# Patient Record
Sex: Male | Born: 1960 | Hispanic: Yes | Smoking: Current every day smoker
Health system: Southern US, Community
[De-identification: ages and names within clinical notes are randomized; demographics above are authoritative.]

## PROBLEM LIST (undated history)

## (undated) DIAGNOSIS — F1010 Alcohol abuse, uncomplicated: Secondary | ICD-10-CM

## (undated) DIAGNOSIS — I2699 Other pulmonary embolism without acute cor pulmonale: Secondary | ICD-10-CM

## (undated) DIAGNOSIS — K7460 Unspecified cirrhosis of liver: Secondary | ICD-10-CM

## (undated) DIAGNOSIS — K766 Portal hypertension: Secondary | ICD-10-CM

## (undated) DIAGNOSIS — K652 Spontaneous bacterial peritonitis: Secondary | ICD-10-CM

## (undated) DIAGNOSIS — I509 Heart failure, unspecified: Secondary | ICD-10-CM

## (undated) DIAGNOSIS — A419 Sepsis, unspecified organism: Secondary | ICD-10-CM

## (undated) DIAGNOSIS — H269 Unspecified cataract: Secondary | ICD-10-CM

## (undated) DIAGNOSIS — R188 Other ascites: Secondary | ICD-10-CM

## (undated) DIAGNOSIS — L0390 Cellulitis, unspecified: Secondary | ICD-10-CM

## (undated) DIAGNOSIS — K7031 Alcoholic cirrhosis of liver with ascites: Secondary | ICD-10-CM

## (undated) DIAGNOSIS — E119 Type 2 diabetes mellitus without complications: Secondary | ICD-10-CM

## (undated) HISTORY — DX: Alcoholic cirrhosis of liver with ascites: K70.31

## (undated) HISTORY — DX: Type 2 diabetes mellitus without complications: E11.9

## (undated) HISTORY — PX: EYE SURGERY: SHX253

## (undated) HISTORY — DX: Unspecified cirrhosis of liver: K74.60

## (undated) HISTORY — PX: HERNIA REPAIR: SHX51

## (undated) HISTORY — DX: Other pulmonary embolism without acute cor pulmonale: I26.99

## (undated) HISTORY — PX: CATARACT EXTRACTION: SUR2

## (undated) HISTORY — DX: Heart failure, unspecified: I50.9

---

## 2005-11-10 ENCOUNTER — Inpatient Hospital Stay: Payer: Self-pay | Admitting: Unknown Physician Specialty

## 2014-12-29 DIAGNOSIS — Z8614 Personal history of Methicillin resistant Staphylococcus aureus infection: Secondary | ICD-10-CM

## 2014-12-29 HISTORY — DX: Personal history of Methicillin resistant Staphylococcus aureus infection: Z86.14

## 2015-03-06 ENCOUNTER — Ambulatory Visit: Payer: Self-pay | Admitting: Family Medicine

## 2015-08-27 DIAGNOSIS — D7589 Other specified diseases of blood and blood-forming organs: Secondary | ICD-10-CM | POA: Insufficient documentation

## 2018-09-28 DIAGNOSIS — S63259A Unspecified dislocation of unspecified finger, initial encounter: Secondary | ICD-10-CM | POA: Insufficient documentation

## 2019-12-30 DIAGNOSIS — J189 Pneumonia, unspecified organism: Secondary | ICD-10-CM

## 2019-12-30 HISTORY — DX: Pneumonia, unspecified organism: J18.9

## 2019-12-30 HISTORY — PX: PARACENTESIS: SHX844

## 2020-06-05 ENCOUNTER — Emergency Department: Payer: Self-pay

## 2020-06-05 ENCOUNTER — Inpatient Hospital Stay (HOSPITAL_COMMUNITY)
Admit: 2020-06-05 | Discharge: 2020-06-05 | Disposition: A | Discharge status: Home / Self Care | Payer: Self-pay | Attending: Internal Medicine | Admitting: Internal Medicine

## 2020-06-05 ENCOUNTER — Other Ambulatory Visit: Payer: Self-pay

## 2020-06-05 ENCOUNTER — Encounter: Payer: Self-pay | Admitting: Emergency Medicine

## 2020-06-05 ENCOUNTER — Inpatient Hospital Stay
Admission: EM | Admit: 2020-06-05 | Discharge: 2020-06-21 | DRG: 871 | Disposition: A | Discharge status: Home Health | Payer: Self-pay | Attending: Internal Medicine | Admitting: Internal Medicine

## 2020-06-05 ENCOUNTER — Inpatient Hospital Stay: Payer: Self-pay

## 2020-06-05 ENCOUNTER — Ambulatory Visit
Admission: EM | Admit: 2020-06-05 | Discharge: 2020-06-05 | Disposition: A | Discharge status: Other Acute Inpatient Hospital | Payer: PRIVATE HEALTH INSURANCE | Attending: Emergency Medicine | Admitting: Emergency Medicine

## 2020-06-05 ENCOUNTER — Encounter: Payer: Self-pay | Admitting: Internal Medicine

## 2020-06-05 DIAGNOSIS — A419 Sepsis, unspecified organism: Principal | ICD-10-CM | POA: Diagnosis present

## 2020-06-05 DIAGNOSIS — R0902 Hypoxemia: Secondary | ICD-10-CM

## 2020-06-05 DIAGNOSIS — R188 Other ascites: Secondary | ICD-10-CM

## 2020-06-05 DIAGNOSIS — E873 Alkalosis: Secondary | ICD-10-CM | POA: Diagnosis not present

## 2020-06-05 DIAGNOSIS — K7031 Alcoholic cirrhosis of liver with ascites: Secondary | ICD-10-CM | POA: Diagnosis present

## 2020-06-05 DIAGNOSIS — J440 Chronic obstructive pulmonary disease with acute lower respiratory infection: Secondary | ICD-10-CM | POA: Diagnosis present

## 2020-06-05 DIAGNOSIS — I5033 Acute on chronic diastolic (congestive) heart failure: Secondary | ICD-10-CM | POA: Diagnosis present

## 2020-06-05 DIAGNOSIS — K652 Spontaneous bacterial peritonitis: Secondary | ICD-10-CM | POA: Diagnosis present

## 2020-06-05 DIAGNOSIS — R0602 Shortness of breath: Secondary | ICD-10-CM

## 2020-06-05 DIAGNOSIS — K7681 Hepatopulmonary syndrome: Secondary | ICD-10-CM | POA: Diagnosis present

## 2020-06-05 DIAGNOSIS — R079 Chest pain, unspecified: Secondary | ICD-10-CM | POA: Diagnosis present

## 2020-06-05 DIAGNOSIS — R609 Edema, unspecified: Secondary | ICD-10-CM

## 2020-06-05 DIAGNOSIS — J9601 Acute respiratory failure with hypoxia: Secondary | ICD-10-CM

## 2020-06-05 DIAGNOSIS — D72829 Elevated white blood cell count, unspecified: Secondary | ICD-10-CM

## 2020-06-05 DIAGNOSIS — R600 Localized edema: Secondary | ICD-10-CM

## 2020-06-05 DIAGNOSIS — Z515 Encounter for palliative care: Secondary | ICD-10-CM | POA: Diagnosis not present

## 2020-06-05 DIAGNOSIS — L03115 Cellulitis of right lower limb: Secondary | ICD-10-CM

## 2020-06-05 DIAGNOSIS — M7989 Other specified soft tissue disorders: Secondary | ICD-10-CM

## 2020-06-05 DIAGNOSIS — E871 Hypo-osmolality and hyponatremia: Secondary | ICD-10-CM

## 2020-06-05 DIAGNOSIS — Z6833 Body mass index (BMI) 33.0-33.9, adult: Secondary | ICD-10-CM

## 2020-06-05 DIAGNOSIS — E8809 Other disorders of plasma-protein metabolism, not elsewhere classified: Secondary | ICD-10-CM | POA: Diagnosis present

## 2020-06-05 DIAGNOSIS — J9602 Acute respiratory failure with hypercapnia: Secondary | ICD-10-CM | POA: Diagnosis present

## 2020-06-05 DIAGNOSIS — J9600 Acute respiratory failure, unspecified whether with hypoxia or hypercapnia: Secondary | ICD-10-CM

## 2020-06-05 DIAGNOSIS — I2699 Other pulmonary embolism without acute cor pulmonale: Secondary | ICD-10-CM

## 2020-06-05 DIAGNOSIS — K7040 Alcoholic hepatic failure without coma: Secondary | ICD-10-CM | POA: Diagnosis present

## 2020-06-05 DIAGNOSIS — E876 Hypokalemia: Secondary | ICD-10-CM | POA: Diagnosis not present

## 2020-06-05 DIAGNOSIS — D649 Anemia, unspecified: Secondary | ICD-10-CM | POA: Diagnosis present

## 2020-06-05 DIAGNOSIS — Z66 Do not resuscitate: Secondary | ICD-10-CM | POA: Diagnosis present

## 2020-06-05 DIAGNOSIS — F1010 Alcohol abuse, uncomplicated: Secondary | ICD-10-CM | POA: Diagnosis present

## 2020-06-05 DIAGNOSIS — I110 Hypertensive heart disease with heart failure: Secondary | ICD-10-CM | POA: Diagnosis present

## 2020-06-05 DIAGNOSIS — L03116 Cellulitis of left lower limb: Secondary | ICD-10-CM

## 2020-06-05 DIAGNOSIS — Z20822 Contact with and (suspected) exposure to covid-19: Secondary | ICD-10-CM | POA: Diagnosis present

## 2020-06-05 DIAGNOSIS — R590 Localized enlarged lymph nodes: Secondary | ICD-10-CM | POA: Diagnosis present

## 2020-06-05 DIAGNOSIS — J189 Pneumonia, unspecified organism: Secondary | ICD-10-CM | POA: Diagnosis present

## 2020-06-05 DIAGNOSIS — K921 Melena: Secondary | ICD-10-CM | POA: Diagnosis not present

## 2020-06-05 DIAGNOSIS — K7460 Unspecified cirrhosis of liver: Secondary | ICD-10-CM

## 2020-06-05 DIAGNOSIS — F17210 Nicotine dependence, cigarettes, uncomplicated: Secondary | ICD-10-CM | POA: Diagnosis present

## 2020-06-05 DIAGNOSIS — E1165 Type 2 diabetes mellitus with hyperglycemia: Secondary | ICD-10-CM

## 2020-06-05 DIAGNOSIS — R778 Other specified abnormalities of plasma proteins: Secondary | ICD-10-CM

## 2020-06-05 DIAGNOSIS — J441 Chronic obstructive pulmonary disease with (acute) exacerbation: Secondary | ICD-10-CM | POA: Diagnosis present

## 2020-06-05 HISTORY — DX: Unspecified cataract: H26.9

## 2020-06-05 LAB — HEPATITIS PANEL, ACUTE
HCV Ab: NONREACTIVE
Hep A IgM: NONREACTIVE
Hep B C IgM: NONREACTIVE
Hepatitis B Surface Ag: NONREACTIVE

## 2020-06-05 LAB — CBC WITH DIFFERENTIAL/PLATELET
Abs Immature Granulocytes: 0.54 10*3/uL — ABNORMAL HIGH (ref 0.00–0.07)
Basophils Absolute: 0.1 10*3/uL (ref 0.0–0.1)
Basophils Relative: 0 %
Eosinophils Absolute: 0 10*3/uL (ref 0.0–0.5)
Eosinophils Relative: 0 %
HCT: 34.5 % — ABNORMAL LOW (ref 39.0–52.0)
Hemoglobin: 10.9 g/dL — ABNORMAL LOW (ref 13.0–17.0)
Immature Granulocytes: 2 %
Lymphocytes Relative: 1 %
Lymphs Abs: 0.3 10*3/uL — ABNORMAL LOW (ref 0.7–4.0)
MCH: 29.5 pg (ref 26.0–34.0)
MCHC: 31.6 g/dL (ref 30.0–36.0)
MCV: 93.2 fL (ref 80.0–100.0)
Monocytes Absolute: 1.7 10*3/uL — ABNORMAL HIGH (ref 0.1–1.0)
Monocytes Relative: 6 %
Neutro Abs: 29.2 10*3/uL — ABNORMAL HIGH (ref 1.7–7.7)
Neutrophils Relative %: 91 %
Platelets: 452 10*3/uL — ABNORMAL HIGH (ref 150–400)
RBC: 3.7 MIL/uL — ABNORMAL LOW (ref 4.22–5.81)
RDW: 14 % (ref 11.5–15.5)
Smear Review: NORMAL
WBC: 31.9 10*3/uL — ABNORMAL HIGH (ref 4.0–10.5)
nRBC: 0.2 % (ref 0.0–0.2)

## 2020-06-05 LAB — MAGNESIUM: Magnesium: 1.7 mg/dL (ref 1.7–2.4)

## 2020-06-05 LAB — C-REACTIVE PROTEIN: CRP: 7.1 mg/dL — ABNORMAL HIGH (ref <1.0)

## 2020-06-05 LAB — BLOOD GAS, VENOUS
Acid-Base Excess: 0.2 mmol/L (ref 0.0–2.0)
Bicarbonate: 26 mmol/L (ref 20.0–28.0)
O2 Saturation: 78.7 %
Patient temperature: 37
pCO2, Ven: 46 mmHg (ref 44.0–60.0)
pH, Ven: 7.36 (ref 7.250–7.430)
pO2, Ven: 45 mmHg (ref 32.0–45.0)

## 2020-06-05 LAB — PHOSPHORUS: Phosphorus: 2.4 mg/dL — ABNORMAL LOW (ref 2.5–4.6)

## 2020-06-05 LAB — TROPONIN I (HIGH SENSITIVITY)
Troponin I (High Sensitivity): 27 ng/L — ABNORMAL HIGH (ref <18)
Troponin I (High Sensitivity): 23 ng/L — ABNORMAL HIGH (ref <18)
Troponin I (High Sensitivity): 18 ng/L — ABNORMAL HIGH (ref <18)
Troponin I (High Sensitivity): 15 ng/L (ref <18)

## 2020-06-05 LAB — BASIC METABOLIC PANEL
Anion gap: 7 (ref 5–15)
BUN: 9 mg/dL (ref 6–20)
CO2: 25 mmol/L (ref 22–32)
Calcium: 6.8 mg/dL — ABNORMAL LOW (ref 8.9–10.3)
Chloride: 97 mmol/L — ABNORMAL LOW (ref 98–111)
Creatinine, Ser: 0.83 mg/dL (ref 0.61–1.24)
GFR calc Af Amer: >60 mL/min (ref >60)
GFR calc non Af Amer: >60 mL/min (ref >60)
Glucose, Bld: 183 mg/dL — ABNORMAL HIGH (ref 70–99)
Potassium: 4 mmol/L (ref 3.5–5.1)
Sodium: 129 mmol/L — ABNORMAL LOW (ref 135–145)

## 2020-06-05 LAB — HEPATIC FUNCTION PANEL
ALT: 22 U/L (ref 0–44)
AST: 25 U/L (ref 15–41)
Albumin: 1.7 g/dL — ABNORMAL LOW (ref 3.5–5.0)
Alkaline Phosphatase: 59 U/L (ref 38–126)
Bilirubin, Direct: 0.2 mg/dL (ref 0.0–0.2)
Indirect Bilirubin: 0.8 mg/dL (ref 0.3–0.9)
Total Bilirubin: 1 mg/dL (ref 0.3–1.2)
Total Protein: 5.7 g/dL — ABNORMAL LOW (ref 6.5–8.1)

## 2020-06-05 LAB — SEDIMENTATION RATE: Sed Rate: 60 mm/h — ABNORMAL HIGH (ref 0–20)

## 2020-06-05 LAB — ECHOCARDIOGRAM COMPLETE
Height: 69 in
Weight: 3472 [oz_av]

## 2020-06-05 LAB — ALBUMIN: Albumin: 1.7 g/dL — ABNORMAL LOW (ref 3.5–5.0)

## 2020-06-05 LAB — TSH: TSH: 0.773 u[IU]/mL (ref 0.350–4.500)

## 2020-06-05 LAB — SARS CORONAVIRUS 2 BY RT PCR (HOSPITAL ORDER, PERFORMED IN ~~LOC~~ HOSPITAL LAB): SARS Coronavirus 2: NEGATIVE

## 2020-06-05 LAB — MRSA PCR SCREENING: MRSA by PCR: NEGATIVE

## 2020-06-05 LAB — BRAIN NATRIURETIC PEPTIDE: B Natriuretic Peptide: 100.4 pg/mL — ABNORMAL HIGH (ref 0.0–100.0)

## 2020-06-05 LAB — GLUCOSE, CAPILLARY: Glucose-Capillary: 155 mg/dL — ABNORMAL HIGH (ref 70–99)

## 2020-06-05 LAB — HIV ANTIBODY (ROUTINE TESTING W REFLEX): HIV Screen 4th Generation wRfx: NONREACTIVE

## 2020-06-05 LAB — LACTIC ACID, PLASMA: Lactic Acid, Venous: 1.5 mmol/L (ref 0.5–1.9)

## 2020-06-05 LAB — PROCALCITONIN: Procalcitonin: 3.68 ng/mL

## 2020-06-05 MED ORDER — FOLIC ACID 1 MG PO TABS
1.0000 mg | ORAL_TABLET | Freq: Every day | ORAL | Status: DC
  Administered 2020-06-06 – 2020-06-21 (×16): 1 mg via ORAL
  Filled 2020-06-05 (×16): qty 1

## 2020-06-05 MED ORDER — FOLIC ACID 1 MG PO TABS: 1.0000 mg | ORAL_TABLET | Freq: Every day | ORAL | Status: DC

## 2020-06-05 MED ORDER — ADULT MULTIVITAMIN W/MINERALS CH: 1.0000 | ORAL_TABLET | Freq: Every day | ORAL | Status: DC

## 2020-06-05 MED ORDER — PNEUMOCOCCAL VAC POLYVALENT 25 MCG/0.5ML IJ INJ: 0.5000 mL | INJECTION | INTRAMUSCULAR | Status: DC

## 2020-06-05 MED ORDER — ONDANSETRON HCL 4 MG/2ML IJ SOLN: 4.0000 mg | Freq: Four times a day (QID) | INTRAMUSCULAR | Status: DC | PRN

## 2020-06-05 MED ORDER — NITROGLYCERIN 0.4 MG SL SUBL: 0.4000 mg | SUBLINGUAL_TABLET | SUBLINGUAL | Status: DC | PRN

## 2020-06-05 MED ORDER — MORPHINE SULFATE (PF) 2 MG/ML IV SOLN: 2.0000 mg | INTRAVENOUS | Status: DC | PRN

## 2020-06-05 MED ORDER — VANCOMYCIN HCL IN DEXTROSE 1-5 GM/200ML-% IV SOLN
1000.0000 mg | Freq: Once | INTRAVENOUS | Status: AC
  Administered 2020-06-05: 1000 mg via INTRAVENOUS
  Filled 2020-06-05: qty 200

## 2020-06-05 MED ORDER — LORAZEPAM 1 MG PO TABS: 1.0000 mg | ORAL_TABLET | ORAL | Status: AC | PRN

## 2020-06-05 MED ORDER — LORAZEPAM 2 MG/ML IJ SOLN: 1.0000 mg | INTRAMUSCULAR | Status: DC | PRN

## 2020-06-05 MED ORDER — ACETAMINOPHEN 325 MG PO TABS: 650.0000 mg | ORAL_TABLET | Freq: Four times a day (QID) | ORAL | Status: DC | PRN

## 2020-06-05 MED ORDER — IOHEXOL 350 MG/ML SOLN
75.0000 mL | Freq: Once | INTRAVENOUS | Status: AC | PRN
  Administered 2020-06-14: 75 mL via INTRAVENOUS

## 2020-06-05 MED ORDER — ACETAMINOPHEN 650 MG RE SUPP: 650.0000 mg | Freq: Four times a day (QID) | RECTAL | Status: DC | PRN

## 2020-06-05 MED ORDER — THIAMINE HCL 100 MG/ML IJ SOLN
100.0000 mg | Freq: Every day | INTRAMUSCULAR | Status: DC
  Administered 2020-06-05 – 2020-06-10 (×4): 100 mg via INTRAVENOUS
  Filled 2020-06-05 (×6): qty 2

## 2020-06-05 MED ORDER — LORAZEPAM 2 MG/ML IJ SOLN: 0.0000 mg | Freq: Four times a day (QID) | INTRAMUSCULAR | Status: AC

## 2020-06-05 MED ORDER — THIAMINE HCL 100 MG PO TABS: 100.0000 mg | ORAL_TABLET | Freq: Every day | ORAL | Status: DC

## 2020-06-05 MED ORDER — PIPERACILLIN-TAZOBACTAM 3.375 G IVPB 30 MIN
3.3750 g | Freq: Once | INTRAVENOUS | Status: AC
  Administered 2020-06-05: 3.375 g via INTRAVENOUS
  Filled 2020-06-05: qty 50

## 2020-06-05 MED ORDER — ENOXAPARIN SODIUM 40 MG/0.4ML ~~LOC~~ SOLN
40.0000 mg | SUBCUTANEOUS | Status: DC
  Administered 2020-06-06: 40 mg via SUBCUTANEOUS
  Filled 2020-06-05: qty 0.4

## 2020-06-05 MED ORDER — THIAMINE HCL 100 MG/ML IJ SOLN: 100.0000 mg | Freq: Every day | INTRAMUSCULAR | Status: DC

## 2020-06-05 MED ORDER — ONDANSETRON HCL 4 MG PO TABS: 4.0000 mg | ORAL_TABLET | Freq: Four times a day (QID) | ORAL | Status: DC | PRN

## 2020-06-05 MED ORDER — LORAZEPAM 2 MG/ML IJ SOLN: 1.0000 mg | INTRAMUSCULAR | Status: AC | PRN

## 2020-06-05 MED ORDER — ADULT MULTIVITAMIN W/MINERALS CH
1.0000 | ORAL_TABLET | Freq: Every day | ORAL | Status: DC
  Administered 2020-06-06 – 2020-06-21 (×16): 1 via ORAL
  Filled 2020-06-05 (×15): qty 1

## 2020-06-05 MED ORDER — IPRATROPIUM-ALBUTEROL 0.5-2.5 (3) MG/3ML IN SOLN
3.0000 mL | Freq: Once | RESPIRATORY_TRACT | Status: AC
  Administered 2020-06-05: 3 mL via RESPIRATORY_TRACT
  Filled 2020-06-05: qty 3

## 2020-06-05 MED ORDER — DM-GUAIFENESIN ER 30-600 MG PO TB12: 1.0000 | ORAL_TABLET | Freq: Two times a day (BID) | ORAL | Status: DC | PRN

## 2020-06-05 MED ORDER — SODIUM CHLORIDE 0.9 % IV SOLN
1.0000 g | INTRAVENOUS | Status: DC
  Filled 2020-06-05: qty 10

## 2020-06-05 MED ORDER — IPRATROPIUM-ALBUTEROL 0.5-2.5 (3) MG/3ML IN SOLN
3.0000 mL | RESPIRATORY_TRACT | Status: DC
  Administered 2020-06-05 – 2020-06-06 (×5): 3 mL via RESPIRATORY_TRACT
  Filled 2020-06-05 (×5): qty 3

## 2020-06-05 MED ORDER — ASPIRIN EC 81 MG PO TBEC
81.0000 mg | DELAYED_RELEASE_TABLET | Freq: Every day | ORAL | Status: DC
  Administered 2020-06-06 – 2020-06-21 (×15): 81 mg via ORAL
  Filled 2020-06-05 (×16): qty 1

## 2020-06-05 MED ORDER — CALCIUM GLUCONATE-NACL 2-0.675 GM/100ML-% IV SOLN: 2.0000 g | Freq: Once | INTRAVENOUS | Status: DC

## 2020-06-05 MED ORDER — IOHEXOL 350 MG/ML SOLN
100.0000 mL | Freq: Once | INTRAVENOUS | Status: AC | PRN
  Administered 2020-06-05: 100 mL via INTRAVENOUS

## 2020-06-05 MED ORDER — LORAZEPAM 2 MG/ML IJ SOLN
0.0000 mg | Freq: Two times a day (BID) | INTRAMUSCULAR | Status: AC
  Administered 2020-06-08: 1 mg via INTRAVENOUS
  Filled 2020-06-05: qty 1

## 2020-06-05 MED ORDER — THIAMINE HCL 100 MG PO TABS
100.0000 mg | ORAL_TABLET | Freq: Every day | ORAL | Status: DC
  Administered 2020-06-05 – 2020-06-21 (×16): 100 mg via ORAL
  Filled 2020-06-05 (×16): qty 1

## 2020-06-05 MED ORDER — ALBUTEROL SULFATE (2.5 MG/3ML) 0.083% IN NEBU
2.5000 mg | INHALATION_SOLUTION | RESPIRATORY_TRACT | Status: DC | PRN
  Administered 2020-06-10: 2.5 mg via RESPIRATORY_TRACT
  Filled 2020-06-05: qty 3

## 2020-06-05 NOTE — Consult Note (Addendum)
Name: Collin Byrd MRN: [id number] DOB: April 11, 1961     CONSULTATION DATE: 06/05/2020  REFERRING MD : Blaine Hamper  CHIEF COMPLAINT: SOB  STUDIES:    6/8  CXR independently reviewed by Me B/l ILD, opacities   HISTORY OF PRESENT ILLNESS:  59 y.o. male with no known or listed past medical history who presents today for progressive shortness of breath x 2 weeks, lower ext swelling for 5 months  Patient arrives from an urgent care stating shortness of breath has been worsening over at least weeks. patient seem to think this may have started after a tetanus shot he received months ago.    He describes no fever, has had chest pain and shortness of breath, occasional vomiting and diarrhea.   He was hypoxic on room air on EMS arrival.  He arrives with sats in the 90s on nonrebreather, patient was placed on BiPAP, admitted to step down for further assessment  Patient is alert and awake, weaned off biPAP since arrival to ICU, VS stable Patient is able to speak in sentences without issues  PAST MEDICAL HISTORY :   has a past medical history of Cataract.  has a past surgical history that includes Cataract extraction. Prior to Admission medications   Not on File   No Known Allergies  FAMILY HISTORY:  HTN  SOCIAL HISTORY:  reports that he has never smoked. He has never used smokeless tobacco. He reports previous alcohol use. He reports that he does not use drugs.  +SMOKER +ETOH USE 2 beers per day  Review of Systems:  Gen:  weakness HEENT: Denies blurred vision, double vision, ear pain, eye pain, hearing loss, nose bleeds, sore throat Cardiac:  No dizziness, chest pain or heaviness, chest tightness,edema, No JVD Resp:   No cough, -sputum production, +shortness of breath,-wheezing, -hemoptysis,  Gi: Denies swallowing difficulty, stomach pain, nausea or vomiting, diarrhea, constipation, bowel incontinence Gu:  Denies bladder incontinence, burning urine Ext:   +swelling Skin: Denies   skin rash, easy bruising or bleeding or hives Endoc:  Denies polyuria, polydipsia , polyphagia or weight change Psych:   Denies depression, insomnia or hallucinations  Other:  All other systems negative     VITAL SIGNS: Temp:  [98 F (36.7 C)-99 F (37.2 C)] 99 F (37.2 C) (06/08 1400) Pulse Rate:  [101-147] 101 (06/08 1443) Resp:  [16-35] 16 (06/08 1443) BP: (104-128)/(62-82) 115/74 (06/08 1400) SpO2:  [80 %-99 %] 94 % (06/08 1443) Weight:  [98.4 kg-103.3 kg] 103.3 kg (06/08 1400)     SpO2: 94 % O2 Flow Rate (L/min): 3 L/min    Physical Examination:   GENERAL:+ fatigue HEAD: Normocephalic, atraumatic.  EYES: PERLA, EOMI No scleral icterus.  MOUTH: Moist mucosal membrane.  EAR, NOSE, THROAT: Clear without exudates. No external lesions.  NECK: Supple.  PULMONARY: CTA B/L no wheezing, rhonchi, crackles CARDIOVASCULAR: S1 and S2. Regular rate and rhythm. No murmurs GASTROINTESTINAL: Soft, nontender, +distended. Positive bowel sounds.  GU +SCROTAL EDEMA MUSCULOSKELETAL: +edema.  NEUROLOGIC: No gross focal neurological deficits. 5/5 strength all extremities SKIN: No ulceration, lesions, rashes, or cyanosis.  PSYCHIATRIC: Insight, judgment intact. -depression -anxiety ALL OTHER ROS ARE NEGATIVE   MEDICATIONS: I have reviewed all medications and confirmed regimen as documented    CULTURE RESULTS   Recent Results (from the past 240 hour(s))  SARS Coronavirus 2 by RT PCR (hospital order, performed in [facility] hospital lab) Nasopharyngeal Nasopharyngeal Swab     Status: None   Collection Time: 06/05/20  9:33 AM  Specimen: Nasopharyngeal Swab  Result Value Ref Range Status   SARS Coronavirus 2 NEGATIVE NEGATIVE Final    Comment: (NOTE) SARS-CoV-2 target nucleic acids are NOT DETECTED. The SARS-CoV-2 RNA is generally detectable in upper and lower respiratory specimens during the acute phase of infection. The lowest concentration of SARS-CoV-2 viral copies this  assay can detect is 250 copies / mL. A negative result does not preclude SARS-CoV-2 infection and should not be used as the sole basis for treatment or other patient management decisions.  A negative result may occur with improper specimen collection / handling, submission of specimen other than nasopharyngeal swab, presence of viral mutation(s) within the areas targeted by this assay, and inadequate number of viral copies (<250 copies / mL). A negative result must be combined with clinical observations, patient history, and epidemiological information. Fact Sheet for Patients:   StrictlyIdeas.no Fact Sheet for Healthcare Providers: BankingDealers.co.za This test is not yet approved or cleared  by the [place] FDA and has been authorized for detection and/or diagnosis of SARS-CoV-2 by FDA under an Emergency Use Authorization (EUA).  This EUA will remain in effect (meaning this test can be used) for the duration of the COVID-19 declaration under Section 564(b)(1) of the Act, 21 U.S.C. section 360bbb-3(b)(1), unless the authorization is terminated or revoked sooner. Performed at [facility], [address] [place], [place] [address]           IMAGING    DG Chest Port 1 View  Result Date: 06/05/2020 CLINICAL DATA:  Shortness of breath EXAM: PORTABLE CHEST 1 VIEW COMPARISON:  None. FINDINGS: There is diffuse interstitial thickening. No airspace consolidation. No appreciable pleural effusion. Heart size and pulmonary vascularity within normal limits. No adenopathy. No bone lesions. IMPRESSION: Diffuse interstitial thickening. Suspect a degree of underlying fibrosis. Noncardiogenic pulmonary edema and atypical infection are differential considerations for this appearance. No airspace consolidation noted. Cardiac silhouette within normal limits.  No adenopathy appreciable. Electronically Signed   By: Lowella Grip III M.D.    On: 06/05/2020 10:12       ASSESSMENT AND PLAN SYNOPSIS  [name] with acute hypoxia with acute interstitial infiltrates with abd distention and lower ext edema, findings are concerning for progressive liver failure with possible systolic heart failure  Severe ACUTE Hypoxic and Hypercapnic Respiratory Failure Wean off oxygen as tolerated biPAP as needed  B/l infiltrates edema, ILD Check ECHO Consider lasix  ADB distention with scrotal edema and LE edema with ETOH Abuse Findings suggest Liver disease and Cirrhosis Check HEP panel Check CT ABD  ETOH abuse CIWA protocol  ELECTROLYTES -follow labs as needed -replace as needed -pharmacy consultation and following  LE cellulitis Continue IV abx Check cultures    DVT/GI PRX ordered and assessed TRANSFUSIONS AS NEEDED MONITOR FSBS I Assessed the need for Labs I Assessed the need for Foley I Assessed the need for Central Venous Line Family Discussion when available I Assessed the need for Mobilization I made an Assessment of medications to be adjusted accordingly Safety Risk assessment completed  CASE DISCUSSED IN MULTIDISCIPLINARY ROUNDS WITH ICU TEAM   Saladin Petrelli Patricia Pesa, M.D.  [facility]  Medical Director [place] Director [place] Department

## 2020-06-05 NOTE — ED Provider Notes (Addendum)
[facility] ____________________________________________  Time seen: Approximately 9:00 AM  I have reviewed the triage vital signs and the nursing notes.   HISTORY  Chief Complaint No chief complaint on file.   HPI Collin Byrd is a 59 y.o. male presenting with significant other at bedside for evaluation of shortness of breath.  Patient and significant other reports that this has been an ongoing issue for the last several months, gradually worsening not acutely.  Reports he has been having bilateral lower leg swelling and difficulty catching his breath.  Patient states he is not in any pain at this time.  Patient states that he noticed his leg starting to swell after he had to get a tetanus shot from stepping on a nail, significant other disagreed with this.  Denies any chest pain or shortness of breath.  Intermittent dizziness, particularly with movement.  Denies any cardiac or respiratory history.  Occasional smoker.  No recent fevers or travel.  No vomiting or diarrhea.   History reviewed. No pertinent past medical history.  There are no problems to display for this patient.   History reviewed. No pertinent surgical history.   No current facility-administered medications for this encounter. No current outpatient medications on file.  Allergies Patient has no known allergies.  No family history on file.  Social History Social History   Tobacco Use  . Smoking status: Never Smoker  . Smokeless tobacco: Never Used  Substance Use Topics  . Alcohol use: Not Currently  . Drug use: Never    Review of Systems Constitutional: No fever Cardiovascular: Denies chest pain. Respiratory: Positive short of breath. Gastrointestinal: No abdominal pain.  Musculoskeletal: Positive leg swelling.  Skin: Negative for rash.   ____________________________________________   PHYSICAL EXAM:  VITAL SIGNS: ED Triage Vitals  Enc Vitals Group     BP --      Pulse  Rate 06/05/20 0846 (!) 147     Resp 06/05/20 0846 (!) 24     Temp 06/05/20 0846 98 F (36.7 C)     Temp Source 06/05/20 0846 Oral     SpO2 06/05/20 0844 (!) 80 %     Weight --      Height --      Head Circumference --      Peak Flow --      Pain Score --      Pain Loc --      Pain Edu? --      Excl. in [place]? --    Vitals:   06/05/20 0844 06/05/20 0846 06/05/20 0854  Pulse:  (!) 147   Resp:  (!) 24   Temp:  98 F (36.7 C)   TempSrc:  Oral   SpO2: (!) 80% (!) 80% 95%     Constitutional: Alert and oriented. Well appearing and in no acute distress. ENT      Head: Normocephalic Cardiovascular: Tachycardic. Respiratory: Diffuse rhonchi.  Tachypneic. Speaking and 4-5 word sentences. Musculoskeletal: Significant diffuse bilateral lower extremity edema. Neurologic:  Normal speech and language. Speech is normal. No gait instability.  Skin:  Skin is warm, dry. Psychiatric: Mood and affect are normal. Speech and behavior are normal. Patient exhibits appropriate insight and judgment   ___________________________________________   LABS (all labs ordered are listed, but only abnormal results are displayed)  Labs Reviewed - No data to display ____________________________________________  EKG  ED ECG REPORT I, Marylene Land, the attending provider, personally viewed and interpreted this ECG.   Date: 06/05/2020  EKG  Time: 0851  Rate: 146  Rhythm:sinus tachycardia with occasional pvc  Intervals:none  ST&T Change: none noted  No comparison found   PROCEDURES Procedures    INITIAL IMPRESSION / ASSESSMENT AND PLAN / ED COURSE  Pertinent labs & imaging results that were available during my care of the patient were reviewed by me and considered in my medical decision making (see chart for details).  Patient presenting for acute shortness of breath.  Patient hypoxic, visibly short of breath with diffuse extremity edema.  Discussed multiple differentials, CHF respiratory  failure.  Recommend for EMS to transfer to patient to ER, patient agreed.  Heather charge nurse [facility] called and given report.  Patient placed on nonrebreather with oxygen then rose to 95%.  IV established. ____________________________________________   FINAL CLINICAL IMPRESSION(S) / ED DIAGNOSES  Final diagnoses:  SOB (shortness of breath)  Hypoxia  Edema, unspecified type     ED Discharge Orders    None       Note: This dictation was prepared with Dragon dictation along with smaller phrase technology. Any transcriptional errors that result from this process are unintentional.        Marylene Land, NP 06/05/20 1036

## 2020-06-05 NOTE — ED Provider Notes (Signed)
ER Provider Note       Time seen: 9:37 AM   Level V caveat: History/ROS limited by respiratory distress I have reviewed the vital signs and the nursing notes.  HISTORY Chief Complaint Shortness of Breath   HPI Collin Byrd is a 59 y.o. male with no known or listed past medical history who presents today for shortness of breath.  Patient arrives from an urgent care stating shortness of breath has been worsening over at least weeks.  He has noted some edema as well.  Patient seem to think this may have started after a tetanus shot he received months ago.  He describes no fever, has had chest pain and shortness of breath, occasional vomiting and diarrhea.  He was hypoxic on room air on EMS arrival.  He arrives with sats in the 90s on nonrebreather.  No past medical history on file.  No past surgical history on file.  Allergies Patient has no known allergies.  Review of Systems Constitutional: Negative for fever. Cardiovascular: Positive for chest pain Respiratory: Positive for shortness of breath Gastrointestinal: Negative for abdominal pain, positive for vomiting diarrhea Musculoskeletal: Negative for back pain. Skin: Positive for rash on the lower extremities Neurological: Negative for headaches, focal weakness or numbness.  All systems negative/normal/unremarkable except as stated in the HPI  ____________________________________________   PHYSICAL EXAM:  VITAL SIGNS: Vitals:   06/05/20 0934  BP: 128/82  Pulse: (!) 132  Resp: (!) 23  Temp: 98.3 F (36.8 C)  SpO2: 98%    Constitutional: Alert and oriented.  Moderate distress Eyes: Conjunctivae are normal. Normal extraocular movements. ENT      Head: Normocephalic and atraumatic.      Nose: No congestion/rhinnorhea.      Mouth/Throat: Mucous membranes are moist.      Neck: No stridor. Cardiovascular: Rapid rate, regular rhythm. No murmurs, rubs, or gallops. Respiratory: Tachypnea with rhonchi  bilaterally Gastrointestinal: Distended, possible ascites.  Normal bowel sounds. Musculoskeletal: Limited range of motion, bilateral pitting edema is noted with tibial erythema bilaterally Neurologic:  Normal speech and language. No gross focal neurologic deficits are appreciated.  Skin:  Skin is warm, with bilateral lower extremity erythema and edema Psychiatric: Speech and behavior are normal.  ____________________________________________  EKG: Interpreted by me.  Sinus tachycardia with rate of 133 bpm, normal PR interval, normal QRS, normal QT  ____________________________________________   LABS (pertinent positives/negatives)  Labs Reviewed  CBC WITH DIFFERENTIAL/PLATELET - Abnormal; Notable for the following components:      Result Value   WBC 31.9 (*)    RBC 3.70 (*)    Hemoglobin 10.9 (*)    HCT 34.5 (*)    Platelets 452 (*)    Neutro Abs 29.2 (*)    Lymphs Abs 0.3 (*)    Monocytes Absolute 1.7 (*)    Abs Immature Granulocytes 0.54 (*)    All other components within normal limits  BASIC METABOLIC PANEL - Abnormal; Notable for the following components:   Sodium 129 (*)    Chloride 97 (*)    Glucose, Bld 183 (*)    Calcium 6.8 (*)    All other components within normal limits  BRAIN NATRIURETIC PEPTIDE - Abnormal; Notable for the following components:   B Natriuretic Peptide 100.4 (*)    All other components within normal limits  TROPONIN I (HIGH SENSITIVITY) - Abnormal; Notable for the following components:   Troponin I (High Sensitivity) 27 (*)    All other components within normal limits  SARS CORONAVIRUS 2 BY RT PCR (HOSPITAL ORDER, [place] [facility])  CULTURE, BLOOD (ROUTINE X 2)  CULTURE, BLOOD (ROUTINE X 2)  BLOOD GAS, VENOUS  LACTIC ACID, PLASMA  HEPATIC FUNCTION PANEL   CRITICAL CARE Performed by: Laurence Aly   Total critical care time: 30 minutes  Critical care time was exclusive of separately billable procedures and  treating other patients.  Critical care was necessary to treat or prevent imminent or life-threatening deterioration.  Critical care was time spent personally by me on the following activities: development of treatment plan with patient and/or surrogate as well as nursing, discussions with consultants, evaluation of patient's response to treatment, examination of patient, obtaining history from patient or surrogate, ordering and performing treatments and interventions, ordering and review of laboratory studies, ordering and review of radiographic studies, pulse oximetry and re-evaluation of patient's condition.  RADIOLOGY  Images were viewed by me Chest x-ray IMPRESSION: Diffuse interstitial thickening. Suspect a degree of underlying fibrosis. Noncardiogenic pulmonary edema and atypical infection are differential considerations for this appearance. No airspace consolidation noted.  Cardiac silhouette within normal limits.  No adenopathy appreciable.   DIFFERENTIAL DIAGNOSIS  CHF, COPD, pneumonia, COVID-19, respiratory failure  ASSESSMENT AND PLAN  Respiratory distress, community-acquired pneumonia   Plan: The patient had presented for respiratory distress on a nonrebreather. Patient's labs revealed significant leukocytosis but otherwise nonspecific findings.  X-rays were concerning for fibrosis versus edema versus atypical infection.  Given his leukocytosis he was covered for sepsis with vancomycin and Zosyn.  I am assuming this is some atypical pneumonia.  Covid testing was negative.  There were no overwhelming signs of congestive heart failure.  He may require CT imaging of his chest at some point.  He has improved dramatically on BiPAP.  I will discuss with the hospitalist for admission.  Lenise Arena MD    Note: This note was generated in part or whole with voice recognition software. Voice recognition is usually quite accurate but there are transcription errors that can  and very often do occur. I apologize for any typographical errors that were not detected and corrected.     Earleen Newport, MD 06/05/20 [phone]

## 2020-06-05 NOTE — H&P (Addendum)
History and Physical    Collin Byrd BXU:[id number] DOB: [birth year] DOA: 06/05/2020  Referring MD/NP/PA:   PCP: Patient, No Pcp Per   Patient coming from:  The patient is coming from home.  At baseline, pt is independent for most of ADL.        Chief Complaint: Shortness breath, chest pain, bilateral leg edema and leg pain  HPI: Collin Byrd is a 59 y.o. male without significant past medical history except for alcohol abuse (8-12 beers/day), who presents with chest pain, shortness of breath, bilateral leg edema and leg pain.  Patient states that his shortness breath has been going for several months, which has worsened in the past several days.  He has cough with white mucus production, no fever or chills.  He also has chest pain, which is located in his central chest, dull, mild to moderate, nonradiating.  Patient also has severe bilateral leg edema with erythema and tenderness.  Denies nausea, vomiting, diarrhea, abdominal pain.  No symptoms of UTI or hematuria.  No unilateral weakness.  Patient has oxygen desaturation to 80s in ED, requiring BiPAP.   ED Course: pt was found to have troponin 27, BMP 100.4, lactic acid 1.5, negative COVID-19 PCR, sodium 129, renal function normal, liver function normal except for albumin 1.7, calcium 6.8 which is corrected to 8.64, temperature normal, blood pressure 128/82, tachycardia, tachypnea, pending CTA. Pt is admitted to stepdown.  VBG with pH 7.36, CO2 46, O2 45. Dr. Mortimer Fries of pulmonologist and Dr. Saunders Revel of cardiology were consulted.  CXR showed: 1. Diffuse interstitial thickening. Suspect a degree of underlying fibrosis. Noncardiogenic pulmonary edema and atypical infection are differential considerations for this appearance. No airspace consolidation noted. 2. Cardiac silhouette within normal limits.  No adenopathy appreciable.   Review of Systems:   General: no fevers, chills,has poor appetite, has fatigue HEENT: no blurry vision, hearing  changes or sore throat Respiratory: has dyspnea, coughing, no wheezing CV: has chest pain, no palpitations GI: no nausea, vomiting, abdominal pain, diarrhea, constipation GU: no dysuria, burning on urination, increased urinary frequency, hematuria  Ext: has leg edema Neuro: no unilateral weakness, numbness, or tingling, no vision change or hearing loss Skin: no rash, no skin tear. MSK: No muscle spasm, no deformity, no limitation of range of movement in spin Heme: No easy bruising.  Travel history: No recent long distant travel.  Allergy: No Known Allergies  Past Medical History:  Diagnosis Date  . Cataract     Past Surgical History:  Procedure Laterality Date  . CATARACT EXTRACTION      Social History:  reports that he has been smoking cigarettes. He has been smoking about 0.25 packs per day. He has never used smokeless tobacco. He reports current alcohol use of about 21.0 standard drinks of alcohol per week. He reports that he does not use drugs.  Family History: Reviewed with patient, patient states that all family members do not have significant medical issues.  Prior to Admission medications   Not on File    Physical Exam: Vitals:   06/05/20 1602 06/05/20 1700 06/05/20 1800 06/05/20 1928  BP:   110/65   Pulse: (!) 106 (!) 108 (!) 105   Resp: (!) 23 (!) 26 (!) 33   Temp:      TempSrc:      SpO2: 92% 91% 91% 92%  Weight:      Height:       General: Not in acute distress HEENT:  Eyes: PERRL, EOMI, no scleral icterus.       ENT: No discharge from the ears and nose, no pharynx injection, no tonsillar enlargement.        Neck: No JVD, no bruit, no mass felt. Heme: No neck lymph node enlargement. Cardiac: S1/S2, RRR, No murmurs, No gallops or rubs. Respiratory: Diffuse crackles bilaterally GI: Soft, nondistended, nontender, no rebound pain, no organomegaly, BS present. GU: No hematuria Ext: 3+ pitting leg edema bilaterally.  Has erythema and tenderness in both  legs.  Erythema is worse in left leg than the right. 1+DP/PT pulse bilaterally. Musculoskeletal: No joint deformities, No joint redness or warmth, no limitation of ROM in spin. Skin: No rashes.  Neuro: Alert, oriented X3, cranial nerves II-XII grossly intact, moves all extremities normally.  Psych: Patient is not psychotic, no suicidal or hemocidal ideation.  Labs on Admission: I have personally reviewed following labs and imaging studies  CBC: Recent Labs  Lab 06/05/20 0933  WBC 31.9*  NEUTROABS 29.2*  HGB 10.9*  HCT 34.5*  MCV 93.2  PLT 035*   Basic Metabolic Panel: Recent Labs  Lab 06/05/20 0933  NA 129*  K 4.0  CL 97*  CO2 25  GLUCOSE 183*  BUN 9  CREATININE 0.83  CALCIUM 6.8*  MG 1.7  PHOS 2.4*   GFR: Estimated Creatinine Clearance: 116.3 mL/min (by C-G formula based on SCr of 0.83 mg/dL). Liver Function Tests: Recent Labs  Lab 06/05/20 0933  AST 25  ALT 22  ALKPHOS 59  BILITOT 1.0  PROT 5.7*  ALBUMIN 1.7*  1.7*   No results for input(s): LIPASE, AMYLASE in the last 168 hours. No results for input(s): AMMONIA in the last 168 hours. Coagulation Profile: No results for input(s): INR, PROTIME in the last 168 hours. Cardiac Enzymes: No results for input(s): CKTOTAL, CKMB, CKMBINDEX, TROPONINI in the last 168 hours. BNP (last 3 results) No results for input(s): PROBNP in the last 8760 hours. HbA1C: No results for input(s): HGBA1C in the last 72 hours. CBG: Recent Labs  Lab 06/05/20 1446  GLUCAP 155*   Lipid Profile: No results for input(s): CHOL, HDL, LDLCALC, TRIG, CHOLHDL, LDLDIRECT in the last 72 hours. Thyroid Function Tests: Recent Labs    06/05/20 1448  TSH 0.773   Anemia Panel: No results for input(s): VITAMINB12, FOLATE, FERRITIN, TIBC, IRON, RETICCTPCT in the last 72 hours. Urine analysis: No results found for: COLORURINE, APPEARANCEUR, LABSPEC, [place], GLUCOSEU, HGBUR, BILIRUBINUR, KETONESUR, PROTEINUR, UROBILINOGEN, NITRITE,  LEUKOCYTESUR Sepsis Labs: _0 (procalcitonin:4,lacticidven:4) ) Recent Results (from the past 240 hour(s))  SARS Coronavirus 2 by RT PCR (hospital order, performed in [facility] hospital lab) Nasopharyngeal Nasopharyngeal Swab     Status: None   Collection Time: 06/05/20  9:33 AM   Specimen: Nasopharyngeal Swab  Result Value Ref Range Status   SARS Coronavirus 2 NEGATIVE NEGATIVE Final    Comment: (NOTE) SARS-CoV-2 target nucleic acids are NOT DETECTED. The SARS-CoV-2 RNA is generally detectable in upper and lower respiratory specimens during the acute phase of infection. The lowest concentration of SARS-CoV-2 viral copies this assay can detect is 250 copies / mL. A negative result does not preclude SARS-CoV-2 infection and should not be used as the sole basis for treatment or other patient management decisions.  A negative result may occur with improper specimen collection / handling, submission of specimen other than nasopharyngeal swab, presence of viral mutation(s) within the areas targeted by this assay, and inadequate number of viral copies (<250 copies / mL). A  negative result must be combined with clinical observations, patient history, and epidemiological information. Fact Sheet for Patients:   StrictlyIdeas.no Fact Sheet for Healthcare Providers: BankingDealers.co.za This test is not yet approved or cleared  by the [place] FDA and has been authorized for detection and/or diagnosis of SARS-CoV-2 by FDA under an Emergency Use Authorization (EUA).  This EUA will remain in effect (meaning this test can be used) for the duration of the COVID-19 declaration under Section 564(b)(1) of the Act, 21 U.S.C. section 360bbb-3(b)(1), unless the authorization is terminated or revoked sooner. Performed at [facility], [address] [place], [place] [address]   MRSA PCR Screening     Status: None   Collection Time:  06/05/20  2:42 PM   Specimen: Nasopharyngeal  Result Value Ref Range Status   MRSA by PCR NEGATIVE NEGATIVE Final    Comment:        The GeneXpert MRSA Assay (FDA approved for NASAL specimens only), is one component of a comprehensive MRSA colonization surveillance program. It is not intended to diagnose MRSA infection nor to guide or monitor treatment for MRSA infections. Performed at [facility], [address] [place], [place] [address]      Radiological Exams on Admission: DG Chest [facility] 1 View  Result Date: 06/05/2020 CLINICAL DATA:  Shortness of breath EXAM: PORTABLE CHEST 1 VIEW COMPARISON:  None. FINDINGS: There is diffuse interstitial thickening. No airspace consolidation. No appreciable pleural effusion. Heart size and pulmonary vascularity within normal limits. No adenopathy. No bone lesions. IMPRESSION: Diffuse interstitial thickening. Suspect a degree of underlying fibrosis. Noncardiogenic pulmonary edema and atypical infection are differential considerations for this appearance. No airspace consolidation noted. Cardiac silhouette within normal limits.  No adenopathy appreciable. Electronically Signed   By: Lowella Grip III M.D.   On: 06/05/2020 10:12   ECHOCARDIOGRAM COMPLETE  Result Date: 06/05/2020    ECHOCARDIOGRAM REPORT   Patient Name:   HISASHI AMADON Date of Exam: 06/05/2020 Medical Rec #:  [id number]        Height:       69.0 in Accession #:    [id number]       Weight:       217.0 lb Date of Birth:  [birth year]        BSA:          2.139 m Patient Age:    89 years         BP:           106/62 mmHg Patient Gender: M                HR:           106 bpm. Exam Location:  [facility] Procedure: 2D Echo, Cardiac Doppler and Color Doppler Indications:     Elevated troponin  History:         Patient has no prior history of Echocardiogram examinations. No                  cardiac history listed in chart.  Sonographer:     Sherrie Sport RDCS (AE) Referring Phys:  Baker Janus Soledad Gerlach  Jenniferlynn Saad Diagnosing Phys: Nelva Bush MD  Sonographer Comments: Technically difficult study due to poor echo windows, no parasternal window and no apical window. Pt on Bipap. IMPRESSIONS  1. Left ventricular ejection fraction, by estimation, is >55%. The left ventricle has normal function. Left ventricular endocardial border not optimally defined to evaluate regional wall motion. There is moderate left  ventricular hypertrophy. Left ventricular diastolic function could not be evaluated.  2. Right ventricular systolic function is normal. The right ventricular size is normal.  3. The mitral valve is grossly normal. No evidence of mitral valve regurgitation.  4. The aortic valve was not well visualized. Aortic valve regurgitation not well assessed.  5. Pulmonic valve regurgitation not well assessed. FINDINGS  Left Ventricle: Left ventricular ejection fraction, by estimation, is >55%. The left ventricle has normal function. Left ventricular endocardial border not optimally defined to evaluate regional wall motion. The left ventricular internal cavity size was  normal in size. There is moderate left ventricular hypertrophy. Left ventricular diastolic function could not be evaluated. Right Ventricle: The right ventricular size is normal. Right vetricular wall thickness was not assessed. Right ventricular systolic function is normal. Left Atrium: Left atrial size was not well visualized. Right Atrium: Right atrial size was not well visualized. Pericardium: A small pericardial effusion is present. Mitral Valve: The mitral valve is grossly normal. No evidence of mitral valve regurgitation. Tricuspid Valve: The tricuspid valve is not well visualized. Tricuspid valve regurgitation is trivial. Aortic Valve: The aortic valve was not well visualized. Aortic valve regurgitation not well assessed. Pulmonic Valve: The pulmonic valve was not well visualized. Pulmonic valve regurgitation not well assessed. Aorta: The aortic root was not  well visualized. Pulmonary Artery: The pulmonary artery is not well seen. Venous: The inferior vena cava was not well visualized. IAS/Shunts: The interatrial septum was not well visualized.  LEFT VENTRICLE PLAX 2D LVIDd:         4.79 cm LVIDs:         2.95 cm LV PW:         1.59 cm LV IVS:        1.36 cm  LEFT ATRIUM         Index LA diam:    3.90 cm 1.82 cm/m  TRICUSPID VALVE TR Peak grad:   16.6 mmHg TR Vmax:        204.00 cm/s Nelva Bush MD Electronically signed by Nelva Bush MD Signature Date/Time: 06/05/2020/3:42:19 PM    Final      EKG: Independently reviewed.  Sinus rhythm, tachycardia, QTC 447, LAE, early R wave progression  Assessment/Plan Principal Problem:   Acute respiratory failure with hypoxia (HCC) Active Problems:   Hyponatremia   Elevated troponin   Leukocytosis   Bilateral lower leg cellulitis   Chest pain   Sepsis ([place])   Alcohol abuse   Acute respiratory failure with hypoxia [facility]): Etiology is not clear.  Chest x-ray showed possible interstitial pulmonary fibrosis.  Patient has a severe leg edema, indicating possible CHF, but BNP is only 100.4.  2D echo was done, which showed EF >55%,  left ventricular diastolic function could not be evaluated. Pt has hypoalbuminemia with albumin 1.7 which at least partially contributed to leg edema.  Since patient has hyponatremia, will not start diuretics now.  Pulmonologist, Dr. Callie Fielding and cardiologist, Dr. Saunders Revel are consulted  -Admit to stepdown as inpatient -Continue BiPAP -Bronchodilators -Nasal cannula oxygen to maintain oxygen saturation above 93% when when patient is off BiPAP -Check urinalysis for proteinuria -CT angiogram of chest  Hyponatremia: Na 129.  Etiology is not clear.  Mental status normal.  Differential diagnosis include poor oral intake and dehydration, SIADH, potomania, thyroid dysfunction.  Due to severe fluid retention, will not give IV normal saline - Will check urine sodium, urine osmolality, serum  osmolality. - check TSH - Fluid restriction - f/u  by BMP  - avoid over correction too fast due to risk of central pontine myelinolysis  Elevated troponin and chest pain: Troponin 27. Card, Dr. Saunders Revel is consulted - Trend Trop - Repeat EKG in the am  - prn Nitroglycerin, Morphine, and aspirin - Risk factor stratification: will check FLP and A1C  - check UDS - 2d echo is done --> as above  Sepsis due to bilateral lower leg cellulitis: Patient admits critical for sepsis with leukocytosis, tachycardia and tachypnea.  Lactic acid is normal.  Hemodynamically stable -Vancomycin and Zosyn started, will switch to Rocephin -Follow-up blood culture -ESR, CRP  Alcohol abuse: -CiWA protocol     DVT ppx: SQ Lovenox Code Status: Full code Family Communication: not done, no family member is at bed side.     Disposition Plan:  Anticipate discharge back to previous environment Consults called:  Dr. Mortimer Fries of pulmonologist and Dr. Saunders Revel of cardiology were consulted. Admission status:   SDU/inpation          Status is: Inpatient  Remains inpatient appropriate because:Inpatient level of care appropriate due to severity of illness.  Patient does not have significant past medical history, but presents with several acute severe issues, including acute respiratory failure with hypoxia, requiring BiPAP, chest pain, elevated troponin, hyponatremia, bilateral lower leg cellulitis and sepsis.  His presentation is highly complicated.  Patient is at high risk of deterioration.  Need to be treated in hospital for at least 2 days.   Dispo: The patient is from: Home              Anticipated d/c is to: Home              Anticipated d/c date is: 2 days              Patient currently is not medically stable to d/c.           Date of Service 06/05/2020    [facility]   If 7PM-7AM, please contact night-coverage [email or web address] 06/05/2020, 7:43 PM

## 2020-06-05 NOTE — Consult Note (Addendum)
Cardiology Consultation:   Patient ID: Collin Byrd MRN: [id number]; DOB: [birth year]  Admit date: 06/05/2020 Date of Consult: 06/05/2020  Primary Care Provider: Patient, No Pcp Per [facility] Cardiologist: [place] Electrophysiologist:  None    Patient Profile:   Collin Byrd is a 59 y.o. male with no significant past medical history who is being seen today for the evaluation of shortness of breath and leg swelling with concern for acute heart failure at the request of Niu.  History of Present Illness:   Collin Byrd reports that he has developed progressive leg swelling over the last 3 months.  This has been accompanied by shortness of breath and constant chest pain.  He is unable to characterize the chest pain further, stating only that it has been there all the time.  It is not exacerbated or alleviated by certain activities or other factors.  He reports orthopnea but is unable to characterize its severity.  He presented to the ER today because of worsening leg swelling and pain and was found to be in significant respiratory distress, requiring BiPAP.  Chest radiograph showed bilateral interstitial opacities with normal cardiomediastinal silhouette.  At this time, Collin Byrd has been weaned to nasal cannula and reports that his breathing is much better.  He still feels that his legs are swollen.  His abdomen also seems swollen to him, though he does not recall how long this has been present.   Past Medical History:  Diagnosis Date  . Cataract     Past Surgical History:  Procedure Laterality Date  . CATARACT EXTRACTION       Home medications: None.  Inpatient Medications: Scheduled Meds: . aspirin EC  81 mg Oral Daily  . enoxaparin (LOVENOX) injection  40 mg Subcutaneous Q24H  . [START ON 05/05/5276] folic acid  1 mg Oral Daily  . ipratropium-albuterol  3 mL Nebulization Q4H  . [START ON 06/06/2020] multivitamin with minerals  1 tablet Oral Daily  .  [START ON 06/06/2020] thiamine  100 mg Oral Daily   Continuous Infusions: . cefTRIAXone (ROCEPHIN)  IV     PRN Meds: acetaminophen **OR** acetaminophen, albuterol, dextromethorphan-guaiFENesin, iohexol, LORazepam, ondansetron **OR** ondansetron (ZOFRAN) IV  Allergies:   No Known Allergies  Social History:   Social History   Tobacco Use  . Smoking status: Current Every Day Smoker    Packs/day: 0.25    Types: Cigarettes  . Smokeless tobacco: Never Used  Substance Use Topics  . Alcohol use: Yes    Alcohol/week: 21.0 standard drinks    Types: 21 Cans of beer per week  . Drug use: Never     Family History:   Family History  Problem Relation Age of Onset  . Heart disease Neg Hx      ROS:  Please see the history of present illness. All other ROS reviewed and negative.     Physical Exam/Data:   Vitals:   06/05/20 1400 06/05/20 1443 06/05/20 1500 06/05/20 1602  BP: 115/74     Pulse: (!) 101 (!) 101 (!) 102 (!) 106  Resp: (!) 35 16 (!) 22 (!) 23  Temp: 99 F (37.2 C)     TempSrc: Oral     SpO2: 99% 94% 92% 92%  Weight: 103.3 kg     Height: 5\' 9"  (1.753 m)      No intake or output data in the 24 hours ending 06/05/20 1749 Last 3 Weights 06/05/2020 06/05/2020  Weight (lbs) 227 lb 11.8 oz  217 lb  Weight (kg) 103.3 kg 98.431 kg     Body mass index is 33.63 kg/m.  General: Chronically ill-appearing man, lying in bed. HEENT: Muddy sclera with injection of the right conjunctiva noted. Lymph: no adenopathy Neck: No JVD or HJR. Endocrine:  No thryomegaly Vascular: No carotid bruits; FA pulses 2+ bilaterally without bruits  Cardiac: Tachycardic but regular with 2/6 holosystolic murmur.  No rubs or gallops. Lungs: Coarse breath sounds bilaterally, mildly diminished at the lung bases. Abd: Distended and firm abdomen without tenderness.  Unable to assess HSM Ext: 2-3+ doughy edema in both calves. Musculoskeletal:  No deformities, BUE and BLE strength normal and equal Skin: warm  and dry.  Erythema of the left shin is noted.  There are patchy erythematous areas scattered on the right shin as well. Neuro:  CNs 2-12 intact, no focal abnormalities noted Psych:  Normal affect   EKG:  The EKG was personally reviewed and demonstrates: Sinus tachycardia with nonspecific ST segment changes. Telemetry:  Telemetry was personally reviewed and demonstrates: Sinus tachycardia  Relevant CV Studies: TTE (06/05/2020): Technically difficult study with poor windows.  LVEF greater than 55% with moderate LVH.  Normal RV size and function.  Poor evaluation of the valvular structures.  Small pericardial effusion noted.  Laboratory Data:  High Sensitivity Troponin:   Recent Labs  Lab 06/05/20 0933 06/05/20 1448 06/05/20 1616  TROPONINIHS 27* 23* 18*     Chemistry Recent Labs  Lab 06/05/20 0933  NA 129*  K 4.0  CL 97*  CO2 25  GLUCOSE 183*  BUN 9  CREATININE 0.83  CALCIUM 6.8*  GFRNONAA >60  GFRAA >60  ANIONGAP 7    Recent Labs  Lab 06/05/20 0933  PROT 5.7*  ALBUMIN 1.7*  1.7*  AST 25  ALT 22  ALKPHOS 59  BILITOT 1.0   Hematology Recent Labs  Lab 06/05/20 0933  WBC 31.9*  RBC 3.70*  HGB 10.9*  HCT 34.5*  MCV 93.2  MCH 29.5  MCHC 31.6  RDW 14.0  PLT 452*   BNP Recent Labs  Lab 06/05/20 0933  BNP 100.4*    DDimer No results for input(s): DDIMER in the last 168 hours.   Radiology/Studies:  DG Chest Port 1 View  Result Date: 06/05/2020 CLINICAL DATA:  Shortness of breath EXAM: PORTABLE CHEST 1 VIEW COMPARISON:  None. FINDINGS: There is diffuse interstitial thickening. No airspace consolidation. No appreciable pleural effusion. Heart size and pulmonary vascularity within normal limits. No adenopathy. No bone lesions. IMPRESSION: Diffuse interstitial thickening. Suspect a degree of underlying fibrosis. Noncardiogenic pulmonary edema and atypical infection are differential considerations for this appearance. No airspace consolidation noted. Cardiac  silhouette within normal limits.  No adenopathy appreciable. Electronically Signed   By: Lowella Grip III M.D.   On: 06/05/2020 10:12   ECHOCARDIOGRAM COMPLETE  Result Date: 06/05/2020    ECHOCARDIOGRAM REPORT   Patient Name:   Collin Byrd Date of Exam: 06/05/2020 Medical Rec #:  [id number]        Height:       69.0 in Accession #:    [id number]       Weight:       217.0 lb Date of Birth:  01-07-1961        BSA:          2.139 m Patient Age:    69 years         BP:           106/62  mmHg Patient Gender: M                HR:           106 bpm. Exam Location:  [facility] Procedure: 2D Echo, Cardiac Doppler and Color Doppler Indications:     Elevated troponin  History:         Patient has no prior history of Echocardiogram examinations. No                  cardiac history listed in chart.  Sonographer:     Sherrie Sport RDCS (AE) Referring Phys:  Baker Janus Soledad Gerlach NIU Diagnosing Phys: Nelva Bush MD  Sonographer Comments: Technically difficult study due to poor echo windows, no parasternal window and no apical window. Pt on Bipap. IMPRESSIONS  1. Left ventricular ejection fraction, by estimation, is >55%. The left ventricle has normal function. Left ventricular endocardial border not optimally defined to evaluate regional wall motion. There is moderate left ventricular hypertrophy. Left ventricular diastolic function could not be evaluated.  2. Right ventricular systolic function is normal. The right ventricular size is normal.  3. The mitral valve is grossly normal. No evidence of mitral valve regurgitation.  4. The aortic valve was not well visualized. Aortic valve regurgitation not well assessed.  5. Pulmonic valve regurgitation not well assessed. FINDINGS  Left Ventricle: Left ventricular ejection fraction, by estimation, is >55%. The left ventricle has normal function. Left ventricular endocardial border not optimally defined to evaluate regional wall motion. The left ventricular internal cavity size was  normal in  size. There is moderate left ventricular hypertrophy. Left ventricular diastolic function could not be evaluated. Right Ventricle: The right ventricular size is normal. Right vetricular wall thickness was not assessed. Right ventricular systolic function is normal. Left Atrium: Left atrial size was not well visualized. Right Atrium: Right atrial size was not well visualized. Pericardium: A small pericardial effusion is present. Mitral Valve: The mitral valve is grossly normal. No evidence of mitral valve regurgitation. Tricuspid Valve: The tricuspid valve is not well visualized. Tricuspid valve regurgitation is trivial. Aortic Valve: The aortic valve was not well visualized. Aortic valve regurgitation not well assessed. Pulmonic Valve: The pulmonic valve was not well visualized. Pulmonic valve regurgitation not well assessed. Aorta: The aortic root was not well visualized. Pulmonary Artery: The pulmonary artery is not well seen. Venous: The inferior vena cava was not well visualized. IAS/Shunts: The interatrial septum was not well visualized.  LEFT VENTRICLE PLAX 2D LVIDd:         4.79 cm LVIDs:         2.95 cm LV PW:         1.59 cm LV IVS:        1.36 cm  LEFT ATRIUM         Index LA diam:    3.90 cm 1.82 cm/m  TRICUSPID VALVE TR Peak grad:   16.6 mmHg TR Vmax:        204.00 cm/s Nelva Bush MD Electronically signed by Nelva Bush MD Signature Date/Time: 06/05/2020/3:42:19 PM    Final     Assessment and Plan:   Acute respiratory failure with hypoxia: Etiology somewhat unclear.  I do not believe that this is driven primarily by cardiogenic pulmonary edema, based on echo findings (though study is quite limited).  Borderline elevated BNP is nonspecific.  The patient's significant volume overload coupled with anemia, hypoalbuminemia, and marked leukocytosis suggest some other systemic process.  I recommend diuresis and  further evaluation for underlying liver disease and nephrotic syndrome.  I agree with  cross-sectional imaging of the chest, abdomen, and pelvis for further characterization.  Consider repeating echocardiogram when respiratory status is more stable to allow for the patient to cooperate with examination.  Urine studies to exclude significant proteinuria should also be considered.  Elevated troponin: Borderline troponin elevation noted, downtrending on repeat.  Chest pain is not consistent with acute coronary syndrome.  No indication for ischemia evaluation at this time pending work-up for other underlying pathology.  As long as hemoglobin remains stable, it is reasonable to continue with low-dose aspirin.  Sinus tachycardia: Most likely compensatory from underlying medical illness; defer management to internal medicine.  No indication for administration of rate lowering cardiac agents at this time.  Lower extremity cellulitis: Antibiotics per internal medicine.  For questions or updates, please contact [place] Please consult [email or web address] for contact info under [facility] Cardiology.  Signed, Nelva Bush, MD  06/05/2020 5:49 PM

## 2020-06-05 NOTE — ED Triage Notes (Signed)
Patient c/o SOB that started several months ago. He also states his legs have been swollen. His legs are significantly swollen.

## 2020-06-05 NOTE — ED Triage Notes (Signed)
Pt here from [place] with SOB that started recently. Pt is not on oxygen at home but oxygen sats in the 80's on RA on ems arrival. Pt on 15L with sats in the 90s.

## 2020-06-05 NOTE — ED Notes (Addendum)
ED TO INPATIENT HANDOFF REPORT  ED Nurse Name and Phone #: [phone]  S Name/Age/Gender Richard Miu 59 y.o. male Room/Bed: ED19A/ED19A  Code Status   Code Status: Not on file  Home/SNF/Other Home Patient oriented to: self, place, time and situation Is this baseline? Yes   Triage Complete: Triage complete  Chief Complaint Acute respiratory failure with hypoxia ([place]) [J96.01]  Triage Note Pt here from [place] with SOB that started recently. Pt is not on oxygen at home but oxygen sats in the 80's on RA on ems arrival. Pt on 15L with sats in the 90s.     Allergies No Known Allergies  Level of Care/Admitting Diagnosis ED Disposition    ED Disposition Condition [place] [facility]: [place] [[id number]]  Level of Care: Stepdown [14]  Covid Evaluation: Confirmed COVID Negative  Diagnosis: Acute respiratory failure with hypoxia [facility]) [[id number]]  Admitting Physician: Ivor Costa [[id number]]  Attending Physician: Ivor Costa [phone]  Estimated length of stay: past midnight tomorrow  Certification:: I certify this patient will need inpatient services for at least 2 midnights       B Medical/Surgery History Past Medical History:  Diagnosis Date  . Cataract    Past Surgical History:  Procedure Laterality Date  . CATARACT EXTRACTION       A IV Location/Drains/Wounds Patient Lines/Drains/Airways Status   Active Line/Drains/Airways    Name:   Placement date:   Placement time:   Site:   Days:   Peripheral IV 06/05/20 Left Antecubital   06/05/20    0904    Antecubital   less than 1          Intake/Output Last 24 hours No intake or output data in the 24 hours ending 06/05/20 1345  Labs/Imaging Results for orders placed or performed during the hospital encounter of 06/05/20 (from the past 48 hour(s))  CBC with Differential     Status: Abnormal   Collection Time: 06/05/20  9:33 AM  Result Value Ref Range   WBC 31.9 (H) 4.0 - 10.5 K/uL   RBC  3.70 (L) 4.22 - 5.81 MIL/uL   Hemoglobin 10.9 (L) 13.0 - 17.0 g/dL   HCT 34.5 (L) 39.0 - 52.0 %   MCV 93.2 80.0 - 100.0 fL   MCH 29.5 26.0 - 34.0 pg   MCHC 31.6 30.0 - 36.0 g/dL   RDW 14.0 11.5 - 15.5 %   Platelets 452 (H) 150 - 400 K/uL   nRBC 0.2 0.0 - 0.2 %   Neutrophils Relative % 91 %   Neutro Abs 29.2 (H) 1.7 - 7.7 K/uL   Lymphocytes Relative 1 %   Lymphs Abs 0.3 (L) 0.7 - 4.0 K/uL   Monocytes Relative 6 %   Monocytes Absolute 1.7 (H) 0.1 - 1.0 K/uL   Eosinophils Relative 0 %   Eosinophils Absolute 0.0 0.0 - 0.5 K/uL   Basophils Relative 0 %   Basophils Absolute 0.1 0.0 - 0.1 K/uL   WBC Morphology MORPHOLOGY UNREMARKABLE    RBC Morphology MORPHOLOGY UNREMARKABLE    Smear Review Normal platelet morphology    Immature Granulocytes 2 %   Abs Immature Granulocytes 0.54 (H) 0.00 - 0.07 K/uL    Comment: Performed at [facility], [address] [place], [place] [address]  Basic metabolic panel     Status: Abnormal   Collection Time: 06/05/20  9:33 AM  Result Value Ref Range   Sodium 129 (L) 135 - 145 mmol/L  Potassium 4.0 3.5 - 5.1 mmol/L   Chloride 97 (L) 98 - 111 mmol/L   CO2 25 22 - 32 mmol/L   Glucose, Bld 183 (H) 70 - 99 mg/dL    Comment: Glucose reference range applies only to samples taken after fasting for at least 8 hours.   BUN 9 6 - 20 mg/dL   Creatinine, Ser 0.83 0.61 - 1.24 mg/dL   Calcium 6.8 (L) 8.9 - 10.3 mg/dL   GFR calc non Af Amer >60 >60 mL/min   GFR calc Af Amer >60 >60 mL/min   Anion gap 7 5 - 15    Comment: Performed at [facility] [address] [place], [place] [address]  Brain natriuretic peptide     Status: Abnormal   Collection Time: 06/05/20  9:33 AM  Result Value Ref Range   B Natriuretic Peptide 100.4 (H) 0.0 - 100.0 pg/mL    Comment: Performed at [facility], [place], [place] [address]  Troponin I (High Sensitivity)     Status: Abnormal   Collection Time: 06/05/20  9:33 AM  Result Value  Ref Range   Troponin I (High Sensitivity) 27 (H) <18 ng/L    Comment: (NOTE) Elevated high sensitivity troponin I (hsTnI) values and significant  changes across serial measurements may suggest ACS but many other  chronic and acute conditions are known to elevate hsTnI results.  Refer to the "Links" section for chest pain algorithms and additional  guidance. Performed at [facility], [address] [place], [place] [address]   SARS Coronavirus 2 by RT PCR (hospital order, performed in [facility] hospital lab) Nasopharyngeal Nasopharyngeal Swab     Status: None   Collection Time: 06/05/20  9:33 AM   Specimen: Nasopharyngeal Swab  Result Value Ref Range   SARS Coronavirus 2 NEGATIVE NEGATIVE    Comment: (NOTE) SARS-CoV-2 target nucleic acids are NOT DETECTED. The SARS-CoV-2 RNA is generally detectable in upper and lower respiratory specimens during the acute phase of infection. The lowest concentration of SARS-CoV-2 viral copies this assay can detect is 250 copies / mL. A negative result does not preclude SARS-CoV-2 infection and should not be used as the sole basis for treatment or other patient management decisions.  A negative result may occur with improper specimen collection / handling, submission of specimen other than nasopharyngeal swab, presence of viral mutation(s) within the areas targeted by this assay, and inadequate number of viral copies (<250 copies / mL). A negative result must be combined with clinical observations, patient history, and epidemiological information. Fact Sheet for Patients:   StrictlyIdeas.no Fact Sheet for Healthcare Providers: BankingDealers.co.za This test is not yet approved or cleared  by the [place] FDA and has been authorized for detection and/or diagnosis of SARS-CoV-2 by FDA under an Emergency Use Authorization (EUA).  This EUA will remain in effect (meaning this test can be  used) for the duration of the COVID-19 declaration under Section 564(b)(1) of the Act, 21 U.S.C. section 360bbb-3(b)(1), unless the authorization is terminated or revoked sooner. Performed at [facility], [address] [place], [place] [address]   Hepatic function panel     Status: Abnormal   Collection Time: 06/05/20  9:33 AM  Result Value Ref Range   Total Protein 5.7 (L) 6.5 - 8.1 g/dL   Albumin 1.7 (L) 3.5 - 5.0 g/dL   AST 25 15 - 41 U/L   ALT 22 0 - 44 U/L   Alkaline  Phosphatase 59 38 - 126 U/L   Total Bilirubin 1.0 0.3 - 1.2 mg/dL   Bilirubin, Direct 0.2 0.0 - 0.2 mg/dL   Indirect Bilirubin 0.8 0.3 - 0.9 mg/dL    Comment: Performed at [facility], [address] [place], [place] [address]  Magnesium     Status: None   Collection Time: 06/05/20  9:33 AM  Result Value Ref Range   Magnesium 1.7 1.7 - 2.4 mg/dL    Comment: Performed at [facility], [address] [place], [place] [address]  Phosphorus     Status: Abnormal   Collection Time: 06/05/20  9:33 AM  Result Value Ref Range   Phosphorus 2.4 (L) 2.5 - 4.6 mg/dL    Comment: Performed at [facility], [address] [place], [place] [address]  Albumin     Status: Abnormal   Collection Time: 06/05/20  9:33 AM  Result Value Ref Range   Albumin 1.7 (L) 3.5 - 5.0 g/dL    Comment: Performed at [facility], [address] [place], [place] [address]  Blood gas, venous     Status: None   Collection Time: 06/05/20  9:50 AM  Result Value Ref Range   pH, Ven 7.36 7.250 - 7.430   pCO2, Ven 46 44.0 - 60.0 mmHg   pO2, Ven 45.0 32.0 - 45.0 mmHg   Bicarbonate 26.0 20.0 - 28.0 mmol/L   Acid-Base Excess 0.2 0.0 - 2.0 mmol/L   O2 Saturation 78.7 %   Patient temperature 37.0    Collection site VEIN    Sample type VENOUS     Comment: Performed at [facility], [address] [place], [place] [address]  Lactic acid, plasma     Status: None   Collection Time:  06/05/20 10:26 AM  Result Value Ref Range   Lactic Acid, Venous 1.5 0.5 - 1.9 mmol/L    Comment: Performed at [facility], [address], [place], [place] [address]   DG Chest Port 1 View  Result Date: 06/05/2020 CLINICAL DATA:  Shortness of breath EXAM: PORTABLE CHEST 1 VIEW COMPARISON:  None. FINDINGS: There is diffuse interstitial thickening. No airspace consolidation. No appreciable pleural effusion. Heart size and pulmonary vascularity within normal limits. No adenopathy. No bone lesions. IMPRESSION: Diffuse interstitial thickening. Suspect a degree of underlying fibrosis. Noncardiogenic pulmonary edema and atypical infection are differential considerations for this appearance. No airspace consolidation noted. Cardiac silhouette within normal limits.  No adenopathy appreciable. Electronically Signed   By: Lowella Grip III M.D.   On: 06/05/2020 10:12    Pending Labs Unresulted Labs (From admission, onward)    Start     Ordered   06/05/20 1335  Sedimentation rate  Once,   STAT     06/05/20 1334   06/05/20 1335  C-reactive protein  Once,   STAT     06/05/20 1334   06/05/20 1334  Procalcitonin  ONCE - STAT,   STAT     06/05/20 1334   06/05/20 1316  Urinalysis, Complete w Microscopic  Once,   STAT     06/05/20 1315   06/05/20 1002  Blood culture (routine x 2)  BLOOD CULTURE X 2,   STAT     06/05/20 1001   Signed and Held  HIV Antibody (routine testing w rflx)  (HIV Antibody (Routine testing w reflex) panel)  Once,   R     Signed and Held   Signed and Held  TSH  Once,   R     Signed and Held   Signed and Held  Basic metabolic panel  Tomorrow morning,   R     Signed and Held   Signed and Held  CBC  Tomorrow morning,   R     Signed and Held   Signed and Held  Hemoglobin A1c  Tomorrow morning,   R     Signed and Held   Signed and Held  Lipid panel  Tomorrow morning,   R     Signed and Held          Vitals/Pain Today's Vitals   06/05/20 0934 06/05/20 0938 06/05/20  1230 06/05/20 1300  BP: 128/82  112/69 105/69  Pulse: (!) 132  (!) 107 (!) 107  Resp: (!) 23  (!) 29 (!) 32  Temp: 98.3 F (36.8 C)     TempSrc: Oral     SpO2: 98%  95% 99%  Weight:  98.4 kg    Height:  5\' 9"  (1.753 m)    PainSc:  10-Worst pain ever      Isolation Precautions No active isolations  Medications Medications  ipratropium-albuterol (DUONEB) 0.5-2.5 (3) MG/3ML nebulizer solution 3 mL (3 mLs Nebulization Not Given 06/05/20 1230)  albuterol (PROVENTIL) (2.5 MG/3ML) 0.083% nebulizer solution 2.5 mg (has no administration in time range)  dextromethorphan-guaiFENesin (MUCINEX DM) 30-600 MG per 12 hr tablet 1 tablet (has no administration in time range)  aspirin EC tablet 81 mg (has no administration in time range)  iohexol (OMNIPAQUE) 350 MG/ML injection 75 mL (has no administration in time range)  cefTRIAXone (ROCEPHIN) 1 g in sodium chloride 0.9 % 100 mL IVPB (has no administration in time range)  ipratropium-albuterol (DUONEB) 0.5-2.5 (3) MG/3ML nebulizer solution 3 mL (3 mLs Nebulization Given 06/05/20 0951)  vancomycin (VANCOCIN) IVPB 1000 mg/200 mL premix (0 mg Intravenous Stopped 06/05/20 1239)  piperacillin-tazobactam (ZOSYN) IVPB 3.375 g (0 g Intravenous Stopped 06/05/20 1058)    Mobility walks Low fall risk   Focused Assessments    R Recommendations: See Admitting Provider Note  Report given to: Gabriel Cirri, RN

## 2020-06-05 NOTE — Consult Note (Signed)
CODE SEPSIS - PHARMACY COMMUNICATION  **Broad Spectrum Antibiotics should be administered within 1 hour of Sepsis diagnosis**  Time Code Sepsis Called/Page Received: 1334  Antibiotics Ordered: Vancomycin/Zosyn  Time of 1st antibiotic administration: 1035  Additional action taken by pharmacy: none  If necessary, Name of Provider/Nurse Contacted:n/a    Lu Duffel ,PharmD Clinical Pharmacist  06/05/2020  1:38 PM

## 2020-06-05 NOTE — Progress Notes (Signed)
*  PRELIMINARY RESULTS* Echocardiogram 2D Echocardiogram has been performed.  Sherrie Sport 06/05/2020, 2:23 PM

## 2020-06-06 ENCOUNTER — Inpatient Hospital Stay: Payer: Self-pay

## 2020-06-06 DIAGNOSIS — R600 Localized edema: Secondary | ICD-10-CM

## 2020-06-06 LAB — BODY FLUID CELL COUNT WITH DIFFERENTIAL
Eos, Fluid: 0 %
Lymphs, Fluid: 3 %
Monocyte-Macrophage-Serous Fluid: 29 %
Neutrophil Count, Fluid: 68 %
Total Nucleated Cell Count, Fluid: 614 cu mm

## 2020-06-06 LAB — GLUCOSE, PLEURAL OR PERITONEAL FLUID: Glucose, Fluid: 182 mg/dL

## 2020-06-06 LAB — CBC
HCT: 29.8 % — ABNORMAL LOW (ref 39.0–52.0)
Hemoglobin: 9.4 g/dL — ABNORMAL LOW (ref 13.0–17.0)
MCH: 29.8 pg (ref 26.0–34.0)
MCHC: 31.5 g/dL (ref 30.0–36.0)
MCV: 94.6 fL (ref 80.0–100.0)
Platelets: 340 10*3/uL (ref 150–400)
RBC: 3.15 MIL/uL — ABNORMAL LOW (ref 4.22–5.81)
RDW: 14.2 % (ref 11.5–15.5)
WBC: 19.1 10*3/uL — ABNORMAL HIGH (ref 4.0–10.5)
nRBC: 0.2 % (ref 0.0–0.2)

## 2020-06-06 LAB — BASIC METABOLIC PANEL
Anion gap: 4 — ABNORMAL LOW (ref 5–15)
BUN: 10 mg/dL (ref 6–20)
CO2: 29 mmol/L (ref 22–32)
Calcium: 6.6 mg/dL — ABNORMAL LOW (ref 8.9–10.3)
Chloride: 97 mmol/L — ABNORMAL LOW (ref 98–111)
Creatinine, Ser: 0.68 mg/dL (ref 0.61–1.24)
GFR calc Af Amer: >60 mL/min (ref >60)
GFR calc non Af Amer: >60 mL/min (ref >60)
Glucose, Bld: 145 mg/dL — ABNORMAL HIGH (ref 70–99)
Potassium: 3.5 mmol/L (ref 3.5–5.1)
Sodium: 130 mmol/L — ABNORMAL LOW (ref 135–145)

## 2020-06-06 LAB — FERRITIN: Ferritin: 45 ng/mL (ref 24–336)

## 2020-06-06 LAB — HEMOGLOBIN A1C
Hgb A1c MFr Bld: 7.5 % — ABNORMAL HIGH (ref 4.8–5.6)
Mean Plasma Glucose: 168.55 mg/dL

## 2020-06-06 LAB — LIPID PANEL
Cholesterol: 83 mg/dL (ref 0–200)
HDL: 22 mg/dL — ABNORMAL LOW (ref >40)
LDL Cholesterol: 52 mg/dL (ref 0–99)
Total CHOL/HDL Ratio: 3.8 RATIO
Triglycerides: 47 mg/dL (ref <150)
VLDL: 9 mg/dL (ref 0–40)

## 2020-06-06 LAB — LACTATE DEHYDROGENASE, PLEURAL OR PERITONEAL FLUID: LD, Fluid: 31 U/L — ABNORMAL HIGH (ref 3–23)

## 2020-06-06 LAB — PROTEIN, PLEURAL OR PERITONEAL FLUID: Total protein, fluid: <3 g/dL

## 2020-06-06 LAB — BRAIN NATRIURETIC PEPTIDE: B Natriuretic Peptide: 155.9 pg/mL — ABNORMAL HIGH (ref 0.0–100.0)

## 2020-06-06 LAB — IRON AND TIBC
Iron: 9 ug/dL — ABNORMAL LOW (ref 45–182)
Saturation Ratios: 4 % — ABNORMAL LOW (ref 17.9–39.5)
TIBC: 242 ug/dL — ABNORMAL LOW (ref 250–450)
UIBC: 233 ug/dL

## 2020-06-06 LAB — PROTIME-INR
INR: 1.4 — ABNORMAL HIGH (ref 0.8–1.2)
Prothrombin Time: 16.7 seconds — ABNORMAL HIGH (ref 11.4–15.2)

## 2020-06-06 LAB — ALBUMIN, PLEURAL OR PERITONEAL FLUID: Albumin, Fluid: <1 g/dL

## 2020-06-06 MED ORDER — NAPHAZOLINE-GLYCERIN 0.012-0.2 % OP SOLN
1.0000 [drp] | Freq: Four times a day (QID) | OPHTHALMIC | Status: DC | PRN
  Administered 2020-06-07: 2 [drp] via OPHTHALMIC
  Filled 2020-06-06: qty 15

## 2020-06-06 MED ORDER — AZITHROMYCIN 500 MG PO TABS
500.0000 mg | ORAL_TABLET | Freq: Every day | ORAL | Status: AC
  Administered 2020-06-06: 500 mg via ORAL
  Filled 2020-06-06: qty 1

## 2020-06-06 MED ORDER — SODIUM CHLORIDE 0.9 % IV SOLN: 500.0000 mg | Freq: Every day | INTRAVENOUS | Status: DC

## 2020-06-06 MED ORDER — SODIUM CHLORIDE 0.9 % IV SOLN
2.0000 g | Freq: Every day | INTRAVENOUS | Status: AC
  Administered 2020-06-06 – 2020-06-12 (×7): 2 g via INTRAVENOUS
  Filled 2020-06-06 (×6): qty 2
  Filled 2020-06-06: qty 20
  Filled 2020-06-06: qty 2

## 2020-06-06 MED ORDER — AZITHROMYCIN 250 MG PO TABS
250.0000 mg | ORAL_TABLET | Freq: Every day | ORAL | Status: AC
  Administered 2020-06-07 – 2020-06-10 (×4): 250 mg via ORAL
  Filled 2020-06-06 (×4): qty 1

## 2020-06-06 MED ORDER — CHLORHEXIDINE GLUCONATE CLOTH 2 % EX PADS
6.0000 | MEDICATED_PAD | Freq: Every day | CUTANEOUS | Status: DC
  Administered 2020-06-06 – 2020-06-16 (×11): 6 via TOPICAL

## 2020-06-06 MED ORDER — POTASSIUM CHLORIDE CRYS ER 20 MEQ PO TBCR
40.0000 meq | EXTENDED_RELEASE_TABLET | Freq: Once | ORAL | Status: AC
  Administered 2020-06-06: 40 meq via ORAL
  Filled 2020-06-06: qty 2

## 2020-06-06 MED ORDER — BUDESONIDE 0.5 MG/2ML IN SUSP
0.5000 mg | Freq: Two times a day (BID) | RESPIRATORY_TRACT | Status: DC
  Administered 2020-06-06 – 2020-06-21 (×30): 0.5 mg via RESPIRATORY_TRACT
  Filled 2020-06-06 (×32): qty 2

## 2020-06-06 MED ORDER — IPRATROPIUM-ALBUTEROL 0.5-2.5 (3) MG/3ML IN SOLN
3.0000 mL | Freq: Four times a day (QID) | RESPIRATORY_TRACT | Status: DC
  Administered 2020-06-06 – 2020-06-12 (×24): 3 mL via RESPIRATORY_TRACT
  Filled 2020-06-06 (×24): qty 3

## 2020-06-06 MED ORDER — ALBUMIN HUMAN 25 % IV SOLN
12.5000 g | Freq: Once | INTRAVENOUS | Status: AC
  Administered 2020-06-06: 12.5 g via INTRAVENOUS
  Filled 2020-06-06: qty 50

## 2020-06-06 MED ORDER — SODIUM CHLORIDE 0.9 % IV SOLN
INTRAVENOUS | Status: DC | PRN
  Administered 2020-06-06 – 2020-06-07 (×3): 250 mL via INTRAVENOUS

## 2020-06-06 MED ORDER — FUROSEMIDE 10 MG/ML IJ SOLN
60.0000 mg | Freq: Once | INTRAMUSCULAR | Status: AC
  Administered 2020-06-06: 60 mg via INTRAVENOUS
  Filled 2020-06-06: qty 6

## 2020-06-06 NOTE — Progress Notes (Signed)
Progress Note  Patient Name: Collin Byrd Date of Encounter: 06/06/2020  [facility] Cardiologist: New. Dr. Saunders Revel  Subjective   Patient still has bilateral edema which has not improved from yesterday.  Had a CT scan of the chest abdomen which showed multifocal pneumonia and also cirrhosis.  He is getting antibiotics and Lasix.  Inpatient Medications    Scheduled Meds: . aspirin EC  81 mg Oral Daily  . [START ON 06/07/2020] azithromycin  250 mg Oral Daily  . budesonide (PULMICORT) nebulizer solution  0.5 mg Nebulization BID  . Chlorhexidine Gluconate Cloth  6 each Topical Daily  . enoxaparin (LOVENOX) injection  40 mg Subcutaneous Q24H  . folic acid  1 mg Oral Daily  . ipratropium-albuterol  3 mL Nebulization Q6H  . LORazepam  0-4 mg Intravenous Q6H   Followed by  . [START ON 06/07/2020] LORazepam  0-4 mg Intravenous Q12H  . multivitamin with minerals  1 tablet Oral Daily  . pneumococcal 23 valent vaccine  0.5 mL Intramuscular Tomorrow-1000  . thiamine injection  100 mg Intravenous Daily   And  . thiamine  100 mg Oral Daily   Continuous Infusions: . sodium chloride Stopped (06/06/20 1140)  . cefTRIAXone (ROCEPHIN)  IV 2 g (06/06/20 1140)   PRN Meds: sodium chloride, acetaminophen **OR** acetaminophen, albuterol, dextromethorphan-guaiFENesin, iohexol, LORazepam, LORazepam **OR** LORazepam, naphazoline-glycerin, nitroGLYCERIN, ondansetron **OR** ondansetron (ZOFRAN) IV   Vital Signs    Vitals:   06/06/20 1200 06/06/20 1326 06/06/20 1400 06/06/20 1446  BP: 115/73     Pulse: (!) 101 99 (!) 104   Resp: (!) 30 (!) 21 (!) 25   Temp: 97.8 F (36.6 C)  97.7 F (36.5 C)   TempSrc: Oral  Oral   SpO2: 90% 92% 90% 92%  Weight:      Height:        Intake/Output Summary (Last 24 hours) at 06/06/2020 1503 Last data filed at 06/06/2020 1300 Gross per 24 hour  Intake 38.7 ml  Output --  Net 38.7 ml   Last 3 Weights 06/05/2020 06/05/2020  Weight (lbs) 227 lb 11.8 oz 217 lb   Weight (kg) 103.3 kg 98.431 kg      Telemetry    Sinus tachycardia heart rate 101  ECG    No new tracing obtained  Physical Exam   GEN:  Mild respiratory distress, on high flow nasal oxygen Neck: No JVD Cardiac: RRR, no murmurs, rubs, or gallops.  Respiratory: Clear to auscultation bilaterally. GI: Soft, nontender, non-distended  MS: 2-3+ pitting edema; No deformity. Neuro:  Nonfocal  Psych: Normal affect   Labs    High Sensitivity Troponin:   Recent Labs  Lab 06/05/20 0933 06/05/20 1448 06/05/20 1616 06/05/20 1905  TROPONINIHS 27* 23* 18* 15      Chemistry Recent Labs  Lab 06/05/20 0933 06/06/20 0106  NA 129* 130*  K 4.0 3.5  CL 97* 97*  CO2 25 29  GLUCOSE 183* 145*  BUN 9 10  CREATININE 0.83 0.68  CALCIUM 6.8* 6.6*  PROT 5.7*  --   ALBUMIN 1.7*  1.7*  --   AST 25  --   ALT 22  --   ALKPHOS 59  --   BILITOT 1.0  --   GFRNONAA >60 >60  GFRAA >60 >60  ANIONGAP 7 4*     Hematology Recent Labs  Lab 06/05/20 0933 06/06/20 0106  WBC 31.9* 19.1*  RBC 3.70* 3.15*  HGB 10.9* 9.4*  HCT 34.5* 29.8*  MCV  93.2 94.6  MCH 29.5 29.8  MCHC 31.6 31.5  RDW 14.0 14.2  PLT 452* 340    BNP Recent Labs  Lab 06/05/20 0933 06/05/20 1448  BNP 100.4* 155.9*     DDimer No results for input(s): DDIMER in the last 168 hours.   Radiology    CT Angio Chest PE W and/or Wo Contrast  Result Date: 06/06/2020 CLINICAL DATA:  Shortness of breath EXAM: CT ANGIOGRAPHY CHEST CT ABDOMEN AND PELVIS WITH CONTRAST TECHNIQUE: Multidetector CT imaging of the chest was performed using the standard protocol during bolus administration of intravenous contrast. Multiplanar CT image reconstructions and MIPs were obtained to evaluate the vascular anatomy. Multidetector CT imaging of the abdomen and pelvis was performed using the standard protocol during bolus administration of intravenous contrast. CONTRAST:  139mL OMNIPAQUE IOHEXOL 350 MG/ML SOLN COMPARISON:  None. FINDINGS:  CTA CHEST FINDINGS Cardiovascular: There is no acute pulmonary embolism. The heart size is normal without evidence for significant pericardial effusion. There is no evidence for thoracic aortic dissection or aneurysm. Mediastinum/Nodes: --there are mildly enlarged mediastinal and hilar lymph nodes. There is a 2.1 x 2.8 cm mass in the right perihilar region (axial series 2, image 58). This is favored to represent an enlarged lymph node. --No axillary lymphadenopathy. --No supraclavicular lymphadenopathy. --Normal thyroid gland. --the esophagus is fluid-filled to the level of the mid thorax. Lungs/Pleura: Extensive bibasilar consolidation is noted. There are extensive bilateral ground-glass airspace opacities in the remaining portions of the lungs bilaterally. There is a moderate amount of debris within the trachea and main left and right rhonchi. There are trace bilateral pleural effusions. There is no pneumothorax. Musculoskeletal: No chest wall abnormality. No acute or significant osseous findings. Review of the MIP images confirms the above findings. CT ABDOMEN and PELVIS FINDINGS Hepatobiliary: The liver is cirrhotic. Normal gallbladder.There is no biliary ductal dilation. Pancreas: Normal contours without ductal dilatation. No peripancreatic fluid collection. Spleen: Unremarkable. Adrenals/Urinary Tract: --Adrenal glands: Unremarkable. --Right kidney/ureter: No hydronephrosis or radiopaque kidney stones. --Left kidney/ureter: No hydronephrosis or radiopaque kidney stones. --Urinary bladder: Unremarkable. Stomach/Bowel: --Stomach/Duodenum: No hiatal hernia or other gastric abnormality. Normal duodenal course and caliber. --Small bowel: Unremarkable. --Colon: Unremarkable. --Appendix: Normal. Vascular/Lymphatic: Atherosclerotic calcification is present within the non-aneurysmal abdominal aorta, without hemodynamically significant stenosis. --No retroperitoneal lymphadenopathy. --No mesenteric lymphadenopathy. --No  pelvic or inguinal lymphadenopathy. Reproductive: Unremarkable Other: There is a moderate volume of ascites. There is no free air. There is a fat and colon containing large left inguinal hernia. There is diffuse body wall edema. Musculoskeletal. No acute displaced fractures. Review of the MIP images confirms the above findings. IMPRESSION: 1. No acute pulmonary embolism. 2. Extensive bibasilar consolidation and diffuse bilateral ground-glass airspace opacities, concerning for multifocal pneumonia or aspiration. There is a moderate amount of debris within the trachea suggestive of aspiration. 3. Hilar and mediastinal adenopathy, presumably reactive in etiology. There is a 2.8 cm mass in the right hilar region favored to represent an enlarged hilar lymph node. However, a pulmonary mass is not excluded. A three-month follow-up CT of the chest is recommended to confirm resolution of this finding. 4. Trace bilateral pleural effusions. 5. Cirrhosis with moderate volume of ascites. 6. Large fat and colon containing large left inguinal hernia. 7. Anasarca. Aortic Atherosclerosis (ICD10-I70.0). Electronically Signed   By: Constance Holster M.D.   On: 06/06/2020 00:36   CT ABDOMEN PELVIS W CONTRAST  Result Date: 06/06/2020 CLINICAL DATA:  Shortness of breath EXAM: CT ANGIOGRAPHY CHEST CT ABDOMEN AND PELVIS  WITH CONTRAST TECHNIQUE: Multidetector CT imaging of the chest was performed using the standard protocol during bolus administration of intravenous contrast. Multiplanar CT image reconstructions and MIPs were obtained to evaluate the vascular anatomy. Multidetector CT imaging of the abdomen and pelvis was performed using the standard protocol during bolus administration of intravenous contrast. CONTRAST:  164mL OMNIPAQUE IOHEXOL 350 MG/ML SOLN COMPARISON:  None. FINDINGS: CTA CHEST FINDINGS Cardiovascular: There is no acute pulmonary embolism. The heart size is normal without evidence for significant pericardial  effusion. There is no evidence for thoracic aortic dissection or aneurysm. Mediastinum/Nodes: --there are mildly enlarged mediastinal and hilar lymph nodes. There is a 2.1 x 2.8 cm mass in the right perihilar region (axial series 2, image 58). This is favored to represent an enlarged lymph node. --No axillary lymphadenopathy. --No supraclavicular lymphadenopathy. --Normal thyroid gland. --the esophagus is fluid-filled to the level of the mid thorax. Lungs/Pleura: Extensive bibasilar consolidation is noted. There are extensive bilateral ground-glass airspace opacities in the remaining portions of the lungs bilaterally. There is a moderate amount of debris within the trachea and main left and right rhonchi. There are trace bilateral pleural effusions. There is no pneumothorax. Musculoskeletal: No chest wall abnormality. No acute or significant osseous findings. Review of the MIP images confirms the above findings. CT ABDOMEN and PELVIS FINDINGS Hepatobiliary: The liver is cirrhotic. Normal gallbladder.There is no biliary ductal dilation. Pancreas: Normal contours without ductal dilatation. No peripancreatic fluid collection. Spleen: Unremarkable. Adrenals/Urinary Tract: --Adrenal glands: Unremarkable. --Right kidney/ureter: No hydronephrosis or radiopaque kidney stones. --Left kidney/ureter: No hydronephrosis or radiopaque kidney stones. --Urinary bladder: Unremarkable. Stomach/Bowel: --Stomach/Duodenum: No hiatal hernia or other gastric abnormality. Normal duodenal course and caliber. --Small bowel: Unremarkable. --Colon: Unremarkable. --Appendix: Normal. Vascular/Lymphatic: Atherosclerotic calcification is present within the non-aneurysmal abdominal aorta, without hemodynamically significant stenosis. --No retroperitoneal lymphadenopathy. --No mesenteric lymphadenopathy. --No pelvic or inguinal lymphadenopathy. Reproductive: Unremarkable Other: There is a moderate volume of ascites. There is no free air. There is a  fat and colon containing large left inguinal hernia. There is diffuse body wall edema. Musculoskeletal. No acute displaced fractures. Review of the MIP images confirms the above findings. IMPRESSION: 1. No acute pulmonary embolism. 2. Extensive bibasilar consolidation and diffuse bilateral ground-glass airspace opacities, concerning for multifocal pneumonia or aspiration. There is a moderate amount of debris within the trachea suggestive of aspiration. 3. Hilar and mediastinal adenopathy, presumably reactive in etiology. There is a 2.8 cm mass in the right hilar region favored to represent an enlarged hilar lymph node. However, a pulmonary mass is not excluded. A three-month follow-up CT of the chest is recommended to confirm resolution of this finding. 4. Trace bilateral pleural effusions. 5. Cirrhosis with moderate volume of ascites. 6. Large fat and colon containing large left inguinal hernia. 7. Anasarca. Aortic Atherosclerosis (ICD10-I70.0). Electronically Signed   By: Constance Holster M.D.   On: 06/06/2020 00:36   DG Chest Port 1 View  Result Date: 06/05/2020 CLINICAL DATA:  Shortness of breath EXAM: PORTABLE CHEST 1 VIEW COMPARISON:  None. FINDINGS: There is diffuse interstitial thickening. No airspace consolidation. No appreciable pleural effusion. Heart size and pulmonary vascularity within normal limits. No adenopathy. No bone lesions. IMPRESSION: Diffuse interstitial thickening. Suspect a degree of underlying fibrosis. Noncardiogenic pulmonary edema and atypical infection are differential considerations for this appearance. No airspace consolidation noted. Cardiac silhouette within normal limits.  No adenopathy appreciable. Electronically Signed   By: Lowella Grip III M.D.   On: 06/05/2020 10:12   ECHOCARDIOGRAM COMPLETE  Result Date: 06/05/2020    ECHOCARDIOGRAM REPORT   Patient Name:   Collin Byrd Date of Exam: 06/05/2020 Medical Rec #:  [id number]        Height:       69.0 in Accession  #:    [id number]       Weight:       217.0 lb Date of Birth:  [birth year]        BSA:          2.139 m Patient Age:    85 years         BP:           106/62 mmHg Patient Gender: M                HR:           106 bpm. Exam Location:  [facility] Procedure: 2D Echo, Cardiac Doppler and Color Doppler Indications:     Elevated troponin  History:         Patient has no prior history of Echocardiogram examinations. No                  cardiac history listed in chart.  Sonographer:     Sherrie Sport RDCS (AE) Referring Phys:  Baker Janus Soledad Gerlach NIU Diagnosing Phys: Nelva Bush MD  Sonographer Comments: Technically difficult study due to poor echo windows, no parasternal window and no apical window. Pt on Bipap. IMPRESSIONS  1. Left ventricular ejection fraction, by estimation, is >55%. The left ventricle has normal function. Left ventricular endocardial border not optimally defined to evaluate regional wall motion. There is moderate left ventricular hypertrophy. Left ventricular diastolic function could not be evaluated.  2. Right ventricular systolic function is normal. The right ventricular size is normal.  3. The mitral valve is grossly normal. No evidence of mitral valve regurgitation.  4. The aortic valve was not well visualized. Aortic valve regurgitation not well assessed.  5. Pulmonic valve regurgitation not well assessed. FINDINGS  Left Ventricle: Left ventricular ejection fraction, by estimation, is >55%. The left ventricle has normal function. Left ventricular endocardial border not optimally defined to evaluate regional wall motion. The left ventricular internal cavity size was  normal in size. There is moderate left ventricular hypertrophy. Left ventricular diastolic function could not be evaluated. Right Ventricle: The right ventricular size is normal. Right vetricular wall thickness was not assessed. Right ventricular systolic function is normal. Left Atrium: Left atrial size was not well visualized. Right Atrium: Right  atrial size was not well visualized. Pericardium: A small pericardial effusion is present. Mitral Valve: The mitral valve is grossly normal. No evidence of mitral valve regurgitation. Tricuspid Valve: The tricuspid valve is not well visualized. Tricuspid valve regurgitation is trivial. Aortic Valve: The aortic valve was not well visualized. Aortic valve regurgitation not well assessed. Pulmonic Valve: The pulmonic valve was not well visualized. Pulmonic valve regurgitation not well assessed. Aorta: The aortic root was not well visualized. Pulmonary Artery: The pulmonary artery is not well seen. Venous: The inferior vena cava was not well visualized. IAS/Shunts: The interatrial septum was not well visualized.  LEFT VENTRICLE PLAX 2D LVIDd:         4.79 cm LVIDs:         2.95 cm LV PW:         1.59 cm LV IVS:        1.36 cm  LEFT ATRIUM         Index LA diam:  3.90 cm 1.82 cm/m  TRICUSPID VALVE TR Peak grad:   16.6 mmHg TR Vmax:        204.00 cm/s Nelva Bush MD Electronically signed by Nelva Bush MD Signature Date/Time: 06/05/2020/3:42:19 PM    Final     Cardiac Studies   TTE 06/05/2020 1. Left ventricular ejection fraction, by estimation, is >55%. The left  ventricle has normal function. Left ventricular endocardial border not  optimally defined to evaluate regional wall motion. There is moderate left  ventricular hypertrophy. Left  ventricular diastolic function could not be evaluated.  2. Right ventricular systolic function is normal. The right ventricular  size is normal.  3. The mitral valve is grossly normal. No evidence of mitral valve  regurgitation.  4. The aortic valve was not well visualized. Aortic valve regurgitation  not well assessed.  5. Pulmonic valve regurgitation not well assessed.  Patient Profile     59 y.o. male with no prior significant medical history presenting with shortness of breath and edema.  CT scan did reveal cirrhosis and multifocal  pneumonia.  Assessment & Plan    1.  Edema -Echo with normal EF -cirrhosis/hypoalbuminemia likely contributing -Work-up for edema/hypoalbuminemia as per primary team -No indication for invasive testing as patient is without chest pain, echo with normal EF  2.  Shortness of breath, pneumonia, cellulitis-On high flow nasal oxygen -Antibiotics and management as per primary team  3.  Cirrhosis on abdominal CT -Management as per primary team  please let us know if further cardiac input is needed.  Cardiology will sign off at this time.  Total encounter time 35 minutes  Greater than 50% was spent in counseling and coordination of care with the patient       Signed, Kate Sable, MD  06/06/2020, 3:03 PM

## 2020-06-06 NOTE — Progress Notes (Signed)
[place] [facility] [place] NAME: Collin Byrd    MR#:  [id number]  DATE OF BIRTH:  Jun 26, 1961  SUBJECTIVE:  patient came in with increasing shortness of breath leg swelling redness over his legs and abdominal distention for several weeks. He consumes about 8 to 9 beers on a daily basis. Currently on high flow nasal cannula oxygen.  No family in the room. Overall he feels better. Agreeable to get paracentesis done. REVIEW OF SYSTEMS:   Review of Systems  Constitutional: Negative for chills, fever and weight loss.  HENT: Negative for ear discharge, ear pain and nosebleeds.   Eyes: Negative for blurred vision, pain and discharge.  Respiratory: Positive for shortness of breath. Negative for sputum production, wheezing and stridor.   Cardiovascular: Positive for orthopnea and leg swelling. Negative for chest pain, palpitations and PND.  Gastrointestinal: Negative for abdominal pain, diarrhea, nausea and vomiting.  Genitourinary: Negative for frequency and urgency.  Musculoskeletal: Negative for back pain and joint pain.  Skin: Positive for rash.  Neurological: Positive for weakness. Negative for sensory change, speech change and focal weakness.  Psychiatric/Behavioral: Negative for depression and hallucinations. The patient is not nervous/anxious.    Tolerating Diet: Tolerating PT:   DRUG ALLERGIES:  No Known Allergies  VITALS:  Blood pressure (!) 102/43, pulse (!) 107, temperature 97.7 F (36.5 C), temperature source Oral, resp. rate (!) 25, height 5\' 9"  (1.753 m), weight 103.3 kg, SpO2 (!) 88 %.  PHYSICAL EXAMINATION:   Physical Exam  GENERAL:  59 y.o.-year-old patient lying in the bed with no acute distress. Severe Anasarca+ EYES: Pupils equal, round, reactive to light and accommodation. No scleral icterus.   HEENT: Head atraumatic, normocephalic. Oropharynx and nasopharynx clear.  NECK:  Supple, no jugular venous distention. No thyroid  enlargement, no tenderness.  LUNGS: Normal breath sounds bilaterally, no wheezing, rales, rhonchi. No use of accessory muscles of respiration.  CARDIOVASCULAR: S1, S2 normal. No murmurs, rubs, or gallops.  ABDOMEN: Soft, nontender, +++distended. Bowel sounds present. No organomegaly or mass. Ascites++ EXTREMITIES:  edema b/l +++ NEUROLOGIC: Cranial nerves II through XII are intact. No focal Motor or sensory deficits b/l.   PSYCHIATRIC:  patient is alert and oriented x 3.  SKIN:bilateral LE tibial shin redness +  LABORATORY PANEL:  CBC Recent Labs  Lab 06/06/20 0106  WBC 19.1*  HGB 9.4*  HCT 29.8*  PLT 340    Chemistries  Recent Labs  Lab 06/05/20 0933 06/05/20 0933 06/06/20 0106  NA 129*   < > 130*  K 4.0   < > 3.5  CL 97*   < > 97*  CO2 25   < > 29  GLUCOSE 183*   < > 145*  BUN 9   < > 10  CREATININE 0.83   < > 0.68  CALCIUM 6.8*   < > 6.6*  MG 1.7  --   --   AST 25  --   --   ALT 22  --   --   ALKPHOS 59  --   --   BILITOT 1.0  --   --    < > = values in this interval not displayed.   Cardiac Enzymes No results for input(s): TROPONINI in the last 168 hours. RADIOLOGY:  CT Angio Chest PE W and/or Wo Contrast  Result Date: 06/06/2020 CLINICAL DATA:  Shortness of breath EXAM: CT ANGIOGRAPHY CHEST CT ABDOMEN AND PELVIS WITH CONTRAST TECHNIQUE: Multidetector CT imaging of  the chest was performed using the standard protocol during bolus administration of intravenous contrast. Multiplanar CT image reconstructions and MIPs were obtained to evaluate the vascular anatomy. Multidetector CT imaging of the abdomen and pelvis was performed using the standard protocol during bolus administration of intravenous contrast. CONTRAST:  118mL OMNIPAQUE IOHEXOL 350 MG/ML SOLN COMPARISON:  None. FINDINGS: CTA CHEST FINDINGS Cardiovascular: There is no acute pulmonary embolism. The heart size is normal without evidence for significant pericardial effusion. There is no evidence for thoracic  aortic dissection or aneurysm. Mediastinum/Nodes: --there are mildly enlarged mediastinal and hilar lymph nodes. There is a 2.1 x 2.8 cm mass in the right perihilar region (axial series 2, image 58). This is favored to represent an enlarged lymph node. --No axillary lymphadenopathy. --No supraclavicular lymphadenopathy. --Normal thyroid gland. --the esophagus is fluid-filled to the level of the mid thorax. Lungs/Pleura: Extensive bibasilar consolidation is noted. There are extensive bilateral ground-glass airspace opacities in the remaining portions of the lungs bilaterally. There is a moderate amount of debris within the trachea and main left and right rhonchi. There are trace bilateral pleural effusions. There is no pneumothorax. Musculoskeletal: No chest wall abnormality. No acute or significant osseous findings. Review of the MIP images confirms the above findings. CT ABDOMEN and PELVIS FINDINGS Hepatobiliary: The liver is cirrhotic. Normal gallbladder.There is no biliary ductal dilation. Pancreas: Normal contours without ductal dilatation. No peripancreatic fluid collection. Spleen: Unremarkable. Adrenals/Urinary Tract: --Adrenal glands: Unremarkable. --Right kidney/ureter: No hydronephrosis or radiopaque kidney stones. --Left kidney/ureter: No hydronephrosis or radiopaque kidney stones. --Urinary bladder: Unremarkable. Stomach/Bowel: --Stomach/Duodenum: No hiatal hernia or other gastric abnormality. Normal duodenal course and caliber. --Small bowel: Unremarkable. --Colon: Unremarkable. --Appendix: Normal. Vascular/Lymphatic: Atherosclerotic calcification is present within the non-aneurysmal abdominal aorta, without hemodynamically significant stenosis. --No retroperitoneal lymphadenopathy. --No mesenteric lymphadenopathy. --No pelvic or inguinal lymphadenopathy. Reproductive: Unremarkable Other: There is a moderate volume of ascites. There is no free air. There is a fat and colon containing large left inguinal  hernia. There is diffuse body wall edema. Musculoskeletal. No acute displaced fractures. Review of the MIP images confirms the above findings. IMPRESSION: 1. No acute pulmonary embolism. 2. Extensive bibasilar consolidation and diffuse bilateral ground-glass airspace opacities, concerning for multifocal pneumonia or aspiration. There is a moderate amount of debris within the trachea suggestive of aspiration. 3. Hilar and mediastinal adenopathy, presumably reactive in etiology. There is a 2.8 cm mass in the right hilar region favored to represent an enlarged hilar lymph node. However, a pulmonary mass is not excluded. A three-month follow-up CT of the chest is recommended to confirm resolution of this finding. 4. Trace bilateral pleural effusions. 5. Cirrhosis with moderate volume of ascites. 6. Large fat and colon containing large left inguinal hernia. 7. Anasarca. Aortic Atherosclerosis (ICD10-I70.0). Electronically Signed   By: Constance Holster M.D.   On: 06/06/2020 00:36   CT ABDOMEN PELVIS W CONTRAST  Result Date: 06/06/2020 CLINICAL DATA:  Shortness of breath EXAM: CT ANGIOGRAPHY CHEST CT ABDOMEN AND PELVIS WITH CONTRAST TECHNIQUE: Multidetector CT imaging of the chest was performed using the standard protocol during bolus administration of intravenous contrast. Multiplanar CT image reconstructions and MIPs were obtained to evaluate the vascular anatomy. Multidetector CT imaging of the abdomen and pelvis was performed using the standard protocol during bolus administration of intravenous contrast. CONTRAST:  161mL OMNIPAQUE IOHEXOL 350 MG/ML SOLN COMPARISON:  None. FINDINGS: CTA CHEST FINDINGS Cardiovascular: There is no acute pulmonary embolism. The heart size is normal without evidence for significant pericardial effusion. There  is no evidence for thoracic aortic dissection or aneurysm. Mediastinum/Nodes: --there are mildly enlarged mediastinal and hilar lymph nodes. There is a 2.1 x 2.8 cm mass in the  right perihilar region (axial series 2, image 58). This is favored to represent an enlarged lymph node. --No axillary lymphadenopathy. --No supraclavicular lymphadenopathy. --Normal thyroid gland. --the esophagus is fluid-filled to the level of the mid thorax. Lungs/Pleura: Extensive bibasilar consolidation is noted. There are extensive bilateral ground-glass airspace opacities in the remaining portions of the lungs bilaterally. There is a moderate amount of debris within the trachea and main left and right rhonchi. There are trace bilateral pleural effusions. There is no pneumothorax. Musculoskeletal: No chest wall abnormality. No acute or significant osseous findings. Review of the MIP images confirms the above findings. CT ABDOMEN and PELVIS FINDINGS Hepatobiliary: The liver is cirrhotic. Normal gallbladder.There is no biliary ductal dilation. Pancreas: Normal contours without ductal dilatation. No peripancreatic fluid collection. Spleen: Unremarkable. Adrenals/Urinary Tract: --Adrenal glands: Unremarkable. --Right kidney/ureter: No hydronephrosis or radiopaque kidney stones. --Left kidney/ureter: No hydronephrosis or radiopaque kidney stones. --Urinary bladder: Unremarkable. Stomach/Bowel: --Stomach/Duodenum: No hiatal hernia or other gastric abnormality. Normal duodenal course and caliber. --Small bowel: Unremarkable. --Colon: Unremarkable. --Appendix: Normal. Vascular/Lymphatic: Atherosclerotic calcification is present within the non-aneurysmal abdominal aorta, without hemodynamically significant stenosis. --No retroperitoneal lymphadenopathy. --No mesenteric lymphadenopathy. --No pelvic or inguinal lymphadenopathy. Reproductive: Unremarkable Other: There is a moderate volume of ascites. There is no free air. There is a fat and colon containing large left inguinal hernia. There is diffuse body wall edema. Musculoskeletal. No acute displaced fractures. Review of the MIP images confirms the above findings.  IMPRESSION: 1. No acute pulmonary embolism. 2. Extensive bibasilar consolidation and diffuse bilateral ground-glass airspace opacities, concerning for multifocal pneumonia or aspiration. There is a moderate amount of debris within the trachea suggestive of aspiration. 3. Hilar and mediastinal adenopathy, presumably reactive in etiology. There is a 2.8 cm mass in the right hilar region favored to represent an enlarged hilar lymph node. However, a pulmonary mass is not excluded. A three-month follow-up CT of the chest is recommended to confirm resolution of this finding. 4. Trace bilateral pleural effusions. 5. Cirrhosis with moderate volume of ascites. 6. Large fat and colon containing large left inguinal hernia. 7. Anasarca. Aortic Atherosclerosis (ICD10-I70.0). Electronically Signed   By: Constance Holster M.D.   On: 06/06/2020 00:36   US Paracentesis  Result Date: 06/06/2020 INDICATION: 60 year old with ascites. EXAM: ULTRASOUND GUIDED PARACENTESIS MEDICATIONS: None. COMPLICATIONS: None immediate. PROCEDURE: Informed written consent was obtained from the patient after a discussion of the risks, benefits and alternatives to treatment. A timeout was performed prior to the initiation of the procedure. Initial ultrasound scanning demonstrates a large amount of ascites within the right lower abdominal quadrant. The right lower abdomen was prepped and draped in the usual sterile fashion. 1% lidocaine was used for local anesthesia. Following this, a 6 Fr Safe-T-Centesis catheter was introduced. An ultrasound image was saved for documentation purposes. The paracentesis was performed. The catheter was removed and a dressing was applied. The patient tolerated the procedure well without immediate post procedural complication. Patient received post-procedure intravenous albumin; see nursing notes for details. FINDINGS: A total of approximately 2.5 L of yellow fluid was removed. Samples were sent to the laboratory as  requested by the clinical team. IMPRESSION: Successful ultrasound-guided paracentesis yielding 2.5 liters of peritoneal fluid. Electronically Signed   By: Markus Daft M.D.   On: 06/06/2020 15:55   DG Chest [facility]  Result Date: 06/05/2020 CLINICAL DATA:  Shortness of breath EXAM: PORTABLE CHEST 1 VIEW COMPARISON:  None. FINDINGS: There is diffuse interstitial thickening. No airspace consolidation. No appreciable pleural effusion. Heart size and pulmonary vascularity within normal limits. No adenopathy. No bone lesions. IMPRESSION: Diffuse interstitial thickening. Suspect a degree of underlying fibrosis. Noncardiogenic pulmonary edema and atypical infection are differential considerations for this appearance. No airspace consolidation noted. Cardiac silhouette within normal limits.  No adenopathy appreciable. Electronically Signed   By: Lowella Grip III M.D.   On: 06/05/2020 10:12   ECHOCARDIOGRAM COMPLETE  Result Date: 06/05/2020    ECHOCARDIOGRAM REPORT   Patient Name:   Collin Byrd Date of Exam: 06/05/2020 Medical Rec #:  [id number]        Height:       69.0 in Accession #:    [id number]       Weight:       217.0 lb Date of Birth:  1961-04-28        BSA:          2.139 m Patient Age:    58 years         BP:           106/62 mmHg Patient Gender: M                HR:           106 bpm. Exam Location:  [facility] Procedure: 2D Echo, Cardiac Doppler and Color Doppler Indications:     Elevated troponin  History:         Patient has no prior history of Echocardiogram examinations. No                  cardiac history listed in chart.  Sonographer:     Sherrie Sport RDCS (AE) Referring Phys:  Baker Janus Soledad Gerlach NIU Diagnosing Phys: Nelva Bush MD  Sonographer Comments: Technically difficult study due to poor echo windows, no parasternal window and no apical window. Pt on Bipap. IMPRESSIONS  1. Left ventricular ejection fraction, by estimation, is >55%. The left ventricle has normal function. Left ventricular  endocardial border not optimally defined to evaluate regional wall motion. There is moderate left ventricular hypertrophy. Left ventricular diastolic function could not be evaluated.  2. Right ventricular systolic function is normal. The right ventricular size is normal.  3. The mitral valve is grossly normal. No evidence of mitral valve regurgitation.  4. The aortic valve was not well visualized. Aortic valve regurgitation not well assessed.  5. Pulmonic valve regurgitation not well assessed. FINDINGS  Left Ventricle: Left ventricular ejection fraction, by estimation, is >55%. The left ventricle has normal function. Left ventricular endocardial border not optimally defined to evaluate regional wall motion. The left ventricular internal cavity size was  normal in size. There is moderate left ventricular hypertrophy. Left ventricular diastolic function could not be evaluated. Right Ventricle: The right ventricular size is normal. Right vetricular wall thickness was not assessed. Right ventricular systolic function is normal. Left Atrium: Left atrial size was not well visualized. Right Atrium: Right atrial size was not well visualized. Pericardium: A small pericardial effusion is present. Mitral Valve: The mitral valve is grossly normal. No evidence of mitral valve regurgitation. Tricuspid Valve: The tricuspid valve is not well visualized. Tricuspid valve regurgitation is trivial. Aortic Valve: The aortic valve was not well visualized. Aortic valve regurgitation not well assessed. Pulmonic Valve: The pulmonic valve was not well visualized. Pulmonic valve regurgitation not well assessed. Aorta:  The aortic root was not well visualized. Pulmonary Artery: The pulmonary artery is not well seen. Venous: The inferior vena cava was not well visualized. IAS/Shunts: The interatrial septum was not well visualized.  LEFT VENTRICLE PLAX 2D LVIDd:         4.79 cm LVIDs:         2.95 cm LV PW:         1.59 cm LV IVS:        1.36 cm   LEFT ATRIUM         Index LA diam:    3.90 cm 1.82 cm/m  TRICUSPID VALVE TR Peak grad:   16.6 mmHg TR Vmax:        204.00 cm/s Nelva Bush MD Electronically signed by Nelva Bush MD Signature Date/Time: 06/05/2020/3:42:19 PM    Final    ASSESSMENT AND PLAN:  Collin Byrd is a 59 y.o. male without significant past medical history except for alcohol abuse (8-12 beers/day), who presents with chest pain, shortness of breath, bilateral leg edema and leg pain. Patient states that his shortness breath has been going for several months, which has worsened in the past several days.  He has cough with white mucus production, no fever or chills. Patient has oxygen desaturation to 80s in ED, requiring BiPAP.  Acute respiratory failure with hypoxia [facility]): Etiology is not clear.Pulmonary edema vs infiltrate vs combination of both  - Chest x-ray showed possible interstitial pulmonary fibrosis.   Patient has a severe leg edema, indicating possible CHF, but BNP is only 100.4.   -2D echo was done, which showed EF >55%,  left ventricular diastolic function could not be evaluated. - Pt has severe hypoalbuminemia with albumin 1.7 which at least partially contributed to leg edema. -pt will need IV lasix ?gtt with albumin given significant anasarca -Nephrology to see pt -on HFNC, Bronchodilators prn -Nasal cannula oxygen to maintain oxygen saturation above 93% when when patient is off HFNC -CT angiogram of chest--neg for PE, bilateral extensive consolidation and edema  Generalized Anasarca with severe Hypervolemic Hyponatremia: - Na 129.   - Mental status normal.   -Differential diagnosis include alcoholic liver dz, poor oral intake and dehydration, SIADH, potomania, thyroid dysfunction.   - Will check urine sodium, urine osmolality, serum osmolality. - check TSH - Fluid restriction -Dr Juleen China to see pt - avoid over correction too fast due to risk of central pontine  myelinolysis  Ascites--suspected due to ?liver cirrhosis -large-volume paracentesis perform. 2500 mL fluid removed.  -Await fluid results -G.I. consultation with Dr. Alice Reichert  Elevated troponin and chest pain: Troponin 27.  -Card, Dr. Saunders Revel is consulted -echo EF 55% - prn Nitroglycerin, Morphine, and aspirin - 2d echo is done --> as above  Sepsis due to bilateral lower leg cellulitis and bilateral pneumonia - Patient admits critical for sepsis with leukocytosis, tachycardia and tachypnea.  Lactic acid is normal.  Hemodynamically stable -cont IV Rocephin -Follow-up blood culture--negative -pro-calcitonin 4.16--3.68  Leucocytosis 31K--19.1 -as above  Alcohol abuse: -CiWA protocol  DVT ppx: SQ Lovenox Code Status: Full code Family Communication: not done, no family member is at bed side.     Disposition Plan:  Anticipate discharge back to previous environment Consults called:  nephrology, pulmonology, G.I., cardiology  Status is: Inpatient  Remains inpatient appropriate because:Inpatient level of care appropriate due to severity of illness.  Patient does not have significant past medical history, but presents with several acute severe issues, including acute respiratory failure with hypoxia, requiring BiPAP, chest  pain, elevated troponin, hyponatremia, bilateral lower leg cellulitis and sepsis.  His presentation is highly complicated.  Patient is at high risk of deterioration.  Need to be treated in hospital for at least 2 days.   Dispo: The patient is from: Home  Anticipated d/c is to: Home  Anticipated d/c date is: >2 days  Patient currently is not medically stable to d/c.       TOTAL TIME TAKING CARE OF THIS PATIENT: *40* minutes.  >50% time spent on counselling and coordination of care  Note: This dictation was prepared with Dragon dictation along with smaller phrase technology. Any transcriptional errors that result from this  process are unintentional.  Collin Byrd M.D    [facility]   CC: Primary care physician; Patient, No Pcp PerPatient ID: Collin Byrd, male   DOB: 09-24-1961, 59 y.o.   MRN: [id number]

## 2020-06-06 NOTE — Consult Note (Addendum)
GI Inpatient Consult Note  Reason for Consult: Abdominal ascites, decompensated cirrhosis    Attending Requesting Consult: Dr. Fritzi Mandes, MD  History of Present Illness: Collin Byrd is a 59 y.o. male seen for evaluation of decompensated cirrhosis with abdominal ascites at the request of Dr. Fritzi Mandes. Pt has a PMH of alcohol abuse who presented to the ED yesterday morning from urgent care with chief complaint of progressive shortness of breath and chest pain with bilateral lower extremity swelling. Upon presentation to the ED, he was found to have WBC 31.9, hypoalbuminemia at 1.7, tachycardic, tachypneic, and O2 sats in the 80s requiring BiPAP. He had CXR showing diffuse interstitial thickening. He had CT abd/pelvis with contrast which showed evidence of liver cirrhosis with moderate amount of ascites with no evidence of hepatomegaly or splenomegaly without any biliary ductal dilatation. CTA chest showed extensive bibasilar consolidation and diffuse bilateral ground-glass airspace opacities concerning for multifocal pneumonia or aspiration. Due to new diagnosis of cirrhosis, he had diagnostic paracentesis performed this afternoon with 2.5L of fluid removed, counts and culture data pending at time of note. Patient reports he drinks 8-12 beers per day and has been doing this daily since his early 12s. He does not consume wine or hard liquor. He denies any known family history of cirrhosis. He had acute hepatitis panel showing HCV Ab neg, Hep A IgM neg, Hep B surface Ag neg. He reports today was his first paracentesis. He does not follow regularly with a PCP. He has never had an endoscopy or colonoscopy. He reports he feels better since having the paracentesis done. He is still wearing high flow nasal cannula oxygen.    Past Medical History:  Past Medical History:  Diagnosis Date  . Cataract     Problem List: Patient Active Problem List   Diagnosis Date Noted  . Edema leg   . Acute respiratory  failure with hypoxia ([place]) 06/05/2020  . Hyponatremia 06/05/2020  . Elevated troponin 06/05/2020  . Leukocytosis 06/05/2020  . Bilateral lower leg cellulitis 06/05/2020  . Chest pain 06/05/2020  . Sepsis ([place]) 06/05/2020  . Alcohol abuse 06/05/2020    Past Surgical History: Past Surgical History:  Procedure Laterality Date  . CATARACT EXTRACTION      Allergies: No Known Allergies  Home Medications: No medications prior to admission.   Home medication reconciliation was completed with the patient.   Scheduled Inpatient Medications:   . aspirin EC  81 mg Oral Daily  . [START ON 06/07/2020] azithromycin  250 mg Oral Daily  . budesonide (PULMICORT) nebulizer solution  0.5 mg Nebulization BID  . Chlorhexidine Gluconate Cloth  6 each Topical Daily  . enoxaparin (LOVENOX) injection  40 mg Subcutaneous Q24H  . folic acid  1 mg Oral Daily  . ipratropium-albuterol  3 mL Nebulization Q6H  . LORazepam  0-4 mg Intravenous Q6H   Followed by  . [START ON 06/07/2020] LORazepam  0-4 mg Intravenous Q12H  . multivitamin with minerals  1 tablet Oral Daily  . pneumococcal 23 valent vaccine  0.5 mL Intramuscular Tomorrow-1000  . thiamine injection  100 mg Intravenous Daily   And  . thiamine  100 mg Oral Daily    Continuous Inpatient Infusions:   . sodium chloride Stopped (06/06/20 1140)  . cefTRIAXone (ROCEPHIN)  IV 2 g (06/06/20 1140)    PRN Inpatient Medications:  sodium chloride, acetaminophen **OR** acetaminophen, albuterol, dextromethorphan-guaiFENesin, iohexol, LORazepam, LORazepam **OR** LORazepam, naphazoline-glycerin, nitroGLYCERIN, ondansetron **OR** ondansetron (ZOFRAN) IV  Family History: family  history is not on file.  The patient's family history is negative for inflammatory bowel disorders, GI malignancy, or solid organ transplantation.  Social History:   reports that he has been smoking cigarettes. He has been smoking about 0.25 packs per day. He has never used smokeless  tobacco. He reports current alcohol use of about 21.0 standard drinks of alcohol per week. He reports that he does not use drugs. The patient denies ETOH, tobacco, or drug use.   Review of Systems: Constitutional: Weight is stable.  Eyes: No changes in vision. ENT: No oral lesions, sore throat.  GI: see HPI.  Heme/Lymph: No easy bruising.  CV: No chest pain.  GU: No hematuria.  Integumentary: No rashes.  Neuro: No headaches.  Psych: No depression/anxiety.  Endocrine: No heat/cold intolerance.  Allergic/Immunologic: No urticaria.  Resp: No cough, SOB.  Musculoskeletal: No joint swelling.    Physical Examination: BP (!) 102/43 (BP Location: Right Arm)   Pulse (!) 107   Temp 97.7 F (36.5 C) (Oral)   Resp (!) 25   Ht 5\' 9"  (1.753 m)   Wt 103.3 kg   SpO2 (!) 88%   BMI 33.63 kg/m   No accessory muscle use, pleasant male resting in hospital bed Gen: NAD, alert and oriented x 4 HEENT: PEERLA, EOMI, Neck: supple, no JVD or thyromegaly Chest: CTA bilaterally, no wheezes, crackles, or other adventitious sounds CV: RRR, no m/g/c/r Abd: soft, moderately distended, hypoactive BS in all four quadrants; no HSM, guarding, ridigity, or rebound tenderness, + ascites present Ext: no edema, well perfused with 2+ pulses, Skin: no rash or lesions noted Lymph: no LAD  Data: Lab Results  Component Value Date   WBC 19.1 (H) 06/06/2020   HGB 9.4 (L) 06/06/2020   HCT 29.8 (L) 06/06/2020   MCV 94.6 06/06/2020   PLT 340 06/06/2020   Recent Labs  Lab 06/05/20 0933 06/06/20 0106  HGB 10.9* 9.4*   Lab Results  Component Value Date   NA 130 (L) 06/06/2020   K 3.5 06/06/2020   CL 97 (L) 06/06/2020   CO2 29 06/06/2020   BUN 10 06/06/2020   CREATININE 0.68 06/06/2020   Lab Results  Component Value Date   ALT 22 06/05/2020   AST 25 06/05/2020   ALKPHOS 59 06/05/2020   BILITOT 1.0 06/05/2020   No results for input(s): APTT, INR, PTT in the last 168 hours.   CTA chest/abd/pelvis  06/06/20: IMPRESSION: 1. No acute pulmonary embolism. 2. Extensive bibasilar consolidation and diffuse bilateral ground-glass airspace opacities, concerning for multifocal pneumonia or aspiration. There is a moderate amount of debris within the trachea suggestive of aspiration. 3. Hilar and mediastinal adenopathy, presumably reactive in etiology. There is a 2.8 cm mass in the right hilar region favored to represent an enlarged hilar lymph node. However, a pulmonary mass is not excluded. A three-month follow-up CT of the chest is recommended to confirm resolution of this finding. 4. Trace bilateral pleural effusions. 5. Cirrhosis with moderate volume of ascites. 6. Large fat and colon containing large left inguinal hernia. 7. Anasarca.  Assessment/Plan:  59 y/o Hispanic male with a PMH of alcohol abuse (8-12 beers/daily) admitted for progressive shortness of breath, chest pain, abdominal distention, and bilateral lower extremity edema  1. Acute respiratory failure with hypoxia - etiology unclear, pulmonary edema vs multifocal pneumonia or aspiration or combination  2. Sepsis - bilateral lower leg cellulitis and bilateral pneumonia and new diagnosis of SBP  3. Decompensated cirrhosis with abdominal ascites -  new diagnosis found during this admission, likely 2/2 alcohol  4. Spontaneous Bacterial Peritonitis - PMN 412  -Diagnostic paracentesis performed today removing 2.5L ascitic fluid, cell count 614 with 68% neutrophils yields a PMN count of 417 suggestive +SBP, other studies pending at time of note -Due to national shortage on Cefotaxime, Ceftriaxone 2g IV q24 hours x 5 days will be adequate coverage for SBP -No evidence of elevated creatinine or BUN -Follow up on other fluid studies/culture data from paracentesis -Discussed pathophysiology of cirrhosis with patient, diagnosis, clinical manifestations, and treatment goals. He appreciated the discussion -Unable to calculate MELD as  no INR has been drawn. We will add INR, AFP -Suspect MELD is not very high as no evidence of renal dysfunction and no elevated LFTs -Following along with you    Thank you for the consult. Please call with questions or concerns.  Reeves Forth [facility] [phone] (Cell)

## 2020-06-06 NOTE — Procedures (Signed)
Interventional Radiology Procedure:   Indications: Ascites  Procedure: US guided paracentesis  Findings: Removed 2500 ml from right lower quadrant  Complications: None     EBL: Less than 10 ml   Collin Byrd R. Anselm Pancoast, MD  Pager: [phone]

## 2020-06-06 NOTE — Progress Notes (Signed)
Pt has remained on HFNC throughout shift, Pt Alert and oriented x4. Had a long conversation with patient about getting routine care outside of the hospital on a regular basis. Pt understood and asked appropriate questions. Started on Lasix. Good urine output. Up to [facility] about 15 times. Steady on feet. Pt had a paracentesis completed and they removed 2.5 L from RLQ. Pt has been sitting in chair all day. Got pt in bed for paracentesis, currently still in bed resting comfortably watching tv.

## 2020-06-07 ENCOUNTER — Inpatient Hospital Stay: Payer: Self-pay

## 2020-06-07 DIAGNOSIS — Z515 Encounter for palliative care: Secondary | ICD-10-CM

## 2020-06-07 DIAGNOSIS — K7031 Alcoholic cirrhosis of liver with ascites: Secondary | ICD-10-CM

## 2020-06-07 DIAGNOSIS — Z7189 Other specified counseling: Secondary | ICD-10-CM

## 2020-06-07 DIAGNOSIS — K652 Spontaneous bacterial peritonitis: Secondary | ICD-10-CM

## 2020-06-07 DIAGNOSIS — K7460 Unspecified cirrhosis of liver: Secondary | ICD-10-CM

## 2020-06-07 HISTORY — DX: Spontaneous bacterial peritonitis: K65.2

## 2020-06-07 LAB — CBC WITH DIFFERENTIAL/PLATELET
Abs Immature Granulocytes: 0.18 10*3/uL — ABNORMAL HIGH (ref 0.00–0.07)
Basophils Absolute: 0.1 10*3/uL (ref 0.0–0.1)
Basophils Relative: 0 %
Eosinophils Absolute: 0.1 10*3/uL (ref 0.0–0.5)
Eosinophils Relative: 1 %
HCT: 31.9 % — ABNORMAL LOW (ref 39.0–52.0)
Hemoglobin: 10 g/dL — ABNORMAL LOW (ref 13.0–17.0)
Immature Granulocytes: 1 %
Lymphocytes Relative: 6 %
Lymphs Abs: 0.8 10*3/uL (ref 0.7–4.0)
MCH: 29.6 pg (ref 26.0–34.0)
MCHC: 31.3 g/dL (ref 30.0–36.0)
MCV: 94.4 fL (ref 80.0–100.0)
Monocytes Absolute: 1.3 10*3/uL — ABNORMAL HIGH (ref 0.1–1.0)
Monocytes Relative: 9 %
Neutro Abs: 12.1 10*3/uL — ABNORMAL HIGH (ref 1.7–7.7)
Neutrophils Relative %: 83 %
Platelets: 366 10*3/uL (ref 150–400)
RBC: 3.38 MIL/uL — ABNORMAL LOW (ref 4.22–5.81)
RDW: 14.4 % (ref 11.5–15.5)
WBC: 14.6 10*3/uL — ABNORMAL HIGH (ref 4.0–10.5)
nRBC: 0.1 % (ref 0.0–0.2)

## 2020-06-07 LAB — COMPREHENSIVE METABOLIC PANEL
ALT: 20 U/L (ref 0–44)
AST: 22 U/L (ref 15–41)
Albumin: 1.7 g/dL — ABNORMAL LOW (ref 3.5–5.0)
Alkaline Phosphatase: 50 U/L (ref 38–126)
Anion gap: 6 (ref 5–15)
BUN: 8 mg/dL (ref 6–20)
CO2: 30 mmol/L (ref 22–32)
Calcium: 7 mg/dL — ABNORMAL LOW (ref 8.9–10.3)
Chloride: 97 mmol/L — ABNORMAL LOW (ref 98–111)
Creatinine, Ser: 0.54 mg/dL — ABNORMAL LOW (ref 0.61–1.24)
GFR calc Af Amer: >60 mL/min (ref >60)
GFR calc non Af Amer: >60 mL/min (ref >60)
Glucose, Bld: 142 mg/dL — ABNORMAL HIGH (ref 70–99)
Potassium: 3.9 mmol/L (ref 3.5–5.1)
Sodium: 133 mmol/L — ABNORMAL LOW (ref 135–145)
Total Bilirubin: 0.9 mg/dL (ref 0.3–1.2)
Total Protein: 5.2 g/dL — ABNORMAL LOW (ref 6.5–8.1)

## 2020-06-07 LAB — GLUCOSE, CAPILLARY
Glucose-Capillary: 228 mg/dL — ABNORMAL HIGH (ref 70–99)
Glucose-Capillary: 321 mg/dL — ABNORMAL HIGH (ref 70–99)
Glucose-Capillary: 257 mg/dL — ABNORMAL HIGH (ref 70–99)
Glucose-Capillary: 205 mg/dL — ABNORMAL HIGH (ref 70–99)

## 2020-06-07 LAB — PH, BODY FLUID: pH, Body Fluid: 7.4

## 2020-06-07 MED ORDER — ALBUMIN HUMAN 25 % IV SOLN
25.0000 g | Freq: Three times a day (TID) | INTRAVENOUS | Status: AC
  Administered 2020-06-07 – 2020-06-09 (×9): 25 g via INTRAVENOUS
  Filled 2020-06-07 (×9): qty 100

## 2020-06-07 MED ORDER — FERROUS SULFATE 325 (65 FE) MG PO TABS
325.0000 mg | ORAL_TABLET | Freq: Two times a day (BID) | ORAL | Status: DC
  Administered 2020-06-07 – 2020-06-21 (×27): 325 mg via ORAL
  Filled 2020-06-07 (×26): qty 1

## 2020-06-07 MED ORDER — METHYLPREDNISOLONE SODIUM SUCC 40 MG IJ SOLR
40.0000 mg | Freq: Two times a day (BID) | INTRAMUSCULAR | Status: DC
  Administered 2020-06-07 – 2020-06-11 (×9): 40 mg via INTRAVENOUS
  Filled 2020-06-07 (×9): qty 1

## 2020-06-07 MED ORDER — SODIUM CHLORIDE 0.9% FLUSH: 10.0000 mL | INTRAVENOUS | Status: DC | PRN

## 2020-06-07 MED ORDER — SPIRONOLACTONE 25 MG PO TABS
100.0000 mg | ORAL_TABLET | Freq: Every day | ORAL | Status: DC
  Administered 2020-06-07 – 2020-06-13 (×7): 100 mg via ORAL
  Filled 2020-06-07 (×7): qty 4

## 2020-06-07 MED ORDER — INSULIN ASPART 100 UNIT/ML ~~LOC~~ SOLN
0.0000 [IU] | Freq: Three times a day (TID) | SUBCUTANEOUS | Status: DC
  Administered 2020-06-07: 5 [IU] via SUBCUTANEOUS
  Administered 2020-06-07: 15 [IU] via SUBCUTANEOUS
  Administered 2020-06-07: 5 [IU] via SUBCUTANEOUS
  Administered 2020-06-08: 3 [IU] via SUBCUTANEOUS
  Administered 2020-06-08: 8 [IU] via SUBCUTANEOUS
  Administered 2020-06-08: 3 [IU] via SUBCUTANEOUS
  Administered 2020-06-09: 11 [IU] via SUBCUTANEOUS
  Administered 2020-06-09: 5 [IU] via SUBCUTANEOUS
  Filled 2020-06-07 (×7): qty 1

## 2020-06-07 MED ORDER — PANTOPRAZOLE SODIUM 40 MG IV SOLR
40.0000 mg | Freq: Two times a day (BID) | INTRAVENOUS | Status: DC
  Administered 2020-06-07 – 2020-06-20 (×26): 40 mg via INTRAVENOUS
  Filled 2020-06-07 (×28): qty 40

## 2020-06-07 MED ORDER — INSULIN ASPART 100 UNIT/ML ~~LOC~~ SOLN
0.0000 [IU] | Freq: Every day | SUBCUTANEOUS | Status: DC
  Administered 2020-06-07: 2 [IU] via SUBCUTANEOUS
  Administered 2020-06-10: 3 [IU] via SUBCUTANEOUS
  Administered 2020-06-11: 2 [IU] via SUBCUTANEOUS
  Administered 2020-06-14: 4 [IU] via SUBCUTANEOUS
  Administered 2020-06-15: 5 [IU] via SUBCUTANEOUS
  Administered 2020-06-16: 4 [IU] via SUBCUTANEOUS
  Administered 2020-06-18: 22:00:00 2 [IU] via SUBCUTANEOUS
  Administered 2020-06-19: 5 [IU] via SUBCUTANEOUS
  Administered 2020-06-20: 22:00:00 2 [IU] via SUBCUTANEOUS
  Filled 2020-06-07 (×9): qty 1

## 2020-06-07 MED ORDER — POTASSIUM CHLORIDE CRYS ER 20 MEQ PO TBCR
40.0000 meq | EXTENDED_RELEASE_TABLET | Freq: Once | ORAL | Status: AC
  Administered 2020-06-07: 40 meq via ORAL
  Filled 2020-06-07: qty 2

## 2020-06-07 MED ORDER — SODIUM CHLORIDE 0.9% FLUSH
10.0000 mL | Freq: Two times a day (BID) | INTRAVENOUS | Status: DC
  Administered 2020-06-07 – 2020-06-15 (×16): 10 mL
  Administered 2020-06-16: 20 mL
  Administered 2020-06-16 – 2020-06-17 (×2): 10 mL
  Administered 2020-06-17: 20 mL
  Administered 2020-06-18 – 2020-06-20 (×5): 10 mL

## 2020-06-07 MED ORDER — FUROSEMIDE 10 MG/ML IJ SOLN
4.0000 mg/h | INTRAVENOUS | Status: DC
  Administered 2020-06-07 – 2020-06-10 (×3): 8 mg/h via INTRAVENOUS
  Filled 2020-06-07 (×4): qty 25

## 2020-06-07 MED ORDER — OCTREOTIDE LOAD VIA INFUSION
100.0000 ug | Freq: Once | INTRAVENOUS | Status: AC
  Administered 2020-06-07: 100 ug via INTRAVENOUS
  Filled 2020-06-07: qty 50

## 2020-06-07 MED ORDER — SODIUM CHLORIDE 0.9 % IV SOLN
50.0000 ug/h | INTRAVENOUS | Status: DC
  Administered 2020-06-07 – 2020-06-12 (×10): 50 ug/h via INTRAVENOUS
  Filled 2020-06-07 (×20): qty 1

## 2020-06-07 NOTE — Consult Note (Signed)
[facility]  CONSULT NOTE    Date: 06/07/2020                  Patient Name:  Collin Byrd  MRN: [id number]  DOB: January 02, 1961  Age / Sex: 59 y.o., male         PCP: Patient, No Pcp Per                 Service Requesting Consult: Dr. Posey Pronto                 Reason for Consult: Anasarca            History of Present Illness: Mr. Lenny Fiumara admitted for pneumonia and found to have anasarca and hepatic cirrhosis. Patient is volume overloaded with minimal improvement with IV furosemide and IV albumin.   Patient is not able to give much of a history. He states he is short of breath. He drinks alcohol daily.    Medications: Outpatient medications: No medications prior to admission.    Current medications: Current Facility-Administered Medications  Medication Dose Route Frequency Provider Last Rate Last Admin  . 0.9 %  sodium chloride infusion   Intravenous PRN Fritzi Mandes, MD   Stopped at 06/06/20 1140  . acetaminophen (TYLENOL) tablet 650 mg  650 mg Oral Q6H PRN Ivor Costa, MD       Or  . acetaminophen (TYLENOL) suppository 650 mg  650 mg Rectal Q6H PRN Ivor Costa, MD      . albuterol (PROVENTIL) (2.5 MG/3ML) 0.083% nebulizer solution 2.5 mg  2.5 mg Nebulization Q4H PRN Ivor Costa, MD      . aspirin EC tablet 81 mg  81 mg Oral Daily Ivor Costa, MD   81 mg at 06/06/20 0901  . azithromycin (ZITHROMAX) tablet 250 mg  250 mg Oral Daily Flora Lipps, MD      . budesonide (PULMICORT) nebulizer solution 0.5 mg  0.5 mg Nebulization BID Flora Lipps, MD   0.5 mg at 06/07/20 0743  . cefTRIAXone (ROCEPHIN) 2 g in sodium chloride 0.9 % 100 mL IVPB  2 g Intravenous Daily Flora Lipps, MD   Stopped at 06/06/20 1220  . Chlorhexidine Gluconate Cloth 2 % PADS 6 each  6 each Topical Daily Ivor Costa, MD   6 each at 06/06/20 [phone]  . dextromethorphan-guaiFENesin (MUCINEX DM) 30-600 MG per 12 hr tablet 1 tablet  1 tablet Oral BID PRN Ivor Costa, MD      . enoxaparin  (LOVENOX) injection 40 mg  40 mg Subcutaneous Q24H Ivor Costa, MD   40 mg at 06/06/20 2200  . folic acid (FOLVITE) tablet 1 mg  1 mg Oral Daily Flora Lipps, MD   1 mg at 06/06/20 0901  . iohexol (OMNIPAQUE) 350 MG/ML injection 75 mL  75 mL Intravenous Once PRN Ivor Costa, MD      . ipratropium-albuterol (DUONEB) 0.5-2.5 (3) MG/3ML nebulizer solution 3 mL  3 mL Nebulization Q6H Fritzi Mandes, MD   3 mL at 06/07/20 0743  . LORazepam (ATIVAN) injection 0-4 mg  0-4 mg Intravenous Q6H Ivor Costa, MD       Followed by  . LORazepam (ATIVAN) injection 0-4 mg  0-4 mg Intravenous Q12H Ivor Costa, MD      . LORazepam (ATIVAN) injection 1-2 mg  1-2 mg Intravenous Q1H PRN Flora Lipps, MD      . LORazepam (ATIVAN) tablet 1-4 mg  1-4 mg Oral Q1H PRN Ivor Costa, MD  Or  . LORazepam (ATIVAN) injection 1-4 mg  1-4 mg Intravenous Q1H PRN Ivor Costa, MD      . methylPREDNISolone sodium succinate (SOLU-MEDROL) 40 mg/mL injection 40 mg  40 mg Intravenous Q12H Flora Lipps, MD   40 mg at 06/07/20 0738  . multivitamin with minerals tablet 1 tablet  1 tablet Oral Daily Flora Lipps, MD   1 tablet at 06/06/20 0901  . naphazoline-glycerin (CLEAR EYES REDNESS) ophth solution 1-2 drop  1-2 drop Both Eyes QID PRN Flora Lipps, MD      . nitroGLYCERIN (NITROSTAT) SL tablet 0.4 mg  0.4 mg Sublingual Q5 min PRN Ivor Costa, MD      . ondansetron [facility]) tablet 4 mg  4 mg Oral Q6H PRN Ivor Costa, MD       Or  . ondansetron (ZOFRAN) injection 4 mg  4 mg Intravenous Q6H PRN Ivor Costa, MD      . pneumococcal 23 valent vaccine (PNEUMOVAX-23) injection 0.5 mL  0.5 mL Intramuscular Tomorrow-1000 Ivor Costa, MD      . thiamine (B-1) injection 100 mg  100 mg Intravenous Daily Ivor Costa, MD   100 mg at 06/05/20 2102   And  . thiamine tablet 100 mg  100 mg Oral Daily Ivor Costa, MD   100 mg at 06/06/20 0932      Allergies: No Known Allergies    Past Medical History: Past Medical History:  Diagnosis Date  . Cataract       Past Surgical History: Past Surgical History:  Procedure Laterality Date  . CATARACT EXTRACTION       Family History: Family History  Problem Relation Age of Onset  . Heart disease Neg Hx      Social History: Social History   Socioeconomic History  . Marital status: Single    Spouse name: Not on file  . Number of children: Not on file  . Years of education: Not on file  . Highest education level: Not on file  Occupational History  . Not on file  Tobacco Use  . Smoking status: Current Every Day Smoker    Packs/day: 0.25    Types: Cigarettes  . Smokeless tobacco: Never Used  Vaping Use  . Vaping Use: Never used  Substance and Sexual Activity  . Alcohol use: Yes    Alcohol/week: 21.0 standard drinks    Types: 21 Cans of beer per week  . Drug use: Never  . Sexual activity: Not on file  Other Topics Concern  . Not on file  Social History Narrative  . Not on file   Social Determinants of Health   Financial Resource Strain:   . Difficulty of Paying Living Expenses:   Food Insecurity:   . Worried About Charity fundraiser in the Last Year:   . Arboriculturist in the Last Year:   Transportation Needs:   . Film/video editor (Medical):   Marland Kitchen Lack of Transportation (Non-Medical):   Physical Activity:   . Days of Exercise per Week:   . Minutes of Exercise per Session:   Stress:   . Feeling of Stress :   Social Connections:   . Frequency of Communication with Friends and Family:   . Frequency of Social Gatherings with Friends and Family:   . Attends Religious Services:   . Active Member of Clubs or Organizations:   . Attends Archivist Meetings:   Marland Kitchen Marital Status:   Intimate Partner Violence:   .  Fear of Current or Ex-Partner:   . Emotionally Abused:   Marland Kitchen Physically Abused:   . Sexually Abused:      Review of Systems: Review of Systems  Unable to perform ROS: Mental status change    Vital Signs: Blood pressure 107/64, pulse 93,  temperature 97.7 F (36.5 C), temperature source Oral, resp. rate (!) 27, height 5\' 9"  (1.753 m), weight 101.3 kg, SpO2 (!) 89 %.  Weight trends: Filed Weights   06/05/20 0938 06/05/20 1400 06/07/20 0301  Weight: 98.4 kg 103.3 kg 101.3 kg    Physical Exam: General: NAD, sitting in chair  Head: Normocephalic, atraumatic. Moist oral mucosal membranes  Eyes: Anicteric  Neck: Supple, trachea midline  Lungs:  Clear, HFNC  Heart: Regular rate and rhythm  Abdomen:  Soft, nontender,   Extremities: +++ peripheral edema.  Neurologic: Nonfocal, moving all four extremities  Skin: +jaundice         Lab results: Basic Metabolic Panel: Recent Labs  Lab 06/05/20 0933 06/06/20 0106  NA 129* 130*  K 4.0 3.5  CL 97* 97*  CO2 25 29  GLUCOSE 183* 145*  BUN 9 10  CREATININE 0.83 0.68  CALCIUM 6.8* 6.6*  MG 1.7  --   PHOS 2.4*  --     Liver Function Tests: Recent Labs  Lab 06/05/20 0933  AST 25  ALT 22  ALKPHOS 59  BILITOT 1.0  PROT 5.7*  ALBUMIN 1.7*  1.7*   No results for input(s): LIPASE, AMYLASE in the last 168 hours. No results for input(s): AMMONIA in the last 168 hours.  CBC: Recent Labs  Lab 06/05/20 0933 06/06/20 0106 06/07/20 0814  WBC 31.9* 19.1* 14.6*  NEUTROABS 29.2*  --  12.1*  HGB 10.9* 9.4* 10.0*  HCT 34.5* 29.8* 31.9*  MCV 93.2 94.6 94.4  PLT 452* 340 366    Cardiac Enzymes: No results for input(s): CKTOTAL, CKMB, CKMBINDEX, TROPONINI in the last 168 hours.  BNP: Invalid input(s): POCBNP  CBG: Recent Labs  Lab 06/05/20 1446  GLUCAP 155*    Microbiology: Results for orders placed or performed during the hospital encounter of 06/05/20  SARS Coronavirus 2 by RT PCR (hospital order, performed in [facility] hospital lab) Nasopharyngeal Nasopharyngeal Swab     Status: None   Collection Time: 06/05/20  9:33 AM   Specimen: Nasopharyngeal Swab  Result Value Ref Range Status   SARS Coronavirus 2 NEGATIVE NEGATIVE Final    Comment:  (NOTE) SARS-CoV-2 target nucleic acids are NOT DETECTED. The SARS-CoV-2 RNA is generally detectable in upper and lower respiratory specimens during the acute phase of infection. The lowest concentration of SARS-CoV-2 viral copies this assay can detect is 250 copies / mL. A negative result does not preclude SARS-CoV-2 infection and should not be used as the sole basis for treatment or other patient management decisions.  A negative result may occur with improper specimen collection / handling, submission of specimen other than nasopharyngeal swab, presence of viral mutation(s) within the areas targeted by this assay, and inadequate number of viral copies (<250 copies / mL). A negative result must be combined with clinical observations, patient history, and epidemiological information. Fact Sheet for Patients:   StrictlyIdeas.no Fact Sheet for Healthcare Providers: BankingDealers.co.za This test is not yet approved or cleared  by the [place] FDA and has been authorized for detection and/or diagnosis of SARS-CoV-2 by FDA under an Emergency Use Authorization (EUA).  This EUA will remain in effect (meaning this test can be  used) for the duration of the COVID-19 declaration under Section 564(b)(1) of the Act, 21 U.S.C. section 360bbb-3(b)(1), unless the authorization is terminated or revoked sooner. Performed at [facility], [address] [place], [place] [address]   Blood culture (routine x 2)     Status: None (Preliminary result)   Collection Time: 06/05/20 10:26 AM   Specimen: BLOOD  Result Value Ref Range Status   Specimen Description   Final    BLOOD Blood Culture results may not be optimal due to an excessive volume of blood received in culture bottles   Special Requests   Final    BOTTLES DRAWN AEROBIC AND ANAEROBIC LEFT ANTECUBITAL   Culture   Final    NO GROWTH 2 DAYS Performed at [facility], [address] [place], [place] [address]    Report Status PENDING  Incomplete  Blood culture (routine x 2)     Status: None (Preliminary result)   Collection Time: 06/05/20 10:26 AM   Specimen: BLOOD  Result Value Ref Range Status   Specimen Description   Final    BLOOD Blood Culture results may not be optimal due to an excessive volume of blood received in culture bottles   Special Requests   Final    BOTTLES DRAWN AEROBIC AND ANAEROBIC RIGHT ANTECUBITAL   Culture   Final    NO GROWTH 2 DAYS Performed at [facility], [address] [place], [place] [address]    Report Status PENDING  Incomplete  MRSA PCR Screening     Status: None   Collection Time: 06/05/20  2:42 PM   Specimen: Nasopharyngeal  Result Value Ref Range Status   MRSA by PCR NEGATIVE NEGATIVE Final    Comment:        The GeneXpert MRSA Assay (FDA approved for NASAL specimens only), is one component of a comprehensive MRSA colonization surveillance program. It is not intended to diagnose MRSA infection nor to guide or monitor treatment for MRSA infections. Performed at [facility], [address] [place], [place] [address]     Coagulation Studies: Recent Labs    06/06/20 1821  LABPROT 16.7*  INR 1.4*    Urinalysis: No results for input(s): COLORURINE, LABSPEC, PHURINE, GLUCOSEU, HGBUR, BILIRUBINUR, KETONESUR, PROTEINUR, UROBILINOGEN, NITRITE, LEUKOCYTESUR in the last 72 hours.  Invalid input(s): APPERANCEUR    Imaging: CT Angio Chest PE W and/or Wo Contrast  Result Date: 06/06/2020 CLINICAL DATA:  Shortness of breath EXAM: CT ANGIOGRAPHY CHEST CT ABDOMEN AND PELVIS WITH CONTRAST TECHNIQUE: Multidetector CT imaging of the chest was performed using the standard protocol during bolus administration of intravenous contrast. Multiplanar CT image reconstructions and MIPs were obtained to evaluate the vascular anatomy. Multidetector CT imaging of the abdomen and pelvis was performed using  the standard protocol during bolus administration of intravenous contrast. CONTRAST:  150mL OMNIPAQUE IOHEXOL 350 MG/ML SOLN COMPARISON:  None. FINDINGS: CTA CHEST FINDINGS Cardiovascular: There is no acute pulmonary embolism. The heart size is normal without evidence for significant pericardial effusion. There is no evidence for thoracic aortic dissection or aneurysm. Mediastinum/Nodes: --there are mildly enlarged mediastinal and hilar lymph nodes. There is a 2.1 x 2.8 cm mass in the right perihilar region (axial series 2, image 58). This is favored to represent an enlarged lymph node. --No axillary lymphadenopathy. --No supraclavicular lymphadenopathy. --Normal thyroid gland. --the esophagus is fluid-filled to the level of the mid thorax. Lungs/Pleura: Extensive bibasilar consolidation is noted. There are extensive bilateral ground-glass airspace  opacities in the remaining portions of the lungs bilaterally. There is a moderate amount of debris within the trachea and main left and right rhonchi. There are trace bilateral pleural effusions. There is no pneumothorax. Musculoskeletal: No chest wall abnormality. No acute or significant osseous findings. Review of the MIP images confirms the above findings. CT ABDOMEN and PELVIS FINDINGS Hepatobiliary: The liver is cirrhotic. Normal gallbladder.There is no biliary ductal dilation. Pancreas: Normal contours without ductal dilatation. No peripancreatic fluid collection. Spleen: Unremarkable. Adrenals/Urinary Tract: --Adrenal glands: Unremarkable. --Right kidney/ureter: No hydronephrosis or radiopaque kidney stones. --Left kidney/ureter: No hydronephrosis or radiopaque kidney stones. --Urinary bladder: Unremarkable. Stomach/Bowel: --Stomach/Duodenum: No hiatal hernia or other gastric abnormality. Normal duodenal course and caliber. --Small bowel: Unremarkable. --Colon: Unremarkable. --Appendix: Normal. Vascular/Lymphatic: Atherosclerotic calcification is present within the  non-aneurysmal abdominal aorta, without hemodynamically significant stenosis. --No retroperitoneal lymphadenopathy. --No mesenteric lymphadenopathy. --No pelvic or inguinal lymphadenopathy. Reproductive: Unremarkable Other: There is a moderate volume of ascites. There is no free air. There is a fat and colon containing large left inguinal hernia. There is diffuse body wall edema. Musculoskeletal. No acute displaced fractures. Review of the MIP images confirms the above findings. IMPRESSION: 1. No acute pulmonary embolism. 2. Extensive bibasilar consolidation and diffuse bilateral ground-glass airspace opacities, concerning for multifocal pneumonia or aspiration. There is a moderate amount of debris within the trachea suggestive of aspiration. 3. Hilar and mediastinal adenopathy, presumably reactive in etiology. There is a 2.8 cm mass in the right hilar region favored to represent an enlarged hilar lymph node. However, a pulmonary mass is not excluded. A three-month follow-up CT of the chest is recommended to confirm resolution of this finding. 4. Trace bilateral pleural effusions. 5. Cirrhosis with moderate volume of ascites. 6. Large fat and colon containing large left inguinal hernia. 7. Anasarca. Aortic Atherosclerosis (ICD10-I70.0). Electronically Signed   By: Constance Holster M.D.   On: 06/06/2020 00:36   CT ABDOMEN PELVIS W CONTRAST  Result Date: 06/06/2020 CLINICAL DATA:  Shortness of breath EXAM: CT ANGIOGRAPHY CHEST CT ABDOMEN AND PELVIS WITH CONTRAST TECHNIQUE: Multidetector CT imaging of the chest was performed using the standard protocol during bolus administration of intravenous contrast. Multiplanar CT image reconstructions and MIPs were obtained to evaluate the vascular anatomy. Multidetector CT imaging of the abdomen and pelvis was performed using the standard protocol during bolus administration of intravenous contrast. CONTRAST:  180mL OMNIPAQUE IOHEXOL 350 MG/ML SOLN COMPARISON:  None.  FINDINGS: CTA CHEST FINDINGS Cardiovascular: There is no acute pulmonary embolism. The heart size is normal without evidence for significant pericardial effusion. There is no evidence for thoracic aortic dissection or aneurysm. Mediastinum/Nodes: --there are mildly enlarged mediastinal and hilar lymph nodes. There is a 2.1 x 2.8 cm mass in the right perihilar region (axial series 2, image 58). This is favored to represent an enlarged lymph node. --No axillary lymphadenopathy. --No supraclavicular lymphadenopathy. --Normal thyroid gland. --the esophagus is fluid-filled to the level of the mid thorax. Lungs/Pleura: Extensive bibasilar consolidation is noted. There are extensive bilateral ground-glass airspace opacities in the remaining portions of the lungs bilaterally. There is a moderate amount of debris within the trachea and main left and right rhonchi. There are trace bilateral pleural effusions. There is no pneumothorax. Musculoskeletal: No chest wall abnormality. No acute or significant osseous findings. Review of the MIP images confirms the above findings. CT ABDOMEN and PELVIS FINDINGS Hepatobiliary: The liver is cirrhotic. Normal gallbladder.There is no biliary ductal dilation. Pancreas: Normal contours without ductal dilatation. No peripancreatic fluid  collection. Spleen: Unremarkable. Adrenals/Urinary Tract: --Adrenal glands: Unremarkable. --Right kidney/ureter: No hydronephrosis or radiopaque kidney stones. --Left kidney/ureter: No hydronephrosis or radiopaque kidney stones. --Urinary bladder: Unremarkable. Stomach/Bowel: --Stomach/Duodenum: No hiatal hernia or other gastric abnormality. Normal duodenal course and caliber. --Small bowel: Unremarkable. --Colon: Unremarkable. --Appendix: Normal. Vascular/Lymphatic: Atherosclerotic calcification is present within the non-aneurysmal abdominal aorta, without hemodynamically significant stenosis. --No retroperitoneal lymphadenopathy. --No mesenteric  lymphadenopathy. --No pelvic or inguinal lymphadenopathy. Reproductive: Unremarkable Other: There is a moderate volume of ascites. There is no free air. There is a fat and colon containing large left inguinal hernia. There is diffuse body wall edema. Musculoskeletal. No acute displaced fractures. Review of the MIP images confirms the above findings. IMPRESSION: 1. No acute pulmonary embolism. 2. Extensive bibasilar consolidation and diffuse bilateral ground-glass airspace opacities, concerning for multifocal pneumonia or aspiration. There is a moderate amount of debris within the trachea suggestive of aspiration. 3. Hilar and mediastinal adenopathy, presumably reactive in etiology. There is a 2.8 cm mass in the right hilar region favored to represent an enlarged hilar lymph node. However, a pulmonary mass is not excluded. A three-month follow-up CT of the chest is recommended to confirm resolution of this finding. 4. Trace bilateral pleural effusions. 5. Cirrhosis with moderate volume of ascites. 6. Large fat and colon containing large left inguinal hernia. 7. Anasarca. Aortic Atherosclerosis (ICD10-I70.0). Electronically Signed   By: Constance Holster M.D.   On: 06/06/2020 00:36   US Paracentesis  Result Date: 06/06/2020 INDICATION: 59 year old with ascites. EXAM: ULTRASOUND GUIDED PARACENTESIS MEDICATIONS: None. COMPLICATIONS: None immediate. PROCEDURE: Informed written consent was obtained from the patient after a discussion of the risks, benefits and alternatives to treatment. A timeout was performed prior to the initiation of the procedure. Initial ultrasound scanning demonstrates a large amount of ascites within the right lower abdominal quadrant. The right lower abdomen was prepped and draped in the usual sterile fashion. 1% lidocaine was used for local anesthesia. Following this, a 6 Fr Safe-T-Centesis catheter was introduced. An ultrasound image was saved for documentation purposes. The paracentesis was  performed. The catheter was removed and a dressing was applied. The patient tolerated the procedure well without immediate post procedural complication. Patient received post-procedure intravenous albumin; see nursing notes for details. FINDINGS: A total of approximately 2.5 L of yellow fluid was removed. Samples were sent to the laboratory as requested by the clinical team. IMPRESSION: Successful ultrasound-guided paracentesis yielding 2.5 liters of peritoneal fluid. Electronically Signed   By: Markus Daft M.D.   On: 06/06/2020 15:55   DG Chest Port 1 View  Result Date: 06/05/2020 CLINICAL DATA:  Shortness of breath EXAM: PORTABLE CHEST 1 VIEW COMPARISON:  None. FINDINGS: There is diffuse interstitial thickening. No airspace consolidation. No appreciable pleural effusion. Heart size and pulmonary vascularity within normal limits. No adenopathy. No bone lesions. IMPRESSION: Diffuse interstitial thickening. Suspect a degree of underlying fibrosis. Noncardiogenic pulmonary edema and atypical infection are differential considerations for this appearance. No airspace consolidation noted. Cardiac silhouette within normal limits.  No adenopathy appreciable. Electronically Signed   By: Lowella Grip III M.D.   On: 06/05/2020 10:12   ECHOCARDIOGRAM COMPLETE  Result Date: 06/05/2020    ECHOCARDIOGRAM REPORT   Patient Name:   DEFORREST BOGLE Date of Exam: 06/05/2020 Medical Rec #:  [id number]        Height:       69.0 in Accession #:    [id number]       Weight:       217.0  lb Date of Birth:  17-Jan-1961        BSA:          2.139 m Patient Age:    46 years         BP:           106/62 mmHg Patient Gender: M                HR:           106 bpm. Exam Location:  [facility] Procedure: 2D Echo, Cardiac Doppler and Color Doppler Indications:     Elevated troponin  History:         Patient has no prior history of Echocardiogram examinations. No                  cardiac history listed in chart.  Sonographer:     Sherrie Sport RDCS (AE)  Referring Phys:  Baker Janus Soledad Gerlach NIU Diagnosing Phys: Nelva Bush MD  Sonographer Comments: Technically difficult study due to poor echo windows, no parasternal window and no apical window. Pt on Bipap. IMPRESSIONS  1. Left ventricular ejection fraction, by estimation, is >55%. The left ventricle has normal function. Left ventricular endocardial border not optimally defined to evaluate regional wall motion. There is moderate left ventricular hypertrophy. Left ventricular diastolic function could not be evaluated.  2. Right ventricular systolic function is normal. The right ventricular size is normal.  3. The mitral valve is grossly normal. No evidence of mitral valve regurgitation.  4. The aortic valve was not well visualized. Aortic valve regurgitation not well assessed.  5. Pulmonic valve regurgitation not well assessed. FINDINGS  Left Ventricle: Left ventricular ejection fraction, by estimation, is >55%. The left ventricle has normal function. Left ventricular endocardial border not optimally defined to evaluate regional wall motion. The left ventricular internal cavity size was  normal in size. There is moderate left ventricular hypertrophy. Left ventricular diastolic function could not be evaluated. Right Ventricle: The right ventricular size is normal. Right vetricular wall thickness was not assessed. Right ventricular systolic function is normal. Left Atrium: Left atrial size was not well visualized. Right Atrium: Right atrial size was not well visualized. Pericardium: A small pericardial effusion is present. Mitral Valve: The mitral valve is grossly normal. No evidence of mitral valve regurgitation. Tricuspid Valve: The tricuspid valve is not well visualized. Tricuspid valve regurgitation is trivial. Aortic Valve: The aortic valve was not well visualized. Aortic valve regurgitation not well assessed. Pulmonic Valve: The pulmonic valve was not well visualized. Pulmonic valve regurgitation not well assessed.  Aorta: The aortic root was not well visualized. Pulmonary Artery: The pulmonary artery is not well seen. Venous: The inferior vena cava was not well visualized. IAS/Shunts: The interatrial septum was not well visualized.  LEFT VENTRICLE PLAX 2D LVIDd:         4.79 cm LVIDs:         2.95 cm LV PW:         1.59 cm LV IVS:        1.36 cm  LEFT ATRIUM         Index LA diam:    3.90 cm 1.82 cm/m  TRICUSPID VALVE TR Peak grad:   16.6 mmHg TR Vmax:        204.00 cm/s Nelva Bush MD Electronically signed by Nelva Bush MD Signature Date/Time: 06/05/2020/3:42:19 PM    Final    [place] EKG SITE RITE  Result Date: 06/07/2020 If Site Rite image not  attached, placement could not be confirmed due to current cardiac rhythm.     Assessment & Plan: Mr. Alton Bouknight is a 59 y.o. Hispanic male with alcohol abuse, hypertension, diabetes mellitus type II, anemia, tobacco use, who was admitted to [facility] on 06/05/2020 for Shortness of breath [R06.02] Acute respiratory failure with hypoxia ([place]) [J96.01] Community acquired pneumonia, unspecified laterality [J18.9]  1. Anasarca: secondary to hepatic cirrhosis. Albumin of 1.7. Echocardiogram with preserved systolic function.  Renal function within normal limits.  Received IV albumin x 2 yesterday.  Furosemide 60mg  IV x 1 yesterday.  Status post paracentesis yesterday 2.5 liters removed.  - change to furosemide gtt - IV albumin 25mg  q8 x three days.  - will recheck renal function and hepatic function today.  - Most likely will start spironolactone tomorrow.   2. 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 On CIWA protocol. Currently on PO thiamine and folate.   3. Anemia: normocytic. With GI bleed.   4. Peritonitis: spontaneous.  - empiric antibiotics.   LOS: 2 Landis Cassaro 6/10/20219:03 AM

## 2020-06-07 NOTE — Progress Notes (Signed)
SYNOPSIS [name] with acute hypoxia with acute interstitial infiltrates with abd distention and lower ext edema, findings are concerning for progressive liver failure    CC  Follow up hypoxia   HPI Dx of liver cirrhosis Poor prognosis On bipap for hypoxia Alert and awake    EVENTS 6/8 admitted for hypoxia 6/9 Dx of liver cirrhosis and ascites, s/p paracentesis Albumin and lasix therapy 6/10 placed on biPAP, alert and awake   VITALS: BP 106/64   Pulse 91   Temp 98 F (36.7 C)   Resp (!) 23   Ht 5\' 9"  (1.753 m)   Wt 101.3 kg   SpO2 96%   BMI 32.98 kg/m     I/O last 3 completed shifts: In: 730.8 [P.O.:607; I.V.:0.1; IV Piggyback:123.7] Out: -  No intake/output data recorded.  SpO2: 96 % O2 Flow Rate (L/min): 50 L/min FiO2 (%): 50 %  Estimated body mass index is 32.98 kg/m as calculated from the following:   Height as of this encounter: 5\' 9"  (1.753 m).   Weight as of this encounter: 101.3 kg.   Review of Systems:  Gen:  Denies  fever, sweats, chills weigh loss  HEENT: Denies blurred vision, double vision, ear pain, eye pain, hearing loss, nose bleeds, sore throat Cardiac:  No dizziness, chest pain or heaviness, chest tightness,edema, No JVD Resp:   No cough, -sputum production, +shortness of breath,-wheezing, -hemoptysis,  Gi: Denies swallowing difficulty, stomach pain, nausea or vomiting, diarrhea, constipation, bowel incontinence Gu:  Denies bladder incontinence, burning urine Ext:   Denies Joint pain, stiffness or swelling Skin: Denies  skin rash, easy bruising or bleeding or hives Endoc:  Denies polyuria, polydipsia , polyphagia or weight change Psych:   Denies depression, insomnia or hallucinations  Other:  All other systems negative     Physical Examination:   General Appearance: +distress  Neuro:without focal findings,  speech normal,  HEENT: PERRLA, EOM intact.   Pulmonary: normal breath sounds, No wheezing.   CardiovascularNormal S1,S2.  No m/r/g.   Abdomen:+distended Renal:  No costovertebral tenderness  GU:  Not performed at this time. Endoc: No evident thyromegaly Skin:   warm, no rashes, no ecchymosis  Extremities: normal, no cyanosis, clubbing. PSYCHIATRIC: Mood, affect within normal limits.     I personally reviewed Labs under Results section.   MEDICATIONS: I have reviewed all medications and confirmed regimen as documented   CULTURE RESULTS   Recent Results (from the past 240 hour(s))  SARS Coronavirus 2 by RT PCR (hospital order, performed in [facility] hospital lab) Nasopharyngeal Nasopharyngeal Swab     Status: None   Collection Time: 06/05/20  9:33 AM   Specimen: Nasopharyngeal Swab  Result Value Ref Range Status   SARS Coronavirus 2 NEGATIVE NEGATIVE Final    Comment: (NOTE) SARS-CoV-2 target nucleic acids are NOT DETECTED. The SARS-CoV-2 RNA is generally detectable in upper and lower respiratory specimens during the acute phase of infection. The lowest concentration of SARS-CoV-2 viral copies this assay can detect is 250 copies / mL. A negative result does not preclude SARS-CoV-2 infection and should not be used as the sole basis for treatment or other patient management decisions.  A negative result may occur with improper specimen collection / handling, submission of specimen other than nasopharyngeal swab, presence of viral mutation(s) within the areas targeted by this assay, and inadequate number of viral copies (<250 copies / mL). A negative result must be combined with clinical observations, patient history, and epidemiological information.  Fact Sheet for Patients:   StrictlyIdeas.no Fact Sheet for Healthcare Providers: BankingDealers.co.za This test is not yet approved or cleared  by the [place] FDA and has been authorized for detection and/or diagnosis of SARS-CoV-2 by FDA under an Emergency Use  Authorization (EUA).  This EUA will remain in effect (meaning this test can be used) for the duration of the COVID-19 declaration under Section 564(b)(1) of the Act, 21 U.S.C. section 360bbb-3(b)(1), unless the authorization is terminated or revoked sooner. Performed at [facility], [address] [place], [place] [address]   Blood culture (routine x 2)     Status: None (Preliminary result)   Collection Time: 06/05/20 10:26 AM   Specimen: BLOOD  Result Value Ref Range Status   Specimen Description   Final    BLOOD Blood Culture results may not be optimal due to an excessive volume of blood received in culture bottles   Special Requests   Final    BOTTLES DRAWN AEROBIC AND ANAEROBIC LEFT ANTECUBITAL   Culture   Final    NO GROWTH < 24 HOURS Performed at [facility], [address] [place], [place] [address]    Report Status PENDING  Incomplete  Blood culture (routine x 2)     Status: None (Preliminary result)   Collection Time: 06/05/20 10:26 AM   Specimen: BLOOD  Result Value Ref Range Status   Specimen Description   Final    BLOOD Blood Culture results may not be optimal due to an excessive volume of blood received in culture bottles   Special Requests   Final    BOTTLES DRAWN AEROBIC AND ANAEROBIC RIGHT ANTECUBITAL   Culture   Final    NO GROWTH < 24 HOURS Performed at [facility], [address] [place], [place] [address]    Report Status PENDING  Incomplete  MRSA PCR Screening     Status: None   Collection Time: 06/05/20  2:42 PM   Specimen: Nasopharyngeal  Result Value Ref Range Status   MRSA by PCR NEGATIVE NEGATIVE Final    Comment:        The GeneXpert MRSA Assay (FDA approved for NASAL specimens only), is one component of a comprehensive MRSA colonization surveillance program. It is not intended to diagnose MRSA infection nor to guide or monitor treatment for MRSA infections. Performed at [facility], [address] [place], [place] [address]           IMAGING    US Paracentesis  Result Date: 06/06/2020 INDICATION: 59 year old with ascites. EXAM: ULTRASOUND GUIDED PARACENTESIS MEDICATIONS: None. COMPLICATIONS: None immediate. PROCEDURE: Informed written consent was obtained from the patient after a discussion of the risks, benefits and alternatives to treatment. A timeout was performed prior to the initiation of the procedure. Initial ultrasound scanning demonstrates a large amount of ascites within the right lower abdominal quadrant. The right lower abdomen was prepped and draped in the usual sterile fashion. 1% lidocaine was used for local anesthesia. Following this, a 6 Fr Safe-T-Centesis catheter was introduced. An ultrasound image was saved for documentation purposes. The paracentesis was performed. The catheter was removed and a dressing was applied. The patient tolerated the procedure well without immediate post procedural complication. Patient received post-procedure intravenous albumin; see nursing notes for details. FINDINGS: A total of approximately 2.5 L of yellow fluid was removed. Samples were sent to the laboratory as requested by the clinical team. IMPRESSION: Successful ultrasound-guided paracentesis yielding 2.5 liters of peritoneal fluid.  Electronically Signed   By: Markus Daft M.D.   On: 06/06/2020 15:55   BMP Latest Ref Rng & Units 06/06/2020 06/05/2020  Glucose 70 - 99 mg/dL 145(H) 183(H)  BUN 6 - 20 mg/dL 10 9  Creatinine 0.61 - 1.24 mg/dL 0.68 0.83  Sodium 135 - 145 mmol/L 130(L) 129(L)  Potassium 3.5 - 5.1 mmol/L 3.5 4.0  Chloride 98 - 111 mmol/L 97(L) 97(L)  CO2 22 - 32 mmol/L 29 25  Calcium 8.9 - 10.3 mg/dL 6.6(L) 6.8(L)         ASSESSMENT AND PLAN SYNOPSIS [name] with acute hypoxia with acute interstitial infiltrates with abd distention and lower ext edema, findings are concerning for progressive liver failure with possible systolic heart failure  Severe  ACUTE Hypoxic and Hypercapnic Respiratory Failure from pneumonia Wean off biPAP Oxygen as needed Continue IV abx Will start IV steroids Continue BD therapy  SEVERE COPD EXACERBATION -continue IV steroids as prescribed -continue NEB THERAPY as prescribed -morphine as needed -wean fio2 as needed and tolerated  LIVER CIRRHOSIS +GIB Consider GI consultation for medical management Start PPI and OCTREOTIDE  OBTAIN PICC LINE  ELECTROLYTES -follow labs as needed -replace as needed -pharmacy consultation and following   DVT/GI PRX ordered and assessed TRANSFUSIONS AS NEEDED MONITOR FSBS I Assessed the need for Labs I Assessed the need for Foley I Assessed the need for Central Venous Line Family Discussion when available I Assessed the need for Mobilization I made an Assessment of medications to be adjusted accordingly Safety Risk assessment completed  Prognosis is guarded  Corrin Parker, M.D.  [facility]  Medical Director [place] Director [facility] Cardio-Pulmonary Department

## 2020-06-07 NOTE — Progress Notes (Signed)
[facility] visited pt. per RN referral; pt. admitted w/multiple serious medical issues having not gone to MD for many years, RN said.  Pt. sitting in bed w/significant other of 30+ yrs. in chair at bedside.  Marion introduced himself and asked if pt. was interested in completing paperwork to assign HCPOA (since S.O. is not legally married to pt.).  Pt. did want to complete MPOA and assigned S.O Christine as primary MPOA and her son Gwyndolyn Saxon as alt. MPOA.  When [facility] discussed Living Will w/pt. it became clear that pt. did not want life-prolonging treatment discontinued under any of the scenarios described and wanted full measures of care.  [place] advised pt. not to complete Living Will --> crossed it out in document. Document signed in presence of notary and witnesses; copy placed in paper chart and scanned into [facility]. [place] visited periodically with S.O. Altha Harm and 'grandson' (S.O. son) Gwyndolyn Saxon throughout the afternoon; learned that pt. has not been to the doctor in many, many years but recently came to [facility] after experiencing swelling in his abdomen and severe shortness of breath.  CH present when Dr. Mortimer Fries entered rm. to offer family prognosis for pt; Dr. shared pt. has severe cirrhosis of liver due to ETOH abuse, and that this has contributed to other significant health problems; pt.'s condition extremely fragile, Dr. shared.  Family tearful in hearing this news; pt. appeared shocked and overwhelmed.  Dr. Alice Reichert gastroenterologist also visited to discuss liver and GI bleeding more specifically.  Gwyndolyn Saxon had difficulty staying in rm. --> said being in hospital brings back memories of another family member's death.  Gwyndolyn Saxon and Christine endorse pt. has alcohol problem.   Pt. seemed not to have fully processed news given by doctors when [facility] attempted to assess how he was coping; 'it sounds like it's going to be alright' he said.  Pt. acknowledges he should have gone to the doctor more frequently and says if he 'gets through this' he  will change life patterns.  CH will continue to monitor pt. and family needs as possible as they arise.

## 2020-06-07 NOTE — Progress Notes (Addendum)
Patient ID: Collin Byrd, male   DOB: 12/04/1961, 59 y.o.   MRN: [id number]  pt is critically ill and now transferred under Dr Zoila Shutter service.

## 2020-06-07 NOTE — Consult Note (Signed)
Consultation Note Date: 06/07/2020   Patient Name: Collin Byrd  DOB: 1961-08-29  MRN: [id number]  Age / Sex: 59 y.o., male  PCP: Patient, No Pcp Per Referring Physician: Flora Lipps, MD  Reason for Consultation: Establishing goals of care  HPI/Patient Profile:  Collin Byrd is a 59 y.o. male without significant past medical history except for alcohol abuse (8-12 beers/day), who presents with chest pain, shortness of breath, bilateral leg edema and leg pain.  Clinical Assessment and Goals of Care: Patient is sitting in bedside chair. His girlfriend and her son are present and have just spoke with CCM. They discuss the updates they just received and then left the room. He has a long term significant other. He has a sister in [place] and brother in [place]. He has a grown child, but he states they have nothing to do with each other.   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 Over the past year, he has developed worsening DOE. It is difficult for him to clean as a profession.  We discussed his diagnosis, prognosis, GOC, EOL wishes disposition and options.  A detailed discussion was had today regarding advanced directives.  Concepts specific to code status, artifical feeding and hydration, IV antibiotics and rehospitalization were discussed.  The difference between an aggressive medical intervention path and a comfort care path was discussed.  Values and goals of care important to patient and family were attempted to be elicited.  Discussed limitations of medical interventions to prolong quality of life in some situations and discussed the concept of human mortality.  He states he would like all care possible and will modify his life style to try to live as long as possible.        SUMMARY OF RECOMMENDATIONS   Full code/full scope.  Recommend palliative at D/C.    Prognosis:   Unable to  determine      Primary Diagnoses: Present on Admission: . Acute respiratory failure with hypoxia ([place]) . Hyponatremia . Elevated troponin . Leukocytosis . Bilateral lower leg cellulitis . Chest pain . Sepsis ([place]) . Alcohol abuse   I have reviewed the medical record, interviewed the patient and family, and examined the patient. The following aspects are pertinent.  Past Medical History:  Diagnosis Date  . Cataract    Social History   Socioeconomic History  . Marital status: Single    Spouse name: Not on file  . Number of children: Not on file  . Years of education: Not on file  . Highest education level: Not on file  Occupational History  . Not on file  Tobacco Use  . Smoking status: Current Every Day Smoker    Packs/day: 0.25    Types: Cigarettes  . Smokeless tobacco: Never Used  Vaping Use  . Vaping Use: Never used  Substance and Sexual Activity  . Alcohol use: Yes    Alcohol/week: 21.0 standard drinks    Types: 21 Cans of beer per week  . Drug use: Never  . Sexual activity: Not  on file  Other Topics Concern  . Not on file  Social History Narrative  . Not on file   Social Determinants of Health   Financial Resource Strain:   . Difficulty of Paying Living Expenses:   Food Insecurity:   . Worried About Charity fundraiser in the Last Year:   . Arboriculturist in the Last Year:   Transportation Needs:   . Film/video editor (Medical):   Marland Kitchen Lack of Transportation (Non-Medical):   Physical Activity:   . Days of Exercise per Week:   . Minutes of Exercise per Session:   Stress:   . Feeling of Stress :   Social Connections:   . Frequency of Communication with Friends and Family:   . Frequency of Social Gatherings with Friends and Family:   . Attends Religious Services:   . Active Member of Clubs or Organizations:   . Attends Archivist Meetings:   Marland Kitchen Marital Status:    Family History  Problem Relation Age of Onset  . Heart disease Neg  Hx    Scheduled Meds: . aspirin EC  81 mg Oral Daily  . azithromycin  250 mg Oral Daily  . budesonide (PULMICORT) nebulizer solution  0.5 mg Nebulization BID  . Chlorhexidine Gluconate Cloth  6 each Topical Daily  . ferrous sulfate  325 mg Oral BID WC  . folic acid  1 mg Oral Daily  . insulin aspart  0-15 Units Subcutaneous TID WC  . insulin aspart  0-5 Units Subcutaneous QHS  . ipratropium-albuterol  3 mL Nebulization Q6H  . LORazepam  0-4 mg Intravenous Q6H   Followed by  . LORazepam  0-4 mg Intravenous Q12H  . methylPREDNISolone (SOLU-MEDROL) injection  40 mg Intravenous Q12H  . multivitamin with minerals  1 tablet Oral Daily  . octreotide  100 mcg Intravenous Once  . pantoprazole (PROTONIX) IV  40 mg Intravenous Q12H  . pneumococcal 23 valent vaccine  0.5 mL Intramuscular Tomorrow-1000  . sodium chloride flush  10-40 mL Intracatheter Q12H  . thiamine injection  100 mg Intravenous Daily   And  . thiamine  100 mg Oral Daily   Continuous Infusions: . sodium chloride 250 mL (06/07/20 1156)  . albumin human 25 g (06/07/20 1543)  . cefTRIAXone (ROCEPHIN)  IV 2 g (06/07/20 1158)  . furosemide (LASIX) infusion 8 mg/hr (06/07/20 1153)  . octreotide  (SANDOSTATIN)    IV infusion     PRN Meds:.sodium chloride, acetaminophen **OR** acetaminophen, albuterol, dextromethorphan-guaiFENesin, iohexol, LORazepam, LORazepam **OR** LORazepam, naphazoline-glycerin, nitroGLYCERIN, ondansetron **OR** ondansetron (ZOFRAN) IV, sodium chloride flush Medications Prior to Admission:  Prior to Admission medications   Not on File   No Known Allergies Review of Systems  Constitutional: Positive for activity change.  Respiratory: Positive for shortness of breath.     Physical Exam Musculoskeletal:     Right lower leg: Edema present.     Left lower leg: Edema present.  Neurological:     Mental Status: He is alert.     Vital Signs: BP 112/62   Pulse (!) 107   Temp 97.7 F (36.5 C) (Oral)    Resp (!) 29   Ht 5\' 9"  (1.753 m)   Wt 101.3 kg   SpO2 95%   BMI 32.98 kg/m  Pain Scale: 0-10   Pain Score: 0-No pain   SpO2: SpO2: 95 % O2 Device:SpO2: 95 % O2 Flow Rate: .O2 Flow Rate (L/min): 50 L/min  IO: Intake/output summary:  Intake/Output Summary (Last 24 hours) at 06/07/2020 1555 Last data filed at 06/07/2020 0300 Gross per 24 hour  Intake 692.11 ml  Output --  Net 692.11 ml    LBM: Last BM Date: 06/07/20 Baseline Weight: Weight: 98.4 kg Most recent weight: Weight: 101.3 kg     Palliative Assessment/Data:     Time In: 3:30 Time Out: 4:05 Time Total: 35 min Greater than 50%  of this time was spent counseling and coordinating care related to the above assessment and plan.  Signed by: Asencion Gowda, NP   Please contact Palliative Medicine Team phone at [phone] for questions and concerns.  For individual provider: See Shea Evans

## 2020-06-07 NOTE — Progress Notes (Signed)
Pt. Placed Back on BIPAP due to desaturation on HFNC 50L @ 70%  Below 88%. After placing on BIPAP Pt. Less short of breath resting comfortably with improved work of breathing. SpO2 improved to 94%. Will continue to assess.

## 2020-06-07 NOTE — Progress Notes (Signed)
[facility] Gastroenterology Inpatient Progress Note  Subjective: Patient seen for f/u SBP, ascites, abdominal pain. The attending intensivist, Dr. Mortimer Fries, alerted me the patient had large bloody stools this AM and another large "red blood" stool this afternoon. Patient denies any significant abdominal pain at this time. He is SOB and is on Bipap. He is alert and oriented and speaks adequate english and acknowledges understanding when speaking and answering questions.  Objective: Vital signs in last 24 hours: Temp:  [97.7 F (36.5 C)-98 F (36.7 C)] 97.7 F (36.5 C) (06/10 0743) Pulse Rate:  [81-109] 107 (06/10 1500) Resp:  [17-29] 29 (06/10 1500) BP: (95-128)/(54-74) 112/62 (06/10 1500) SpO2:  [88 %-97 %] 95 % (06/10 1500) FiO2 (%):  [33 %-80 %] 76 % (06/10 1355) Weight:  [101.3 kg] 101.3 kg (06/10 0301) Blood pressure 112/62, pulse (!) 107, temperature 97.7 F (36.5 C), temperature source Oral, resp. rate (!) 29, height 5\' 9"  (1.753 m), weight 101.3 kg, SpO2 95 %.    Intake/Output from previous day: 06/09 0701 - 06/10 0700 In: 730.8 [P.O.:607; I.V.:0.1; IV Piggyback:123.7] Out: -   Intake/Output this shift: No intake/output data recorded.   General appearance:  Ill-appearing hispanic male sitting up in chair.  Resp: Coarse Breath sounds. Some rhonchi. Cardio:Slightly tachy, no gallop GI:  Distended with large fluid wave. Tender without rebound. BS+ Extremities: 2+ edema. Neuro: Alert and oriented x 3. Nonfocal   Lab Results: Results for orders placed or performed during the hospital encounter of 06/05/20 (from the past 24 hour(s))  Protime-INR     Status: Abnormal   Collection Time: 06/06/20  6:21 PM  Result Value Ref Range   Prothrombin Time 16.7 (H) 11.4 - 15.2 seconds   INR 1.4 (H) 0.8 - 1.2  Ferritin     Status: None   Collection Time: 06/06/20  6:21 PM  Result Value Ref Range   Ferritin 45 24 - 336 ng/mL  Iron and TIBC     Status: Abnormal   Collection Time:  06/06/20  6:21 PM  Result Value Ref Range   Iron 9 (L) 45 - 182 ug/dL   TIBC 242 (L) 250 - 450 ug/dL   Saturation Ratios 4 (L) 17.9 - 39.5 %   UIBC 233 ug/dL  CBC with Differential/Platelet     Status: Abnormal   Collection Time: 06/07/20  8:14 AM  Result Value Ref Range   WBC 14.6 (H) 4.0 - 10.5 K/uL   RBC 3.38 (L) 4.22 - 5.81 MIL/uL   Hemoglobin 10.0 (L) 13.0 - 17.0 g/dL   HCT 31.9 (L) 39 - 52 %   MCV 94.4 80.0 - 100.0 fL   MCH 29.6 26.0 - 34.0 pg   MCHC 31.3 30.0 - 36.0 g/dL   RDW 14.4 11.5 - 15.5 %   Platelets 366 150 - 400 K/uL   nRBC 0.1 0.0 - 0.2 %   Neutrophils Relative % 83 %   Neutro Abs 12.1 (H) 1.7 - 7.7 K/uL   Lymphocytes Relative 6 %   Lymphs Abs 0.8 0.7 - 4.0 K/uL   Monocytes Relative 9 %   Monocytes Absolute 1.3 (H) 0 - 1 K/uL   Eosinophils Relative 1 %   Eosinophils Absolute 0.1 0 - 0 K/uL   Basophils Relative 0 %   Basophils Absolute 0.1 0 - 0 K/uL   Immature Granulocytes 1 %   Abs Immature Granulocytes 0.18 (H) 0.00 - 0.07 K/uL  Comprehensive metabolic panel     Status: Abnormal  Collection Time: 06/07/20  8:14 AM  Result Value Ref Range   Sodium 133 (L) 135 - 145 mmol/L   Potassium 3.9 3.5 - 5.1 mmol/L   Chloride 97 (L) 98 - 111 mmol/L   CO2 30 22 - 32 mmol/L   Glucose, Bld 142 (H) 70 - 99 mg/dL   BUN 8 6 - 20 mg/dL   Creatinine, Ser 0.54 (L) 0.61 - 1.24 mg/dL   Calcium 7.0 (L) 8.9 - 10.3 mg/dL   Total Protein 5.2 (L) 6.5 - 8.1 g/dL   Albumin 1.7 (L) 3.5 - 5.0 g/dL   AST 22 15 - 41 U/L   ALT 20 0 - 44 U/L   Alkaline Phosphatase 50 38 - 126 U/L   Total Bilirubin 0.9 0.3 - 1.2 mg/dL   GFR calc non Af Amer >60 >60 mL/min   GFR calc Af Amer >60 >60 mL/min   Anion gap 6 5 - 15  Glucose, capillary     Status: Abnormal   Collection Time: 06/07/20 11:20 AM  Result Value Ref Range   Glucose-Capillary 228 (H) 70 - 99 mg/dL     Recent Labs    06/05/20 0933 06/06/20 0106 06/07/20 0814  WBC 31.9* 19.1* 14.6*  HGB 10.9* 9.4* 10.0*  HCT 34.5*  29.8* 31.9*  PLT 452* 340 366   BMET Recent Labs    06/05/20 0933 06/06/20 0106 06/07/20 0814  NA 129* 130* 133*  K 4.0 3.5 3.9  CL 97* 97* 97*  CO2 25 29 30   GLUCOSE 183* 145* 142*  BUN 9 10 8   CREATININE 0.83 0.68 0.54*  CALCIUM 6.8* 6.6* 7.0*   LFT Recent Labs    06/05/20 0933 06/05/20 0933 06/07/20 0814  PROT 5.7*   < > 5.2*  ALBUMIN 1.7*  1.7*   < > 1.7*  AST 25   < > 22  ALT 22   < > 20  ALKPHOS 59   < > 50  BILITOT 1.0   < > 0.9  BILIDIR 0.2  --   --   IBILI 0.8  --   --    < > = values in this interval not displayed.   PT/INR Recent Labs    06/06/20 1821  LABPROT 16.7*  INR 1.4*   Hepatitis Panel Recent Labs    06/05/20 1533  HEPBSAG NON REACTIVE  HCVAB NON REACTIVE  HEPAIGM NON REACTIVE  HEPBIGM NON REACTIVE   C-Diff No results for input(s): CDIFFTOX in the last 72 hours. No results for input(s): CDIFFPCR in the last 72 hours.   Studies/Results: CT Angio Chest PE W and/or Wo Contrast  Result Date: 06/06/2020 CLINICAL DATA:  Shortness of breath EXAM: CT ANGIOGRAPHY CHEST CT ABDOMEN AND PELVIS WITH CONTRAST TECHNIQUE: Multidetector CT imaging of the chest was performed using the standard protocol during bolus administration of intravenous contrast. Multiplanar CT image reconstructions and MIPs were obtained to evaluate the vascular anatomy. Multidetector CT imaging of the abdomen and pelvis was performed using the standard protocol during bolus administration of intravenous contrast. CONTRAST:  11mL OMNIPAQUE IOHEXOL 350 MG/ML SOLN COMPARISON:  None. FINDINGS: CTA CHEST FINDINGS Cardiovascular: There is no acute pulmonary embolism. The heart size is normal without evidence for significant pericardial effusion. There is no evidence for thoracic aortic dissection or aneurysm. Mediastinum/Nodes: --there are mildly enlarged mediastinal and hilar lymph nodes. There is a 2.1 x 2.8 cm mass in the right perihilar region (axial series 2, image 58). This is  favored to  represent an enlarged lymph node. --No axillary lymphadenopathy. --No supraclavicular lymphadenopathy. --Normal thyroid gland. --the esophagus is fluid-filled to the level of the mid thorax. Lungs/Pleura: Extensive bibasilar consolidation is noted. There are extensive bilateral ground-glass airspace opacities in the remaining portions of the lungs bilaterally. There is a moderate amount of debris within the trachea and main left and right rhonchi. There are trace bilateral pleural effusions. There is no pneumothorax. Musculoskeletal: No chest wall abnormality. No acute or significant osseous findings. Review of the MIP images confirms the above findings. CT ABDOMEN and PELVIS FINDINGS Hepatobiliary: The liver is cirrhotic. Normal gallbladder.There is no biliary ductal dilation. Pancreas: Normal contours without ductal dilatation. No peripancreatic fluid collection. Spleen: Unremarkable. Adrenals/Urinary Tract: --Adrenal glands: Unremarkable. --Right kidney/ureter: No hydronephrosis or radiopaque kidney stones. --Left kidney/ureter: No hydronephrosis or radiopaque kidney stones. --Urinary bladder: Unremarkable. Stomach/Bowel: --Stomach/Duodenum: No hiatal hernia or other gastric abnormality. Normal duodenal course and caliber. --Small bowel: Unremarkable. --Colon: Unremarkable. --Appendix: Normal. Vascular/Lymphatic: Atherosclerotic calcification is present within the non-aneurysmal abdominal aorta, without hemodynamically significant stenosis. --No retroperitoneal lymphadenopathy. --No mesenteric lymphadenopathy. --No pelvic or inguinal lymphadenopathy. Reproductive: Unremarkable Other: There is a moderate volume of ascites. There is no free air. There is a fat and colon containing large left inguinal hernia. There is diffuse body wall edema. Musculoskeletal. No acute displaced fractures. Review of the MIP images confirms the above findings. IMPRESSION: 1. No acute pulmonary embolism. 2. Extensive  bibasilar consolidation and diffuse bilateral ground-glass airspace opacities, concerning for multifocal pneumonia or aspiration. There is a moderate amount of debris within the trachea suggestive of aspiration. 3. Hilar and mediastinal adenopathy, presumably reactive in etiology. There is a 2.8 cm mass in the right hilar region favored to represent an enlarged hilar lymph node. However, a pulmonary mass is not excluded. A three-month follow-up CT of the chest is recommended to confirm resolution of this finding. 4. Trace bilateral pleural effusions. 5. Cirrhosis with moderate volume of ascites. 6. Large fat and colon containing large left inguinal hernia. 7. Anasarca. Aortic Atherosclerosis (ICD10-I70.0). Electronically Signed   By: Constance Holster M.D.   On: 06/06/2020 00:36   CT ABDOMEN PELVIS W CONTRAST  Result Date: 06/06/2020 CLINICAL DATA:  Shortness of breath EXAM: CT ANGIOGRAPHY CHEST CT ABDOMEN AND PELVIS WITH CONTRAST TECHNIQUE: Multidetector CT imaging of the chest was performed using the standard protocol during bolus administration of intravenous contrast. Multiplanar CT image reconstructions and MIPs were obtained to evaluate the vascular anatomy. Multidetector CT imaging of the abdomen and pelvis was performed using the standard protocol during bolus administration of intravenous contrast. CONTRAST:  159mL OMNIPAQUE IOHEXOL 350 MG/ML SOLN COMPARISON:  None. FINDINGS: CTA CHEST FINDINGS Cardiovascular: There is no acute pulmonary embolism. The heart size is normal without evidence for significant pericardial effusion. There is no evidence for thoracic aortic dissection or aneurysm. Mediastinum/Nodes: --there are mildly enlarged mediastinal and hilar lymph nodes. There is a 2.1 x 2.8 cm mass in the right perihilar region (axial series 2, image 58). This is favored to represent an enlarged lymph node. --No axillary lymphadenopathy. --No supraclavicular lymphadenopathy. --Normal thyroid gland.  --the esophagus is fluid-filled to the level of the mid thorax. Lungs/Pleura: Extensive bibasilar consolidation is noted. There are extensive bilateral ground-glass airspace opacities in the remaining portions of the lungs bilaterally. There is a moderate amount of debris within the trachea and main left and right rhonchi. There are trace bilateral pleural effusions. There is no pneumothorax. Musculoskeletal: No chest wall abnormality. No acute or significant  osseous findings. Review of the MIP images confirms the above findings. CT ABDOMEN and PELVIS FINDINGS Hepatobiliary: The liver is cirrhotic. Normal gallbladder.There is no biliary ductal dilation. Pancreas: Normal contours without ductal dilatation. No peripancreatic fluid collection. Spleen: Unremarkable. Adrenals/Urinary Tract: --Adrenal glands: Unremarkable. --Right kidney/ureter: No hydronephrosis or radiopaque kidney stones. --Left kidney/ureter: No hydronephrosis or radiopaque kidney stones. --Urinary bladder: Unremarkable. Stomach/Bowel: --Stomach/Duodenum: No hiatal hernia or other gastric abnormality. Normal duodenal course and caliber. --Small bowel: Unremarkable. --Colon: Unremarkable. --Appendix: Normal. Vascular/Lymphatic: Atherosclerotic calcification is present within the non-aneurysmal abdominal aorta, without hemodynamically significant stenosis. --No retroperitoneal lymphadenopathy. --No mesenteric lymphadenopathy. --No pelvic or inguinal lymphadenopathy. Reproductive: Unremarkable Other: There is a moderate volume of ascites. There is no free air. There is a fat and colon containing large left inguinal hernia. There is diffuse body wall edema. Musculoskeletal. No acute displaced fractures. Review of the MIP images confirms the above findings. IMPRESSION: 1. No acute pulmonary embolism. 2. Extensive bibasilar consolidation and diffuse bilateral ground-glass airspace opacities, concerning for multifocal pneumonia or aspiration. There is a  moderate amount of debris within the trachea suggestive of aspiration. 3. Hilar and mediastinal adenopathy, presumably reactive in etiology. There is a 2.8 cm mass in the right hilar region favored to represent an enlarged hilar lymph node. However, a pulmonary mass is not excluded. A three-month follow-up CT of the chest is recommended to confirm resolution of this finding. 4. Trace bilateral pleural effusions. 5. Cirrhosis with moderate volume of ascites. 6. Large fat and colon containing large left inguinal hernia. 7. Anasarca. Aortic Atherosclerosis (ICD10-I70.0). Electronically Signed   By: Constance Holster M.D.   On: 06/06/2020 00:36   US Paracentesis  Result Date: 06/06/2020 INDICATION: 59 year old with ascites. EXAM: ULTRASOUND GUIDED PARACENTESIS MEDICATIONS: None. COMPLICATIONS: None immediate. PROCEDURE: Informed written consent was obtained from the patient after a discussion of the risks, benefits and alternatives to treatment. A timeout was performed prior to the initiation of the procedure. Initial ultrasound scanning demonstrates a large amount of ascites within the right lower abdominal quadrant. The right lower abdomen was prepped and draped in the usual sterile fashion. 1% lidocaine was used for local anesthesia. Following this, a 6 Fr Safe-T-Centesis catheter was introduced. An ultrasound image was saved for documentation purposes. The paracentesis was performed. The catheter was removed and a dressing was applied. The patient tolerated the procedure well without immediate post procedural complication. Patient received post-procedure intravenous albumin; see nursing notes for details. FINDINGS: A total of approximately 2.5 L of yellow fluid was removed. Samples were sent to the laboratory as requested by the clinical team. IMPRESSION: Successful ultrasound-guided paracentesis yielding 2.5 liters of peritoneal fluid. Electronically Signed   By: Markus Daft M.D.   On: 06/06/2020 15:55   [place]  EKG SITE RITE  Result Date: 06/07/2020 If Site Rite image not attached, placement could not be confirmed due to current cardiac rhythm.   Scheduled Inpatient Medications:   . aspirin EC  81 mg Oral Daily  . azithromycin  250 mg Oral Daily  . budesonide (PULMICORT) nebulizer solution  0.5 mg Nebulization BID  . Chlorhexidine Gluconate Cloth  6 each Topical Daily  . ferrous sulfate  325 mg Oral BID WC  . folic acid  1 mg Oral Daily  . insulin aspart  0-15 Units Subcutaneous TID WC  . insulin aspart  0-5 Units Subcutaneous QHS  . ipratropium-albuterol  3 mL Nebulization Q6H  . LORazepam  0-4 mg Intravenous Q6H   Followed by  .  LORazepam  0-4 mg Intravenous Q12H  . methylPREDNISolone (SOLU-MEDROL) injection  40 mg Intravenous Q12H  . multivitamin with minerals  1 tablet Oral Daily  . octreotide  100 mcg Intravenous Once  . pantoprazole (PROTONIX) IV  40 mg Intravenous Q12H  . pneumococcal 23 valent vaccine  0.5 mL Intramuscular Tomorrow-1000  . sodium chloride flush  10-40 mL Intracatheter Q12H  . thiamine injection  100 mg Intravenous Daily   And  . thiamine  100 mg Oral Daily    Continuous Inpatient Infusions:   . sodium chloride 250 mL (06/07/20 1156)  . albumin human 25 g (06/07/20 1207)  . cefTRIAXone (ROCEPHIN)  IV 2 g (06/07/20 1158)  . furosemide (LASIX) infusion 8 mg/hr (06/07/20 1153)  . octreotide  (SANDOSTATIN)    IV infusion      PRN Inpatient Medications:  sodium chloride, acetaminophen **OR** acetaminophen, albuterol, dextromethorphan-guaiFENesin, iohexol, LORazepam, LORazepam **OR** LORazepam, naphazoline-glycerin, nitroGLYCERIN, ondansetron **OR** ondansetron (ZOFRAN) IV, sodium chloride flush    Assessment:  1. Spontaneous bacterial peritonitis - ON Rocephin. 2. Large volume ascites - s/p paracentesis. On Lasix gtt.  3. Gastrointestinal bleeding - Octreotide ordered.  4. 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  Plan:  1. Begin IV octreotide bolus  and infusion. 2. NPO except ice chips/sips. 3. Add spironolactone. 4. Repeat abdominal paracentesis tomorrow. 5. Will follow. 6. Agree with IV albumin.  Collin Byrd K. Alice Reichert, M.D. 06/07/2020, 3:41 PM

## 2020-06-07 NOTE — Consult Note (Signed)
PHARMACY CONSULT NOTE - FOLLOW UP  Pharmacy Consult for Electrolyte Monitoring and Replacement   Recent Labs: Potassium (mmol/L)  Date Value  06/07/2020 3.9   Magnesium (mg/dL)  Date Value  06/05/2020 1.7   Calcium (mg/dL)  Date Value  06/07/2020 7.0 (L)   Albumin (g/dL)  Date Value  06/07/2020 1.7 (L)   Phosphorus (mg/dL)  Date Value  06/05/2020 2.4 (L)   Sodium (mmol/L)  Date Value  06/07/2020 133 (L)     Assessment: Patient is a 59 y/o M with medical history including EtOH abuse who is admitted to the ICU with acute respiratory failure, anasarca, hypovolemic hyponatremia. Now with new diagnosis of liver cirrhosis. He is s/p paracentesis on 6/9 and 2.5L was removed. He is being treated with antibiotics for SBP, lower leg cellulitis, and pneumonia.   Started on Lasix drip + IV albumin today for fluid overload. Tentative plan is to start spironolactone tomorrow. He is on heart healthy diet with 1.2L fluid restriction. Renal function is good with CrCl >100 mL/min. LBM 6/10.   Goal of Therapy:  Electrolytes within normal limits  Plan:  -K 3.9 this morning, will give potassium 40 mEq x 1 in anticipation of electrolyte depletion with diuresis -CMP ordered for tomorrow. Will add on Mg and [place] Resident 06/07/2020 12:59 PM

## 2020-06-07 NOTE — Progress Notes (Signed)
Peripherally Inserted Central Catheter Placement  The IV Nurse has discussed with the patient and/or persons authorized to consent for the patient, the purpose of this procedure and the potential benefits and risks involved with this procedure.  The benefits include less needle sticks, lab draws from the catheter, and the patient may be discharged home with the catheter. Risks include, but not limited to, infection, bleeding, blood clot (thrombus formation), and puncture of an artery; nerve damage and irregular heartbeat and possibility to perform a PICC exchange if needed/ordered by physician.  Alternatives to this procedure were also discussed.  Bard Power PICC patient education guide, fact sheet on infection prevention and patient information card has been provided to patient /or left at bedside.    PICC Placement Documentation  PICC Double Lumen 10/24/24 PICC Right Basilic 42 cm 2 cm (Active)  Indication for Insertion or Continuance of Line Prolonged intravenous therapies 06/07/20 1000  Exposed Catheter (cm) 2 cm 06/07/20 1000  Site Assessment Clean;Dry;Intact 06/07/20 1000  Lumen #1 Status Flushed;Blood return noted 06/07/20 1000  Lumen #2 Status Flushed;Blood return noted 06/07/20 1000  Dressing Type Transparent 06/07/20 1000  Dressing Status Clean;Dry;Intact;Antimicrobial disc in place 06/07/20 1000  Dressing Change Due 06/14/20 06/07/20 1000       Jule Economy Horton 06/07/2020, 10:55 AM

## 2020-06-08 ENCOUNTER — Inpatient Hospital Stay: Payer: Self-pay

## 2020-06-08 LAB — CBC
HCT: 27.2 % — ABNORMAL LOW (ref 39.0–52.0)
Hemoglobin: 8.8 g/dL — ABNORMAL LOW (ref 13.0–17.0)
MCH: 29.4 pg (ref 26.0–34.0)
MCHC: 32.4 g/dL (ref 30.0–36.0)
MCV: 91 fL (ref 80.0–100.0)
Platelets: 302 10*3/uL (ref 150–400)
RBC: 2.99 MIL/uL — ABNORMAL LOW (ref 4.22–5.81)
RDW: 14.3 % (ref 11.5–15.5)
WBC: 12.7 10*3/uL — ABNORMAL HIGH (ref 4.0–10.5)
nRBC: 0 % (ref 0.0–0.2)

## 2020-06-08 LAB — BASIC METABOLIC PANEL
Anion gap: 12 (ref 5–15)
BUN: 9 mg/dL (ref 6–20)
CO2: 35 mmol/L — ABNORMAL HIGH (ref 22–32)
Calcium: 7 mg/dL — ABNORMAL LOW (ref 8.9–10.3)
Chloride: 87 mmol/L — ABNORMAL LOW (ref 98–111)
Creatinine, Ser: 0.73 mg/dL (ref 0.61–1.24)
GFR calc Af Amer: >60 mL/min (ref >60)
GFR calc non Af Amer: >60 mL/min (ref >60)
Glucose, Bld: 285 mg/dL — ABNORMAL HIGH (ref 70–99)
Potassium: 3 mmol/L — ABNORMAL LOW (ref 3.5–5.1)
Sodium: 134 mmol/L — ABNORMAL LOW (ref 135–145)

## 2020-06-08 LAB — COMPREHENSIVE METABOLIC PANEL
ALT: 18 U/L (ref 0–44)
AST: 17 U/L (ref 15–41)
Albumin: 2.4 g/dL — ABNORMAL LOW (ref 3.5–5.0)
Alkaline Phosphatase: 41 U/L (ref 38–126)
Anion gap: 9 (ref 5–15)
BUN: 9 mg/dL (ref 6–20)
CO2: 33 mmol/L — ABNORMAL HIGH (ref 22–32)
Calcium: 6.6 mg/dL — ABNORMAL LOW (ref 8.9–10.3)
Chloride: 94 mmol/L — ABNORMAL LOW (ref 98–111)
Creatinine, Ser: 0.56 mg/dL — ABNORMAL LOW (ref 0.61–1.24)
GFR calc Af Amer: >60 mL/min (ref >60)
GFR calc non Af Amer: >60 mL/min (ref >60)
Glucose, Bld: 167 mg/dL — ABNORMAL HIGH (ref 70–99)
Potassium: 2.7 mmol/L — CL (ref 3.5–5.1)
Sodium: 136 mmol/L (ref 135–145)
Total Bilirubin: 0.6 mg/dL (ref 0.3–1.2)
Total Protein: 5.3 g/dL — ABNORMAL LOW (ref 6.5–8.1)

## 2020-06-08 LAB — GLUCOSE, CAPILLARY
Glucose-Capillary: 181 mg/dL — ABNORMAL HIGH (ref 70–99)
Glucose-Capillary: 174 mg/dL — ABNORMAL HIGH (ref 70–99)
Glucose-Capillary: 253 mg/dL — ABNORMAL HIGH (ref 70–99)
Glucose-Capillary: 170 mg/dL — ABNORMAL HIGH (ref 70–99)

## 2020-06-08 LAB — MAGNESIUM
Magnesium: 1.1 mg/dL — ABNORMAL LOW (ref 1.7–2.4)
Magnesium: 1.8 mg/dL (ref 1.7–2.4)

## 2020-06-08 LAB — AFP TUMOR MARKER: AFP, Serum, Tumor Marker: 1.7 ng/mL (ref 0.0–8.3)

## 2020-06-08 LAB — PROTEIN, BODY FLUID (OTHER): Total Protein, Body Fluid Other: 0.6 g/dL

## 2020-06-08 LAB — PROTIME-INR
INR: 1.5 — ABNORMAL HIGH (ref 0.8–1.2)
Prothrombin Time: 17.8 seconds — ABNORMAL HIGH (ref 11.4–15.2)

## 2020-06-08 LAB — PHOSPHORUS: Phosphorus: 3.9 mg/dL (ref 2.5–4.6)

## 2020-06-08 MED ORDER — POTASSIUM CHLORIDE 10 MEQ/50ML IV SOLN
10.0000 meq | INTRAVENOUS | Status: AC
  Administered 2020-06-08 (×4): 10 meq via INTRAVENOUS
  Filled 2020-06-08 (×4): qty 50

## 2020-06-08 MED ORDER — POTASSIUM CHLORIDE CRYS ER 20 MEQ PO TBCR
40.0000 meq | EXTENDED_RELEASE_TABLET | Freq: Once | ORAL | Status: AC
  Administered 2020-06-08: 40 meq via ORAL
  Filled 2020-06-08: qty 2

## 2020-06-08 MED ORDER — CALCIUM GLUCONATE-NACL 2-0.675 GM/100ML-% IV SOLN
2.0000 g | Freq: Once | INTRAVENOUS | Status: AC
  Administered 2020-06-08: 2000 mg via INTRAVENOUS
  Filled 2020-06-08: qty 100

## 2020-06-08 MED ORDER — MAGNESIUM SULFATE 2 GM/50ML IV SOLN
2.0000 g | Freq: Once | INTRAVENOUS | Status: AC
  Administered 2020-06-08: 2 g via INTRAVENOUS
  Filled 2020-06-08: qty 50

## 2020-06-08 MED ORDER — MAGNESIUM SULFATE 4 GM/100ML IV SOLN
4.0000 g | Freq: Once | INTRAVENOUS | Status: AC
  Administered 2020-06-08: 4 g via INTRAVENOUS
  Filled 2020-06-08: qty 100

## 2020-06-08 MED ORDER — POTASSIUM CHLORIDE CRYS ER 20 MEQ PO TBCR
40.0000 meq | EXTENDED_RELEASE_TABLET | ORAL | Status: AC
  Administered 2020-06-08 (×2): 40 meq via ORAL
  Filled 2020-06-08 (×2): qty 2

## 2020-06-08 NOTE — Progress Notes (Signed)
[facility]  ROUNDING NOTE   Subjective:   UOP 1200 Furosemide 8mg /hr Spironolactone 100mg  PO  IV albumin   Objective:  Vital signs in last 24 hours:  Temp:  [97.9 F (36.6 C)-98.4 F (36.9 C)] 98.4 F (36.9 C) (06/11 0800) Pulse Rate:  [81-109] 84 (06/11 0800) Resp:  [14-29] 21 (06/11 0500) BP: (107-128)/(49-74) 111/61 (06/11 0800) SpO2:  [89 %-99 %] 89 % (06/11 0800) FiO2 (%):  [65 %-76 %] 65 % (06/11 0800)  Weight change:  Filed Weights   06/05/20 0938 06/05/20 1400 06/07/20 0301  Weight: 98.4 kg 103.3 kg 101.3 kg    Intake/Output: I/O last 3 completed shifts: In: 1772.4 [P.O.:607; I.V.:501.3; IV Piggyback:664] Out: 1200 [Urine:1200]   Intake/Output this shift:  No intake/output data recorded.  Physical Exam: General: NAD,   Head: Normocephalic, atraumatic. Moist oral mucosal membranes  Eyes: Anicteric  Neck: Supple, trachea midline  Lungs:  Bilateral crackles at bases  Heart: Regular rate and rhythm  Abdomen:  Soft, nontender, +abdominal wall edema  Extremities:  ++ peripheral edema.  Neurologic: Nonfocal, moving all four extremities  Skin: No lesions         Basic Metabolic Panel: Recent Labs  Lab 06/05/20 0933 06/05/20 0933 06/06/20 0106 06/07/20 0814 06/08/20 0129  NA 129*  --  130* 133* 136  K 4.0  --  3.5 3.9 2.7*  CL 97*  --  97* 97* 94*  CO2 25  --  29 30 33*  GLUCOSE 183*  --  145* 142* 167*  BUN 9  --  10 8 9   CREATININE 0.83  --  0.68 0.54* 0.56*  CALCIUM 6.8*   < > 6.6* 7.0* 6.6*  MG 1.7  --   --   --  1.1*  PHOS 2.4*  --   --   --  3.9   < > = values in this interval not displayed.    Liver Function Tests: Recent Labs  Lab 06/05/20 0933 06/07/20 0814 06/08/20 0129  AST 25 22 17   ALT 22 20 18   ALKPHOS 59 50 41  BILITOT 1.0 0.9 0.6  PROT 5.7* 5.2* 5.3*  ALBUMIN 1.7*  1.7* 1.7* 2.4*   No results for input(s): LIPASE, AMYLASE in the last 168 hours. No results for input(s): AMMONIA in the last 168  hours.  CBC: Recent Labs  Lab 06/05/20 0933 06/06/20 0106 06/07/20 0814 06/08/20 0129  WBC 31.9* 19.1* 14.6* 12.7*  NEUTROABS 29.2*  --  12.1*  --   HGB 10.9* 9.4* 10.0* 8.8*  HCT 34.5* 29.8* 31.9* 27.2*  MCV 93.2 94.6 94.4 91.0  PLT 452* 340 366 302    Cardiac Enzymes: No results for input(s): CKTOTAL, CKMB, CKMBINDEX, TROPONINI in the last 168 hours.  BNP: Invalid input(s): POCBNP  CBG: Recent Labs  Lab 06/07/20 1120 06/07/20 1641 06/07/20 1911 06/07/20 2202 06/08/20 0746  GLUCAP 228* 321* 257* 205* 181*    Microbiology: Results for orders placed or performed during the hospital encounter of 06/05/20  SARS Coronavirus 2 by RT PCR (hospital order, performed in [facility] hospital lab) Nasopharyngeal Nasopharyngeal Swab     Status: None   Collection Time: 06/05/20  9:33 AM   Specimen: Nasopharyngeal Swab  Result Value Ref Range Status   SARS Coronavirus 2 NEGATIVE NEGATIVE Final    Comment: (NOTE) SARS-CoV-2 target nucleic acids are NOT DETECTED. The SARS-CoV-2 RNA is generally detectable in upper and lower respiratory specimens during the acute phase of infection. The lowest  concentration of SARS-CoV-2 viral copies this assay can detect is 250 copies / mL. A negative result does not preclude SARS-CoV-2 infection and should not be used as the sole basis for treatment or other patient management decisions.  A negative result may occur with improper specimen collection / handling, submission of specimen other than nasopharyngeal swab, presence of viral mutation(s) within the areas targeted by this assay, and inadequate number of viral copies (<250 copies / mL). A negative result must be combined with clinical observations, patient history, and epidemiological information. Fact Sheet for Patients:   StrictlyIdeas.no Fact Sheet for Healthcare Providers: BankingDealers.co.za This test is not yet approved or cleared   by the [place] FDA and has been authorized for detection and/or diagnosis of SARS-CoV-2 by FDA under an Emergency Use Authorization (EUA).  This EUA will remain in effect (meaning this test can be used) for the duration of the COVID-19 declaration under Section 564(b)(1) of the Act, 21 U.S.C. section 360bbb-3(b)(1), unless the authorization is terminated or revoked sooner. Performed at [facility], [address] [place], [place] [address]   Blood culture (routine x 2)     Status: None (Preliminary result)   Collection Time: 06/05/20 10:26 AM   Specimen: BLOOD  Result Value Ref Range Status   Specimen Description   Final    BLOOD Blood Culture results may not be optimal due to an excessive volume of blood received in culture bottles   Special Requests   Final    BOTTLES DRAWN AEROBIC AND ANAEROBIC LEFT ANTECUBITAL   Culture   Final    NO GROWTH 3 DAYS Performed at [facility], [address], [place], [place] [address]    Report Status PENDING  Incomplete  Blood culture (routine x 2)     Status: None (Preliminary result)   Collection Time: 06/05/20 10:26 AM   Specimen: BLOOD  Result Value Ref Range Status   Specimen Description   Final    BLOOD Blood Culture results may not be optimal due to an excessive volume of blood received in culture bottles   Special Requests   Final    BOTTLES DRAWN AEROBIC AND ANAEROBIC RIGHT ANTECUBITAL   Culture   Final    NO GROWTH 3 DAYS Performed at [facility], [address] [place], [place] [address]    Report Status PENDING  Incomplete  MRSA PCR Screening     Status: None   Collection Time: 06/05/20  2:42 PM   Specimen: Nasopharyngeal  Result Value Ref Range Status   MRSA by PCR NEGATIVE NEGATIVE Final    Comment:        The GeneXpert MRSA Assay (FDA approved for NASAL specimens only), is one component of a comprehensive MRSA colonization surveillance program. It is not intended to diagnose  MRSA infection nor to guide or monitor treatment for MRSA infections. Performed at [facility], [address] [place], [place] [address]     Coagulation Studies: Recent Labs    06/06/20 1821 06/08/20 0129  LABPROT 16.7* 17.8*  INR 1.4* 1.5*    Urinalysis: No results for input(s): COLORURINE, LABSPEC, PHURINE, GLUCOSEU, HGBUR, BILIRUBINUR, KETONESUR, PROTEINUR, UROBILINOGEN, NITRITE, LEUKOCYTESUR in the last 72 hours.  Invalid input(s): APPERANCEUR    Imaging: US Paracentesis  Result Date: 06/06/2020 INDICATION: 59 year old with ascites. EXAM: ULTRASOUND GUIDED PARACENTESIS MEDICATIONS: None. COMPLICATIONS: None immediate. PROCEDURE: Informed written consent was obtained from the patient after a discussion of the risks, benefits and alternatives to treatment.  A timeout was performed prior to the initiation of the procedure. Initial ultrasound scanning demonstrates a large amount of ascites within the right lower abdominal quadrant. The right lower abdomen was prepped and draped in the usual sterile fashion. 1% lidocaine was used for local anesthesia. Following this, a 6 Fr Safe-T-Centesis catheter was introduced. An ultrasound image was saved for documentation purposes. The paracentesis was performed. The catheter was removed and a dressing was applied. The patient tolerated the procedure well without immediate post procedural complication. Patient received post-procedure intravenous albumin; see nursing notes for details. FINDINGS: A total of approximately 2.5 L of yellow fluid was removed. Samples were sent to the laboratory as requested by the clinical team. IMPRESSION: Successful ultrasound-guided paracentesis yielding 2.5 liters of peritoneal fluid. Electronically Signed   By: Markus Daft M.D.   On: 06/06/2020 15:55   [place] EKG SITE RITE  Result Date: 06/07/2020 If Site Rite image not attached, placement could not be confirmed due to current cardiac  rhythm.    Medications:   . sodium chloride Stopped (06/07/20 1809)  . albumin human 60 mL/hr at 06/08/20 0600  . cefTRIAXone (ROCEPHIN)  IV 2 g (06/08/20 0952)  . furosemide (LASIX) infusion 8 mg/hr (06/08/20 0600)  . magnesium sulfate bolus IVPB    . octreotide  (SANDOSTATIN)    IV infusion 50 mcg/hr (06/08/20 0600)   . aspirin EC  81 mg Oral Daily  . azithromycin  250 mg Oral Daily  . budesonide (PULMICORT) nebulizer solution  0.5 mg Nebulization BID  . Chlorhexidine Gluconate Cloth  6 each Topical Daily  . ferrous sulfate  325 mg Oral BID WC  . folic acid  1 mg Oral Daily  . insulin aspart  0-15 Units Subcutaneous TID WC  . insulin aspart  0-5 Units Subcutaneous QHS  . ipratropium-albuterol  3 mL Nebulization Q6H  . LORazepam  0-4 mg Intravenous Q12H  . methylPREDNISolone (SOLU-MEDROL) injection  40 mg Intravenous Q12H  . multivitamin with minerals  1 tablet Oral Daily  . pantoprazole (PROTONIX) IV  40 mg Intravenous Q12H  . pneumococcal 23 valent vaccine  0.5 mL Intramuscular Tomorrow-1000  . potassium chloride  40 mEq Oral Once  . sodium chloride flush  10-40 mL Intracatheter Q12H  . spironolactone  100 mg Oral Daily  . thiamine injection  100 mg Intravenous Daily   And  . thiamine  100 mg Oral Daily   sodium chloride, acetaminophen **OR** acetaminophen, albuterol, dextromethorphan-guaiFENesin, iohexol, LORazepam, LORazepam **OR** LORazepam, naphazoline-glycerin, nitroGLYCERIN, ondansetron **OR** ondansetron (ZOFRAN) IV, sodium chloride flush  Assessment/ Plan:  Mr. Collin Byrd is a 59 y.o. Hispanic male with alcohol abuse, hypertension, diabetes mellitus type II, anemia, tobacco use, who was admitted to [facility] on 06/05/2020 for Shortness of breath [R06.02] Acute respiratory failure with hypoxia ([place]) [J96.01] Community acquired pneumonia, unspecified laterality [J18.9]  1. Anasarca: secondary to hepatic cirrhosis. Albumin improved to 2.4 Echocardiogram with preserved  systolic function.  Renal function within normal limits.  Status post paracentesis 6/9 with 2.5 liters removed.  - continue furosemide gtt - IV albumin 25mg  q8   - spironolactone  2. 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 On CIWA protocol. Currently on PO thiamine and folate.   3. Anemia: normocytic. With GI bleed.   4. Peritonitis: spontaneous bacterial.  - empiric antibiotics: ceftriaxone.   5. Hypokalemia - IV replacement.   6. Hyponatremia: improved with diuresis.    LOS: 3 Anasia Agro 6/11/202110:07 AM

## 2020-06-08 NOTE — Progress Notes (Addendum)
SYNOPSIS [name] with acute hypoxia with acute interstitial infiltrates with abd distention and lower ext edema, findings are concerning for progressive liver failure    CC  Follow up hypoxia   HPI Dx of liver cirrhosis Poor prognosis On bipap for hypoxia Alert and awake    EVENTS 6/8 admitted for hypoxia 6/9 Dx of liver cirrhosis and ascites, s/p paracentesis Albumin and lasix therapy 6/10 placed on biPAP, alert and awake 6/11 pending paracentesis today   VITALS: BP 111/61 (BP Location: Left Arm)   Pulse 84   Temp 98.4 F (36.9 C) (Oral)   Resp (!) 21   Ht 5\' 9"  (1.753 m)   Wt 101.3 kg   SpO2 (!) 89%   BMI 32.98 kg/m     I/O last 3 completed shifts: In: 1772.4 [P.O.:607; I.V.:501.3; IV Piggyback:664] Out: 1200 [Urine:1200] No intake/output data recorded.  SpO2: (!) 89 % O2 Flow Rate (L/min): 50 L/min FiO2 (%): 65 %  Estimated body mass index is 32.98 kg/m as calculated from the following:   Height as of this encounter: 5\' 9"  (1.753 m).   Weight as of this encounter: 101.3 kg.      Physical Examination:   General Appearance: +distress  Neuro:without focal findings,  speech normal,  HEENT: PERRLA, EOM intact.   Pulmonary: normal breath sounds, No wheezing.  CardiovascularNormal S1,S2.  No m/r/g.   Abdomen:+distended Renal:  No costovertebral tenderness  GU:  Not performed at this time. Endoc: No evident thyromegaly Skin:   warm, no rashes, no ecchymosis  Extremities: normal, no cyanosis, clubbing. PSYCHIATRIC: Mood, affect within normal limits.     I personally reviewed Labs under Results section.   MEDICATIONS: I have reviewed all medications and confirmed regimen as documented   CULTURE RESULTS   Recent Results (from the past 240 hour(s))  SARS Coronavirus 2 by RT PCR (hospital order, performed in [facility] hospital lab) Nasopharyngeal Nasopharyngeal Swab     Status: None   Collection Time: 06/05/20  9:33 AM   Specimen:  Nasopharyngeal Swab  Result Value Ref Range Status   SARS Coronavirus 2 NEGATIVE NEGATIVE Final    Comment: (NOTE) SARS-CoV-2 target nucleic acids are NOT DETECTED. The SARS-CoV-2 RNA is generally detectable in upper and lower respiratory specimens during the acute phase of infection. The lowest concentration of SARS-CoV-2 viral copies this assay can detect is 250 copies / mL. A negative result does not preclude SARS-CoV-2 infection and should not be used as the sole basis for treatment or other patient management decisions.  A negative result may occur with improper specimen collection / handling, submission of specimen other than nasopharyngeal swab, presence of viral mutation(s) within the areas targeted by this assay, and inadequate number of viral copies (<250 copies / mL). A negative result must be combined with clinical observations, patient history, and epidemiological information. Fact Sheet for Patients:   StrictlyIdeas.no Fact Sheet for Healthcare Providers: BankingDealers.co.za This test is not yet approved or cleared  by the [place] FDA and has been authorized for detection and/or diagnosis of SARS-CoV-2 by FDA under an Emergency Use Authorization (EUA).  This EUA will remain in effect (meaning this test can be used) for the duration of the COVID-19 declaration under Section 564(b)(1) of the Act, 21 U.S.C. section 360bbb-3(b)(1), unless the authorization is terminated or revoked sooner. Performed at [facility], [address] [place], [place] [address]   Blood culture (routine x 2)     Status: None (Preliminary  result)   Collection Time: 06/05/20 10:26 AM   Specimen: BLOOD  Result Value Ref Range Status   Specimen Description   Final    BLOOD Blood Culture results may not be optimal due to an excessive volume of blood received in culture bottles   Special Requests   Final    BOTTLES DRAWN AEROBIC AND  ANAEROBIC LEFT ANTECUBITAL   Culture   Final    NO GROWTH 3 DAYS Performed at [facility], [address], [place], [place] [address]    Report Status PENDING  Incomplete  Blood culture (routine x 2)     Status: None (Preliminary result)   Collection Time: 06/05/20 10:26 AM   Specimen: BLOOD  Result Value Ref Range Status   Specimen Description   Final    BLOOD Blood Culture results may not be optimal due to an excessive volume of blood received in culture bottles   Special Requests   Final    BOTTLES DRAWN AEROBIC AND ANAEROBIC RIGHT ANTECUBITAL   Culture   Final    NO GROWTH 3 DAYS Performed at [facility], [address] [place], [place] [address]    Report Status PENDING  Incomplete  MRSA PCR Screening     Status: None   Collection Time: 06/05/20  2:42 PM   Specimen: Nasopharyngeal  Result Value Ref Range Status   MRSA by PCR NEGATIVE NEGATIVE Final    Comment:        The GeneXpert MRSA Assay (FDA approved for NASAL specimens only), is one component of a comprehensive MRSA colonization surveillance program. It is not intended to diagnose MRSA infection nor to guide or monitor treatment for MRSA infections. Performed at [facility], [address], [place], [place] [address]           IMAGING    No results found. BMP Latest Ref Rng & Units 06/08/2020 06/07/2020 06/06/2020  Glucose 70 - 99 mg/dL 167(H) 142(H) 145(H)  BUN 6 - 20 mg/dL 9 8 10   Creatinine 0.61 - 1.24 mg/dL 0.56(L) 0.54(L) 0.68  Sodium 135 - 145 mmol/L 136 133(L) 130(L)  Potassium 3.5 - 5.1 mmol/L 2.7(LL) 3.9 3.5  Chloride 98 - 111 mmol/L 94(L) 97(L) 97(L)  CO2 22 - 32 mmol/L 33(H) 30 29  Calcium 8.9 - 10.3 mg/dL 6.6(L) 7.0(L) 6.6(L)         ASSESSMENT AND PLAN SYNOPSIS [name] with acute hypoxia with acute interstitial infiltrates with abd distention and lower ext edema, findings are concerning for progressive liver failure with possible systolic  heart failure  Severe ACUTE Hypoxic and Hypercapnic Respiratory Failure from pneumonia -Wean off biPAP to prn/qHS -Oxygen as needed -Continue IV abx -Will maintain IV steroids -Continue BD therapy  SEVERE COPD EXACERBATION -rhonchi with secretion clearance issues -continue IV steroids  -continue NEB THERAPY  -NIV as tolerated and qHS -morphine as needed for air hunger or tachypnea, watch for CO2 retention -wean fio2 as needed and tolerated  LIVER CIRRHOSIS +GIB -GI consultation -Several maroon to red stools over night -HgB delta over a gram last night -Coags mildly out, INR 1.5, FFP not likely to do much -ASA stopped, platelets are numerically adequate  -Maintain PPI and OCTREOTIDE -Nephrology assisting with volume status  -Albumin and lasix administration -Paracentesis planned for today   OBTAIN PICC LINE  ELECTROLYTES -follow labs as needed -replace as needed, particularly Mg/K -spironolactone added to reduce K loss -pharmacy consultation and following   DVT/GI PRX ordered  and assessed TRANSFUSIONS AS NEEDED MONITOR FSBS I Assessed the need for Labs I Assessed the need for Foley I Assessed the need for Central Venous Line Family Discussion when available I Assessed the need for Mobilization I made an Assessment of medications to be adjusted accordingly Safety Risk assessment completed  Prognosis is guarded  Critical care time 35 minutes    //Keyia Moretto

## 2020-06-08 NOTE — Progress Notes (Signed)
Pt with increased o2 requirement throughout shift. Pt up to 100% fio2 with 60L of flow via HFNC. Pts o2 sats remained in low 80's with RR in 40's. MD and RT notified and BiPAP placed. O2 sats increased and RR decreased. Pts o2 sats in 90's at this time.

## 2020-06-08 NOTE — Progress Notes (Signed)
GI Inpatient Follow-up Note  Subjective:  Patient seen in follow-up for SBP, ascites, and abdominal distention. No acute events overnight. He has had no further episodes of overt hematochezia or melena. No BMs today. He has required BiPAP for hypoxia. Patient had [place] today showing small volume of ascites with largest pocket of ascites around the liver. It appears paracentesis was not ordered. WBC trending down and is 12.7 today from 14.6 yesterday. Hemoglobin 8.8 this morning from 10.0 yesterday.   Scheduled Inpatient Medications:  . aspirin EC  81 mg Oral Daily  . azithromycin  250 mg Oral Daily  . budesonide (PULMICORT) nebulizer solution  0.5 mg Nebulization BID  . Chlorhexidine Gluconate Cloth  6 each Topical Daily  . ferrous sulfate  325 mg Oral BID WC  . folic acid  1 mg Oral Daily  . insulin aspart  0-15 Units Subcutaneous TID WC  . insulin aspart  0-5 Units Subcutaneous QHS  . ipratropium-albuterol  3 mL Nebulization Q6H  . LORazepam  0-4 mg Intravenous Q12H  . methylPREDNISolone (SOLU-MEDROL) injection  40 mg Intravenous Q12H  . multivitamin with minerals  1 tablet Oral Daily  . pantoprazole (PROTONIX) IV  40 mg Intravenous Q12H  . pneumococcal 23 valent vaccine  0.5 mL Intramuscular Tomorrow-1000  . potassium chloride  40 mEq Oral Q4H  . sodium chloride flush  10-40 mL Intracatheter Q12H  . spironolactone  100 mg Oral Daily  . thiamine injection  100 mg Intravenous Daily   And  . thiamine  100 mg Oral Daily    Continuous Inpatient Infusions:   . sodium chloride Stopped (06/07/20 1809)  . albumin human 25 g (06/08/20 1414)  . cefTRIAXone (ROCEPHIN)  IV 2 g (06/08/20 0952)  . furosemide (LASIX) infusion 8 mg/hr (06/08/20 0600)  . octreotide  (SANDOSTATIN)    IV infusion 50 mcg/hr (06/08/20 1415)    PRN Inpatient Medications:  sodium chloride, acetaminophen **OR** acetaminophen, albuterol, dextromethorphan-guaiFENesin, iohexol, LORazepam, LORazepam **OR** LORazepam,  naphazoline-glycerin, nitroGLYCERIN, ondansetron **OR** ondansetron (ZOFRAN) IV, sodium chloride flush  Review of Systems: Constitutional: Weight is stable.  Eyes: No changes in vision. ENT: No oral lesions, sore throat.  GI: see HPI.  Heme/Lymph: No easy bruising.  CV: No chest pain.  GU: No hematuria.  Integumentary: No rashes.  Neuro: No headaches.  Psych: No depression/anxiety.  Endocrine: No heat/cold intolerance.  Allergic/Immunologic: No urticaria.  Resp: No cough, SOB.  Musculoskeletal: No joint swelling.    Physical Examination: BP 111/61 (BP Location: Left Arm)   Pulse 84   Temp 98.4 F (36.9 C) (Oral)   Resp (!) 21   Ht 5\' 9"  (1.753 m)   Wt 101.3 kg   SpO2 (!) 89%   BMI 32.98 kg/m   Ill-appearing Hispanic male in hospital bed.  Gen: NAD, alert and oriented x 4 HEENT: PEERLA, EOMI, Neck: supple, no JVD or thyromegaly Chest: Coarse breath sounds bilaterally. Rhonchi present.  CV: RRR, no m/g/c/r Abd: distended with large fluid wave, tenderness to deep palpation in RUQ and epigastric region, positive bowel sounds, no HSM, guarding, ridigity, or rebound tenderness Ext: 2+ edema  Skin: no rash or lesions noted Lymph: no LAD  Data: Lab Results  Component Value Date   WBC 12.7 (H) 06/08/2020   HGB 8.8 (L) 06/08/2020   HCT 27.2 (L) 06/08/2020   MCV 91.0 06/08/2020   PLT 302 06/08/2020   Recent Labs  Lab 06/06/20 0106 06/07/20 0814 06/08/20 0129  HGB 9.4* 10.0* 8.8*  Lab Results  Component Value Date   NA 134 (L) 06/08/2020   K 3.0 (L) 06/08/2020   CL 87 (L) 06/08/2020   CO2 35 (H) 06/08/2020   BUN 9 06/08/2020   CREATININE 0.73 06/08/2020   Lab Results  Component Value Date   ALT 18 06/08/2020   AST 17 06/08/2020   ALKPHOS 41 06/08/2020   BILITOT 0.6 06/08/2020   Recent Labs  Lab 06/08/20 0129  INR 1.5*   Assessment/Plan:  59 y/o Hispanic male with a PMH of alcohol abuse (8-12 beers/daily) admitted for progressive shortness of breath,  chest pain, abdominal distention, and bilateral lower extremity edema  1. Acute respiratory failure with hypoxia - requiring BiPAP to meet demands  2. Sepsis - bilateral lower leg cellulitis and bilateral pneumonia and new diagnosis of SBP  3. Decompensated cirrhosis with abdominal ascites - new diagnosis found during this admission, likely 2/2 alcohol.   4. Spontaneous Bacterial Peritonitis - PMN 412  5. GI bleeding - reported maroon stools yesterday, none today. Hemoglobin 8.8 with no overt gastrointestinal bleeding today On PPI and Octreotide.   Recommendations:  1. Repeat paracentesis to ensure cell count is improving 2. Continue IV Ceftriaxone 2g IV q24h. Continue IV Octreotide.  3. No indication for emergent endoscopic procedures as hemoglobin is stable and no signs of overt gastrointestinal bleeding. Continue to monitor H&H.  4. Continue diuresis 5. Continue IV Albumin 25 mg q8h per nephrology 6. Dr. Bonna Gains will take over patient's care tomorrow   Please call with questions or concerns.    Octavia Bruckner, PA-C [facility] [phone] (Cell)

## 2020-06-08 NOTE — Consult Note (Signed)
PHARMACY CONSULT NOTE - FOLLOW UP  Pharmacy Consult for Electrolyte Monitoring and Replacement   Recent Labs: Potassium (mmol/L)  Date Value  06/08/2020 3.0 (L)   Magnesium (mg/dL)  Date Value  06/08/2020 1.8   Calcium (mg/dL)  Date Value  06/08/2020 7.0 (L)   Albumin (g/dL)  Date Value  06/08/2020 2.4 (L)   Phosphorus (mg/dL)  Date Value  06/08/2020 3.9   Sodium (mmol/L)  Date Value  06/08/2020 134 (L)     Assessment: Patient is a 59 y/o M with medical history including EtOH abuse who is admitted to the ICU with acute respiratory failure, anasarca, hypovolemic hyponatremia. Now with new diagnosis of liver cirrhosis. He is s/p paracentesis on 6/9 and 2.5L was removed. He is being treated with antibiotics for SBP, lower leg cellulitis, and pneumonia.   Started on Lasix drip + IV albumin today for fluid overload. Also on spironolactone 100 mg daily.  Goal of Therapy:  Electrolytes within normal limits  Plan:  Potassium and magnesium low this morning. Patient continues on Lasix drip at 8 mg/hr and spironolactone 100 mg. Patient has received mag 6 g IV total and potassium 40 mEq PO + 10 mEq IV x 4. Repeat potassium at 1500 is 3. Will give additional potassium 40 mEq PO x 2. Will repeat all electrolytes with morning labs.  Tawnya Crook, PharmD Clinical Pharmacist 06/08/2020 4:06 PM

## 2020-06-09 LAB — CBC
HCT: 27.3 % — ABNORMAL LOW (ref 39.0–52.0)
Hemoglobin: 8.6 g/dL — ABNORMAL LOW (ref 13.0–17.0)
MCH: 29 pg (ref 26.0–34.0)
MCHC: 31.5 g/dL (ref 30.0–36.0)
MCV: 91.9 fL (ref 80.0–100.0)
Platelets: 331 10*3/uL (ref 150–400)
RBC: 2.97 MIL/uL — ABNORMAL LOW (ref 4.22–5.81)
RDW: 14.5 % (ref 11.5–15.5)
WBC: 11 10*3/uL — ABNORMAL HIGH (ref 4.0–10.5)
nRBC: 0 % (ref 0.0–0.2)

## 2020-06-09 LAB — GLUCOSE, CAPILLARY
Glucose-Capillary: 209 mg/dL — ABNORMAL HIGH (ref 70–99)
Glucose-Capillary: 348 mg/dL — ABNORMAL HIGH (ref 70–99)
Glucose-Capillary: 367 mg/dL — ABNORMAL HIGH (ref 70–99)
Glucose-Capillary: 180 mg/dL — ABNORMAL HIGH (ref 70–99)

## 2020-06-09 LAB — BASIC METABOLIC PANEL
Anion gap: 14 (ref 5–15)
BUN: 14 mg/dL (ref 6–20)
CO2: 36 mmol/L — ABNORMAL HIGH (ref 22–32)
Calcium: 7.1 mg/dL — ABNORMAL LOW (ref 8.9–10.3)
Chloride: 87 mmol/L — ABNORMAL LOW (ref 98–111)
Creatinine, Ser: 0.67 mg/dL (ref 0.61–1.24)
GFR calc Af Amer: >60 mL/min (ref >60)
GFR calc non Af Amer: >60 mL/min (ref >60)
Glucose, Bld: 307 mg/dL — ABNORMAL HIGH (ref 70–99)
Potassium: 3.5 mmol/L (ref 3.5–5.1)
Sodium: 137 mmol/L (ref 135–145)

## 2020-06-09 LAB — MAGNESIUM: Magnesium: 1.5 mg/dL — ABNORMAL LOW (ref 1.7–2.4)

## 2020-06-09 LAB — PHOSPHORUS: Phosphorus: 2.7 mg/dL (ref 2.5–4.6)

## 2020-06-09 MED ORDER — INSULIN GLARGINE 100 UNIT/ML ~~LOC~~ SOLN
10.0000 [IU] | Freq: Every day | SUBCUTANEOUS | Status: DC
  Administered 2020-06-09 – 2020-06-15 (×7): 10 [IU] via SUBCUTANEOUS
  Filled 2020-06-09 (×8): qty 0.1

## 2020-06-09 MED ORDER — MORPHINE SULFATE (PF) 2 MG/ML IV SOLN
1.0000 mg | INTRAVENOUS | Status: DC | PRN
  Administered 2020-06-09: 2 mg via INTRAVENOUS
  Filled 2020-06-09 (×2): qty 1

## 2020-06-09 MED ORDER — MORPHINE SULFATE (PF) 2 MG/ML IV SOLN
2.0000 mg | Freq: Once | INTRAVENOUS | Status: AC
  Administered 2020-06-09: 2 mg via INTRAVENOUS
  Filled 2020-06-09: qty 1

## 2020-06-09 MED ORDER — INSULIN ASPART 100 UNIT/ML ~~LOC~~ SOLN
0.0000 [IU] | Freq: Three times a day (TID) | SUBCUTANEOUS | Status: DC
  Administered 2020-06-09: 20 [IU] via SUBCUTANEOUS
  Administered 2020-06-10: 15 [IU] via SUBCUTANEOUS
  Administered 2020-06-10: 11 [IU] via SUBCUTANEOUS
  Administered 2020-06-10: 7 [IU] via SUBCUTANEOUS
  Administered 2020-06-11: 20 [IU] via SUBCUTANEOUS
  Administered 2020-06-12: 11 [IU] via SUBCUTANEOUS
  Administered 2020-06-12 (×2): 4 [IU] via SUBCUTANEOUS
  Administered 2020-06-13: 15 [IU] via SUBCUTANEOUS
  Administered 2020-06-13 (×2): 3 [IU] via SUBCUTANEOUS
  Administered 2020-06-14 (×2): 4 [IU] via SUBCUTANEOUS
  Administered 2020-06-15: 15 [IU] via SUBCUTANEOUS
  Administered 2020-06-15: 4 [IU] via SUBCUTANEOUS
  Administered 2020-06-15 – 2020-06-16 (×2): 7 [IU] via SUBCUTANEOUS
  Administered 2020-06-16: 20 [IU] via SUBCUTANEOUS
  Administered 2020-06-16: 4 [IU] via SUBCUTANEOUS
  Administered 2020-06-17: 3 [IU] via SUBCUTANEOUS
  Administered 2020-06-17: 20 [IU] via SUBCUTANEOUS
  Administered 2020-06-17: 11 [IU] via SUBCUTANEOUS
  Administered 2020-06-18: 7 [IU] via SUBCUTANEOUS
  Administered 2020-06-18: 11 [IU] via SUBCUTANEOUS
  Administered 2020-06-19: 15 [IU] via SUBCUTANEOUS
  Administered 2020-06-19: 10:00:00 7 [IU] via SUBCUTANEOUS
  Administered 2020-06-19: 15 [IU] via SUBCUTANEOUS
  Administered 2020-06-20 (×2): 7 [IU] via SUBCUTANEOUS
  Administered 2020-06-20: 11 [IU] via SUBCUTANEOUS
  Administered 2020-06-21: 13:00:00 7 [IU] via SUBCUTANEOUS
  Administered 2020-06-21: 10:00:00 3 [IU] via SUBCUTANEOUS
  Filled 2020-06-09 (×33): qty 1

## 2020-06-09 MED ORDER — INSULIN ASPART 100 UNIT/ML ~~LOC~~ SOLN: 0.0000 [IU] | Freq: Every day | SUBCUTANEOUS | Status: DC

## 2020-06-09 MED ORDER — MAGNESIUM SULFATE 4 GM/100ML IV SOLN
4.0000 g | Freq: Once | INTRAVENOUS | Status: AC
  Administered 2020-06-09: 4 g via INTRAVENOUS
  Filled 2020-06-09: qty 100

## 2020-06-09 NOTE — Progress Notes (Signed)
Patient vitals remain steady.  He has a significant cough d/t the pneumonia.  His family called today to inquire about patient status.  I explained the medical therapy given and explained the possible need for a ventilator should the patient decompensate.  Patient affirms that he understands the course of the doctors modalities in relation to his compromised breathing.  He is receiving BiPAP at night and today he maintained on 55L HFNC @ 94%FiO2.  I educated patient on the need to take small bites when eating meals to allow for recuperation with his saturations.  Patient was non-compliant earlier today when explaining the need for calling out for help before trying to get out of bed.  I made several examples of room hazards to look out for to avoid falling.  Patient continued to move about in his room without assistance. He had a bowel movement after ambulating to the toilet.  He consumed his meals today with little assistance.  I called the physician because his insulin coverage and sliding scale did not cover patient as each subsequent reading of his blood glucose level continued to increase into the 300's.  Physician also ordered lantis for longer term coverage.  Patient continues to receive lasix drip 8mg /hr and octreotide drip.

## 2020-06-09 NOTE — Progress Notes (Signed)
Vonda Antigua, MD [address] [place], [place], [place], [address] [address], [place], [place], [place], [address] Phone: [phone]  Fax: [phone]   Subjective: Pt denies any bleeding yesterday or today. No abdominal pain   Objective: Exam: Vital signs in last 24 hours: Vitals:   06/09/20 1300 06/09/20 1400 06/09/20 1500 06/09/20 1600  BP: 132/68 110/60 (!) 109/58 (!) 110/57  Pulse: 77 (!) 102 94 95  Resp: (!) 22 (!) 22 (!) 22 12  Temp:      TempSrc:      SpO2: (!) 89% (!) 85% (!) 87% (!) 88%  Weight:      Height:       Weight change:   Intake/Output Summary (Last 24 hours) at 06/09/2020 1635 Last data filed at 06/09/2020 1614 Gross per 24 hour  Intake 2445.6 ml  Output 6525 ml  Net -4079.4 ml    General: No acute distress, AAO x3 Abd: Soft, NT/ND, No HSM Skin: Warm, no rashes Neck: Supple, Trachea midline   Lab Results: Lab Results  Component Value Date   WBC 11.0 (H) 06/09/2020   HGB 8.6 (L) 06/09/2020   HCT 27.3 (L) 06/09/2020   MCV 91.9 06/09/2020   PLT 331 06/09/2020   Micro Results: Recent Results (from the past 240 hour(s))  SARS Coronavirus 2 by RT PCR (hospital order, performed in [place] hospital lab) Nasopharyngeal Nasopharyngeal Swab     Status: None   Collection Time: 06/05/20  9:33 AM   Specimen: Nasopharyngeal Swab  Result Value Ref Range Status   SARS Coronavirus 2 NEGATIVE NEGATIVE Final    Comment: (NOTE) SARS-CoV-2 target nucleic acids are NOT DETECTED. The SARS-CoV-2 RNA is generally detectable in upper and lower respiratory specimens during the acute phase of infection. The lowest concentration of SARS-CoV-2 viral copies this assay can detect is 250 copies / mL. A negative result does not preclude SARS-CoV-2 infection and should not be used as the sole basis for treatment or other patient management decisions.  A negative result may occur with improper specimen collection / handling, submission of specimen  other than nasopharyngeal swab, presence of viral mutation(s) within the areas targeted by this assay, and inadequate number of viral copies (<250 copies / mL). A negative result must be combined with clinical observations, patient history, and epidemiological information. Fact Sheet for Patients:   StrictlyIdeas.no Fact Sheet for Healthcare Providers: BankingDealers.co.za This test is not yet approved or cleared  by the [place] FDA and has been authorized for detection and/or diagnosis of SARS-CoV-2 by FDA under an Emergency Use Authorization (EUA).  This EUA will remain in effect (meaning this test can be used) for the duration of the COVID-19 declaration under Section 564(b)(1) of the Act, 21 U.S.C. section 360bbb-3(b)(1), unless the authorization is terminated or revoked sooner. Performed at [facility], [address] [place], [place] [address]   Blood culture (routine x 2)     Status: None (Preliminary result)   Collection Time: 06/05/20 10:26 AM   Specimen: BLOOD  Result Value Ref Range Status   Specimen Description   Final    BLOOD Blood Culture results may not be optimal due to an excessive volume of blood received in culture bottles   Special Requests   Final    BOTTLES DRAWN AEROBIC AND ANAEROBIC LEFT ANTECUBITAL   Culture   Final    NO GROWTH 4 DAYS Performed at [facility], [address] [place], [place] [address]  Report Status PENDING  Incomplete  Blood culture (routine x 2)     Status: None (Preliminary result)   Collection Time: 06/05/20 10:26 AM   Specimen: BLOOD  Result Value Ref Range Status   Specimen Description   Final    BLOOD Blood Culture results may not be optimal due to an excessive volume of blood received in culture bottles   Special Requests   Final    BOTTLES DRAWN AEROBIC AND ANAEROBIC RIGHT ANTECUBITAL   Culture   Final    NO GROWTH 4 DAYS Performed at [facility], [address] [place], [place] [address]    Report Status PENDING  Incomplete  MRSA PCR Screening     Status: None   Collection Time: 06/05/20  2:42 PM   Specimen: Nasopharyngeal  Result Value Ref Range Status   MRSA by PCR NEGATIVE NEGATIVE Final    Comment:        The GeneXpert MRSA Assay (FDA approved for NASAL specimens only), is one component of a comprehensive MRSA colonization surveillance program. It is not intended to diagnose MRSA infection nor to guide or monitor treatment for MRSA infections. Performed at [facility], [address] [place], [place] [address]    Studies/Results: [place] ASCITES (ABDOMEN LIMITED)  Result Date: 06/08/2020 CLINICAL DATA:  59 year old with cirrhosis and ascites. Recent paracentesis on 06/06/2020. EXAM: LIMITED ABDOMEN ULTRASOUND FOR ASCITES TECHNIQUE: Limited ultrasound survey for ascites was performed in all four abdominal quadrants. COMPARISON:  06/06/2020 FINDINGS: Nodular contour of the liver is compatible with cirrhosis. There is perihepatic ascites. Small amount of fluid in the right and left lower quadrants. Small amount of fluid in the left upper quadrant. IMPRESSION: Small volume of ascites. Largest pocket of ascites is around the liver. Liver has a nodular contour and compatible with cirrhosis. Electronically Signed   By: Markus Daft M.D.   On: 06/08/2020 11:50   Medications:  Scheduled Meds: . aspirin EC  81 mg Oral Daily  . azithromycin  250 mg Oral Daily  . budesonide (PULMICORT) nebulizer solution  0.5 mg Nebulization BID  . Chlorhexidine Gluconate Cloth  6 each Topical Daily  . ferrous sulfate  325 mg Oral BID WC  . folic acid  1 mg Oral Daily  . insulin aspart  0-15 Units Subcutaneous TID WC  . insulin aspart  0-5 Units Subcutaneous QHS  . ipratropium-albuterol  3 mL Nebulization Q6H  . LORazepam  0-4 mg Intravenous Q12H  . methylPREDNISolone (SOLU-MEDROL) injection  40 mg Intravenous Q12H  .  multivitamin with minerals  1 tablet Oral Daily  . pantoprazole (PROTONIX) IV  40 mg Intravenous Q12H  . pneumococcal 23 valent vaccine  0.5 mL Intramuscular Tomorrow-1000  . sodium chloride flush  10-40 mL Intracatheter Q12H  . spironolactone  100 mg Oral Daily  . thiamine injection  100 mg Intravenous Daily   And  . thiamine  100 mg Oral Daily   Continuous Infusions: . sodium chloride Stopped (06/07/20 1809)  . albumin human 25 g (06/09/20 1459)  . cefTRIAXone (ROCEPHIN)  IV 2 g (06/09/20 1047)  . furosemide (LASIX) infusion 8 mg/hr (06/09/20 1000)  . octreotide  (SANDOSTATIN)    IV infusion 50 mcg/hr (06/09/20 1316)   PRN Meds:.sodium chloride, acetaminophen **OR** acetaminophen, albuterol, dextromethorphan-guaiFENesin, iohexol, LORazepam, naphazoline-glycerin, nitroGLYCERIN, ondansetron **OR** ondansetron (ZOFRAN) IV, sodium chloride flush   Assessment: Principal Problem:   Acute respiratory failure with hypoxia (HCC) Active Problems:   Hyponatremia   Elevated troponin  Leukocytosis   Bilateral lower leg cellulitis   Chest pain   Sepsis ([place])   Alcohol abuse   Edema leg   SBP (spontaneous bacterial peritonitis) ([place])   Hepatic cirrhosis ([place])    Plan: Paracentesis pending Hemoglobin stable with no active bleeding  Continue antibiotics for SBP   LOS: 4 days   Vonda Antigua, MD 06/09/2020, 4:35 PM

## 2020-06-09 NOTE — Progress Notes (Signed)
Assisted patient to and from the bathroom on HFNC. Patient desatted to upper 70s and sustaining. Placed patient on Bipap at 60% with little improvement. Increased FIO2 on Bipap to 100% and patient increased his sats to mid and upper 70s. ICU NP called to bedside. Slid patient up in bed. RT called to bedside. Received new orders for a one time order for morphine IV. See MAR. Sats increased to the 90s on Bipap. Will continue to monitor.

## 2020-06-09 NOTE — Progress Notes (Signed)
Assisted patient to toilet and returned back to bed and replaced bed pad.

## 2020-06-09 NOTE — Progress Notes (Signed)
[place], [place] 06/09/20  Subjective:   Hospital day # 4   cvs:iv lasix drip; good UOP Pulm: requiring high flow nasal cannula. gi: Ate 100% of his breakfast this morning Renal: able to void good   Objective:  Vital signs in last 24 hours:  Temp:  [98 F (36.7 C)-98.7 F (37.1 C)] 98 F (36.7 C) (06/12 0800) Pulse Rate:  [62-97] 66 (06/12 0800) Resp:  [11-34] 23 (06/12 0800) BP: (95-152)/(58-86) 116/66 (06/12 0800) SpO2:  [84 %-98 %] 90 % (06/12 0816) FiO2 (%):  [60 %-100 %] 100 % (06/12 0816)  Weight change:  Filed Weights   06/05/20 0938 06/05/20 1400 06/07/20 0301  Weight: 98.4 kg 103.3 kg 101.3 kg    Intake/Output:    Intake/Output Summary (Last 24 hours) at 06/09/2020 0913 Last data filed at 06/09/2020 0841 Gross per 24 hour  Intake 2313.8 ml  Output 5525 ml  Net -3211.2 ml     Physical Exam: General:  Ill-appearing, laying in the bed  HEENT  anicteric, moist oral mucous membranes  Pulm/lungs  bilateral mild diffuse crackles, requiring high oxygen supplementation  CVS/Heart  regular, tachycardic  Abdomen:   Soft, mildly distended  Extremities:  2+ pitting edema bilaterally  Neurologic:  Alert, able to answer questions appropriate  Skin:  No acute rash  Access:        Basic Metabolic Panel:  Recent Labs  Lab 06/05/20 0933 06/05/20 0933 06/06/20 0106 06/06/20 0106 06/07/20 0814 06/07/20 0814 06/08/20 0129 06/08/20 1515 06/09/20 0440  NA 129*   < > 130*  --  133*  --  136 134* 137  K 4.0   < > 3.5  --  3.9  --  2.7* 3.0* 3.5  CL 97*   < > 97*  --  97*  --  94* 87* 87*  CO2 25   < > 29  --  30  --  33* 35* 36*  GLUCOSE 183*   < > 145*  --  142*  --  167* 285* 307*  BUN 9   < > 10  --  8  --  9 9 14   CREATININE 0.83   < > 0.68  --  0.54*  --  0.56* 0.73 0.67  CALCIUM 6.8*   < > 6.6*   < > 7.0*   < > 6.6* 7.0* 7.1*  MG 1.7  --   --   --   --   --  1.1* 1.8 1.5*  PHOS 2.4*  --   --   --   --   --  3.9  --  2.7    < > = values in this interval not displayed.     CBC: Recent Labs  Lab 06/05/20 0933 06/06/20 0106 06/07/20 0814 06/08/20 0129 06/09/20 0440  WBC 31.9* 19.1* 14.6* 12.7* 11.0*  NEUTROABS 29.2*  --  12.1*  --   --   HGB 10.9* 9.4* 10.0* 8.8* 8.6*  HCT 34.5* 29.8* 31.9* 27.2* 27.3*  MCV 93.2 94.6 94.4 91.0 91.9  PLT 452* 340 366 302 331      Lab Results  Component Value Date   HEPBSAG NON REACTIVE 06/05/2020   HEPBIGM NON REACTIVE 06/05/2020      Microbiology:  Recent Results (from the past 240 hour(s))  SARS Coronavirus 2 by RT PCR (hospital order, performed in [facility] hospital lab) Nasopharyngeal Nasopharyngeal Swab     Status: None   Collection Time: 06/05/20  9:33 AM   Specimen: Nasopharyngeal Swab  Result Value Ref Range Status   SARS Coronavirus 2 NEGATIVE NEGATIVE Final    Comment: (NOTE) SARS-CoV-2 target nucleic acids are NOT DETECTED. The SARS-CoV-2 RNA is generally detectable in upper and lower respiratory specimens during the acute phase of infection. The lowest concentration of SARS-CoV-2 viral copies this assay can detect is 250 copies / mL. A negative result does not preclude SARS-CoV-2 infection and should not be used as the sole basis for treatment or other patient management decisions.  A negative result may occur with improper specimen collection / handling, submission of specimen other than nasopharyngeal swab, presence of viral mutation(s) within the areas targeted by this assay, and inadequate number of viral copies (<250 copies / mL). A negative result must be combined with clinical observations, patient history, and epidemiological information. Fact Sheet for Patients:   StrictlyIdeas.no Fact Sheet for Healthcare Providers: BankingDealers.co.za This test is not yet approved or cleared  by the [place] FDA and has been authorized for detection and/or diagnosis of SARS-CoV-2 by FDA  under an Emergency Use Authorization (EUA).  This EUA will remain in effect (meaning this test can be used) for the duration of the COVID-19 declaration under Section 564(b)(1) of the Act, 21 U.S.C. section 360bbb-3(b)(1), unless the authorization is terminated or revoked sooner. Performed at [facility], [address] [place], [place] [address]   Blood culture (routine x 2)     Status: None (Preliminary result)   Collection Time: 06/05/20 10:26 AM   Specimen: BLOOD  Result Value Ref Range Status   Specimen Description   Final    BLOOD Blood Culture results may not be optimal due to an excessive volume of blood received in culture bottles   Special Requests   Final    BOTTLES DRAWN AEROBIC AND ANAEROBIC LEFT ANTECUBITAL   Culture   Final    NO GROWTH 4 DAYS Performed at [facility], [address], [place], [place] [address]    Report Status PENDING  Incomplete  Blood culture (routine x 2)     Status: None (Preliminary result)   Collection Time: 06/05/20 10:26 AM   Specimen: BLOOD  Result Value Ref Range Status   Specimen Description   Final    BLOOD Blood Culture results may not be optimal due to an excessive volume of blood received in culture bottles   Special Requests   Final    BOTTLES DRAWN AEROBIC AND ANAEROBIC RIGHT ANTECUBITAL   Culture   Final    NO GROWTH 4 DAYS Performed at [facility], [address], [place], [place] [address]    Report Status PENDING  Incomplete  MRSA PCR Screening     Status: None   Collection Time: 06/05/20  2:42 PM   Specimen: Nasopharyngeal  Result Value Ref Range Status   MRSA by PCR NEGATIVE NEGATIVE Final    Comment:        The GeneXpert MRSA Assay (FDA approved for NASAL specimens only), is one component of a comprehensive MRSA colonization surveillance program. It is not intended to diagnose MRSA infection nor to guide or monitor treatment for MRSA infections. Performed at [facility], [address] [place], [place] [address]     Coagulation Studies: Recent Labs    06/06/20 1821 06/08/20 0129  LABPROT 16.7* 17.8*  INR 1.4* 1.5*    Urinalysis: No results for input(s): COLORURINE, LABSPEC, PHURINE, GLUCOSEU, HGBUR, BILIRUBINUR, KETONESUR, PROTEINUR, UROBILINOGEN, NITRITE,  LEUKOCYTESUR in the last 72 hours.  Invalid input(s): APPERANCEUR    Imaging: [place] ASCITES (ABDOMEN LIMITED)  Result Date: 06/08/2020 CLINICAL DATA:  59 year old with cirrhosis and ascites. Recent paracentesis on 06/06/2020. EXAM: LIMITED ABDOMEN ULTRASOUND FOR ASCITES TECHNIQUE: Limited ultrasound survey for ascites was performed in all four abdominal quadrants. COMPARISON:  06/06/2020 FINDINGS: Nodular contour of the liver is compatible with cirrhosis. There is perihepatic ascites. Small amount of fluid in the right and left lower quadrants. Small amount of fluid in the left upper quadrant. IMPRESSION: Small volume of ascites. Largest pocket of ascites is around the liver. Liver has a nodular contour and compatible with cirrhosis. Electronically Signed   By: Markus Daft M.D.   On: 06/08/2020 11:50     Medications:   . sodium chloride Stopped (06/07/20 1809)  . albumin human Stopped (06/09/20 2831)  . cefTRIAXone (ROCEPHIN)  IV Stopped (06/08/20 1022)  . furosemide (LASIX) infusion 8 mg/hr (06/09/20 0800)  . magnesium sulfate bolus IVPB 4 g (06/09/20 0841)  . octreotide  (SANDOSTATIN)    IV infusion 50 mcg/hr (06/09/20 0800)   . aspirin EC  81 mg Oral Daily  . azithromycin  250 mg Oral Daily  . budesonide (PULMICORT) nebulizer solution  0.5 mg Nebulization BID  . Chlorhexidine Gluconate Cloth  6 each Topical Daily  . ferrous sulfate  325 mg Oral BID WC  . folic acid  1 mg Oral Daily  . insulin aspart  0-15 Units Subcutaneous TID WC  . insulin aspart  0-5 Units Subcutaneous QHS  . ipratropium-albuterol  3 mL Nebulization Q6H  . LORazepam  0-4 mg Intravenous Q12H  .  methylPREDNISolone (SOLU-MEDROL) injection  40 mg Intravenous Q12H  . multivitamin with minerals  1 tablet Oral Daily  . pantoprazole (PROTONIX) IV  40 mg Intravenous Q12H  . pneumococcal 23 valent vaccine  0.5 mL Intramuscular Tomorrow-1000  . sodium chloride flush  10-40 mL Intracatheter Q12H  . spironolactone  100 mg Oral Daily  . thiamine injection  100 mg Intravenous Daily   And  . thiamine  100 mg Oral Daily   sodium chloride, acetaminophen **OR** acetaminophen, albuterol, dextromethorphan-guaiFENesin, iohexol, LORazepam, naphazoline-glycerin, nitroGLYCERIN, ondansetron **OR** ondansetron (ZOFRAN) IV, sodium chloride flush  Assessment/ Plan:  59 y.o. male with  alcohol abuse, hypertension, diabetes mellitus type II, anemia, tobacco use, who was    admitted on 06/05/2020 for Shortness of breath [R06.02] Acute respiratory failure with hypoxia ([place]) [J96.01] Community acquired pneumonia, unspecified laterality [J18.9]  1. Anasarca: secondary to hepatic cirrhosis. Echocardiogram with preserved systolic function.  Renal function within normal limits.  Status post paracentesis 6/9 with 2.5 liters removed.  - continue furosemide gtt - IV albumin 25mg  q8   - spironolactone Good response 06/11 0701 - 06/12 0700 In: 2267.8 [P.O.:914; I.V.:822.5; IV Piggyback:531.3] Out: 4825 [Urine:4825]      Lab Results  Component Value Date   CREATININE 0.67 06/09/2020   CREATININE 0.73 06/08/2020   CREATININE 0.56 (L) 06/08/2020     2. 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 On CIWA protocol. Currently on PO thiamine and folate.  Hypoalbuminemia-IV albumin supplementation IV octreotide IV Solu-Medrol  3. Anemia: normocytic. With GI bleed.   4. Peritonitis: spontaneous bacterial.  - empiric antibiotics: ceftriaxone.  5. Acute respiratory failure: CT chest with IV contrast on June 9 - for pulmonary embolism.  Extensive bibasilar consolidation and diffuse bilateral  groundglass opacities noted concerning for multifocal pneumonia likely from aspiration. Also noted to have hilar and mediastinal adenopathy (  reactive) 2.8 cm mass in the right hilar region, likely enlarged lymph node. High O2 requirement and supplementation      LOS: 4 Collin Byrd 6/12/20219:13 AM  [place], [place]  Note: This note was prepared with Dragon dictation. Any transcription errors are unintentional

## 2020-06-09 NOTE — Consult Note (Signed)
PHARMACY CONSULT NOTE - FOLLOW UP  Pharmacy Consult for Electrolyte Monitoring and Replacement   Recent Labs: Potassium (mmol/L)  Date Value  06/09/2020 3.5   Magnesium (mg/dL)  Date Value  06/09/2020 1.5 (L)   Calcium (mg/dL)  Date Value  06/09/2020 7.1 (L)   Albumin (g/dL)  Date Value  06/08/2020 2.4 (L)   Phosphorus (mg/dL)  Date Value  06/09/2020 2.7   Sodium (mmol/L)  Date Value  06/09/2020 137   Corrected Ca: 8.38 mg/dL  Assessment: Patient is a 59 y/o M with medical history including EtOH abuse who is admitted to the ICU with acute respiratory failure, anasarca, hypovolemic hyponatremia. Now with new diagnosis of liver cirrhosis. He is s/p paracentesis on 6/9 and 2.5L was removed. He is being treated with antibiotics for SBP, lower leg cellulitis, and pneumonia.   Started on Lasix drip + IV albumin 6/10 for fluid overload Also on spironolactone 100 mg daily.  Goal of Therapy:  Electrolytes within normal limits  Plan:   Magnesium sulfate 4 grams IV x 1  repeat all electrolytes with morning labs.  Dallie Piles, PharmD Clinical Pharmacist 06/09/2020 7:16 AM

## 2020-06-09 NOTE — Progress Notes (Signed)
Delivered medicine to patient including antibiotic and delivered ginger ale on ice.  Patient was bathed and full linen change.

## 2020-06-09 NOTE — Progress Notes (Signed)
SYNOPSIS [name] with acute hypoxia with acute interstitial infiltrates with abd distention and lower ext edema, findings are concerning for progressive liver failure    CC  Follow up hypoxia   HPI Dx of liver cirrhosis Poor prognosis On bipap for hypoxia Alert and awake    EVENTS 6/8 admitted for hypoxia 6/9 Dx of liver cirrhosis and ascites, s/p paracentesis Albumin and lasix therapy 6/10 placed on biPAP, alert and awake 6/11 pending paracentesis today   VITALS: BP (!) 110/57   Pulse 95   Temp 98 F (36.7 C) (Oral)   Resp 12   Ht 5\' 9"  (1.753 m)   Wt 101.3 kg   SpO2 (!) 88%   BMI 32.98 kg/m     I/O last 3 completed shifts: In: 3054.5 [P.O.:914; I.V.:1307.8; IV Piggyback:832.7] Out: 6025 [Urine:6025] Total I/O In: 177.8 [I.V.:99; IV Piggyback:78.8] Out: 2275 [Urine:2275]  SpO2: (!) 88 % O2 Flow Rate (L/min): 55 L/min FiO2 (%): 95 %  Estimated body mass index is 32.98 kg/m as calculated from the following:   Height as of this encounter: 5\' 9"  (1.753 m).   Weight as of this encounter: 101.3 kg.      Physical Examination:   General Appearance: +distress  Neuro:without focal findings,  speech normal,  HEENT: PERRLA, EOM intact.   Pulmonary: normal breath sounds, No wheezing.  CardiovascularNormal S1,S2.  No m/r/g.   Abdomen:+distended Renal:  No costovertebral tenderness  GU:  Not performed at this time. Endoc: No evident thyromegaly Skin:   warm, no rashes, no ecchymosis  Extremities: normal, no cyanosis, clubbing. PSYCHIATRIC: Mood, affect within normal limits.     I personally reviewed Labs under Results section.   MEDICATIONS: I have reviewed all medications and confirmed regimen as documented   CULTURE RESULTS   Recent Results (from the past 240 hour(s))  SARS Coronavirus 2 by RT PCR (hospital order, performed in [facility] hospital lab) Nasopharyngeal Nasopharyngeal Swab     Status: None   Collection Time: 06/05/20   9:33 AM   Specimen: Nasopharyngeal Swab  Result Value Ref Range Status   SARS Coronavirus 2 NEGATIVE NEGATIVE Final    Comment: (NOTE) SARS-CoV-2 target nucleic acids are NOT DETECTED. The SARS-CoV-2 RNA is generally detectable in upper and lower respiratory specimens during the acute phase of infection. The lowest concentration of SARS-CoV-2 viral copies this assay can detect is 250 copies / mL. A negative result does not preclude SARS-CoV-2 infection and should not be used as the sole basis for treatment or other patient management decisions.  A negative result may occur with improper specimen collection / handling, submission of specimen other than nasopharyngeal swab, presence of viral mutation(s) within the areas targeted by this assay, and inadequate number of viral copies (<250 copies / mL). A negative result must be combined with clinical observations, patient history, and epidemiological information. Fact Sheet for Patients:   StrictlyIdeas.no Fact Sheet for Healthcare Providers: BankingDealers.co.za This test is not yet approved or cleared  by the [place] FDA and has been authorized for detection and/or diagnosis of SARS-CoV-2 by FDA under an Emergency Use Authorization (EUA).  This EUA will remain in effect (meaning this test can be used) for the duration of the COVID-19 declaration under Section 564(b)(1) of the Act, 21 U.S.C. section 360bbb-3(b)(1), unless the authorization is terminated or revoked sooner. Performed at [facility], [address], [place], [place] [address]   Blood culture (routine x 2)     Status:  None (Preliminary result)   Collection Time: 06/05/20 10:26 AM   Specimen: BLOOD  Result Value Ref Range Status   Specimen Description   Final    BLOOD Blood Culture results may not be optimal due to an excessive volume of blood received in culture bottles   Special Requests   Final    BOTTLES  DRAWN AEROBIC AND ANAEROBIC LEFT ANTECUBITAL   Culture   Final    NO GROWTH 4 DAYS Performed at [facility], [address] [place], [place] [address]    Report Status PENDING  Incomplete  Blood culture (routine x 2)     Status: None (Preliminary result)   Collection Time: 06/05/20 10:26 AM   Specimen: BLOOD  Result Value Ref Range Status   Specimen Description   Final    BLOOD Blood Culture results may not be optimal due to an excessive volume of blood received in culture bottles   Special Requests   Final    BOTTLES DRAWN AEROBIC AND ANAEROBIC RIGHT ANTECUBITAL   Culture   Final    NO GROWTH 4 DAYS Performed at [facility] [place], [address] [place], [place] [address]    Report Status PENDING  Incomplete  MRSA PCR Screening     Status: None   Collection Time: 06/05/20  2:42 PM   Specimen: Nasopharyngeal  Result Value Ref Range Status   MRSA by PCR NEGATIVE NEGATIVE Final    Comment:        The GeneXpert MRSA Assay (FDA approved for NASAL specimens only), is one component of a comprehensive MRSA colonization surveillance program. It is not intended to diagnose MRSA infection nor to guide or monitor treatment for MRSA infections. Performed at [facility], [address], [place], [place] [address]           IMAGING    No results found. BMP Latest Ref Rng & Units 06/09/2020 06/08/2020 06/08/2020  Glucose 70 - 99 mg/dL 307(H) 285(H) 167(H)  BUN 6 - 20 mg/dL 14 9 9   Creatinine 0.61 - 1.24 mg/dL 0.67 0.73 0.56(L)  Sodium 135 - 145 mmol/L 137 134(L) 136  Potassium 3.5 - 5.1 mmol/L 3.5 3.0(L) 2.7(LL)  Chloride 98 - 111 mmol/L 87(L) 87(L) 94(L)  CO2 22 - 32 mmol/L 36(H) 35(H) 33(H)  Calcium 8.9 - 10.3 mg/dL 7.1(L) 7.0(L) 6.6(L)         ASSESSMENT AND PLAN SYNOPSIS [name] with acute hypoxia with acute interstitial infiltrates with abd distention and lower ext edema, findings are concerning for progressive liver failure  with possible systolic heart failure  Severe ACUTE Hypoxic and Hypercapnic Respiratory Failure from pneumonia -Wean off biPAP to prn/qHS -Oxygen as needed -Continue IV abx -Will maintain IV steroids -Continue BD therapy  SEVERE COPD EXACERBATION -rhonchi with secretion clearance issues -continue IV steroids  -continue NEB THERAPY  -NIV as tolerated and qHS -morphine as needed for air hunger or tachypnea, watch for CO2 retention -wean fio2 as needed and tolerated  LIVER CIRRHOSIS +GIB -GI consultation -Several maroon to red stools over night -HgB delta over a gram last night -Coags mildly out, INR 1.5, FFP not likely to do much -ASA stopped, platelets are numerically adequate  -Maintain PPI and OCTREOTIDE -Nephrology assisting with volume status  -Albumin and lasix administration -Paracentesis planned for today   OBTAIN PICC LINE  ELECTROLYTES -follow labs as needed -replace as needed, particularly Mg/K -spironolactone added to reduce K loss -pharmacy consultation and following   DVT/GI  PRX ordered and assessed TRANSFUSIONS AS NEEDED MONITOR FSBS I Assessed the need for Labs I Assessed the need for Foley I Assessed the need for Central Venous Line Family Discussion when available I Assessed the need for Mobilization I made an Assessment of medications to be adjusted accordingly Safety Risk assessment completed  Prognosis is guarded  Critical care time 35 minutes    //Rossie Bretado

## 2020-06-09 NOTE — Progress Notes (Signed)
Delivered 11 units insulin per sliding scale

## 2020-06-09 NOTE — Progress Notes (Signed)
Spoke with Physician concerning patient increased insulin needs and hyperglycemic readings ACHS.  Physician ordered insulin resistant protocol and [place] for long term coverage.

## 2020-06-10 ENCOUNTER — Encounter: Payer: Self-pay | Admitting: Internal Medicine

## 2020-06-10 ENCOUNTER — Inpatient Hospital Stay: Payer: Self-pay

## 2020-06-10 DIAGNOSIS — F1010 Alcohol abuse, uncomplicated: Secondary | ICD-10-CM

## 2020-06-10 LAB — RENAL FUNCTION PANEL
Albumin: 3.5 g/dL (ref 3.5–5.0)
Anion gap: 8 (ref 5–15)
BUN: 18 mg/dL (ref 6–20)
CO2: 45 mmol/L — ABNORMAL HIGH (ref 22–32)
Calcium: 7.6 mg/dL — ABNORMAL LOW (ref 8.9–10.3)
Chloride: 83 mmol/L — ABNORMAL LOW (ref 98–111)
Creatinine, Ser: 0.59 mg/dL — ABNORMAL LOW (ref 0.61–1.24)
GFR calc Af Amer: >60 mL/min (ref >60)
GFR calc non Af Amer: >60 mL/min (ref >60)
Glucose, Bld: 230 mg/dL — ABNORMAL HIGH (ref 70–99)
Phosphorus: 3.7 mg/dL (ref 2.5–4.6)
Potassium: 2.9 mmol/L — ABNORMAL LOW (ref 3.5–5.1)
Sodium: 136 mmol/L (ref 135–145)

## 2020-06-10 LAB — CBC
HCT: 28.1 % — ABNORMAL LOW (ref 39.0–52.0)
Hemoglobin: 9.1 g/dL — ABNORMAL LOW (ref 13.0–17.0)
MCH: 29.2 pg (ref 26.0–34.0)
MCHC: 32.4 g/dL (ref 30.0–36.0)
MCV: 90.1 fL (ref 80.0–100.0)
Platelets: 337 10*3/uL (ref 150–400)
RBC: 3.12 MIL/uL — ABNORMAL LOW (ref 4.22–5.81)
RDW: 14.5 % (ref 11.5–15.5)
WBC: 11.1 10*3/uL — ABNORMAL HIGH (ref 4.0–10.5)
nRBC: 0 % (ref 0.0–0.2)

## 2020-06-10 LAB — CULTURE, BLOOD (ROUTINE X 2)
Culture: NO GROWTH
Culture: NO GROWTH

## 2020-06-10 LAB — GLUCOSE, CAPILLARY
Glucose-Capillary: 218 mg/dL — ABNORMAL HIGH (ref 70–99)
Glucose-Capillary: 301 mg/dL — ABNORMAL HIGH (ref 70–99)
Glucose-Capillary: 276 mg/dL — ABNORMAL HIGH (ref 70–99)
Glucose-Capillary: 279 mg/dL — ABNORMAL HIGH (ref 70–99)
Glucose-Capillary: 276 mg/dL — ABNORMAL HIGH (ref 70–99)

## 2020-06-10 LAB — MAGNESIUM: Magnesium: 1.5 mg/dL — ABNORMAL LOW (ref 1.7–2.4)

## 2020-06-10 MED ORDER — POTASSIUM CHLORIDE CRYS ER 20 MEQ PO TBCR
10.0000 meq | EXTENDED_RELEASE_TABLET | Freq: Three times a day (TID) | ORAL | Status: DC
  Administered 2020-06-10 – 2020-06-14 (×14): 10 meq via ORAL
  Filled 2020-06-10 (×14): qty 1

## 2020-06-10 MED ORDER — ACETAZOLAMIDE 250 MG PO TABS
500.0000 mg | ORAL_TABLET | Freq: Two times a day (BID) | ORAL | Status: AC
  Administered 2020-06-10 (×2): 500 mg via ORAL
  Filled 2020-06-10 (×2): qty 2

## 2020-06-10 MED ORDER — MAGNESIUM SULFATE 4 GM/100ML IV SOLN
4.0000 g | Freq: Once | INTRAVENOUS | Status: AC
  Administered 2020-06-10: 4 g via INTRAVENOUS
  Filled 2020-06-10: qty 100

## 2020-06-10 MED ORDER — LORAZEPAM 2 MG/ML IJ SOLN: 1.0000 mg | INTRAMUSCULAR | Status: DC | PRN

## 2020-06-10 NOTE — Progress Notes (Signed)
[place], [place] 06/10/20  Subjective:   Hospital day # 5   cvs:iv lasix drip; good UOP Pulm: requiring high flow nasal cannula.  Requirements have improved.  Patient appears more comfortable gi: Able to eat without nausea or vomiting Renal: able to void good.  No difficulty reported Hypokalemia noted on labs today   Objective:  Vital signs in last 24 hours:  Temp:  [97.6 F (36.4 C)-98.8 F (37.1 C)] 97.6 F (36.4 C) (06/13 0245) Pulse Rate:  [66-109] 66 (06/13 0600) Resp:  [12-40] 18 (06/13 0500) BP: (96-147)/(50-81) 134/71 (06/13 0700) SpO2:  [66 %-99 %] 89 % (06/13 0714) FiO2 (%):  [95 %-100 %] 95 % (06/13 0714) Weight:  [98.7 kg] 98.7 kg (06/13 0245)  Weight change:  Filed Weights   06/05/20 1400 06/07/20 0301 06/10/20 0245  Weight: 103.3 kg 101.3 kg 98.7 kg    Intake/Output:    Intake/Output Summary (Last 24 hours) at 06/10/2020 0846 Last data filed at 06/10/2020 0751 Gross per 24 hour  Intake 981.32 ml  Output 5375 ml  Net -4393.68 ml     Physical Exam: General:  No acute distress, laying in the bed  HEENT  anicteric, moist oral mucous membranes  Pulm/lungs  bilateral mild diffuse crackles, requiring high oxygen supplementation  CVS/Heart  regular, tachycardic  Abdomen:   Soft, mildly distended  Extremities:  2+ pitting edema bilaterally  Neurologic:  Alert, able to answer questions appropriate  Skin:  No acute rash  Access:        Basic Metabolic Panel:  Recent Labs  Lab 06/05/20 0933 06/06/20 0106 06/07/20 0814 06/07/20 0814 06/08/20 0129 06/08/20 0129 06/08/20 1515 06/09/20 0440 06/10/20 0617  NA 129*   < > 133*  --  136  --  134* 137 136  K 4.0   < > 3.9  --  2.7*  --  3.0* 3.5 2.9*  CL 97*   < > 97*  --  94*  --  87* 87* 83*  CO2 25   < > 30  --  33*  --  35* 36* 45*  GLUCOSE 183*   < > 142*  --  167*  --  285* 307* 230*  BUN 9   < > 8  --  9  --  9 14 18   CREATININE 0.83   < > 0.54*  --  0.56*  --   0.73 0.67 0.59*  CALCIUM 6.8*   < > 7.0*   < > 6.6*   < > 7.0* 7.1* 7.6*  MG 1.7  --   --   --  1.1*  --  1.8 1.5* 1.5*  PHOS 2.4*  --   --   --  3.9  --   --  2.7 3.7   < > = values in this interval not displayed.     CBC: Recent Labs  Lab 06/05/20 0933 06/05/20 0933 06/06/20 0106 06/07/20 0814 06/08/20 0129 06/09/20 0440 06/10/20 0617  WBC 31.9*   < > 19.1* 14.6* 12.7* 11.0* 11.1*  NEUTROABS 29.2*  --   --  12.1*  --   --   --   HGB 10.9*   < > 9.4* 10.0* 8.8* 8.6* 9.1*  HCT 34.5*   < > 29.8* 31.9* 27.2* 27.3* 28.1*  MCV 93.2   < > 94.6 94.4 91.0 91.9 90.1  PLT 452*   < > 340 366 302 331 337   < > = values in this  interval not displayed.      Lab Results  Component Value Date   HEPBSAG NON REACTIVE 06/05/2020   HEPBIGM NON REACTIVE 06/05/2020      Microbiology:  Recent Results (from the past 240 hour(s))  SARS Coronavirus 2 by RT PCR (hospital order, performed in [facility] hospital lab) Nasopharyngeal Nasopharyngeal Swab     Status: None   Collection Time: 06/05/20  9:33 AM   Specimen: Nasopharyngeal Swab  Result Value Ref Range Status   SARS Coronavirus 2 NEGATIVE NEGATIVE Final    Comment: (NOTE) SARS-CoV-2 target nucleic acids are NOT DETECTED. The SARS-CoV-2 RNA is generally detectable in upper and lower respiratory specimens during the acute phase of infection. The lowest concentration of SARS-CoV-2 viral copies this assay can detect is 250 copies / mL. A negative result does not preclude SARS-CoV-2 infection and should not be used as the sole basis for treatment or other patient management decisions.  A negative result may occur with improper specimen collection / handling, submission of specimen other than nasopharyngeal swab, presence of viral mutation(s) within the areas targeted by this assay, and inadequate number of viral copies (<250 copies / mL). A negative result must be combined with clinical observations, patient history, and  epidemiological information. Fact Sheet for Patients:   StrictlyIdeas.no Fact Sheet for Healthcare Providers: BankingDealers.co.za This test is not yet approved or cleared  by the [place] FDA and has been authorized for detection and/or diagnosis of SARS-CoV-2 by FDA under an Emergency Use Authorization (EUA).  This EUA will remain in effect (meaning this test can be used) for the duration of the COVID-19 declaration under Section 564(b)(1) of the Act, 21 U.S.C. section 360bbb-3(b)(1), unless the authorization is terminated or revoked sooner. Performed at [facility], [address] [place], [place] [address]   Blood culture (routine x 2)     Status: None   Collection Time: 06/05/20 10:26 AM   Specimen: BLOOD  Result Value Ref Range Status   Specimen Description   Final    BLOOD Blood Culture results may not be optimal due to an excessive volume of blood received in culture bottles   Special Requests   Final    BOTTLES DRAWN AEROBIC AND ANAEROBIC LEFT ANTECUBITAL   Culture   Final    NO GROWTH 5 DAYS Performed at [facility], [address] [place], [place] [address]    Report Status 06/10/2020 FINAL  Final  Blood culture (routine x 2)     Status: None   Collection Time: 06/05/20 10:26 AM   Specimen: BLOOD  Result Value Ref Range Status   Specimen Description   Final    BLOOD Blood Culture results may not be optimal due to an excessive volume of blood received in culture bottles   Special Requests   Final    BOTTLES DRAWN AEROBIC AND ANAEROBIC RIGHT ANTECUBITAL   Culture   Final    NO GROWTH 5 DAYS Performed at [facility], [address] [place], [place] [address]    Report Status 06/10/2020 FINAL  Final  MRSA PCR Screening     Status: None   Collection Time: 06/05/20  2:42 PM   Specimen: Nasopharyngeal  Result Value Ref Range Status   MRSA by PCR NEGATIVE NEGATIVE Final    Comment:         The GeneXpert MRSA Assay (FDA approved for NASAL specimens only), is one component of a comprehensive MRSA colonization surveillance program. It is  not intended to diagnose MRSA infection nor to guide or monitor treatment for MRSA infections. Performed at [facility], [address] [place], [place] [address]     Coagulation Studies: Recent Labs    06/08/20 0129  LABPROT 17.8*  INR 1.5*    Urinalysis: No results for input(s): COLORURINE, LABSPEC, PHURINE, GLUCOSEU, HGBUR, BILIRUBINUR, KETONESUR, PROTEINUR, UROBILINOGEN, NITRITE, LEUKOCYTESUR in the last 72 hours.  Invalid input(s): APPERANCEUR    Imaging: DG Chest Port 1 View  Result Date: 06/10/2020 CLINICAL DATA:  Acute respiratory failure EXAM: PORTABLE CHEST 1 VIEW COMPARISON:  06/05/2020 FINDINGS: Lower lung volumes with small left pleural effusion. There is still bilateral airspace disease. No visible pneumothorax. Right PICC with tip at the upper right atrium. Normal heart size for technique IMPRESSION: Decreased lung volumes and new small left pleural effusion. Persistent bilateral airspace disease. Electronically Signed   By: Monte Fantasia M.D.   On: 06/10/2020 07:49     Medications:   . sodium chloride Stopped (06/07/20 1809)  . cefTRIAXone (ROCEPHIN)  IV Stopped (06/09/20 1117)  . furosemide (LASIX) infusion 8 mg/hr (06/10/20 0700)  . octreotide  (SANDOSTATIN)    IV infusion 50 mcg/hr (06/10/20 0700)   . aspirin EC  81 mg Oral Daily  . budesonide (PULMICORT) nebulizer solution  0.5 mg Nebulization BID  . Chlorhexidine Gluconate Cloth  6 each Topical Daily  . ferrous sulfate  325 mg Oral BID WC  . folic acid  1 mg Oral Daily  . insulin aspart  0-20 Units Subcutaneous TID WC  . insulin aspart  0-5 Units Subcutaneous QHS  . insulin glargine  10 Units Subcutaneous QHS  . ipratropium-albuterol  3 mL Nebulization Q6H  . methylPREDNISolone (SOLU-MEDROL) injection  40 mg Intravenous Q12H  .  multivitamin with minerals  1 tablet Oral Daily  . pantoprazole (PROTONIX) IV  40 mg Intravenous Q12H  . pneumococcal 23 valent vaccine  0.5 mL Intramuscular Tomorrow-1000  . sodium chloride flush  10-40 mL Intracatheter Q12H  . spironolactone  100 mg Oral Daily  . thiamine injection  100 mg Intravenous Daily   And  . thiamine  100 mg Oral Daily   sodium chloride, acetaminophen **OR** acetaminophen, albuterol, dextromethorphan-guaiFENesin, iohexol, LORazepam, morphine injection, naphazoline-glycerin, nitroGLYCERIN, ondansetron **OR** ondansetron (ZOFRAN) IV, sodium chloride flush  Assessment/ Plan:  59 y.o. male with  alcohol abuse, hypertension, diabetes mellitus type II, anemia, tobacco use, who was    admitted on 06/05/2020 for Shortness of breath [R06.02] Acute respiratory failure with hypoxia ([place]) [J96.01] Community acquired pneumonia, unspecified laterality [J18.9]  1. Anasarca: secondary to hepatic cirrhosis. With Volume overload Metabolic alkalosis Echocardiogram with preserved systolic function.  Renal function within normal limits.  Status post paracentesis 6/9 with 2.5 liters removed.  Patient has developed metabolic alkalosis with bicarb of 45  - continue furosemide gtt but reduce dose to 4 mg/h - IV albumin 25mg  q8   - spironolactone Good response 06/12 0701 - 06/13 0700 In: 1027.3 [I.V.:757.2; IV Piggyback:270.2] Out: 5750 [Urine:5750]      Lab Results  Component Value Date   CREATININE 0.59 (L) 06/10/2020   CREATININE 0.67 06/09/2020   CREATININE 0.73 06/08/2020     2. 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 On CIWA protocol. Currently on PO thiamine and folate.  Hypoalbuminemia-IV albumin supplementation IV octreotide IV Solu-Medrol Management as per ICU team  3. Anemia: normocytic. With GI bleed.   4. Peritonitis: spontaneous bacterial.  - empiric antibiotics: ceftriaxone.  5. Acute respiratory failure:  CT chest with IV contrast  on June 9 - for pulmonary embolism.  Extensive bibasilar consolidation and diffuse bilateral groundglass opacities noted concerning for multifocal pneumonia likely from aspiration. Also noted to have hilar and mediastinal adenopathy (reactive) 2.8 cm mass in the right hilar region, likely enlarged lymph node. High O2 requirement and supplementation      LOS: 5 Lidie Glade Candiss Norse 6/13/20218:46 AM  [place], [place]  Note: This note was prepared with Dragon dictation. Any transcription errors are unintentional

## 2020-06-10 NOTE — Consult Note (Signed)
[facility] for Electrolyte Monitoring and Replacement   Recent Labs: Potassium (mmol/L)  Date Value  06/10/2020 2.9 (L)   Magnesium (mg/dL)  Date Value  06/10/2020 1.5 (L)   Calcium (mg/dL)  Date Value  06/10/2020 7.6 (L)   Albumin (g/dL)  Date Value  06/10/2020 3.5   Phosphorus (mg/dL)  Date Value  06/10/2020 3.7   Sodium (mmol/L)  Date Value  06/10/2020 136   Corrected Ca: 8.0 mg/dL  Assessment: Patient is a 59 y/o M with medical history including EtOH abuse who is admitted to the ICU with acute respiratory failure, anasarca, hypovolemic hyponatremia. Now with new diagnosis of liver cirrhosis. He is s/p paracentesis on 6/9 and 2.5L was removed. He is being treated with antibiotics for SBP, lower leg cellulitis, and pneumonia.   Started on Lasix drip + IV albumin 6/10 for fluid overload Also on spironolactone 100 mg daily.  Goal of Therapy:  Electrolytes within normal limits  Plan:   Repeat magnesium sulfate 4 grams IV x 1  Oral KCl 10 mEq TID per Dr Candiss Norse  repeat all electrolytes with morning labs.  Dallie Piles, PharmD Clinical Pharmacist 06/10/2020 9:15 AM

## 2020-06-10 NOTE — Progress Notes (Signed)
SYNOPSIS [name] with acute hypoxia with acute interstitial infiltrates with abd distention and lower ext edema, findings are concerning for progressive liver failure    CC  Follow up hypoxia   HPI Dx of liver cirrhosis Poor prognosis On bipap for hypoxia Alert and awake    EVENTS 6/8 admitted for hypoxia 6/9 Dx of liver cirrhosis and ascites, s/p paracentesis Albumin and lasix therapy 6/10 placed on biPAP, alert and awake 6/11 pending paracentesis today 6/13 overnight had episode of desaturation but recovered   VITALS: BP 134/71   Pulse 66   Temp 97.6 F (36.4 C) (Axillary)   Resp 18   Ht 5\' 9"  (1.753 m)   Wt 98.7 kg   SpO2 (!) 89%   BMI 32.13 kg/m     I/O last 3 completed shifts: In: 2305.8 [P.O.:674; I.V.:1184; IV Piggyback:447.8] Out: 6825 [Urine:6825] Total I/O In: -  Out: 1015 [Urine:1015]  SpO2: (!) 89 % O2 Flow Rate (L/min): 55 L/min FiO2 (%): 95 %  Estimated body mass index is 32.13 kg/m as calculated from the following:   Height as of this encounter: 5\' 9"  (1.753 m).   Weight as of this encounter: 98.7 kg.      Physical Examination:   General Appearance: +distress  Neuro:without focal findings,  speech normal,  HEENT: PERRLA, EOM intact.   Pulmonary: normal breath sounds, No wheezing.  CardiovascularNormal S1,S2.  No m/r/g.   Abdomen:+distended Renal:  No costovertebral tenderness  GU:  Not performed at this time. Endoc: No evident thyromegaly Skin:   warm, no rashes, no ecchymosis  Extremities: normal, no cyanosis, clubbing. PSYCHIATRIC: Mood, affect within normal limits.     I personally reviewed Labs under Results section.   MEDICATIONS: I have reviewed all medications and confirmed regimen as documented   CULTURE RESULTS   Recent Results (from the past 240 hour(s))  SARS Coronavirus 2 by RT PCR (hospital order, performed in [facility] hospital lab) Nasopharyngeal Nasopharyngeal Swab     Status: None    Collection Time: 06/05/20  9:33 AM   Specimen: Nasopharyngeal Swab  Result Value Ref Range Status   SARS Coronavirus 2 NEGATIVE NEGATIVE Final    Comment: (NOTE) SARS-CoV-2 target nucleic acids are NOT DETECTED. The SARS-CoV-2 RNA is generally detectable in upper and lower respiratory specimens during the acute phase of infection. The lowest concentration of SARS-CoV-2 viral copies this assay can detect is 250 copies / mL. A negative result does not preclude SARS-CoV-2 infection and should not be used as the sole basis for treatment or other patient management decisions.  A negative result may occur with improper specimen collection / handling, submission of specimen other than nasopharyngeal swab, presence of viral mutation(s) within the areas targeted by this assay, and inadequate number of viral copies (<250 copies / mL). A negative result must be combined with clinical observations, patient history, and epidemiological information. Fact Sheet for Patients:   StrictlyIdeas.no Fact Sheet for Healthcare Providers: BankingDealers.co.za This test is not yet approved or cleared  by the [place] FDA and has been authorized for detection and/or diagnosis of SARS-CoV-2 by FDA under an Emergency Use Authorization (EUA).  This EUA will remain in effect (meaning this test can be used) for the duration of the COVID-19 declaration under Section 564(b)(1) of the Act, 21 U.S.C. section 360bbb-3(b)(1), unless the authorization is terminated or revoked sooner. Performed at [facility], [address], [place], [place] [address]   Blood culture (routine x 2)  Status: None   Collection Time: 06/05/20 10:26 AM   Specimen: BLOOD  Result Value Ref Range Status   Specimen Description   Final    BLOOD Blood Culture results may not be optimal due to an excessive volume of blood received in culture bottles   Special Requests   Final     BOTTLES DRAWN AEROBIC AND ANAEROBIC LEFT ANTECUBITAL   Culture   Final    NO GROWTH 5 DAYS Performed at [facility], [address] [place], [place] [address]    Report Status 06/10/2020 FINAL  Final  Blood culture (routine x 2)     Status: None   Collection Time: 06/05/20 10:26 AM   Specimen: BLOOD  Result Value Ref Range Status   Specimen Description   Final    BLOOD Blood Culture results may not be optimal due to an excessive volume of blood received in culture bottles   Special Requests   Final    BOTTLES DRAWN AEROBIC AND ANAEROBIC RIGHT ANTECUBITAL   Culture   Final    NO GROWTH 5 DAYS Performed at [facility], [address] [place], [place] [address]    Report Status 06/10/2020 FINAL  Final  MRSA PCR Screening     Status: None   Collection Time: 06/05/20  2:42 PM   Specimen: Nasopharyngeal  Result Value Ref Range Status   MRSA by PCR NEGATIVE NEGATIVE Final    Comment:        The GeneXpert MRSA Assay (FDA approved for NASAL specimens only), is one component of a comprehensive MRSA colonization surveillance program. It is not intended to diagnose MRSA infection nor to guide or monitor treatment for MRSA infections. Performed at [facility], [address] [place],  [address]           IMAGING    DG Chest Port 1 View  Result Date: 06/10/2020 CLINICAL DATA:  Acute respiratory failure EXAM: PORTABLE CHEST 1 VIEW COMPARISON:  06/05/2020 FINDINGS: Lower lung volumes with small left pleural effusion. There is still bilateral airspace disease. No visible pneumothorax. Right PICC with tip at the upper right atrium. Normal heart size for technique IMPRESSION: Decreased lung volumes and new small left pleural effusion. Persistent bilateral airspace disease. Electronically Signed   By: Monte Fantasia M.D.   On: 06/10/2020 07:49   BMP Latest Ref Rng & Units 06/10/2020 06/09/2020 06/08/2020  Glucose 70 - 99 mg/dL 230(H) 307(H) 285(H)   BUN 6 - 20 mg/dL 18 14 9   Creatinine 0.61 - 1.24 mg/dL 0.59(L) 0.67 0.73  Sodium 135 - 145 mmol/L 136 137 134(L)  Potassium 3.5 - 5.1 mmol/L 2.9(L) 3.5 3.0(L)  Chloride 98 - 111 mmol/L 83(L) 87(L) 87(L)  CO2 22 - 32 mmol/L 45(H) 36(H) 35(H)  Calcium 8.9 - 10.3 mg/dL 7.6(L) 7.1(L) 7.0(L)         ASSESSMENT AND PLAN SYNOPSIS [name] with acute hypoxia with acute interstitial infiltrates with abd distention and lower ext edema, findings are concerning for progressive liver failure with possible systolic heart failure  Severe ACUTE Hypoxic and Hypercapnic Respiratory Failure from pneumonia -Wean off biPAP to prn/qHS -Oxygen via HHFNC -Continue IV abx -Will maintain IV steroids -Continue BD therapy -Volume reduction -7L  SEVERE COPD EXACERBATION -rhonchi with secretion clearance issues -continue IV steroids  -continue NEB THERAPY  -NIV as tolerated and qHS -morphine as needed for air hunger or tachypnea, watch for CO2 retention -wean fio2 as needed and tolerated  LIVER CIRRHOSIS +GIB -GI consultation -HgB stable over night -Coags mildly out, INR 1.5, FFP not likely to do much -ASA stopped, platelets are numerically adequate  -Maintain PPI and OCTREOTIDE -Nephrology assisting with volume status  -Albumin and lasix administration  Contraction alkalosis bicarb 45 with net 7L negative   Will hold lasix drip today to re-quilibrate  Diamox 500mg  PO q12 x 2 (IV is Psychologist, prison and probation services) -Paracentesis remains planned and re-ordered for tomorrow   ELECTROLYTES -follow labs as needed -replace as needed, particularly Mg/K -spironolactone added to reduce K loss -pharmacy consultation and following  GLUCOSE -Lantus and increased SSI yesterday   DVT/GI PRX ordered and assessed TRANSFUSIONS AS NEEDED MONITOR FSBS I Assessed the need for Labs I Assessed the need for Foley I Assessed the need for Central Venous Line Family Discussion when available I Assessed  the need for Mobilization I made an Assessment of medications to be adjusted accordingly Safety Risk assessment completed  Prognosis is guarded  Critical care time 35 minutes    //Joyia Riehle

## 2020-06-10 NOTE — ACP (Advance Care Planning) (Signed)
[place] introduced himself and asked if pt. was interested in completing paperwork to assign HCPOA (since S.O. is not legally married to pt.).  Pt. did want to complete MPOA and assigned S.O Christine as primary MPOA and her son Gwyndolyn Saxon as alt. MPOA.  When [facility] discussed Living Will w/pt. it became clear that pt. did not want life-prolonging treatment discontinued under any of the scenarios described and wanted full measures of care.  Omer advised pt. not to complete Living Will --> crossed it out in document. Document signed in presence of notary and witnesses; copy placed in paper chart and scanned into [employer].

## 2020-06-10 NOTE — Progress Notes (Signed)
Status maintains the same.  Some ronchi in the upper lobes bilaterally.  Patient edema still 4+ to the Bilateral lower extremities. RUE double lumen PICC still patent.  HFNC 50L 94% FiO2 saturating in the low 90's.  Patient urine output WDL.  His Lasix was reduced from 8mg /hr-4mg /hr this morning and then cancelled by early afternoon.  Consumed all meals.  Re-educated patient about need to comply with the fluid restriction as intake of fluid increases edema.  Patient was hesitant to listen but then reduced his requests for water.  Assisted patient to the toilet.  He did not have a B/M today.

## 2020-06-11 ENCOUNTER — Inpatient Hospital Stay: Payer: Self-pay

## 2020-06-11 DIAGNOSIS — K7030 Alcoholic cirrhosis of liver without ascites: Secondary | ICD-10-CM

## 2020-06-11 LAB — CBC
HCT: 29.6 % — ABNORMAL LOW (ref 39.0–52.0)
Hemoglobin: 9.1 g/dL — ABNORMAL LOW (ref 13.0–17.0)
MCH: 29 pg (ref 26.0–34.0)
MCHC: 30.7 g/dL (ref 30.0–36.0)
MCV: 94.3 fL (ref 80.0–100.0)
Platelets: 308 K/uL (ref 150–400)
RBC: 3.14 MIL/uL — ABNORMAL LOW (ref 4.22–5.81)
RDW: 14.4 % (ref 11.5–15.5)
WBC: 14.5 K/uL — ABNORMAL HIGH (ref 4.0–10.5)
nRBC: 0 % (ref 0.0–0.2)

## 2020-06-11 LAB — RENAL FUNCTION PANEL
Albumin: 3.2 g/dL — ABNORMAL LOW (ref 3.5–5.0)
Anion gap: 7 (ref 5–15)
BUN: 18 mg/dL (ref 6–20)
CO2: 30 mmol/L (ref 22–32)
Calcium: 7.6 mg/dL — ABNORMAL LOW (ref 8.9–10.3)
Chloride: 99 mmol/L (ref 98–111)
Creatinine, Ser: 0.52 mg/dL — ABNORMAL LOW (ref 0.61–1.24)
GFR calc Af Amer: >60 mL/min (ref >60)
GFR calc non Af Amer: >60 mL/min (ref >60)
Glucose, Bld: 213 mg/dL — ABNORMAL HIGH (ref 70–99)
Phosphorus: 2.9 mg/dL (ref 2.5–4.6)
Potassium: 3.6 mmol/L (ref 3.5–5.1)
Sodium: 136 mmol/L (ref 135–145)

## 2020-06-11 LAB — GLUCOSE, CAPILLARY
Glucose-Capillary: 201 mg/dL — ABNORMAL HIGH (ref 70–99)
Glucose-Capillary: 231 mg/dL — ABNORMAL HIGH (ref 70–99)
Glucose-Capillary: 381 mg/dL — ABNORMAL HIGH (ref 70–99)
Glucose-Capillary: 206 mg/dL — ABNORMAL HIGH (ref 70–99)

## 2020-06-11 LAB — MAGNESIUM: Magnesium: 2 mg/dL (ref 1.7–2.4)

## 2020-06-11 MED ORDER — METHYLPREDNISOLONE SODIUM SUCC 40 MG IJ SOLR
20.0000 mg | Freq: Two times a day (BID) | INTRAMUSCULAR | Status: DC
  Administered 2020-06-11 – 2020-06-12 (×2): 20 mg via INTRAVENOUS
  Filled 2020-06-11 (×2): qty 1

## 2020-06-11 NOTE — Progress Notes (Signed)
[facility] Gastroenterology Inpatient Progress Note  Subjective: Patient seen for f/u UGI bleed, spontaneous bacterial peritonitis. Patient awake, alert sitting up in chair in the ICU. Continues to require bipap support for breathing.Has 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 per intensivist.  No recurrent hemetemesis or abdominal pain.  Objective: Vital signs in last 24 hours: Temp:  [98 F (36.7 C)-98.1 F (36.7 C)] 98.1 F (36.7 C) (06/14 0000) Pulse Rate:  [59-95] 63 (06/14 0600) Resp:  [13-21] 13 (06/14 0600) BP: (106-130)/(54-77) 113/67 (06/14 0600) SpO2:  [89 %-100 %] 95 % (06/14 1353) FiO2 (%):  [40 %-80 %] 40 % (06/14 1353) Weight:  [82.5 kg] 82.5 kg (06/14 0500) Blood pressure 113/67, pulse 63, temperature 98.1 F (36.7 C), temperature source Oral, resp. rate 13, height 5\' 9"  (1.753 m), weight 82.5 kg, SpO2 95 %.    Intake/Output from previous day: 06/13 0701 - 06/14 0700 In: 743 [P.O.:240; I.V.:302.9; IV Piggyback:200.1] Out: 3875 [Urine:3875]  Intake/Output this shift: Total I/O In: 120 [P.O.:120] Out: 700 [Urine:700]   General appearance:  Alert, oriented x 3. Follow commands without difficulty. Resp:  Decreased breath sounds in bases. Cardio: RRR GI:  Mildly distended, no rebound or masses. Very minimal fluid wave on exam. Extremities:  No edema.   Lab Results: Results for orders placed or performed during the hospital encounter of 06/05/20 (from the past 24 hour(s))  Glucose, capillary     Status: Abnormal   Collection Time: 06/10/20  7:39 PM  Result Value Ref Range   Glucose-Capillary 279 (H) 70 - 99 mg/dL  Glucose, capillary     Status: Abnormal   Collection Time: 06/10/20 10:29 PM  Result Value Ref Range   Glucose-Capillary 276 (H) 70 - 99 mg/dL  Renal function panel     Status: Abnormal   Collection Time: 06/11/20  4:47 AM  Result Value Ref Range   Sodium 136 135 - 145 mmol/L   Potassium 3.6 3.5 - 5.1 mmol/L   Chloride 99 98 - 111 mmol/L   CO2 30  22 - 32 mmol/L   Glucose, Bld 213 (H) 70 - 99 mg/dL   BUN 18 6 - 20 mg/dL   Creatinine, Ser 0.52 (L) 0.61 - 1.24 mg/dL   Calcium 7.6 (L) 8.9 - 10.3 mg/dL   Phosphorus 2.9 2.5 - 4.6 mg/dL   Albumin 3.2 (L) 3.5 - 5.0 g/dL   GFR calc non Af Amer >60 >60 mL/min   GFR calc Af Amer >60 >60 mL/min   Anion gap 7 5 - 15  Magnesium     Status: None   Collection Time: 06/11/20  4:47 AM  Result Value Ref Range   Magnesium 2.0 1.7 - 2.4 mg/dL  CBC     Status: Abnormal   Collection Time: 06/11/20  4:47 AM  Result Value Ref Range   WBC 14.5 (H) 4.0 - 10.5 K/uL   RBC 3.14 (L) 4.22 - 5.81 MIL/uL   Hemoglobin 9.1 (L) 13.0 - 17.0 g/dL   HCT 29.6 (L) 39 - 52 %   MCV 94.3 80.0 - 100.0 fL   MCH 29.0 26.0 - 34.0 pg   MCHC 30.7 30.0 - 36.0 g/dL   RDW 14.4 11.5 - 15.5 %   Platelets 308 150 - 400 K/uL   nRBC 0.0 0.0 - 0.2 %  Glucose, capillary     Status: Abnormal   Collection Time: 06/11/20  7:11 AM  Result Value Ref Range   Glucose-Capillary 201 (H) 70 - 99 mg/dL  Glucose,  capillary     Status: Abnormal   Collection Time: 06/11/20 11:41 AM  Result Value Ref Range   Glucose-Capillary 231 (H) 70 - 99 mg/dL  Glucose, capillary     Status: Abnormal   Collection Time: 06/11/20  4:29 PM  Result Value Ref Range   Glucose-Capillary 381 (H) 70 - 99 mg/dL     Recent Labs    06/09/20 0440 06/10/20 0617 06/11/20 0447  WBC 11.0* 11.1* 14.5*  HGB 8.6* 9.1* 9.1*  HCT 27.3* 28.1* 29.6*  PLT 331 337 308   BMET Recent Labs    06/09/20 0440 06/10/20 0617 06/11/20 0447  NA 137 136 136  K 3.5 2.9* 3.6  CL 87* 83* 99  CO2 36* 45* 30  GLUCOSE 307* 230* 213*  BUN 14 18 18   CREATININE 0.67 0.59* 0.52*  CALCIUM 7.1* 7.6* 7.6*   LFT Recent Labs    06/11/20 0447  ALBUMIN 3.2*   PT/INR No results for input(s): LABPROT, INR in the last 72 hours. Hepatitis Panel No results for input(s): HEPBSAG, HCVAB, HEPAIGM, HEPBIGM in the last 72 hours. C-Diff No results for input(s): CDIFFTOX in the last  72 hours. No results for input(s): CDIFFPCR in the last 72 hours.   Studies/Results: DG Chest Port 1 View  Result Date: 06/10/2020 CLINICAL DATA:  Acute respiratory failure EXAM: PORTABLE CHEST 1 VIEW COMPARISON:  06/05/2020 FINDINGS: Lower lung volumes with small left pleural effusion. There is still bilateral airspace disease. No visible pneumothorax. Right PICC with tip at the upper right atrium. Normal heart size for technique IMPRESSION: Decreased lung volumes and new small left pleural effusion. Persistent bilateral airspace disease. Electronically Signed   By: Monte Fantasia M.D.   On: 06/10/2020 07:49   [place] ASCITES (ABDOMEN LIMITED)  Result Date: 06/11/2020 CLINICAL DATA:  History of cirrhosis. Please perform ascites search ultrasound and ultrasound-guided paracentesis as indicated. EXAM: LIMITED ABDOMEN ULTRASOUND FOR ASCITES TECHNIQUE: Limited ultrasound survey for ascites was performed in all four abdominal quadrants. COMPARISON:  Ascites search ultrasound-06/08/2020; CT abdomen pelvis-06/06/2020 FINDINGS: Sonographic evaluation of the abdomen demonstrates a potential trace amount of perihepatic ascites, too small to allow for safe ultrasound-guided paracentesis. No paracentesis attempted. IMPRESSION: Trace amount of intra-abdominal ascites, too small to allow for safe ultrasound-guided paracentesis, likely decreased in volume compared to abdominal CT performed 06/06/2020. No paracentesis attempted. Electronically Signed   By: Sandi Mariscal M.D.   On: 06/11/2020 10:19    Scheduled Inpatient Medications:    aspirin EC  81 mg Oral Daily   budesonide (PULMICORT) nebulizer solution  0.5 mg Nebulization BID   Chlorhexidine Gluconate Cloth  6 each Topical Daily   ferrous sulfate  325 mg Oral BID WC   folic acid  1 mg Oral Daily   insulin aspart  0-20 Units Subcutaneous TID WC   insulin aspart  0-5 Units Subcutaneous QHS   insulin glargine  10 Units Subcutaneous QHS    ipratropium-albuterol  3 mL Nebulization Q6H   methylPREDNISolone (SOLU-MEDROL) injection  20 mg Intravenous Q12H   multivitamin with minerals  1 tablet Oral Daily   pantoprazole (PROTONIX) IV  40 mg Intravenous Q12H   pneumococcal 23 valent vaccine  0.5 mL Intramuscular Tomorrow-1000   potassium chloride  10 mEq Oral TID   sodium chloride flush  10-40 mL Intracatheter Q12H   spironolactone  100 mg Oral Daily   thiamine injection  100 mg Intravenous Daily   And   thiamine  100 mg Oral Daily  Continuous Inpatient Infusions:    sodium chloride Stopped (06/07/20 1809)   cefTRIAXone (ROCEPHIN)  IV 2 g (06/11/20 1586)   octreotide  (SANDOSTATIN)    IV infusion 50 mcg/hr (06/11/20 0955)    PRN Inpatient Medications:  sodium chloride, acetaminophen **OR** acetaminophen, albuterol, dextromethorphan-guaiFENesin, iohexol, LORazepam, morphine injection, naphazoline-glycerin, nitroGLYCERIN, ondansetron **OR** ondansetron (ZOFRAN) IV, sodium chloride flush    Assessment:  59 y/o Hispanic male with a PMH of alcohol abuse (8-12 beers/daily) admitted for progressive shortness of breath, chest pain, abdominal distention, and bilateral lower extremity edema  1. Acute respiratory failure with hypoxia - requiring BiPAP to meet demands for probable hepatopulmonary syndrome.  2. Sepsis - bilateral lower leg cellulitis and bilateral pneumonia and new diagnosis of SBP  3. Decompensated cirrhosis with abdominal ascites - new diagnosis found during this admission, likely 2/2 alcohol.   4. Spontaneous Bacterial Peritonitis - PMN 412 initially, repeat tap attempted today but not performed due to "minimal fluid"  5. GI bleeding - No reported bloody stools over the past 3 days. Hemoglobin 8.8 with no overt gastrointestinal bleeding today On PPI and Octreotide.   Plan:  1. Continue IV antibiotics./ 2. IV acid suppression. 3. Continue po diet.  4. EGD at some point when more stable from  a pulmonary standpoint.  5. GI sign off at this time. Call me back in the interim if I can help.  Kassey Laforest K. Alice Reichert, M.D. 06/11/2020, 4:42 PM

## 2020-06-11 NOTE — Progress Notes (Signed)
CRITICAL CARE NOTE  [name] with acute hypoxia with acute interstitial infiltrates with abd distention and lower ext edema, findings are concerning for progressive liver failure    EVENTS 6/8 admitted for hypoxia 6/9 Dx of liver cirrhosis and ascites, s/p paracentesis Albumin and lasix therapy, DX of SBP, +LGIB 6/10 placed on biPAP, alert and awake 6/11 pending paracentesis today 6/13 overnight had episode of desaturation but recovered    CC  follow up respiratory failure Liver failure  SUBJECTIVE Dx of liver cirrhosis Poor prognosis On bipap for hypoxia Alert and awake Patient remains critically ill Prognosis is guarded On biPAP   BP 113/67   Pulse 63   Temp 98.1 F (36.7 C) (Oral)   Resp 13   Ht 5\' 9"  (1.753 m)   Wt 82.5 kg   SpO2 100%   BMI 26.86 kg/m    I/O last 3 completed shifts: In: 1155.3 [P.O.:240; I.V.:715.2; IV Piggyback:200.1] Out: 7350 [Urine:7350] No intake/output data recorded.  SpO2: 100 % O2 Flow Rate (L/min): 40 L/min FiO2 (%): 60 %  Estimated body mass index is 26.86 kg/m as calculated from the following:   Height as of this encounter: 5\' 9"  (1.753 m).   Weight as of this encounter: 82.5 kg.   Review of Systems:  Gen:  +resp distress, on biPAP HEENT: Denies blurred vision, double vision, ear pain, eye pain, hearing loss, nose bleeds, sore throat Cardiac:  No dizziness, chest pain or heaviness, chest tightness,edema, No JVD Resp:   +Shortness of breath Gi: Denies swallowing difficulty, stomach pain, nausea or vomiting, diarrhea, constipation, bowel incontinence Gu:  Denies bladder incontinence, burning urine Ext:   +swelling Other:  All other systems negative       PHYSICAL EXAMINATION:  GENERAL:critically ill appearing, +resp distress HEAD: Normocephalic, atraumatic.  EYES: Pupils equal, round, reactive to light.  No scleral icterus.  MOUTH: Moist mucosal membrane. NECK: Supple.  PULMONARY: +rhonchi,  +wheezing CARDIOVASCULAR: S1 and S2. Regular rate and rhythm. No murmurs, rubs, or gallops.  GASTROINTESTINAL: Soft, nontender, -distended.  Positive bowel sounds.   MUSCULOSKELETAL: +edema.  NEUROLOGIC: alert SKIN:intact,warm,dry  MEDICATIONS: I have reviewed all medications and confirmed regimen as documented   CULTURE RESULTS   Recent Results (from the past 240 hour(s))  SARS Coronavirus 2 by RT PCR (hospital order, performed in [facility] hospital lab) Nasopharyngeal Nasopharyngeal Swab     Status: None   Collection Time: 06/05/20  9:33 AM   Specimen: Nasopharyngeal Swab  Result Value Ref Range Status   SARS Coronavirus 2 NEGATIVE NEGATIVE Final    Comment: (NOTE) SARS-CoV-2 target nucleic acids are NOT DETECTED. The SARS-CoV-2 RNA is generally detectable in upper and lower respiratory specimens during the acute phase of infection. The lowest concentration of SARS-CoV-2 viral copies this assay can detect is 250 copies / mL. A negative result does not preclude SARS-CoV-2 infection and should not be used as the sole basis for treatment or other patient management decisions.  A negative result may occur with improper specimen collection / handling, submission of specimen other than nasopharyngeal swab, presence of viral mutation(s) within the areas targeted by this assay, and inadequate number of viral copies (<250 copies / mL). A negative result must be combined with clinical observations, patient history, and epidemiological information. Fact Sheet for Patients:   StrictlyIdeas.no Fact Sheet for Healthcare Providers: BankingDealers.co.za This test is not yet approved or cleared  by the [place] FDA and has been authorized for detection and/or diagnosis of  SARS-CoV-2 by FDA under an Emergency Use Authorization (EUA).  This EUA will remain in effect (meaning this test can be used) for the duration of the COVID-19 declaration  under Section 564(b)(1) of the Act, 21 U.S.C. section 360bbb-3(b)(1), unless the authorization is terminated or revoked sooner. Performed at [facility], [address] [place], [place] [address]   Blood culture (routine x 2)     Status: None   Collection Time: 06/05/20 10:26 AM   Specimen: BLOOD  Result Value Ref Range Status   Specimen Description   Final    BLOOD Blood Culture results may not be optimal due to an excessive volume of blood received in culture bottles   Special Requests   Final    BOTTLES DRAWN AEROBIC AND ANAEROBIC LEFT ANTECUBITAL   Culture   Final    NO GROWTH 5 DAYS Performed at [facility], [address] [place], [place] [address]    Report Status 06/10/2020 FINAL  Final  Blood culture (routine x 2)     Status: None   Collection Time: 06/05/20 10:26 AM   Specimen: BLOOD  Result Value Ref Range Status   Specimen Description   Final    BLOOD Blood Culture results may not be optimal due to an excessive volume of blood received in culture bottles   Special Requests   Final    BOTTLES DRAWN AEROBIC AND ANAEROBIC RIGHT ANTECUBITAL   Culture   Final    NO GROWTH 5 DAYS Performed at [facility], [address] [place], [place] [address]    Report Status 06/10/2020 FINAL  Final  MRSA PCR Screening     Status: None   Collection Time: 06/05/20  2:42 PM   Specimen: Nasopharyngeal  Result Value Ref Range Status   MRSA by PCR NEGATIVE NEGATIVE Final    Comment:        The GeneXpert MRSA Assay (FDA approved for NASAL specimens only), is one component of a comprehensive MRSA colonization surveillance program. It is not intended to diagnose MRSA infection nor to guide or monitor treatment for MRSA infections. Performed at [facility], [address] [place], [place] [address]          BMP Latest Ref Rng & Units 06/11/2020 06/10/2020 06/09/2020  Glucose 70 - 99 mg/dL 213(H) 230(H) 307(H)  BUN 6 - 20 mg/dL 18 18  14   Creatinine 0.61 - 1.24 mg/dL 0.52(L) 0.59(L) 0.67  Sodium 135 - 145 mmol/L 136 136 137  Potassium 3.5 - 5.1 mmol/L 3.6 2.9(L) 3.5  Chloride 98 - 111 mmol/L 99 83(L) 87(L)  CO2 22 - 32 mmol/L 30 45(H) 36(H)  Calcium 8.9 - 10.3 mg/dL 7.6(L) 7.6(L) 7.1(L)       Indwelling Urinary Catheter continued, requirement due to   Reason to continue Indwelling Urinary Catheter strict Intake/Output monitoring for hemodynamic instability       ASSESSMENT AND PLAN SYNOPSIS [name] with acute hypoxia with acute interstitial infiltrates with abd distention and lower ext edema, findings are concerning for progressive liver failure, 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   Severe ACUTE Hypoxic and Hypercapnic Respiratory Failure On biPAP Wean off as tolerated High risk for intubation  ACUTE DIASTOLIC CARDIAC FAILURE-  Diuresis as tolerated Consider albumin diamox slurry   obesity, possible OSA.   Will certainly impact respiratory mechanics,   CARDIAC ICU monitoring  ID Dx of SBP -continue IV abx as prescibed -follow up cultures  GI GI PROPHYLAXIS as indicated  NUTRITIONAL STATUS Nutrition Status:         DIET-->NPO Constipation protocol as indicated  ENDO - will use ICU hypoglycemic\Hyperglycemia protocol if indicated     ELECTROLYTES -follow labs as needed -replace as needed -pharmacy consultation and following   DVT/GI PRX ordered and assessed TRANSFUSIONS AS NEEDED MONITOR FSBS I Assessed the need for Labs I Assessed the need for Foley I Assessed the need for Central Venous Line Family Discussion when available I Assessed the need for Mobilization I made an Assessment of medications to be adjusted accordingly Safety Risk assessment completed   CASE DISCUSSED IN MULTIDISCIPLINARY ROUNDS WITH ICU TEAM  Critical Care Time devoted to patient care services described in this note is 34 minutes.   Overall, patient is critically ill, prognosis is  guarded.  Patient with Multiorgan failure and at high risk for cardiac arrest and death.    Corrin Parker, M.D.  [facility]  Medical Director [place] Director [facility] Cardio-Pulmonary Department

## 2020-06-11 NOTE — Progress Notes (Signed)
Patient sleeping comfortably at this time. bipap on standby if needed. Will continue to wean HFNC as tolerated

## 2020-06-11 NOTE — Consult Note (Signed)
[place] for Electrolyte Monitoring and Replacement   Recent Labs: Potassium (mmol/L)  Date Value  06/11/2020 3.6   Magnesium (mg/dL)  Date Value  06/11/2020 2.0   Calcium (mg/dL)  Date Value  06/11/2020 7.6 (L)   Albumin (g/dL)  Date Value  06/11/2020 3.2 (L)   Phosphorus (mg/dL)  Date Value  06/11/2020 2.9   Sodium (mmol/L)  Date Value  06/11/2020 136   Corrected Ca: 8.2 mg/dL  Assessment: Patient is a 59 y/o M with medical history including EtOH abuse who is admitted to the ICU with acute respiratory failure, anasarca, hypovolemic hyponatremia. Now with new diagnosis of liver cirrhosis. He is s/p paracentesis on 6/9 and 2.5L was removed. He is being treated with antibiotics for SBP, lower leg cellulitis, and pneumonia. Lasix drip has been stopped but he remains on spironolactone 100 mg daily.  Goal of Therapy:  Electrolytes within normal limits  Plan:   Oral KCl 10 mEq TID per Dr Candiss Norse  No additional electrolyte supplementation required today  repeat all electrolytes with morning labs.  Dallie Piles, PharmD Clinical Pharmacist 06/11/2020 6:52 AM

## 2020-06-11 NOTE — Progress Notes (Signed)
[place], [place] 06/11/20  Subjective:   Hospital day # 6  cvs: Patient is maintaining good urine output.  3800 cc yesterday.  Lasix drip has been discontinued  Pulm: requiring high flow nasal cannula.  Requirements have improved.  Patient appears more comfortable gi: Patient reports he is hungry.  Asking for food this morning Renal: able to void good.  No difficulty reported Hypokalemia has improved   Objective:  Vital signs in last 24 hours:  Temp:  [98 F (36.7 C)-98.1 F (36.7 C)] 98.1 F (36.7 C) (06/14 0000) Pulse Rate:  [59-95] 63 (06/14 0600) Resp:  [13-21] 13 (06/14 0600) BP: (106-130)/(54-77) 113/67 (06/14 0600) SpO2:  [89 %-100 %] 95 % (06/14 1353) FiO2 (%):  [40 %-80 %] 40 % (06/14 1353) Weight:  [82.5 kg] 82.5 kg (06/14 0500)  Weight change: -16.2 kg Filed Weights   06/07/20 0301 06/10/20 0245 06/11/20 0500  Weight: 101.3 kg 98.7 kg 82.5 kg    Intake/Output:    Intake/Output Summary (Last 24 hours) at 06/11/2020 1601 Last data filed at 06/11/2020 1330 Gross per 24 hour  Intake 410.06 ml  Output 2600 ml  Net -2189.94 ml     Physical Exam: General:  No acute distress, laying in the bed  HEENT  anicteric, moist oral mucous membranes  Pulm/lungs  bilateral mild crackles, requiring high oxygen supplementation  CVS/Heart  regular, tachycardic  Abdomen:   Soft, mildly distended  Extremities:  2+ pitting edema bilaterally-mid shins  Neurologic:  Alert, able to answer questions appropriate  Skin:  No acute rash  Access:        Basic Metabolic Panel:  Recent Labs  Lab 06/05/20 0933 06/06/20 0106 06/08/20 0129 06/08/20 0129 06/08/20 1515 06/08/20 1515 06/09/20 0440 06/10/20 0617 06/11/20 0447  NA 129*   < > 136  --  134*  --  137 136 136  K 4.0   < > 2.7*  --  3.0*  --  3.5 2.9* 3.6  CL 97*   < > 94*  --  87*  --  87* 83* 99  CO2 25   < > 33*  --  35*  --  36* 45* 30  GLUCOSE 183*   < > 167*  --  285*  --  307*  230* 213*  BUN 9   < > 9  --  9  --  14 18 18   CREATININE 0.83   < > 0.56*  --  0.73  --  0.67 0.59* 0.52*  CALCIUM 6.8*   < > 6.6*   < > 7.0*   < > 7.1* 7.6* 7.6*  MG 1.7  --  1.1*  --  1.8  --  1.5* 1.5* 2.0  PHOS 2.4*  --  3.9  --   --   --  2.7 3.7 2.9   < > = values in this interval not displayed.     CBC: Recent Labs  Lab 06/05/20 0933 06/06/20 0106 06/07/20 0814 06/08/20 0129 06/09/20 0440 06/10/20 0617 06/11/20 0447  WBC 31.9*   < > 14.6* 12.7* 11.0* 11.1* 14.5*  NEUTROABS 29.2*  --  12.1*  --   --   --   --   HGB 10.9*   < > 10.0* 8.8* 8.6* 9.1* 9.1*  HCT 34.5*   < > 31.9* 27.2* 27.3* 28.1* 29.6*  MCV 93.2   < > 94.4 91.0 91.9 90.1 94.3  PLT 452*   < > 366 302  331 337 308   < > = values in this interval not displayed.      Lab Results  Component Value Date   HEPBSAG NON REACTIVE 06/05/2020   HEPBIGM NON REACTIVE 06/05/2020      Microbiology:  Recent Results (from the past 240 hour(s))  SARS Coronavirus 2 by RT PCR (hospital order, performed in [facility] hospital lab) Nasopharyngeal Nasopharyngeal Swab     Status: None   Collection Time: 06/05/20  9:33 AM   Specimen: Nasopharyngeal Swab  Result Value Ref Range Status   SARS Coronavirus 2 NEGATIVE NEGATIVE Final    Comment: (NOTE) SARS-CoV-2 target nucleic acids are NOT DETECTED. The SARS-CoV-2 RNA is generally detectable in upper and lower respiratory specimens during the acute phase of infection. The lowest concentration of SARS-CoV-2 viral copies this assay can detect is 250 copies / mL. A negative result does not preclude SARS-CoV-2 infection and should not be used as the sole basis for treatment or other patient management decisions.  A negative result may occur with improper specimen collection / handling, submission of specimen other than nasopharyngeal swab, presence of viral mutation(s) within the areas targeted by this assay, and inadequate number of viral copies (<250 copies / mL). A  negative result must be combined with clinical observations, patient history, and epidemiological information. Fact Sheet for Patients:   StrictlyIdeas.no Fact Sheet for Healthcare Providers: BankingDealers.co.za This test is not yet approved or cleared  by the [place] FDA and has been authorized for detection and/or diagnosis of SARS-CoV-2 by FDA under an Emergency Use Authorization (EUA).  This EUA will remain in effect (meaning this test can be used) for the duration of the COVID-19 declaration under Section 564(b)(1) of the Act, 21 U.S.C. section 360bbb-3(b)(1), unless the authorization is terminated or revoked sooner. Performed at [facility], [address] [place], [place] [address]   Blood culture (routine x 2)     Status: None   Collection Time: 06/05/20 10:26 AM   Specimen: BLOOD  Result Value Ref Range Status   Specimen Description   Final    BLOOD Blood Culture results may not be optimal due to an excessive volume of blood received in culture bottles   Special Requests   Final    BOTTLES DRAWN AEROBIC AND ANAEROBIC LEFT ANTECUBITAL   Culture   Final    NO GROWTH 5 DAYS Performed at [facility], [address], [place], [place] [address]    Report Status 06/10/2020 FINAL  Final  Blood culture (routine x 2)     Status: None   Collection Time: 06/05/20 10:26 AM   Specimen: BLOOD  Result Value Ref Range Status   Specimen Description   Final    BLOOD Blood Culture results may not be optimal due to an excessive volume of blood received in culture bottles   Special Requests   Final    BOTTLES DRAWN AEROBIC AND ANAEROBIC RIGHT ANTECUBITAL   Culture   Final    NO GROWTH 5 DAYS Performed at [facility], [address], [place], [place] [address]    Report Status 06/10/2020 FINAL  Final  MRSA PCR Screening     Status: None   Collection Time: 06/05/20  2:42 PM   Specimen: Nasopharyngeal   Result Value Ref Range Status   MRSA by PCR NEGATIVE NEGATIVE Final    Comment:        The GeneXpert MRSA Assay (FDA approved for NASAL specimens only), is  one component of a comprehensive MRSA colonization surveillance program. It is not intended to diagnose MRSA infection nor to guide or monitor treatment for MRSA infections. Performed at [facility], [address] [place], [place] [address]     Coagulation Studies: No results for input(s): LABPROT, INR in the last 72 hours.  Urinalysis: No results for input(s): COLORURINE, LABSPEC, PHURINE, GLUCOSEU, HGBUR, BILIRUBINUR, KETONESUR, PROTEINUR, UROBILINOGEN, NITRITE, LEUKOCYTESUR in the last 72 hours.  Invalid input(s): APPERANCEUR    Imaging: DG Chest Port 1 View  Result Date: 06/10/2020 CLINICAL DATA:  Acute respiratory failure EXAM: PORTABLE CHEST 1 VIEW COMPARISON:  06/05/2020 FINDINGS: Lower lung volumes with small left pleural effusion. There is still bilateral airspace disease. No visible pneumothorax. Right PICC with tip at the upper right atrium. Normal heart size for technique IMPRESSION: Decreased lung volumes and new small left pleural effusion. Persistent bilateral airspace disease. Electronically Signed   By: Monte Fantasia M.D.   On: 06/10/2020 07:49   [place] ASCITES (ABDOMEN LIMITED)  Result Date: 06/11/2020 CLINICAL DATA:  History of cirrhosis. Please perform ascites search ultrasound and ultrasound-guided paracentesis as indicated. EXAM: LIMITED ABDOMEN ULTRASOUND FOR ASCITES TECHNIQUE: Limited ultrasound survey for ascites was performed in all four abdominal quadrants. COMPARISON:  Ascites search ultrasound-06/08/2020; CT abdomen pelvis-06/06/2020 FINDINGS: Sonographic evaluation of the abdomen demonstrates a potential trace amount of perihepatic ascites, too small to allow for safe ultrasound-guided paracentesis. No paracentesis attempted. IMPRESSION: Trace amount of intra-abdominal ascites, too small  to allow for safe ultrasound-guided paracentesis, likely decreased in volume compared to abdominal CT performed 06/06/2020. No paracentesis attempted. Electronically Signed   By: Sandi Mariscal M.D.   On: 06/11/2020 10:19     Medications:   . sodium chloride Stopped (06/07/20 1809)  . cefTRIAXone (ROCEPHIN)  IV 2 g (06/11/20 0952)  . octreotide  (SANDOSTATIN)    IV infusion 50 mcg/hr (06/11/20 0955)   . aspirin EC  81 mg Oral Daily  . budesonide (PULMICORT) nebulizer solution  0.5 mg Nebulization BID  . Chlorhexidine Gluconate Cloth  6 each Topical Daily  . ferrous sulfate  325 mg Oral BID WC  . folic acid  1 mg Oral Daily  . insulin aspart  0-20 Units Subcutaneous TID WC  . insulin aspart  0-5 Units Subcutaneous QHS  . insulin glargine  10 Units Subcutaneous QHS  . ipratropium-albuterol  3 mL Nebulization Q6H  . methylPREDNISolone (SOLU-MEDROL) injection  20 mg Intravenous Q12H  . multivitamin with minerals  1 tablet Oral Daily  . pantoprazole (PROTONIX) IV  40 mg Intravenous Q12H  . pneumococcal 23 valent vaccine  0.5 mL Intramuscular Tomorrow-1000  . potassium chloride  10 mEq Oral TID  . sodium chloride flush  10-40 mL Intracatheter Q12H  . spironolactone  100 mg Oral Daily  . thiamine injection  100 mg Intravenous Daily   And  . thiamine  100 mg Oral Daily   sodium chloride, acetaminophen **OR** acetaminophen, albuterol, dextromethorphan-guaiFENesin, iohexol, LORazepam, morphine injection, naphazoline-glycerin, nitroGLYCERIN, ondansetron **OR** ondansetron (ZOFRAN) IV, sodium chloride flush  Assessment/ Plan:  59 y.o. male with  alcohol abuse, hypertension, diabetes mellitus type II, anemia, tobacco use, who was    admitted on 06/05/2020 for Shortness of breath [R06.02] Acute respiratory failure with hypoxia ([place]) [J96.01] Community acquired pneumonia, unspecified laterality [J18.9]  1. Anasarca: secondary to hepatic cirrhosis. With Volume overload Metabolic  alkalosis Echocardiogram with preserved systolic function.  Renal function within normal limits.  Status post paracentesis 6/9 with 2.5 liters removed.   -  Volume status has improved significantly.  Patient can be transitioned to oral torsemide 20 mg once a day.  Titrate to response -We will sign off.  Please reconsult as necessary  Good response to Lasix infusion 06/13 0701 - 06/14 0700 In: 567 [P.O.:240; I.V.:302.9; IV Piggyback:200.1] Out: 3875 [Urine:3875]      Lab Results  Component Value Date   CREATININE 0.52 (L) 06/11/2020   CREATININE 0.59 (L) 06/10/2020   CREATININE 0.67 06/09/2020     2. 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 On CIWA protocol. Currently on PO thiamine and folate.  Hypoalbuminemia-IV albumin supplementation IV octreotide IV Solu-Medrol Management as per ICU team  3. Anemia: normocytic. With GI bleed.   4. Peritonitis: spontaneous bacterial.  - empiric antibiotics: ceftriaxone.  5. Acute respiratory failure: CT chest with IV contrast on June 9 - for pulmonary embolism.  Extensive bibasilar consolidation and diffuse bilateral groundglass opacities noted concerning for multifocal pneumonia likely from aspiration. Also noted to have hilar and mediastinal adenopathy (reactive) 2.8 cm mass in the right hilar region, likely enlarged lymph node. High O2 requirement and supplementation      LOS: 6 Byanka Landrus 6/14/20214:01 PM  [place], [place]  Note: This note was prepared with Dragon dictation. Any transcription errors are unintentional

## 2020-06-11 NOTE — Progress Notes (Signed)
Daily Progress Note   Patient Name: Collin Byrd       Date: 06/11/2020 DOB: 04-30-1961  Age: 59 y.o. MRN#: [id number] Attending Physician: Flora Lipps, MD Primary Care Physician: Patient, No Pcp Per Admit Date: 06/05/2020  Reason for Consultation/Follow-up: Establishing goals of care  Subjective: Patient is resting bed. He states he is feeling better, and feels that his edema is improving. Patient has been on HFNC. RT is at bedside and states they are weaning his HFNC. Will continue to monitor.   Length of Stay: 6  Current Medications: Scheduled Meds:  . aspirin EC  81 mg Oral Daily  . budesonide (PULMICORT) nebulizer solution  0.5 mg Nebulization BID  . Chlorhexidine Gluconate Cloth  6 each Topical Daily  . ferrous sulfate  325 mg Oral BID WC  . folic acid  1 mg Oral Daily  . insulin aspart  0-20 Units Subcutaneous TID WC  . insulin aspart  0-5 Units Subcutaneous QHS  . insulin glargine  10 Units Subcutaneous QHS  . ipratropium-albuterol  3 mL Nebulization Q6H  . methylPREDNISolone (SOLU-MEDROL) injection  20 mg Intravenous Q12H  . multivitamin with minerals  1 tablet Oral Daily  . pantoprazole (PROTONIX) IV  40 mg Intravenous Q12H  . pneumococcal 23 valent vaccine  0.5 mL Intramuscular Tomorrow-1000  . potassium chloride  10 mEq Oral TID  . sodium chloride flush  10-40 mL Intracatheter Q12H  . spironolactone  100 mg Oral Daily  . thiamine injection  100 mg Intravenous Daily   And  . thiamine  100 mg Oral Daily    Continuous Infusions: . sodium chloride Stopped (06/07/20 1809)  . cefTRIAXone (ROCEPHIN)  IV 2 g (06/11/20 0952)  . octreotide  (SANDOSTATIN)    IV infusion 50 mcg/hr (06/11/20 0955)    PRN Meds: sodium chloride, acetaminophen **OR** acetaminophen,  albuterol, dextromethorphan-guaiFENesin, iohexol, LORazepam, morphine injection, naphazoline-glycerin, nitroGLYCERIN, ondansetron **OR** ondansetron (ZOFRAN) IV, sodium chloride flush  Physical Exam Pulmonary:     Effort: Pulmonary effort is normal.  Musculoskeletal:     Right lower leg: Edema present.     Left lower leg: Edema present.  Skin:    General: Skin is warm and dry.  Neurological:     Mental Status: He is alert.  Vital Signs: BP 113/67   Pulse 63   Temp 98.1 F (36.7 C) (Oral)   Resp 13   Ht 5\' 9"  (1.753 m)   Wt 82.5 kg   SpO2 94%   BMI 26.86 kg/m  SpO2: SpO2: 94 % O2 Device: O2 Device: High Flow Nasal Cannula O2 Flow Rate: O2 Flow Rate (L/min): 35 L/min  Intake/output summary:   Intake/Output Summary (Last 24 hours) at 06/11/2020 1145 Last data filed at 06/11/2020 0900 Gross per 24 hour  Intake 518.28 ml  Output 3360 ml  Net -2841.72 ml   LBM: Last BM Date: 06/09/20 Baseline Weight: Weight: 98.4 kg Most recent weight: Weight: 82.5 kg       Palliative Assessment/Data:      Patient Active Problem List   Diagnosis Date Noted  . SBP (spontaneous bacterial peritonitis) ([place]) 06/07/2020  . Hepatic cirrhosis ([place]) 06/07/2020  . Edema leg   . Acute respiratory failure with hypoxia ([place]) 06/05/2020  . Hyponatremia 06/05/2020  . Elevated troponin 06/05/2020  . Leukocytosis 06/05/2020  . Bilateral lower leg cellulitis 06/05/2020  . Chest pain 06/05/2020  . Sepsis ([place]) 06/05/2020  . Alcohol abuse 06/05/2020    Palliative Care Assessment & Plan    Recommendations/Plan:  Continue current care.   Recommend palliative at D/C.   Code Status:    Code Status Orders  (From admission, onward)         Start     Ordered   06/05/20 1440  Full code  Continuous        06/05/20 1439        Code Status History    This patient has a current code status but no historical code status.   Advance Care Planning Activity    Advance  Directive Documentation     Most Recent Value  Type of Advance Directive Living will, Healthcare Power of Attorney  Pre-existing out of facility DNR order (yellow form or pink MOST form) --  "MOST" Form in Place? --       Prognosis:   Unable to determine  Care plan was discussed with CCM  Thank you for allowing the Palliative Medicine Team to assist in the care of this patient.   Total Time 15 min Prolonged Time Billed  no      Greater than 50%  of this time was spent counseling and coordinating care related to the above assessment and plan.  Asencion Gowda, NP  Please contact Palliative Medicine Team phone at [phone] for questions and concerns.

## 2020-06-11 NOTE — Progress Notes (Signed)
Inpatient Diabetes Program Recommendations  AACE/ADA: New Consensus Statement on Inpatient Glycemic Control   Target Ranges:  Prepandial:   less than 140 mg/dL      Peak postprandial:   less than 180 mg/dL (1-2 hours)      Critically ill patients:  140 - 180 mg/dL   Results for NEILAN, RIZZO (MRN [id number]) as of 06/11/2020 09:13  Ref. Range 06/10/2020 07:34 06/10/2020 11:53 06/10/2020 16:10 06/10/2020 19:39 06/10/2020 22:29 06/11/2020 07:11  Glucose-Capillary Latest Ref Range: 70 - 99 mg/dL 218 (H) 301 (H) 276 (H) 279 (H) 276 (H) 201 (H)   Review of Glycemic Control  Current orders for Inpatient glycemic control: Lantus 10 units QHS, Novolog 0-20 units TID with meals, Novolog 0-5 units QHS; Solumedrol 40 mg Q12H  Inpatient Diabetes Program Recommendations:    Insulin-If steroids are continued as ordered, please consider increasing Lantus to 14 units QHS and ordering Novolog 6 units TID with meals for meal coverage if patient eats at least 50% of meals.  Thanks, Barnie Alderman, RN, MSN, CDE Diabetes Coordinator Inpatient Diabetes Program [phone] (Team Pager from 8am to 5pm)

## 2020-06-11 NOTE — Progress Notes (Signed)
[place] visited pt. briefly while rounding on ICU; pt. said he was about to try to sleep but requested 'some kind of snack? Some crackers maybe?'  CH checked w/RN and agreed that apple sauce would be better for pt. due to his low-sodium diet.  Pt. grateful for food; [place] wished him good rest tonight.  No further needs.    06/10/20 2200  Clinical Encounter Type  Visited With Patient  Visit Type Follow-up;Social support;Critical Care

## 2020-06-12 ENCOUNTER — Encounter: Payer: Self-pay | Admitting: Internal Medicine

## 2020-06-12 DIAGNOSIS — K652 Spontaneous bacterial peritonitis: Secondary | ICD-10-CM

## 2020-06-12 LAB — CBC
HCT: 28.5 % — ABNORMAL LOW (ref 39.0–52.0)
Hemoglobin: 9.2 g/dL — ABNORMAL LOW (ref 13.0–17.0)
MCH: 28.9 pg (ref 26.0–34.0)
MCHC: 32.3 g/dL (ref 30.0–36.0)
MCV: 89.6 fL (ref 80.0–100.0)
Platelets: 302 10*3/uL (ref 150–400)
RBC: 3.18 MIL/uL — ABNORMAL LOW (ref 4.22–5.81)
RDW: 14.3 % (ref 11.5–15.5)
WBC: 11.4 10*3/uL — ABNORMAL HIGH (ref 4.0–10.5)
nRBC: 0 % (ref 0.0–0.2)

## 2020-06-12 LAB — GLUCOSE, CAPILLARY
Glucose-Capillary: 277 mg/dL — ABNORMAL HIGH (ref 70–99)
Glucose-Capillary: 179 mg/dL — ABNORMAL HIGH (ref 70–99)
Glucose-Capillary: 168 mg/dL — ABNORMAL HIGH (ref 70–99)
Glucose-Capillary: 164 mg/dL — ABNORMAL HIGH (ref 70–99)

## 2020-06-12 LAB — RENAL FUNCTION PANEL
Albumin: 3 g/dL — ABNORMAL LOW (ref 3.5–5.0)
Anion gap: 7 (ref 5–15)
BUN: 20 mg/dL (ref 6–20)
CO2: 25 mmol/L (ref 22–32)
Calcium: 8.1 mg/dL — ABNORMAL LOW (ref 8.9–10.3)
Chloride: 103 mmol/L (ref 98–111)
Creatinine, Ser: 0.54 mg/dL — ABNORMAL LOW (ref 0.61–1.24)
GFR calc Af Amer: >60 mL/min (ref >60)
GFR calc non Af Amer: >60 mL/min (ref >60)
Glucose, Bld: 330 mg/dL — ABNORMAL HIGH (ref 70–99)
Phosphorus: 2.6 mg/dL (ref 2.5–4.6)
Potassium: 4.5 mmol/L (ref 3.5–5.1)
Sodium: 135 mmol/L (ref 135–145)

## 2020-06-12 LAB — MAGNESIUM: Magnesium: 2 mg/dL (ref 1.7–2.4)

## 2020-06-12 MED ORDER — IPRATROPIUM-ALBUTEROL 0.5-2.5 (3) MG/3ML IN SOLN
3.0000 mL | Freq: Three times a day (TID) | RESPIRATORY_TRACT | Status: DC
  Administered 2020-06-12 – 2020-06-21 (×24): 3 mL via RESPIRATORY_TRACT
  Filled 2020-06-12 (×28): qty 3

## 2020-06-12 MED ORDER — METHYLPREDNISOLONE SODIUM SUCC 40 MG IJ SOLR
20.0000 mg | INTRAMUSCULAR | Status: DC
  Administered 2020-06-13 – 2020-06-17 (×5): 20 mg via INTRAVENOUS
  Filled 2020-06-12 (×5): qty 1

## 2020-06-12 MED ORDER — FUROSEMIDE 40 MG PO TABS
40.0000 mg | ORAL_TABLET | Freq: Every day | ORAL | Status: DC
  Administered 2020-06-12 – 2020-06-21 (×10): 40 mg via ORAL
  Filled 2020-06-12 (×2): qty 2
  Filled 2020-06-12: qty 1
  Filled 2020-06-12 (×2): qty 2
  Filled 2020-06-12: qty 1
  Filled 2020-06-12 (×3): qty 2
  Filled 2020-06-12: qty 1

## 2020-06-12 NOTE — Progress Notes (Signed)
RN received transcribed order to discontinue infusion from Dr. Alice Reichert.  Order placed in chart by this RN, however, mistakenly clicked on own name vs MD.  Pharmacy notified and guidance given to write note.

## 2020-06-12 NOTE — Progress Notes (Signed)
Pt has been requesting and receiving an extra tray or two in the evening (which does not follow his dietary restriction). Educated pt on the reason for his low sodium diet and that by eating more than the delivered/monitored amount he is placing himself at risk for further fluid accumulation. Additionally educated on fluid restriction since he doesn't follow that either. He said he understands but his actions and dietary habits indicate otherwise. If pt is not going to maintain the suggested diet for his condition in the hospital he is unlikely to do so at home. Needs reinforcement.

## 2020-06-12 NOTE — Evaluation (Signed)
Physical Therapy Evaluation Patient Details Name: Collin Byrd MRN: [id number] DOB: 08/15/1961 Today's Date: 06/12/2020   History of Present Illness  59 y/o M with medical history including EtOH abuse who is admitted to the ICU with acute respiratory failure, anasarca, hypovolemic hyponatremia. Now with new diagnosis of liver cirrhosis. He is s/p paracentesis on 6/9 and 2.5L was removed. He is being treated with antibiotics for SBP, lower leg cellulitis, and pneumonia.  Clinical Impression  Spoke with nursing prior to session and cleared for participation with PT.  On arrival pt on 4L with sats in the mid 80s, per conversation with nursing bumped up to 6L with sats increasing to the mid 90s.  He showed functional strength with U&LEs and was able to get to EOB and to standing with relative ease.  However he did struggle to keep O2 in the 90s consistently during even light activity.  Pt with only minimal c/o shortness of breath and repeatedly reports feeling fine even when O2 would dip with activity, he made good adjustments with cuing to breath deeply through the nose seemed to actually do quite well with this with minimal cuing to maintain this effort.  During ambulation w/o AD but on 6L his O2 slowly dropped to the high 80s, however on return to room sats continued to drop to the low 80s in sitting, even with pursed lip breathing and removal of mask sats continued to drop into the low 80s.  Nursing notified and to maintain close watch regarding vitals.  Once medically stable pt should be able to return home, per progress PT is recommending HHPT, though pt does not seem overly interested unless he absolutely must have it.    At time of this writing RT did ultimately need to be called shortly after PT session with HFNC placed shortly after completion of session.    Follow Up Recommendations Home health PT (pt indicates he expects to improve enough not to need)    Equipment Recommendations  None  recommended by PT (may need O2 per course of hospitalization)    Recommendations for Other Services       Precautions / Restrictions Precautions Precautions: Fall Precaution Comments: High fall Restrictions Weight Bearing Restrictions: No      Mobility  Bed Mobility Overal bed mobility: Needs Assistance Bed Mobility: Sit to Supine;Supine to Sit     Supine to sit: Supervision Sit to supine: Supervision   General bed mobility comments: able to get to sitting w/o direct assist, showed good safety and confidence  Transfers Overall transfer level: Modified independent Equipment used: None Transfers: Sit to/from Stand Sit to Stand: Modified independent (Device/Increase time)         General transfer comment: Pt was able to rise to standing w/o hesitation, showed good confident apart from need for pursposeful breathing t/o the effort.  Ambulation/Gait Ambulation/Gait assistance: Supervision Gait Distance (Feet): 65 Feet Assistive device: None       General Gait Details: Pt with slow but consistent and safe cadence.  Reports not being too far from his baseline.  6L t/o the effort with sats only slowly dropping from low 90s to high 80s, pt able to maintain good full breaths t/o the effort.  However on return to room while sitting in recliner he was able to maintain good breath but sats continued to drop into the low 80s.  nursing notified and watching, maintained on Sales executive  Modified Rankin (Stroke Patients Only)       Balance Overall balance assessment: Needs assistance Sitting-balance support: Feet supported;Single extremity supported Sitting balance-Leahy Scale: Good Sitting balance - Comments: Steady static sitting EOB. Requires at least 1 UE support during dynamic sitting tasks.   Standing balance support: During functional activity;No upper extremity supported Standing balance-Leahy Scale: Good Standing balance  comment: Pt with confident static and dynamic balance, no LOBs with ambulation                             Pertinent Vitals/Pain Pain Assessment: No/denies pain    Home Living Family/patient expects to be discharged to:: Private residence Living Arrangements: Spouse/significant other Available Help at Discharge: Family;Available PRN/intermittently Type of Home: Mobile home Home Access: Stairs to enter Entrance Stairs-Rails:  ("a fence" on the L) Technical brewer of Steps: 2 Home Layout: One level Home Equipment: None      Prior Function Level of Independence: Independent         Comments: Pt reports he was independent with all ADL/IADL. 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     Hand Dominance   Dominant Hand: Right    Extremity/Trunk Assessment   Upper Extremity Assessment Upper Extremity Assessment: Overall WFL for tasks assessed;Generalized weakness    Lower Extremity Assessment Lower Extremity Assessment: Overall WFL for tasks assessed;Generalized weakness    Cervical / Trunk Assessment Cervical / Trunk Assessment: Normal  Communication   Communication:  (ESL, but able to communicate effectively direct with PT)  Cognition Arousal/Alertness: Awake/alert Behavior During Therapy: WFL for tasks assessed/performed Overall Cognitive Status: Within Functional Limits for tasks assessed                                 General Comments: Pt has some difficulty answering questions initially. When asked if he would prefer a Spanish interpreter, he agrees. Is able to better elaborate/answer questions with assistance from interpreter.      General Comments General comments (skin integrity, edema, etc.): Pt on 4L on arrival with sats in mid 80s, per nurse bumped to 6L with sats increasing to mid 90s, numbers did drop significantly with ~60 ft of ambulation with low 80s after 3 minutes of seated rest post  ambulation.      Exercises Other Exercises Other Exercises: Pt educated on falls prevention strategies, energy conservation strategies with extensive education on use of pursed lip breathing strategies to maximzie safety and independence during ADL management, and routines modifications to support safety and fxl independence upon hospital DC.   Assessment/Plan    PT Assessment Patient needs continued PT services  PT Problem List Decreased strength;Decreased range of motion;Decreased activity tolerance;Decreased balance;Decreased mobility;Decreased coordination;Decreased safety awareness;Cardiopulmonary status limiting activity       PT Treatment Interventions Gait training;Stair training;Therapeutic activities;Functional mobility training;Therapeutic exercise;Balance training;Neuromuscular re-education;Patient/family education    PT Goals (Current goals can be found in the Care Plan section)  Acute Rehab PT Goals Patient Stated Goal: To Go Home PT Goal Formulation: With patient Time For Goal Achievement: 06/26/20 Potential to Achieve Goals: Fair    Frequency Min 2X/week   Barriers to discharge        Co-evaluation               AM-PAC PT "6 Clicks" Mobility  Outcome Measure Help needed turning from your back to your side  while in a flat bed without using bedrails?: None Help needed moving from lying on your back to sitting on the side of a flat bed without using bedrails?: None Help needed moving to and from a bed to a chair (including a wheelchair)?: None Help needed standing up from a chair using your arms (e.g., wheelchair or bedside chair)?: None Help needed to walk in hospital room?: A Little Help needed climbing 3-5 steps with a railing? : A Little 6 Click Score: 22    End of Session Equipment Utilized During Treatment: Gait belt;Oxygen (6L) Activity Tolerance: Patient limited by fatigue Patient left: in chair;with call bell/phone within reach Nurse  Communication: Mobility status (O2 t/o session) PT Visit Diagnosis: Muscle weakness (generalized) (M62.81);Difficulty in walking, not elsewhere classified (R26.2)    Time: 8483-5075 PT Time Calculation (min) (ACUTE ONLY): 28 min   Charges:   PT Evaluation $PT Eval Low Complexity: 1 Low PT Treatments $Gait Training: 8-22 mins        Kreg Shropshire, DPT 06/12/2020, 5:43 PM

## 2020-06-12 NOTE — Progress Notes (Signed)
Inpatient Diabetes Program Recommendations  AACE/ADA: New Consensus Statement on Inpatient Glycemic Control   Target Ranges:  Prepandial:   less than 140 mg/dL      Peak postprandial:   less than 180 mg/dL (1-2 hours)      Critically ill patients:  140 - 180 mg/dL   Results for KAVEN, CUMBIE (MRN [id number]) as of 06/12/2020 08:24  Ref. Range 06/11/2020 07:11 06/11/2020 11:41 06/11/2020 16:29 06/11/2020 20:54 06/12/2020 07:22  Glucose-Capillary Latest Ref Range: 70 - 99 mg/dL 201 (H) 231 (H) 381 (H) 206 (H) 277 (H)   Review of Glycemic Control  Current orders for Inpatient glycemic control: Lantus 10 units QHS, Novolog 0-20 units TID with meals, Novolog 0-5 units QHS; Solumedrol 20 mg Q12H  Inpatient Diabetes Program Recommendations:    Insulin-If steroids are continued as ordered, please consider increasing Lantus to 12 units QHS and ordering Novolog 5 units TID with meals for meal coverage if patient eats at least 50% of meals.  Thanks, Barnie Alderman, RN, MSN, CDE Diabetes Coordinator Inpatient Diabetes Program [phone] (Team Pager from 8am to 5pm)

## 2020-06-12 NOTE — Progress Notes (Addendum)
Daily Progress Note   Patient Name: Collin Byrd       Date: 06/12/2020 DOB: 1961-12-16  Age: 59 y.o. MRN#: [id number] Attending Physician: Flora Lipps, MD Primary Care Physician: Patient, No Pcp Per Admit Date: 06/05/2020  Reason for Consultation/Follow-up: Establishing goals of care  Subjective: Patient is resting bed. He is on [place] and off high flow cannula; pulmonary status appears to be improving. He states he is feeling better. He states he is getting bored being in the hospital. He tells me he is willing to make lifestyle modifications such as diet/fluid changes, not smoking or drinking, and taking medications and seeing providers as needed. He states "I want to try to live as long as I can." He discusses a desire to return to work if/when able.  Length of Stay: 7  Current Medications: Scheduled Meds:  . aspirin EC  81 mg Oral Daily  . budesonide (PULMICORT) nebulizer solution  0.5 mg Nebulization BID  . Chlorhexidine Gluconate Cloth  6 each Topical Daily  . ferrous sulfate  325 mg Oral BID WC  . folic acid  1 mg Oral Daily  . insulin aspart  0-20 Units Subcutaneous TID WC  . insulin aspart  0-5 Units Subcutaneous QHS  . insulin glargine  10 Units Subcutaneous QHS  . ipratropium-albuterol  3 mL Nebulization Q6H  . methylPREDNISolone (SOLU-MEDROL) injection  20 mg Intravenous Q12H  . multivitamin with minerals  1 tablet Oral Daily  . pantoprazole (PROTONIX) IV  40 mg Intravenous Q12H  . pneumococcal 23 valent vaccine  0.5 mL Intramuscular Tomorrow-1000  . potassium chloride  10 mEq Oral TID  . sodium chloride flush  10-40 mL Intracatheter Q12H  . spironolactone  100 mg Oral Daily  . thiamine injection  100 mg Intravenous Daily   And  . thiamine  100 mg Oral Daily     Continuous Infusions: . sodium chloride Stopped (06/07/20 1809)  . octreotide  (SANDOSTATIN)    IV infusion 50 mcg/hr (06/12/20 0229)    PRN Meds: sodium chloride, acetaminophen **OR** acetaminophen, albuterol, dextromethorphan-guaiFENesin, iohexol, LORazepam, morphine injection, naphazoline-glycerin, nitroGLYCERIN, ondansetron **OR** ondansetron (ZOFRAN) IV, sodium chloride flush  Physical Exam Pulmonary:     Effort: Pulmonary effort is normal.  Musculoskeletal:     Right lower leg: Edema present.     Left lower  leg: Edema present.  Skin:    General: Skin is warm and dry.  Neurological:     Mental Status: He is alert.             Vital Signs: BP 122/79 (BP Location: Left Arm)   Pulse 68   Temp 98.2 F (36.8 C) (Oral)   Resp (!) 23   Ht 5\' 9"  (1.753 m)   Wt 84.5 kg   SpO2 93%   BMI 27.51 kg/m  SpO2: SpO2: 93 % O2 Device: O2 Device: Nasal Cannula O2 Flow Rate: O2 Flow Rate (L/min): 4 L/min  Intake/output summary:   Intake/Output Summary (Last 24 hours) at 06/12/2020 0954 Last data filed at 06/12/2020 0700 Gross per 24 hour  Intake 2290 ml  Output 3275 ml  Net -985 ml   LBM: Last BM Date: 06/09/20 Baseline Weight: Weight: 98.4 kg Most recent weight: Weight: 84.5 kg       Palliative Assessment/Data:      Patient Active Problem List   Diagnosis Date Noted  . SBP (spontaneous bacterial peritonitis) ([place]) 06/07/2020  . Hepatic cirrhosis ([place]) 06/07/2020  . Edema leg   . Acute respiratory failure with hypoxia ([place]) 06/05/2020  . Hyponatremia 06/05/2020  . Elevated troponin 06/05/2020  . Leukocytosis 06/05/2020  . Bilateral lower leg cellulitis 06/05/2020  . Chest pain 06/05/2020  . Sepsis ([place]) 06/05/2020  . Alcohol abuse 06/05/2020    Palliative Care Assessment & Plan    Recommendations/Plan:  Continue current care.   Recommend palliative at D/C.   Code Status:    Code Status Orders  (From admission, onward)         Start      Ordered   06/05/20 1440  Full code  Continuous        06/05/20 1439        Code Status History    This patient has a current code status but no historical code status.   Advance Care Planning Activity    Advance Directive Documentation     Most Recent Value  Type of Advance Directive Living will, Healthcare Power of Attorney  Pre-existing out of facility DNR order (yellow form or pink MOST form) --  "MOST" Form in Place? --       Prognosis:  Poor longeterm   Thank you for allowing the Palliative Medicine Team to assist in the care of this patient.   Total Time 25 min Prolonged Time Billed  no      Greater than 50%  of this time was spent counseling and coordinating care related to the above assessment and plan.  Asencion Gowda, NP  Please contact Palliative Medicine Team phone at [phone] for questions and concerns.

## 2020-06-12 NOTE — Consult Note (Signed)
[place] for Electrolyte Monitoring and Replacement   Recent Labs: Potassium (mmol/L)  Date Value  06/12/2020 4.5   Magnesium (mg/dL)  Date Value  06/12/2020 2.0   Calcium (mg/dL)  Date Value  06/12/2020 8.1 (L)   Albumin (g/dL)  Date Value  06/12/2020 3.0 (L)   Phosphorus (mg/dL)  Date Value  06/12/2020 2.6   Sodium (mmol/L)  Date Value  06/12/2020 135   Corrected Ca: 8.9 mg/dL  Assessment: Patient is a 59 y/o M with medical history including EtOH abuse who is admitted to the ICU with acute respiratory failure, anasarca, hypovolemic hyponatremia. Now with new diagnosis of liver cirrhosis. He is s/p paracentesis on 6/9 and 2.5L was removed. He is being treated with antibiotics for SBP, lower leg cellulitis, and pneumonia.   Diuretics: furosemide 40 mg daily + spironolactone 100 mg daily.  Goal of Therapy:  Electrolytes within normal limits  Plan:   K 4.5 today and trending up - PO KCl 10 mEq TID. Furosemide 40 mg daily started  No additional electrolyte supplementation required today  Repeat all electrolytes with morning labs.  [place] Resident 06/12/2020 7:49 AM

## 2020-06-12 NOTE — Evaluation (Signed)
Occupational Therapy Evaluation Patient Details Name: Collin Byrd MRN: [id number] DOB: 1961/10/02 Today's Date: 06/12/2020    History of Present Illness Patient is a 59 y/o M with medical history including EtOH abuse who is admitted to the ICU with acute respiratory failure, anasarca, hypovolemic hyponatremia. Now with new diagnosis of liver cirrhosis. He is s/p paracentesis on 6/9 and 2.5L was removed. He is being treated with antibiotics for SBP, lower leg cellulitis, and pneumonia.   Clinical Impression   Collin Byrd seen for OT evaluation this date. Pt was independent in all ADL and functional mobility, living in a 1-level mobile home with his significant other. Pt denies home O2 use PTA. 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 Pt currently requires supervision for safety for functional mobility and BADL management due to current functional impairments (See OT Problem List below). Pt educated in energy conservation strategies including pursed lip breathing, activity pacing, modifications for sleep positioning, prioritizing of meaningful occupations, and falls prevention. Pt verbalized understanding and would benefit from additional skilled OT services to maximize recall and carryover of learned techniques and facilitate implementation of learned techniques into daily routines. Upon discharge, recommend [facility] services.     Follow Up Recommendations  Home health OT    Equipment Recommendations  3 in 1 bedside commode    Recommendations for Other Services       Precautions / Restrictions Precautions Precautions: Fall Precaution Comments: High fall Restrictions Weight Bearing Restrictions: No      Mobility Bed Mobility Overal bed mobility: Needs Assistance Bed Mobility: Sit to Supine;Supine to Sit     Supine to sit: Supervision Sit to supine: Supervision   General bed mobility comments: Pt requires increased time/effort to perform  sup<>sit. No physical assist required from therapist.  Transfers Overall transfer level: Needs assistance Equipment used: None Transfers: Sit to/from Stand Sit to Stand: Supervision;Min guard              Balance Overall balance assessment: Needs assistance Sitting-balance support: Feet supported;Single extremity supported Sitting balance-Leahy Scale: Fair Sitting balance - Comments: Steady static sitting EOB. Requires at least 1 UE support during dynamic sitting tasks.   Standing balance support: During functional activity;No upper extremity supported Standing balance-Leahy Scale: Good Standing balance comment: Steady standing/side-stepping EOB w/o UE support.                           ADL either performed or assessed with clinical judgement   ADL Overall ADL's : Needs assistance/impaired                                       General ADL Comments: Pt functionally limited by cardiopulmonary status and decreased activity tolerance. He requires supervision for safety to perform bed mobility and CGA for functional STS at EOB. Supervision to don bilateral hospital socks with moderate cueing for implementation of energy conservation strategies and adapted technique for reaching his feet.     Vision Baseline Vision/History:  (Pt endorses at least 3 year hx of L eye blurryness.) Patient Visual Report: No change from baseline       Perception     Praxis      Pertinent Vitals/Pain Pain Assessment: No/denies pain     Hand Dominance Right   Extremity/Trunk Assessment Upper Extremity Assessment Upper Extremity Assessment: Overall WFL for  tasks assessed (Grossly 4/5 t/o. WFL in BUE.)   Lower Extremity Assessment Lower Extremity Assessment: Overall WFL for tasks assessed   Cervical / Trunk Assessment Cervical / Trunk Assessment: Normal   Communication Communication Communication: Other (comment);Interpreter utilized (Pt speaks some English, but  does best with interpreter.)   Cognition Arousal/Alertness: Awake/alert Behavior During Therapy: WFL for tasks assessed/performed Overall Cognitive Status: Within Functional Limits for tasks assessed                                 General Comments: Pt has some difficulty answering questions initially. When asked if he would prefer a Spanish interpreter, he agrees. Is able to better elaborate/answer questions with assistance from interpreter.   General Comments  Pt on 8 L [place] t/o session. Recieved with Collin Byrd pulled out of nose with O2 sats 84-86%. Ot assists with repositioning and educates pt on PLB technique. With PLB pt rebounds to 90% on 8L. With sup>sit t/f pt noted to desat to 82%, however rebounds to 89-90% with therapeutic rest break and cues for PLB.    Exercises Other Exercises Other Exercises: Pt educated on falls prevention strategies, energy conservation strategies with extensive education on use of pursed lip breathing strategies to maximzie safety and independence during ADL management, and routines modifications to support safety and fxl independence upon hospital DC.   Shoulder Instructions      Home Living Family/patient expects to be discharged to:: Private residence Living Arrangements: Spouse/significant other Available Help at Discharge: Family;Available PRN/intermittently Type of Home: Mobile home Home Access: Stairs to enter Entrance Stairs-Number of Steps: 2 Entrance Stairs-Rails:  (Pt endorses a rail at back steps) Home Layout: One level     Bathroom Shower/Tub: Occupational psychologist: Standard     Home Equipment: None          Prior Functioning/Environment Level of Independence: Independent        Comments: Pt reports he was independent with all ADL/IADL. 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        OT Problem List: Cardiopulmonary status limiting activity;Increased edema;Decreased activity  tolerance;Decreased safety awareness;Decreased knowledge of use of DME or AE;Impaired balance (sitting and/or standing)      OT Treatment/Interventions: Self-care/ADL training;Therapeutic exercise;Therapeutic activities;DME and/or AE instruction;Patient/family education;Energy conservation    OT Goals(Current goals can be found in the care plan section) Acute Rehab OT Goals Patient Stated Goal: To Go Home OT Goal Formulation: With patient Time For Goal Achievement: 06/26/20 Potential to Achieve Goals: Good ADL Goals Pt Will Perform Lower Body Dressing: sit to/from stand;with modified independence;with adaptive equipment (c LRAD PRN for improved safety and fxl independence.) Additional ADL Goal #1: Pt will independently verbalize a plan to implement at least 3 energy conservation strategies into his daily routines and/or home environment for improved safety and fxl independence upon hospital DC.  OT Frequency: Min 1X/week   Barriers to D/C: Inaccessible home environment          Co-evaluation              AM-PAC OT "6 Clicks" Daily Activity     Outcome Measure Help from another person eating meals?: None Help from another person taking care of personal grooming?: A Little Help from another person toileting, which includes using toliet, bedpan, or urinal?: A Little Help from another person bathing (including washing, rinsing, drying)?: A Little Help from another person  to put on and taking off regular upper body clothing?: None Help from another person to put on and taking off regular lower body clothing?: A Little 6 Click Score: 20   End of Session Equipment Utilized During Treatment: Gait belt Nurse Communication: Mobility status;Other (comment) (Vitals t/o session.)  Activity Tolerance: Patient tolerated treatment well Patient left: in bed;with call bell/phone within reach;with bed alarm set;Other (comment) (W/ case management in room.)  OT Visit Diagnosis: Other  abnormalities of gait and mobility (R26.89)                Time: 1595-3967 OT Time Calculation (min): 38 min Charges:  OT General Charges $OT Visit: 1 Visit OT Evaluation $OT Eval Moderate Complexity: 1 Mod OT Treatments $Self Care/Home Management : 23-37 mins  Shara Blazing, M.S., OTR/L Ascom: [phone] 06/12/20, 3:39 PM

## 2020-06-12 NOTE — Progress Notes (Addendum)
CRITICAL CARE NOTE  [name] with acute hypoxia with acute interstitial infiltrates with abd distention and lower ext edema, findings are concerning for progressive liver failure    EVENTS 6/8 admitted for hypoxia 6/9 Dx of liver cirrhosis and ascites, s/p paracentesis Albumin and lasix therapy, DX of SBP, +LGIB 6/10 placed on biPAP, alert and awake 6/11 pending paracentesis today 6/13 overnight had episode of desaturation but recovered 6/14-6/15 tolerating biPAP at night fio2 improving     CC  Follow up  Liver failure   SUBJECTIVE Dx of liver cirrhosis Poor prognosis Swelling  Much improved abd ascites improved Alert and awake    BP 122/79 (BP Location: Left Arm)   Pulse 68   Temp 98.2 F (36.8 C) (Oral)   Resp (!) 23   Ht 5\' 9"  (1.753 m)   Wt 84.5 kg   SpO2 93%   BMI 27.51 kg/m    I/O last 3 completed shifts: In: 2530 [P.O.:1620; I.V.:810; IV Piggyback:100] Out: 5375 [Urine:5375] No intake/output data recorded.  SpO2: 93 % O2 Flow Rate (L/min): 4 L/min FiO2 (%): 35.5 %  Estimated body mass index is 27.51 kg/m as calculated from the following:   Height as of this encounter: 5\' 9"  (1.753 m).   Weight as of this encounter: 84.5 kg.    Review of Systems:  Gen:  Denies  fever, sweats, chills weight loss  HEENT: Denies blurred vision, double vision, ear pain, eye pain, hearing loss, nose bleeds, sore throat Cardiac:  No dizziness, chest pain or heaviness, chest tightness,edema, No JVD Resp:   No cough, -sputum production, -shortness of breath,-wheezing, -hemoptysis,  Gi: Denies swallowing difficulty, stomach pain, nausea or vomiting, diarrhea, constipation, bowel incontinence Gu:  Denies bladder incontinence, burning urine Ext:   +swelling Skin: Denies  skin rash, easy bruising or bleeding or hives Endoc:  Denies polyuria, polydipsia , polyphagia or weight change Psych:   Denies depression, insomnia or hallucinations  Other:  All other  systems negative      Physical Examination:   General Appearance: No distress  Neuro:without focal findings,  speech normal,  HEENT: PERRLA, EOM intact.   Pulmonary: normal breath sounds, No wheezing.  CardiovascularNormal S1,S2.  No m/r/g.   Abdomen: Benign, Soft, non-tender. Renal:  No costovertebral tenderness  GU:  Not performed at this time. Endoc: No evident thyromegaly Skin:   warm, no rashes, no ecchymosis  Extremities: normal, no cyanosis, clubbing. PSYCHIATRIC: Mood, affect within normal limits.   ALL OTHER ROS ARE NEGATIVE   MEDICATIONS: I have reviewed all medications and confirmed regimen as documented   CULTURE RESULTS   Recent Results (from the past 240 hour(s))  SARS Coronavirus 2 by RT PCR (hospital order, performed in [facility] hospital lab) Nasopharyngeal Nasopharyngeal Swab     Status: None   Collection Time: 06/05/20  9:33 AM   Specimen: Nasopharyngeal Swab  Result Value Ref Range Status   SARS Coronavirus 2 NEGATIVE NEGATIVE Final    Comment: (NOTE) SARS-CoV-2 target nucleic acids are NOT DETECTED. The SARS-CoV-2 RNA is generally detectable in upper and lower respiratory specimens during the acute phase of infection. The lowest concentration of SARS-CoV-2 viral copies this assay can detect is 250 copies / mL. A negative result does not preclude SARS-CoV-2 infection and should not be used as the sole basis for treatment or other patient management decisions.  A negative result may occur with improper specimen collection / handling, submission of specimen other than nasopharyngeal swab, presence of  viral mutation(s) within the areas targeted by this assay, and inadequate number of viral copies (<250 copies / mL). A negative result must be combined with clinical observations, patient history, and epidemiological information. Fact Sheet for Patients:   StrictlyIdeas.no Fact Sheet for Healthcare  Providers: BankingDealers.co.za This test is not yet approved or cleared  by the [place] FDA and has been authorized for detection and/or diagnosis of SARS-CoV-2 by FDA under an Emergency Use Authorization (EUA).  This EUA will remain in effect (meaning this test can be used) for the duration of the COVID-19 declaration under Section 564(b)(1) of the Act, 21 U.S.C. section 360bbb-3(b)(1), unless the authorization is terminated or revoked sooner. Performed at [facility], [address] [place], [place] [address]   Blood culture (routine x 2)     Status: None   Collection Time: 06/05/20 10:26 AM   Specimen: BLOOD  Result Value Ref Range Status   Specimen Description   Final    BLOOD Blood Culture results may not be optimal due to an excessive volume of blood received in culture bottles   Special Requests   Final    BOTTLES DRAWN AEROBIC AND ANAEROBIC LEFT ANTECUBITAL   Culture   Final    NO GROWTH 5 DAYS Performed at [facility], [address] [place], [place] [address]    Report Status 06/10/2020 FINAL  Final  Blood culture (routine x 2)     Status: None   Collection Time: 06/05/20 10:26 AM   Specimen: BLOOD  Result Value Ref Range Status   Specimen Description   Final    BLOOD Blood Culture results may not be optimal due to an excessive volume of blood received in culture bottles   Special Requests   Final    BOTTLES DRAWN AEROBIC AND ANAEROBIC RIGHT ANTECUBITAL   Culture   Final    NO GROWTH 5 DAYS Performed at [facility], [address] [place], [place] [address]    Report Status 06/10/2020 FINAL  Final  MRSA PCR Screening     Status: None   Collection Time: 06/05/20  2:42 PM   Specimen: Nasopharyngeal  Result Value Ref Range Status   MRSA by PCR NEGATIVE NEGATIVE Final    Comment:        The GeneXpert MRSA Assay (FDA approved for NASAL specimens only), is one component of a comprehensive MRSA  colonization surveillance program. It is not intended to diagnose MRSA infection nor to guide or monitor treatment for MRSA infections. Performed at [facility], [address] [place], [place] [address]          BMP Latest Ref Rng & Units 06/12/2020 06/11/2020 06/10/2020  Glucose 70 - 99 mg/dL 330(H) 213(H) 230(H)  BUN 6 - 20 mg/dL 20 18 18   Creatinine 0.61 - 1.24 mg/dL 0.54(L) 0.52(L) 0.59(L)  Sodium 135 - 145 mmol/L 135 136 136  Potassium 3.5 - 5.1 mmol/L 4.5 3.6 2.9(L)  Chloride 98 - 111 mmol/L 103 99 83(L)  CO2 22 - 32 mmol/L 25 30 45(H)  Calcium 8.9 - 10.3 mg/dL 8.1(L) 7.6(L) 7.6(L)       Indwelling Urinary Catheter continued, requirement due to   Reason to continue Indwelling Urinary Catheter strict Intake/Output monitoring for hemodynamic instability       ASSESSMENT AND PLAN SYNOPSIS  [name] with acute hypoxia with acute interstitial infiltrates with abd distention and lower ext edema, findings are concerning for progressive liver failure, likely hepatopulmonary  syndrome   resp failure and pneumonia slowly resolving [place] fio2 as tolerated  obesity, possible OSA.    BiPAP when appropriate as needed   ACUTE DIASTOLIC CARDIAC FAILURE-  Diuresis as tolerated Consider albumin diamox slurry PRN  ID Dx of SBP and LE cellulitis -continue IV abx as prescibed -follow up cultures   Liver cirrhosis Follow up GI recs   DIET-->as tolerated Constipation protocol as indicated  ENDO - will use ICU hypoglycemic\Hyperglycemia protocol if indicated   ELECTROLYTES -follow labs as needed -replace as needed -pharmacy consultation and following   DVT/GI PRX ordered and assessed TRANSFUSIONS AS NEEDED MONITOR FSBS I Assessed the need for Labs I Assessed the need for Foley I Assessed the need for Central Venous Line Family Discussion when available I Assessed the need for Mobilization I made an Assessment of medications to be  adjusted accordingly Safety Risk assessment completed  CASE DISCUSSED IN MULTIDISCIPLINARY ROUNDS WITH ICU TEAM   Transfer to [facility] service 6/16  Corrin Parker, M.D.  [facility]  Medical Director Marissa Director [place] Department

## 2020-06-12 NOTE — Progress Notes (Signed)
Accepted patient from PCCM Dr. Mortimer Fries to be picked up by [facility] on 06/13/2020 at 7 AM.  Admitted for acute respiratory failure with hypoxia -initially requiring BiPAP for probable hepatopulmonary syndrome.  Now weaned off to 4 L oxygen via nasal cannula.  Diuresed with Lasix drip.

## 2020-06-12 NOTE — Progress Notes (Signed)
RT called to room for pt desat after PT. Pt sat in low 70's. Pt placed on HHFNC 45L 75% for sat to maintain in low 90's. Will attempt to titrate later tonight

## 2020-06-12 NOTE — TOC Initial Note (Signed)
Transition of Care [facility]) - Initial/Assessment Note    Patient Details  Name: Collin Byrd MRN: [id number] Date of Birth: 03/15/1961  [facility] CM/SW Contact:    Shelbie Ammons, RN Phone Number: 06/12/2020, 2:48 PM  Clinical Narrative:   RNCM received consult for substance abuse counseling. Patient assessed at bedside however was pre-occupied with tv. Patient was agreeable to counseling materials and reported he would follow up after discharge. Did not want CM to leave in room and requested they be placed in chart to go home with him.  RNCM placed SA counseling resources in chart.                       Patient Goals and CMS Choice        Expected Discharge Plan and Services                                                Prior Living Arrangements/Services                       Activities of Daily Living Home Assistive Devices/Equipment: None ADL Screening (condition at time of admission) Patient's cognitive ability adequate to safely complete daily activities?: Yes Is the patient deaf or have difficulty hearing?: No Does the patient have difficulty seeing, even when wearing glasses/contacts?: No Does the patient have difficulty concentrating, remembering, or making decisions?: No Patient able to express need for assistance with ADLs?: Yes Does the patient have difficulty dressing or bathing?: No Independently performs ADLs?: Yes (appropriate for developmental age) Does the patient have difficulty walking or climbing stairs?: No Weakness of Legs: None Weakness of Arms/Hands: None  Permission Sought/Granted                  Emotional Assessment              Admission diagnosis:  Shortness of breath [R06.02] Acute respiratory failure with hypoxia ([place]) [J96.01] Community acquired pneumonia, unspecified laterality [J18.9] Patient Active Problem List   Diagnosis Date Noted  . SBP (spontaneous bacterial peritonitis) ([place])  06/07/2020  . Hepatic cirrhosis ([place]) 06/07/2020  . Edema leg   . Acute respiratory failure with hypoxia ([place]) 06/05/2020  . Hyponatremia 06/05/2020  . Elevated troponin 06/05/2020  . Leukocytosis 06/05/2020  . Bilateral lower leg cellulitis 06/05/2020  . Chest pain 06/05/2020  . Sepsis ([place]) 06/05/2020  . Alcohol abuse 06/05/2020   PCP:  Patient, No Pcp Per Pharmacy:  No Pharmacies Listed    Social Determinants of Health (SDOH) Interventions    Readmission Risk Interventions No flowsheet data found.

## 2020-06-13 ENCOUNTER — Inpatient Hospital Stay: Payer: Self-pay

## 2020-06-13 LAB — GLUCOSE, CAPILLARY
Glucose-Capillary: 138 mg/dL — ABNORMAL HIGH (ref 70–99)
Glucose-Capillary: 350 mg/dL — ABNORMAL HIGH (ref 70–99)
Glucose-Capillary: 141 mg/dL — ABNORMAL HIGH (ref 70–99)
Glucose-Capillary: 179 mg/dL — ABNORMAL HIGH (ref 70–99)

## 2020-06-13 LAB — BASIC METABOLIC PANEL
Anion gap: 3 — ABNORMAL LOW (ref 5–15)
BUN: 19 mg/dL (ref 6–20)
CO2: 33 mmol/L — ABNORMAL HIGH (ref 22–32)
Calcium: 7.9 mg/dL — ABNORMAL LOW (ref 8.9–10.3)
Chloride: 99 mmol/L (ref 98–111)
Creatinine, Ser: 0.54 mg/dL — ABNORMAL LOW (ref 0.61–1.24)
GFR calc Af Amer: >60 mL/min (ref >60)
GFR calc non Af Amer: >60 mL/min (ref >60)
Glucose, Bld: 132 mg/dL — ABNORMAL HIGH (ref 70–99)
Potassium: 3.7 mmol/L (ref 3.5–5.1)
Sodium: 135 mmol/L (ref 135–145)

## 2020-06-13 LAB — MAGNESIUM: Magnesium: 1.7 mg/dL (ref 1.7–2.4)

## 2020-06-13 LAB — PHOSPHORUS: Phosphorus: 3.4 mg/dL (ref 2.5–4.6)

## 2020-06-13 MED ORDER — FUROSEMIDE 10 MG/ML IJ SOLN
40.0000 mg | Freq: Once | INTRAMUSCULAR | Status: AC
  Administered 2020-06-13: 40 mg via INTRAVENOUS
  Filled 2020-06-13: qty 4

## 2020-06-13 MED ORDER — SPIRONOLACTONE 25 MG PO TABS
100.0000 mg | ORAL_TABLET | Freq: Two times a day (BID) | ORAL | Status: DC
  Administered 2020-06-13 – 2020-06-16 (×6): 100 mg via ORAL
  Filled 2020-06-13 (×6): qty 4

## 2020-06-13 MED ORDER — MAGNESIUM SULFATE 2 GM/50ML IV SOLN
2.0000 g | Freq: Once | INTRAVENOUS | Status: AC
  Administered 2020-06-13: 2 g via INTRAVENOUS
  Filled 2020-06-13: qty 50

## 2020-06-13 NOTE — Progress Notes (Signed)
PROGRESS NOTE    Collin Byrd  ZJI:[id number] DOB: March 22, 1961 DOA: 06/05/2020 PCP: Patient, No Pcp Per  Brief Narrative:  [name] with acute hypoxia with acute interstitial infiltrates with abd distention and lower ext edema, findings are concerning for progressive liver failure   6/8 admitted for hypoxia 6/9 Dx of liver cirrhosis and ascites, s/p paracentesis Albumin and lasix therapy, DX of SBP, +LGIB 6/10 placed on biPAP, alert and awake 6/11 pending paracentesis today 6/13 overnight had episode of desaturation but recovered 6/14-6/15 tolerating biPAP at night fio2 improving 6/16 hypoxia with PT, no increased WOB  This morning patient still on heated high flow nasal cannula.  He is mentating clearly.  No increased work of breathing.  Appears a bit diaphoretic otherwise stable.  Answers all questions appropriately.  RN and RT attempting to wean down oxygen.  Case discussed with PCCM attending.  Dose of spironolactone increased.   Assessment & Plan:   Principal Problem:   Acute respiratory failure with hypoxia (HCC) Active Problems:   Hyponatremia   Elevated troponin   Leukocytosis   Bilateral lower leg cellulitis   Chest pain   Sepsis (HCC)   Alcohol abuse   Edema leg   SBP (spontaneous bacterial peritonitis) (HCC)   Hepatic cirrhosis (HCC)  Acute hypoxic and hypercapnic respiratory failure Slowly improving Secondary to 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 Continue oxygen as needed, titrate as tolerated Bronchodilator therapy As needed BiPAP Diuresis, Lasix and spironolactone Dose of spironolactone increased, currently 100 mg twice daily  Acute on chronic diastolic heart failure Lasix as above Oxygen, wean as tolerated   Presumed spontaneous bacterial peritonitis Lower extremity cellulitis Acute community-acquired pneumonia Sepsis secondary to above, improved Completed course of IV antibiotic  Acute liver failure Decompensated cirrhosis with  abdominal ascites GI bleed Secondary to alcohol induced liver disease Fortunately this may be progressive Prognosis overall be poor Patient started by palliative care At this time still pursuing full scope of treatment GI no longer following Can consider EGD when/if patient more stable from pulmonary standpoint Completed antibiotics for SBP On IV acid suppression On steroids for acute hepatitis.  Unclear role for this at this time    DVT prophylaxis: SCDs Code Status: Full Family Communication: None today Disposition Plan: Status is: Inpatient  Remains inpatient appropriate because:Inpatient level of care appropriate due to severity of illness   Dispo: The patient is from: Home              Anticipated d/c is to: Home              Anticipated d/c date is: > 3 days              Patient currently is not medically stable to d/c.   Still severely hypoxemic secondary to hepatopulmonary syndrome.  Attempt to wean down oxygen.  Overall prognosis remains poor.      Consultants:   Pulmonary  Procedures:   None  Antimicrobials: none    Subjective: Seen and examined.  No acute distress.  Remains markedly hypoxic  Objective: Vitals:   06/13/20 0415 06/13/20 0737 06/13/20 0800 06/13/20 1200  BP: (!) 96/52  104/65 102/78  Pulse: 70 76 77 77  Resp: 15 20 18    Temp:   98 F (36.7 C) 98.2 F (36.8 C)  TempSrc:   Oral Oral  SpO2: 100% 94% 92% 94%  Weight:      Height:        Intake/Output Summary (Last 24 hours) at 06/13/2020  1528 Last data filed at 06/13/2020 1300 Gross per 24 hour  Intake --  Output 2200 ml  Net -2200 ml   Filed Weights   06/11/20 0500 06/12/20 0433 06/13/20 0400  Weight: 82.5 kg 84.5 kg 82.4 kg    Examination:  General exam: Appears calm and comfortable  Respiratory system: Clear to auscultation.  Breath sounds decreased at bases.  Normal work of breathing Cardiovascular system: S1 & S2 heard, RRR. No JVD, murmurs, rubs, gallops or  clicks. No pedal edema. Gastrointestinal system: Abdomen is nondistended, soft and nontender. No organomegaly or masses felt. Normal bowel sounds heard. Central nervous system: Alert and oriented. No focal neurological deficits. Extremities: Symmetric 5 x 5 power. Skin: No rashes, lesions or ulcers Psychiatry: Judgement and insight appear normal. Mood & affect appropriate.     Data Reviewed: I have personally reviewed following labs and imaging studies  CBC: Recent Labs  Lab 06/07/20 0814 06/07/20 0814 06/08/20 0129 06/09/20 0440 06/10/20 0617 06/11/20 0447 06/12/20 0424  WBC 14.6*   < > 12.7* 11.0* 11.1* 14.5* 11.4*  NEUTROABS 12.1*  --   --   --   --   --   --   HGB 10.0*   < > 8.8* 8.6* 9.1* 9.1* 9.2*  HCT 31.9*   < > 27.2* 27.3* 28.1* 29.6* 28.5*  MCV 94.4   < > 91.0 91.9 90.1 94.3 89.6  PLT 366   < > 302 331 337 308 302   < > = values in this interval not displayed.   Basic Metabolic Panel: Recent Labs  Lab 06/09/20 0440 06/10/20 0617 06/11/20 0447 06/12/20 0424 06/13/20 0416  NA 137 136 136 135 135  K 3.5 2.9* 3.6 4.5 3.7  CL 87* 83* 99 103 99  CO2 36* 45* 30 25 33*  GLUCOSE 307* 230* 213* 330* 132*  BUN 14 18 18 20 19   CREATININE 0.67 0.59* 0.52* 0.54* 0.54*  CALCIUM 7.1* 7.6* 7.6* 8.1* 7.9*  MG 1.5* 1.5* 2.0 2.0 1.7  PHOS 2.7 3.7 2.9 2.6 3.4   GFR: Estimated Creatinine Clearance: 101.9 mL/min (A) (by C-G formula based on SCr of 0.54 mg/dL (L)). Liver Function Tests: Recent Labs  Lab 06/07/20 0814 06/08/20 0129 06/10/20 0617 06/11/20 0447 06/12/20 0424  AST 22 17  --   --   --   ALT 20 18  --   --   --   ALKPHOS 50 41  --   --   --   BILITOT 0.9 0.6  --   --   --   PROT 5.2* 5.3*  --   --   --   ALBUMIN 1.7* 2.4* 3.5 3.2* 3.0*   No results for input(s): LIPASE, AMYLASE in the last 168 hours. No results for input(s): AMMONIA in the last 168 hours. Coagulation Profile: Recent Labs  Lab 06/06/20 1821 06/08/20 0129  INR 1.4* 1.5*   Cardiac  Enzymes: No results for input(s): CKTOTAL, CKMB, CKMBINDEX, TROPONINI in the last 168 hours. BNP (last 3 results) No results for input(s): PROBNP in the last 8760 hours. HbA1C: No results for input(s): HGBA1C in the last 72 hours. CBG: Recent Labs  Lab 06/12/20 1114 06/12/20 1533 06/12/20 2108 06/13/20 0736 06/13/20 1117  GLUCAP 179* 168* 164* 138* 350*   Lipid Profile: No results for input(s): CHOL, HDL, LDLCALC, TRIG, CHOLHDL, LDLDIRECT in the last 72 hours. Thyroid Function Tests: No results for input(s): TSH, T4TOTAL, FREET4, T3FREE, THYROIDAB in the last 72 hours. Anemia  Panel: No results for input(s): VITAMINB12, FOLATE, FERRITIN, TIBC, IRON, RETICCTPCT in the last 72 hours. Sepsis Labs: No results for input(s): PROCALCITON, LATICACIDVEN in the last 168 hours.  Recent Results (from the past 240 hour(s))  SARS Coronavirus 2 by RT PCR (hospital order, performed in [facility] hospital lab) Nasopharyngeal Nasopharyngeal Swab     Status: None   Collection Time: 06/05/20  9:33 AM   Specimen: Nasopharyngeal Swab  Result Value Ref Range Status   SARS Coronavirus 2 NEGATIVE NEGATIVE Final    Comment: (NOTE) SARS-CoV-2 target nucleic acids are NOT DETECTED. The SARS-CoV-2 RNA is generally detectable in upper and lower respiratory specimens during the acute phase of infection. The lowest concentration of SARS-CoV-2 viral copies this assay can detect is 250 copies / mL. A negative result does not preclude SARS-CoV-2 infection and should not be used as the sole basis for treatment or other patient management decisions.  A negative result may occur with improper specimen collection / handling, submission of specimen other than nasopharyngeal swab, presence of viral mutation(s) within the areas targeted by this assay, and inadequate number of viral copies (<250 copies / mL). A negative result must be combined with clinical observations, patient history, and epidemiological  information. Fact Sheet for Patients:   StrictlyIdeas.no Fact Sheet for Healthcare Providers: BankingDealers.co.za This test is not yet approved or cleared  by the [place] FDA and has been authorized for detection and/or diagnosis of SARS-CoV-2 by FDA under an Emergency Use Authorization (EUA).  This EUA will remain in effect (meaning this test can be used) for the duration of the COVID-19 declaration under Section 564(b)(1) of the Act, 21 U.S.C. section 360bbb-3(b)(1), unless the authorization is terminated or revoked sooner. Performed at [facility], [address] [place], [place] [address]   Blood culture (routine x 2)     Status: None   Collection Time: 06/05/20 10:26 AM   Specimen: BLOOD  Result Value Ref Range Status   Specimen Description   Final    BLOOD Blood Culture results may not be optimal due to an excessive volume of blood received in culture bottles   Special Requests   Final    BOTTLES DRAWN AEROBIC AND ANAEROBIC LEFT ANTECUBITAL   Culture   Final    NO GROWTH 5 DAYS Performed at [facility], [address], [place], [place] [address]    Report Status 06/10/2020 FINAL  Final  Blood culture (routine x 2)     Status: None   Collection Time: 06/05/20 10:26 AM   Specimen: BLOOD  Result Value Ref Range Status   Specimen Description   Final    BLOOD Blood Culture results may not be optimal due to an excessive volume of blood received in culture bottles   Special Requests   Final    BOTTLES DRAWN AEROBIC AND ANAEROBIC RIGHT ANTECUBITAL   Culture   Final    NO GROWTH 5 DAYS Performed at [facility], [address] [place], [place] [address]    Report Status 06/10/2020 FINAL  Final  MRSA PCR Screening     Status: None   Collection Time: 06/05/20  2:42 PM   Specimen: Nasopharyngeal  Result Value Ref Range Status   MRSA by PCR NEGATIVE NEGATIVE Final    Comment:        The  GeneXpert MRSA Assay (FDA approved for NASAL specimens only), is one component of a comprehensive MRSA colonization surveillance program. It is not intended to  diagnose MRSA infection nor to guide or monitor treatment for MRSA infections. Performed at [facility], [address], [place], [place] [address]          Radiology Studies: [facility] Show Low Chest [place] 1 View  Result Date: 06/13/2020 CLINICAL DATA:  Hypoxia per ordering notes. EXAM: PORTABLE CHEST - 1 VIEW COMPARISON:  06/10/2020 FINDINGS: Low lung volumes with persistent left infrahilar consolidation/atelectasis. Increase in small left effusion. No overt edema. Improved aeration at the right lung base with patchy infrahilar opacities. Heart size normal. Right paratracheal opacity probably corresponding to tortuous brachiocephalic vessels as seen on previous CT. Stable right arm PICC line to the cavoatrial junction. No pneumothorax. Anterior vertebral endplate spurring at multiple levels in the lower thoracic spine. IMPRESSION: Low lung volumes with persistent left infrahilar consolidation/atelectasis and small left pleural effusion. Electronically Signed   By: Lucrezia Europe M.D.   On: 06/13/2020 09:57        Scheduled Meds: . aspirin EC  81 mg Oral Daily  . budesonide (PULMICORT) nebulizer solution  0.5 mg Nebulization BID  . Chlorhexidine Gluconate Cloth  6 each Topical Daily  . ferrous sulfate  325 mg Oral BID WC  . folic acid  1 mg Oral Daily  . furosemide  40 mg Oral Daily  . insulin aspart  0-20 Units Subcutaneous TID WC  . insulin aspart  0-5 Units Subcutaneous QHS  . insulin glargine  10 Units Subcutaneous QHS  . ipratropium-albuterol  3 mL Nebulization TID  . methylPREDNISolone (SOLU-MEDROL) injection  20 mg Intravenous Q24H  . multivitamin with minerals  1 tablet Oral Daily  . pantoprazole (PROTONIX) IV  40 mg Intravenous Q12H  . pneumococcal 23 valent vaccine  0.5 mL Intramuscular Tomorrow-1000  . potassium  chloride  10 mEq Oral TID  . sodium chloride flush  10-40 mL Intracatheter Q12H  . spironolactone  100 mg Oral BID  . thiamine injection  100 mg Intravenous Daily   And  . thiamine  100 mg Oral Daily   Continuous Infusions: . sodium chloride Stopped (06/07/20 1809)     LOS: 8 days    Time spent: 35 minutes    Sidney Ace, MD [facility] Pager [phone]  If 7PM-7AM, please contact night-coverage 06/13/2020, 3:28 PM

## 2020-06-13 NOTE — Progress Notes (Signed)
CRITICAL CARE NOTE [name] with acute hypoxia with acute interstitial infiltrates with abd distention and lower ext edema, findings are concerning for progressive liver failure    EVENTS 6/8 admitted for hypoxia 6/9 Dx of liver cirrhosis and ascites, s/p paracentesis Albumin and lasix therapy, DX of SBP, +LGIB 6/10 placed on biPAP, alert and awake 6/11 pending paracentesis today 6/13 overnight had episode of desaturation but recovered 6/14-6/15 tolerating biPAP at night fio2 improving 6/16 hypoxia with PT, no increased WOB     CC  follow up liver failure and resp failure  SUBJECTIVE +pneumionia +liver failure Alert and awake NAD sitting in chair comfortably Wean off high flow [place]   BP (!) 96/52 (BP Location: Left Arm)   Pulse 76   Temp 97.9 F (36.6 C)   Resp 20   Ht 5\' 9"  (1.753 m)   Wt 82.4 kg   SpO2 94%   BMI 26.83 kg/m    I/O last 3 completed shifts: In: 2542 [P.O.:400; I.V.:710; IV Piggyback:100] Out: 4350 [Urine:4350] No intake/output data recorded.  SpO2: 94 % O2 Flow Rate (L/min): 35 L/min FiO2 (%): 51 %  Estimated body mass index is 26.83 kg/m as calculated from the following:   Height as of this encounter: 5\' 9"  (1.753 m).   Weight as of this encounter: 82.4 kg.   Review of Systems:  Gen:  Denies  fever, sweats, chills weight loss  HEENT: Denies blurred vision, double vision, ear pain, eye pain, hearing loss, nose bleeds, sore throat Cardiac:  No dizziness, chest pain or heaviness, chest tightness,edema, No JVD Resp:   No cough, -sputum production, -shortness of breath,-wheezing, -hemoptysis,  Gi: Denies swallowing difficulty, stomach pain, nausea or vomiting, diarrhea, constipation, bowel incontinence Other:  All other systems negative   Physical Examination:   General Appearance: No distress  Neuro:without focal findings,  speech normal,  HEENT: PERRLA, EOM intact.   Pulmonary: normal breath sounds, No wheezing.   CardiovascularNormal S1,S2.  No m/r/g.   Abdomen: Benign, Soft, non-tender. Renal:  No costovertebral tenderness  GU:  Not performed at this time. Endoc: No evident thyromegaly Skin:   warm, no rashes, no ecchymosis  Extremities: normal, no cyanosis, clubbing.+edema PSYCHIATRIC: Mood, affect within normal limits.   ALL OTHER ROS ARE NEGATIVE  MEDICATIONS: I have reviewed all medications and confirmed regimen as documented   CULTURE RESULTS   Recent Results (from the past 240 hour(s))  SARS Coronavirus 2 by RT PCR (hospital order, performed in [facility] hospital lab) Nasopharyngeal Nasopharyngeal Swab     Status: None   Collection Time: 06/05/20  9:33 AM   Specimen: Nasopharyngeal Swab  Result Value Ref Range Status   SARS Coronavirus 2 NEGATIVE NEGATIVE Final    Comment: (NOTE) SARS-CoV-2 target nucleic acids are NOT DETECTED. The SARS-CoV-2 RNA is generally detectable in upper and lower respiratory specimens during the acute phase of infection. The lowest concentration of SARS-CoV-2 viral copies this assay can detect is 250 copies / mL. A negative result does not preclude SARS-CoV-2 infection and should not be used as the sole basis for treatment or other patient management decisions.  A negative result may occur with improper specimen collection / handling, submission of specimen other than nasopharyngeal swab, presence of viral mutation(s) within the areas targeted by this assay, and inadequate number of viral copies (<250 copies / mL). A negative result must be combined with clinical observations, patient history, and epidemiological information. Fact Sheet for Patients:   StrictlyIdeas.no Fact  Sheet for Healthcare Providers: BankingDealers.co.za This test is not yet approved or cleared  by the [place] and has been authorized for detection and/or diagnosis of SARS-CoV-2 by FDA under an Emergency Use  Authorization (EUA).  This EUA will remain in effect (meaning this test can be used) for the duration of the COVID-19 declaration under Section 564(b)(1) of the Act, 21 U.S.C. section 360bbb-3(b)(1), unless the authorization is terminated or revoked sooner. Performed at [facility], [address] [place], [place] [address]   Blood culture (routine x 2)     Status: None   Collection Time: 06/05/20 10:26 AM   Specimen: BLOOD  Result Value Ref Range Status   Specimen Description   Final    BLOOD Blood Culture results may not be optimal due to an excessive volume of blood received in culture bottles   Special Requests   Final    BOTTLES DRAWN AEROBIC AND ANAEROBIC LEFT ANTECUBITAL   Culture   Final    NO GROWTH 5 DAYS Performed at [facility], [address] [place], [place] [address]    Report Status 06/10/2020 FINAL  Final  Blood culture (routine x 2)     Status: None   Collection Time: 06/05/20 10:26 AM   Specimen: BLOOD  Result Value Ref Range Status   Specimen Description   Final    BLOOD Blood Culture results may not be optimal due to an excessive volume of blood received in culture bottles   Special Requests   Final    BOTTLES DRAWN AEROBIC AND ANAEROBIC RIGHT ANTECUBITAL   Culture   Final    NO GROWTH 5 DAYS Performed at [facility], [address] [place], [place] [address]    Report Status 06/10/2020 FINAL  Final  MRSA PCR Screening     Status: None   Collection Time: 06/05/20  2:42 PM   Specimen: Nasopharyngeal  Result Value Ref Range Status   MRSA by PCR NEGATIVE NEGATIVE Final    Comment:        The GeneXpert MRSA Assay (FDA approved for NASAL specimens only), is one component of a comprehensive MRSA colonization surveillance program. It is not intended to diagnose MRSA infection nor to guide or monitor treatment for MRSA infections. Performed at [facility], [address] [place], [place] [address]            IMAGING    DG Chest Port 1 View  Result Date: 06/13/2020 CLINICAL DATA:  Hypoxia per ordering notes. EXAM: PORTABLE CHEST - 1 VIEW COMPARISON:  06/10/2020 FINDINGS: Low lung volumes with persistent left infrahilar consolidation/atelectasis. Increase in small left effusion. No overt edema. Improved aeration at the right lung base with patchy infrahilar opacities. Heart size normal. Right paratracheal opacity probably corresponding to tortuous brachiocephalic vessels as seen on previous CT. Stable right arm PICC line to the cavoatrial junction. No pneumothorax. Anterior vertebral endplate spurring at multiple levels in the lower thoracic spine. IMPRESSION: Low lung volumes with persistent left infrahilar consolidation/atelectasis and small left pleural effusion. Electronically Signed   By: Lucrezia Europe M.D.   On: 06/13/2020 09:57       ASSESSMENT AND PLAN SYNOPSIS  [name] with acute hypoxia with acute interstitial infiltrates with abd distention and lower ext edema, findings are concerning for progressive liver failure, 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 +pneumonia   ACUTE Hypoxic and Hypercapnic Respiratory Failure pneumonia slowly resolving Oxygen as needed BD therapy Continue IV abx  ACUTE DIASTOLIC CARDIAC FAILURE- -oxygen as needed -Lasix as tolerated    Morbid obesity, possible OSA.   Will certainly impact respiratory mechanics  ID Dx of SBP, LE cellulitis and pneumonia continue IV abx  GI-liver failure Follow up GI recs GI PROPHYLAXIS as indicated    DIET--> as tolerated Constipation protocol as indicated  ENDO - will use ICU hypoglycemic\Hyperglycemia protocol if indicated     ELECTROLYTES -follow labs as needed -replace as needed -pharmacy consultation and following   DVT/GI PRX ordered and assessed TRANSFUSIONS AS NEEDED MONITOR FSBS I Assessed the need for Labs I Assessed the need for Foley I Assessed the need for Central Venous  Line Family Discussion when available I Assessed the need for Mobilization I made an Assessment of medications to be adjusted accordingly Safety Risk assessment completed   CASE DISCUSSED IN MULTIDISCIPLINARY ROUNDS WITH ICU TEAM  Prognosis is very poor Follow up palliative care team  Corrin Parker, M.D.  [facility]  Medical Director [place] Director [place] Department

## 2020-06-13 NOTE — Consult Note (Signed)
[place] for Electrolyte Monitoring and Replacement   Recent Labs: Potassium (mmol/L)  Date Value  06/13/2020 3.7   Magnesium (mg/dL)  Date Value  06/13/2020 1.7   Calcium (mg/dL)  Date Value  06/13/2020 7.9 (L)   Albumin (g/dL)  Date Value  06/12/2020 3.0 (L)   Phosphorus (mg/dL)  Date Value  06/13/2020 3.4   Sodium (mmol/L)  Date Value  06/13/2020 135   Corrected Ca: 8.7 mg/dL  Assessment: Patient is a 59 y/o M with medical history including EtOH abuse who is admitted to the ICU with acute respiratory failure, anasarca, hypovolemic hyponatremia. Now with new diagnosis of liver cirrhosis. He is s/p paracentesis on 6/9 and 2.5L was removed.  Diuretics: furosemide 40 mg daily + spironolactone 100 mg BID  Goal of Therapy:  Electrolytes within normal limits  Plan:   Continue PO KCl 10 mEq TID (remains on furosemide 40 mg daily)  2 grams IV magnesium sulfate x 1  Repeat all electrolytes with morning labs.  [place] Resident 06/13/2020 7:00 AM

## 2020-06-14 ENCOUNTER — Inpatient Hospital Stay: Payer: Self-pay

## 2020-06-14 ENCOUNTER — Encounter: Payer: Self-pay | Admitting: Internal Medicine

## 2020-06-14 LAB — RENAL FUNCTION PANEL
Albumin: 3 g/dL — ABNORMAL LOW (ref 3.5–5.0)
Anion gap: 4 — ABNORMAL LOW (ref 5–15)
BUN: 16 mg/dL (ref 6–20)
CO2: 30 mmol/L (ref 22–32)
Calcium: 8 mg/dL — ABNORMAL LOW (ref 8.9–10.3)
Chloride: 102 mmol/L (ref 98–111)
Creatinine, Ser: 0.47 mg/dL — ABNORMAL LOW (ref 0.61–1.24)
GFR calc Af Amer: >60 mL/min (ref >60)
GFR calc non Af Amer: >60 mL/min (ref >60)
Glucose, Bld: 152 mg/dL — ABNORMAL HIGH (ref 70–99)
Phosphorus: 3.9 mg/dL (ref 2.5–4.6)
Potassium: 4.2 mmol/L (ref 3.5–5.1)
Sodium: 136 mmol/L (ref 135–145)

## 2020-06-14 LAB — MAGNESIUM: Magnesium: 1.9 mg/dL (ref 1.7–2.4)

## 2020-06-14 LAB — CBC
HCT: 30 % — ABNORMAL LOW (ref 39.0–52.0)
Hemoglobin: 9.6 g/dL — ABNORMAL LOW (ref 13.0–17.0)
MCH: 29 pg (ref 26.0–34.0)
MCHC: 32 g/dL (ref 30.0–36.0)
MCV: 90.6 fL (ref 80.0–100.0)
Platelets: 268 10*3/uL (ref 150–400)
RBC: 3.31 MIL/uL — ABNORMAL LOW (ref 4.22–5.81)
RDW: 14.4 % (ref 11.5–15.5)
WBC: 14.4 10*3/uL — ABNORMAL HIGH (ref 4.0–10.5)
nRBC: 0 % (ref 0.0–0.2)

## 2020-06-14 LAB — GLUCOSE, CAPILLARY
Glucose-Capillary: 159 mg/dL — ABNORMAL HIGH (ref 70–99)
Glucose-Capillary: 196 mg/dL — ABNORMAL HIGH (ref 70–99)
Glucose-Capillary: 303 mg/dL — ABNORMAL HIGH (ref 70–99)

## 2020-06-14 MED ORDER — ENOXAPARIN SODIUM 40 MG/0.4ML ~~LOC~~ SOLN: 40.0000 mg | SUBCUTANEOUS | Status: DC

## 2020-06-14 NOTE — Progress Notes (Signed)
CRITICAL CARE NOTE [name] with acute hypoxia with acute interstitial infiltrates with abd distention and lower ext edema, findings are concerning for progressive liver failure    EVENTS 6/8 admitted for hypoxia 6/9 Dx of liver cirrhosis and ascites, s/p paracentesis Albumin and lasix therapy, DX of SBP, +LGIB 6/10 placed on biPAP, alert and awake 6/11 pending paracentesis today 6/13 overnight had episode of desaturation but recovered 6/14-6/15 tolerating biPAP at night fio2 improving 6/16 hypoxia with PT, no increased WOB 6/17 remains hypoxia, plan for CT chest assess PE   CC  follow up respiratory failure  SUBJECTIVE Hypoxia No distress Patient is NOT working to breathe   BP 96/64   Pulse 72   Temp 98 F (36.7 C) (Oral)   Resp (!) 25   Ht 5\' 9"  (1.753 m)   Wt 81.1 kg   SpO2 98%   BMI 26.40 kg/m    I/O last 3 completed shifts: In: 720 [P.O.:720] Out: 3400 [Urine:3400] No intake/output data recorded.  SpO2: 98 % O2 Flow Rate (L/min): 25 L/min FiO2 (%): 30 %  Estimated body mass index is 26.4 kg/m as calculated from the following:   Height as of this encounter: 5\' 9"  (1.753 m).   Weight as of this encounter: 81.1 kg.   Review of Systems:  Gen:  Denies  fever, sweats, chills weight loss  HEENT: Denies blurred vision, double vision, ear pain, eye pain, hearing loss, nose bleeds, sore throat Cardiac:  No dizziness, chest pain or heaviness, chest tightness,edema, No JVD Resp:   No cough, -sputum production, -shortness of breath,-wheezing, -hemoptysis,  Gi: Denies swallowing difficulty, stomach pain, nausea or vomiting, diarrhea, constipation, bowel incontinence Gu:  Denies bladder incontinence, burning urine Ext:   Denies Joint pain, stiffness or swelling Skin: Denies  skin rash, easy bruising or bleeding or hives Endoc:  Denies polyuria, polydipsia , polyphagia or weight change Psych:   Denies depression, insomnia or hallucinations  Other:   All other systems negative  Physical Examination:   General Appearance: No distress  Neuro:without focal findings,  speech normal,  HEENT: PERRLA, EOM intact.   Pulmonary: normal breath sounds, No wheezing.  CardiovascularNormal S1,S2.  No m/r/g.   Abdomen: Benign, Soft, non-tender. Renal:  No costovertebral tenderness  GU:  Not performed at this time. Endoc: No evident thyromegaly Skin:   warm, no rashes, no ecchymosis  Extremities: normal, no cyanosis, clubbing. PSYCHIATRIC: Mood, affect within normal limits.   ALL OTHER ROS ARE NEGATIVE   MEDICATIONS: I have reviewed all medications and confirmed regimen as documented   CULTURE RESULTS   Recent Results (from the past 240 hour(s))  SARS Coronavirus 2 by RT PCR (hospital order, performed in [facility] hospital lab) Nasopharyngeal Nasopharyngeal Swab     Status: None   Collection Time: 06/05/20  9:33 AM   Specimen: Nasopharyngeal Swab  Result Value Ref Range Status   SARS Coronavirus 2 NEGATIVE NEGATIVE Final    Comment: (NOTE) SARS-CoV-2 target nucleic acids are NOT DETECTED. The SARS-CoV-2 RNA is generally detectable in upper and lower respiratory specimens during the acute phase of infection. The lowest concentration of SARS-CoV-2 viral copies this assay can detect is 250 copies / mL. A negative result does not preclude SARS-CoV-2 infection and should not be used as the sole basis for treatment or other patient management decisions.  A negative result may occur with improper specimen collection / handling, submission of specimen other than nasopharyngeal swab, presence of viral mutation(s) within the  areas targeted by this assay, and inadequate number of viral copies (<250 copies / mL). A negative result must be combined with clinical observations, patient history, and epidemiological information. Fact Sheet for Patients:   StrictlyIdeas.no Fact Sheet for Healthcare  Providers: BankingDealers.co.za This test is not yet approved or cleared  by the [place] FDA and has been authorized for detection and/or diagnosis of SARS-CoV-2 by FDA under an Emergency Use Authorization (EUA).  This EUA will remain in effect (meaning this test can be used) for the duration of the COVID-19 declaration under Section 564(b)(1) of the Act, 21 U.S.C. section 360bbb-3(b)(1), unless the authorization is terminated or revoked sooner. Performed at [facility]: [address] [place], [place] [address]   Blood culture (routine x 2)     Status: None   Collection Time: 06/05/20 10:26 AM   Specimen: BLOOD  Result Value Ref Range Status   Specimen Description   Final    BLOOD Blood Culture results may not be optimal due to an excessive volume of blood received in culture bottles   Special Requests   Final    BOTTLES DRAWN AEROBIC AND ANAEROBIC LEFT ANTECUBITAL   Culture   Final    NO GROWTH 5 DAYS Performed at [facility], [address] [place], [place] [address]    Report Status 06/10/2020 FINAL  Final  Blood culture (routine x 2)     Status: None   Collection Time: 06/05/20 10:26 AM   Specimen: BLOOD  Result Value Ref Range Status   Specimen Description   Final    BLOOD Blood Culture results may not be optimal due to an excessive volume of blood received in culture bottles   Special Requests   Final    BOTTLES DRAWN AEROBIC AND ANAEROBIC RIGHT ANTECUBITAL   Culture   Final    NO GROWTH 5 DAYS Performed at [facility] [address] [place], [place] [address]    Report Status 06/10/2020 FINAL  Final  MRSA PCR Screening     Status: None   Collection Time: 06/05/20  2:42 PM   Specimen: Nasopharyngeal  Result Value Ref Range Status   MRSA by PCR NEGATIVE NEGATIVE Final    Comment:        The GeneXpert MRSA Assay (FDA approved for NASAL specimens only), is one component of a comprehensive MRSA  colonization surveillance program. It is not intended to diagnose MRSA infection nor to guide or monitor treatment for MRSA infections. Performed at [facility], [address] [place],  [address]           IMAGING    No results found.   Nutrition Status:          Central Line/ continued, requirement due to  Reason to continue [employer] of central venous pressure or other hemodynamic parameters and poor IV access       ASSESSMENT AND PLAN SYNOPSIS  [name] with acute hypoxia with acute interstitial infiltrates with abd distention and lower ext edema, findings are concerning for progressive liver failure, 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 +pneumonia  Severe ACUTE Hypoxic and Hypercapnic Respiratory Failure-wean fio2 as tolerated CT chest ro PE pending  obesity, possible OSA.   Will certainly impact respiratory mechanics   CARDIAC ICU monitoring  ID-Dx of SBP, LE cellulitis and pneumonia continue IV abx -continue IV abx as prescibed -follow up cultures  GI LIVER failure with- hepato pulmonary syndrome GI PROPHYLAXIS as indicated Increase  aldactone today   DIET--> as tolerated Constipation protocol as indicated  ENDO - will use ICU hypoglycemic\Hyperglycemia protocol if indicated     ELECTROLYTES -follow labs as needed -replace as needed -pharmacy consultation and following   DVT/GI PRX ordered and assessed TRANSFUSIONS AS NEEDED MONITOR FSBS I Assessed the need for Labs I Assessed the need for Foley I Assessed the need for Central Venous Line Family Discussion when available I Assessed the need for Mobilization I made an Assessment of medications to be adjusted accordingly Safety Risk assessment completed   CASE DISCUSSED IN MULTIDISCIPLINARY ROUNDS WITH ICU TEAM    prognosis is guarded.      Corrin Parker, M.D.  [facility]  Medical Director [place] Director [facility] Cardio-Pulmonary Department

## 2020-06-14 NOTE — Progress Notes (Addendum)
Daily Progress Note   Patient Name: Collin Byrd       Date: 06/14/2020 DOB: 1961-01-18  Age: 59 y.o. MRN#: [id number] Attending Physician: Sidney Ace, MD Primary Care Physician: Patient, No Pcp Per Admit Date: 06/05/2020  Reason for Consultation/Follow-up: Establishing goals of care  Subjective: Patient is resting bed. He is on high flow cannula. CT angio completed today. He has bilateral PE's. He has had a GIB earlier during the hospitalization. He has been updated by attending team.   We discussed his diagnoses, prognosis, GOC, EOL wishes disposition and options.  A detailed discussion was had today regarding advanced directives.  Concepts specific to code status, artifical feeding and hydration, IV antibiotics and rehospitalization were discussed.  The difference between an aggressive medical intervention path and a comfort care path was discussed.  Values and goals of care important to patient and family were attempted to be elicited.  Discussed limitations of medical interventions to prolong quality of life in some situations and discussed the concept of human mortality.  He states he would not want to be placed on a ventilator. He would not want CPR. He tells me he needs to discuss other care plans with his family. His HPOA's were called at his request while on ascom speaker phone. They are coming to the bedside for a [place] meeting. This was discussed with CCM and with hospitalist.    [place] Girlfriend and grandson is at bedside. His status is discussed. Patient speaks and understands English, but Leone Brand was used as English is his second language. We discussed his diagnoses, prognosis,  and wishes. His family speaks to him and confirms God is in control of everything.  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 He understands he will die in the hospital due to his high O2 requirement. He would like to visit with family prior to transition to  comfort. He would like DNR/DNI at this time stating he would not want CPR if his heart or breathing stops, and he would not want to be placed on a breathing machine.  HPOA's stepped out briefly at the end of the [place] conversation to call other family members.  I completed a MOST form today using interpretor Maritza, and the signed original was placed in the chart. A photocopy was placed in the chart to be scanned into EMR. The patient outlined their wishes for the following treatment decisions:  Cardiopulmonary Resuscitation: Do Not Attempt Resuscitation (DNR/No CPR)  Medical Interventions: Comfort Measures: Keep clean, warm, and dry. Use medication by any route, positioning, wound care, and other measures to relieve pain and suffering. Use oxygen, suction and manual treatment of airway obstruction as needed for comfort. Do not transfer to the hospital unless comfort needs cannot be met in current location.  Antibiotics: No antibiotics (use other measures to relieve symptoms)  IV Fluids: No IV fluids (provide other measures to ensure comfort)  Feeding Tube: No feeding tube         Length of Stay: 9  Current Medications: Scheduled Meds:  . aspirin EC  81 mg Oral Daily  . budesonide (PULMICORT) nebulizer solution  0.5 mg Nebulization BID  . Chlorhexidine Gluconate Cloth  6 each Topical Daily  . ferrous sulfate  325 mg  Oral BID WC  . folic acid  1 mg Oral Daily  . furosemide  40 mg Oral Daily  . insulin aspart  0-20 Units Subcutaneous TID WC  . insulin aspart  0-5 Units Subcutaneous QHS  . insulin glargine  10 Units Subcutaneous QHS  . ipratropium-albuterol  3 mL Nebulization TID  . methylPREDNISolone (SOLU-MEDROL) injection  20 mg Intravenous Q24H  . multivitamin with minerals  1 tablet Oral Daily  . pantoprazole (PROTONIX) IV  40 mg Intravenous Q12H  . pneumococcal 23 valent vaccine  0.5 mL Intramuscular Tomorrow-1000  . potassium chloride  10 mEq Oral TID  . sodium chloride flush  10-40 mL Intracatheter Q12H  . spironolactone  100 mg Oral BID  . thiamine injection  100 mg Intravenous Daily   And  . thiamine  100 mg Oral Daily    Continuous Infusions: . sodium chloride Stopped (06/07/20 1809)    PRN Meds: sodium chloride, acetaminophen **OR** acetaminophen, albuterol, dextromethorphan-guaiFENesin, LORazepam, morphine injection, naphazoline-glycerin, nitroGLYCERIN, ondansetron **OR** ondansetron (ZOFRAN) IV, sodium chloride flush  Physical Exam Pulmonary:     Effort: Pulmonary effort is normal.     Comments: On high flow cannula.  Musculoskeletal:     Right lower leg: Edema present.     Left lower leg: Edema present.  Skin:    General: Skin is warm and dry.  Neurological:     Mental Status: He is alert.             Vital Signs: BP 110/62   Pulse 80   Temp 98 F (36.7 C) (Oral)   Resp (!) 24   Ht 5' 9" (1.753 m)   Wt 81.1 kg   SpO2 94%   BMI 26.40 kg/m  SpO2: SpO2: 94 % O2 Device: O2 Device: High Flow Nasal Cannula O2 Flow Rate: O2 Flow Rate (L/min): 35 L/min  Intake/output summary:   Intake/Output Summary (Last 24 hours) at 06/14/2020 1443 Last data filed at 06/14/2020 1254 Gross per 24 hour  Intake 240 ml  Output 3200 ml  Net -2960 ml   LBM: Last BM Date: 06/13/20 Baseline Weight:  Weight: 98.4 kg Most recent weight: Weight: 81.1 kg       Palliative  Assessment/Data:      Patient Active Problem List   Diagnosis Date Noted  . SBP (spontaneous bacterial peritonitis) (HCC) 06/07/2020  . Hepatic cirrhosis (HCC) 06/07/2020  . Edema leg   . Acute respiratory failure with hypoxia (HCC) 06/05/2020  . Hyponatremia 06/05/2020  . Elevated troponin 06/05/2020  . Leukocytosis 06/05/2020  . Bilateral lower leg cellulitis 06/05/2020  . Chest pain 06/05/2020  . Sepsis (HCC) 06/05/2020  . Alcohol abuse 06/05/2020    Palliative Care Assessment & Plan    Recommendations/Plan:  Comfort care once patient has been able to visit with family.   Code Status:    Code Status Orders  (From admission, onward)         Start     Ordered   06/05/20 1440  Full code  Continuous        06/05/20 1439        Code Status History    This patient has a current code status but no historical code status.   Advance Care Planning Activity    Advance Directive Documentation     Most Recent Value  Type of Advance Directive Living will, Healthcare Power of Attorney  Pre-existing out of facility DNR order (yellow form or pink MOST form) --  "MOST" Form in Place? --       Prognosis:  Hospital death    Thank you for allowing the Palliative Medicine Team to assist in the care of this patient.   Total Time 1:40-2:20 3:00-4:40 40 min  1 hour 40 min Prolonged Time Billed yes      Greater than 50%  of this time was spent counseling and coordinating care related to the above assessment and plan.  Crystal Griffin, NP  Please contact Palliative Medicine Team phone at [phone] for questions and concerns.      

## 2020-06-14 NOTE — Progress Notes (Signed)
PROGRESS NOTE    Collin Byrd  [name] DOB: 01/03/1961 DOA: 06/05/2020 PCP: Patient, No Pcp Per  Brief Narrative:  [name] with acute hypoxia with acute interstitial infiltrates with abd distention and lower ext edema, findings are concerning for progressive liver failure   6/8 admitted for hypoxia 6/9 Dx of liver cirrhosis and ascites, s/p paracentesis Albumin and lasix therapy, DX of SBP, +LGIB 6/10 placed on biPAP, alert and awake 6/11 pending paracentesis today 6/13 overnight had episode of desaturation but recovered 6/14-6/15 tolerating biPAP at night fio2 improving 6/16 hypoxia with PT, no increased WOB  6/17: Patient seen and examined.  Remains markedly hypoxic.  CT PE study pursued.  Positive for bilateral pulmonary emboli.  Heparin infusion deferred at this time given patient's propensity for bleeding.  Consideration for IVC filter.  Case discussed with palliative care.  Family meeting today for consideration of comfort measures and a further goals of care discussion.  Assessment & Plan:   Principal Problem:   Acute respiratory failure with hypoxia (HCC) Active Problems:   Hyponatremia   Elevated troponin   Leukocytosis   Bilateral lower leg cellulitis   Chest pain   Sepsis (HCC)   Alcohol abuse   Edema leg   SBP (spontaneous bacterial peritonitis) (HCC)   Hepatic cirrhosis (HCC)  Acute pulmonary embolism Noted on CT PE study today 06/14/20 Given decompensated cirrhosis and GI bleed will defer heparin GTT at this time Bilateral lower extremity duplex ordered Consideration for IVC filter Likely will not impact overall prognosis Per palliative care plan for goals of care discussion with family at bedside today  Acute hypoxic and hypercapnic respiratory failure Slowly improving Secondary to 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 Continue oxygen as needed, titrate as tolerated Bronchodilator therapy As needed BiPAP Diuresis, Lasix and  spironolactone Dose of spironolactone increased, currently 100 mg twice daily  Acute on chronic diastolic heart failure Lasix as above Oxygen, wean as tolerated   Presumed spontaneous bacterial peritonitis Lower extremity cellulitis Acute community-acquired pneumonia Sepsis secondary to above, improved Completed course of IV antibiotic  Acute liver failure Decompensated cirrhosis with abdominal ascites GI bleed Secondary to alcohol induced liver disease Fortunately this may be progressive Prognosis overall be poor Patient started by palliative care At this time still pursuing full scope of treatment GI no longer following Can consider EGD when/if patient more stable from pulmonary standpoint Completed antibiotics for SBP On IV acid suppression On steroids for acute hepatitis.  Unclear role for this at this time    DVT prophylaxis: SCDs Code Status: Full Family Communication: None today Disposition Plan: Status is: Inpatient  Remains inpatient appropriate because:Inpatient level of care appropriate due to severity of illness   Dispo: The patient is from: Home              Anticipated d/c is to: Home              Anticipated d/c date is: > 3 days              Patient currently is not medically stable to d/c.   Still severely hypoxemic secondary to hepatopulmonary syndrome and newly diagnosed PE.  Attempt to wean down oxygen.  Overall prognosis remains poor.      Consultants:   Pulmonary  Procedures:   None  Antimicrobials: none    Subjective: Seen and examined.  No acute distress.  Remains markedly hypoxic  Objective: Vitals:   06/14/20 0852 06/14/20 1047 06/14/20 1200 06/14/20 1415  BP:  110/62   Pulse: 92 88 80   Resp: (!) 23 (!) 26 (!) 24   Temp:      TempSrc:      SpO2: (S) 90% 98% 93% 94%  Weight:      Height:        Intake/Output Summary (Last 24 hours) at 06/14/2020 1444 Last data filed at 06/14/2020 1254 Gross per 24 hour  Intake  240 ml  Output 3200 ml  Net -2960 ml   Filed Weights   06/12/20 0433 06/13/20 0400 06/14/20 0422  Weight: 84.5 kg 82.4 kg 81.1 kg    Examination:  General exam: Appears calm and comfortable  Respiratory system: Clear to auscultation.  Breath sounds decreased at bases.  Normal work of breathing Cardiovascular system: S1 & S2 heard, RRR. No JVD, murmurs, rubs, gallops or clicks. No pedal edema. Gastrointestinal system: Abdomen is nondistended, soft and nontender. No organomegaly or masses felt. Normal bowel sounds heard. Central nervous system: Alert and oriented. No focal neurological deficits. Extremities: Symmetric 5 x 5 power. Skin: No rashes, lesions or ulcers Psychiatry: Judgement and insight appear normal. Mood & affect appropriate.     Data Reviewed: I have personally reviewed following labs and imaging studies  CBC: Recent Labs  Lab 06/09/20 0440 06/10/20 0617 06/11/20 0447 06/12/20 0424 06/14/20 0419  WBC 11.0* 11.1* 14.5* 11.4* 14.4*  HGB 8.6* 9.1* 9.1* 9.2* 9.6*  HCT 27.3* 28.1* 29.6* 28.5* 30.0*  MCV 91.9 90.1 94.3 89.6 90.6  PLT 331 337 308 302 732   Basic Metabolic Panel: Recent Labs  Lab 06/10/20 0617 06/11/20 0447 06/12/20 0424 06/13/20 0416 06/14/20 0419  NA 136 136 135 135 136  K 2.9* 3.6 4.5 3.7 4.2  CL 83* 99 103 99 102  CO2 45* 30 25 33* 30  GLUCOSE 230* 213* 330* 132* 152*  BUN 18 18 20 19 16   CREATININE 0.59* 0.52* 0.54* 0.54* 0.47*  CALCIUM 7.6* 7.6* 8.1* 7.9* 8.0*  MG 1.5* 2.0 2.0 1.7 1.9  PHOS 3.7 2.9 2.6 3.4 3.9   GFR: Estimated Creatinine Clearance: 101.9 mL/min (A) (by C-G formula based on SCr of 0.47 mg/dL (L)). Liver Function Tests: Recent Labs  Lab 06/08/20 0129 06/10/20 0617 06/11/20 0447 06/12/20 0424 06/14/20 0419  AST 17  --   --   --   --   ALT 18  --   --   --   --   ALKPHOS 41  --   --   --   --   BILITOT 0.6  --   --   --   --   PROT 5.3*  --   --   --   --   ALBUMIN 2.4* 3.5 3.2* 3.0* 3.0*   No results  for input(s): LIPASE, AMYLASE in the last 168 hours. No results for input(s): AMMONIA in the last 168 hours. Coagulation Profile: Recent Labs  Lab 06/08/20 0129  INR 1.5*   Cardiac Enzymes: No results for input(s): CKTOTAL, CKMB, CKMBINDEX, TROPONINI in the last 168 hours. BNP (last 3 results) No results for input(s): PROBNP in the last 8760 hours. HbA1C: No results for input(s): HGBA1C in the last 72 hours. CBG: Recent Labs  Lab 06/13/20 1117 06/13/20 1557 06/13/20 2130 06/14/20 0714 06/14/20 1159  GLUCAP 350* 141* 179* 159* 196*   Lipid Profile: No results for input(s): CHOL, HDL, LDLCALC, TRIG, CHOLHDL, LDLDIRECT in the last 72 hours. Thyroid Function Tests: No results for input(s): TSH, T4TOTAL, FREET4, T3FREE, THYROIDAB in  the last 72 hours. Anemia Panel: No results for input(s): VITAMINB12, FOLATE, FERRITIN, TIBC, IRON, RETICCTPCT in the last 72 hours. Sepsis Labs: No results for input(s): PROCALCITON, LATICACIDVEN in the last 168 hours.  Recent Results (from the past 240 hour(s))  SARS Coronavirus 2 by RT PCR (hospital order, performed in [facility] hospital lab) Nasopharyngeal Nasopharyngeal Swab     Status: None   Collection Time: 06/05/20  9:33 AM   Specimen: Nasopharyngeal Swab  Result Value Ref Range Status   SARS Coronavirus 2 NEGATIVE NEGATIVE Final    Comment: (NOTE) SARS-CoV-2 target nucleic acids are NOT DETECTED. The SARS-CoV-2 RNA is generally detectable in upper and lower respiratory specimens during the acute phase of infection. The lowest concentration of SARS-CoV-2 viral copies this assay can detect is 250 copies / mL. A negative result does not preclude SARS-CoV-2 infection and should not be used as the sole basis for treatment or other patient management decisions.  A negative result may occur with improper specimen collection / handling, submission of specimen other than nasopharyngeal swab, presence of viral mutation(s) within the areas  targeted by this assay, and inadequate number of viral copies (<250 copies / mL). A negative result must be combined with clinical observations, patient history, and epidemiological information. Fact Sheet for Patients:   StrictlyIdeas.no Fact Sheet for Healthcare Providers: BankingDealers.co.za This test is not yet approved or cleared  by the [place] FDA and has been authorized for detection and/or diagnosis of SARS-CoV-2 by FDA under an Emergency Use Authorization (EUA).  This EUA will remain in effect (meaning this test can be used) for the duration of the COVID-19 declaration under Section 564(b)(1) of the Act, 21 U.S.C. section 360bbb-3(b)(1), unless the authorization is terminated or revoked sooner. Performed at [facility], [address] [place], [place] [address]   Blood culture (routine x 2)     Status: None   Collection Time: 06/05/20 10:26 AM   Specimen: BLOOD  Result Value Ref Range Status   Specimen Description   Final    BLOOD Blood Culture results may not be optimal due to an excessive volume of blood received in culture bottles   Special Requests   Final    BOTTLES DRAWN AEROBIC AND ANAEROBIC LEFT ANTECUBITAL   Culture   Final    NO GROWTH 5 DAYS Performed at [facility], [address] [place], [place] [address]    Report Status 06/10/2020 FINAL  Final  Blood culture (routine x 2)     Status: None   Collection Time: 06/05/20 10:26 AM   Specimen: BLOOD  Result Value Ref Range Status   Specimen Description   Final    BLOOD Blood Culture results may not be optimal due to an excessive volume of blood received in culture bottles   Special Requests   Final    BOTTLES DRAWN AEROBIC AND ANAEROBIC RIGHT ANTECUBITAL   Culture   Final    NO GROWTH 5 DAYS Performed at [facility], [address], [place], [place] [address]    Report Status 06/10/2020 FINAL  Final  MRSA PCR Screening      Status: None   Collection Time: 06/05/20  2:42 PM   Specimen: Nasopharyngeal  Result Value Ref Range Status   MRSA by PCR NEGATIVE NEGATIVE Final    Comment:        The GeneXpert MRSA Assay (FDA approved for NASAL specimens only), is one component of a comprehensive MRSA colonization surveillance program.  It is not intended to diagnose MRSA infection nor to guide or monitor treatment for MRSA infections. Performed at [facility], [address] [place], [place] [address]          Radiology Studies: CT ANGIO CHEST PE W OR WO CONTRAST  Addendum Date: 06/14/2020   ADDENDUM REPORT: 06/14/2020 12:18 ADDENDUM: Critical Value/emergent results were called by telephone at the time of interpretation on 06/14/2020 at 11:58 am to provider Flora Lipps , who verbally acknowledged these results. Electronically Signed   By: Ilona Sorrel M.D.   On: 06/14/2020 12:18   Result Date: 06/14/2020 CLINICAL DATA:  Inpatient. Hypoxemia. Severe acute hypoxic and hypercapnic respiratory failure. Abdominal distension. Lower extremity edema. EXAM: CT ANGIOGRAPHY CHEST WITH CONTRAST TECHNIQUE: Multidetector CT imaging of the chest was performed using the standard protocol during bolus administration of intravenous contrast. Multiplanar CT image reconstructions and MIPs were obtained to evaluate the vascular anatomy. CONTRAST:  39mL OMNIPAQUE IOHEXOL 350 MG/ML SOLN COMPARISON:  Chest radiograph from one day prior. 06/06/2020 chest CT angiogram. FINDINGS: Cardiovascular: The study is moderate quality for the evaluation of pulmonary embolism, limited by motion degradation. Acute segmental and subsegmental bilateral pulmonary emboli, for example in right upper lobe on series 5/image 101, right middle lobe on series 5/image 168, right lower lobe on series 5/image 223 medially and left lower lobe on series 5/image 180 medially. No central or saddle pulmonary emboli. Great vessels are normal in course and  caliber. Normal heart size. No significant pericardial fluid/thickening. Right PICC terminates in the right atrium. Left anterior descending coronary atherosclerosis. Mediastinum/Nodes: No discrete thyroid nodules. Unremarkable esophagus. No axillary adenopathy. Mildly enlarged 1.1 cm subcarinal node (series 4/image 40), stable. No new pathologically enlarged mediastinal nodes. Enlarged 1.4 cm right infrahilar node (series 4/image 60), stable. Enlarged 1.2 cm left infrahilar node (series 4/image 153), stable. Lungs/Pleura: No pneumothorax. Small dependent left pleural effusion. No right pleural effusion. Mild paraseptal emphysema. Patchy debris throughout bilateral lower lobe airways. Previously visualized extensive patchy dense consolidation with air bronchograms in bilateral lower lobes has largely resolved in the right lower lobe and is overall improved in the left lower lobe with persistent dense medial basilar left lower lobe consolidation. Component of mild dependent passive atelectasis in the lower lobes bilaterally. Patchy ground-glass opacities throughout the upper lobes and right middle lobe, decreased. No discrete lung masses or significant pulmonary nodules. Upper abdomen: Diffusely irregular liver surface, compatible with cirrhosis. Subcapsular superior splenic infarct is similar in extent and decreased in density (series 4/image 86). Musculoskeletal: No aggressive appearing focal osseous lesions. Moderate thoracic spondylosis. Review of the MIP images confirms the above findings. IMPRESSION: 1. Acute segmental and subsegmental bilateral pulmonary emboli. No central or saddle pulmonary emboli. 2. Improved multilobar pneumonia with persistent dense consolidation in the medial basilar left lower lobe. 3. Small dependent left pleural effusion. 4. Mild bilateral hilar and mediastinal lymphadenopathy is stable, nonspecific, potentially reactive. 5. Cirrhosis. 6. Subcapsular superior splenic infarct is  similar in extent and decreased in density. 7. Aortic Atherosclerosis (ICD10-I70.0) and Emphysema (ICD10-J43.9). Electronically Signed: By: Ilona Sorrel M.D. On: 06/14/2020 12:00   DG Chest Port 1 View  Result Date: 06/13/2020 CLINICAL DATA:  Hypoxia per ordering notes. EXAM: PORTABLE CHEST - 1 VIEW COMPARISON:  06/10/2020 FINDINGS: Low lung volumes with persistent left infrahilar consolidation/atelectasis. Increase in small left effusion. No overt edema. Improved aeration at the right lung base with patchy infrahilar opacities. Heart size normal. Right paratracheal opacity probably corresponding to tortuous brachiocephalic  vessels as seen on previous CT. Stable right arm PICC line to the cavoatrial junction. No pneumothorax. Anterior vertebral endplate spurring at multiple levels in the lower thoracic spine. IMPRESSION: Low lung volumes with persistent left infrahilar consolidation/atelectasis and small left pleural effusion. Electronically Signed   By: Lucrezia Europe M.D.   On: 06/13/2020 09:57        Scheduled Meds:  aspirin EC  81 mg Oral Daily   budesonide (PULMICORT) nebulizer solution  0.5 mg Nebulization BID   Chlorhexidine Gluconate Cloth  6 each Topical Daily   ferrous sulfate  325 mg Oral BID WC   folic acid  1 mg Oral Daily   furosemide  40 mg Oral Daily   insulin aspart  0-20 Units Subcutaneous TID WC   insulin aspart  0-5 Units Subcutaneous QHS   insulin glargine  10 Units Subcutaneous QHS   ipratropium-albuterol  3 mL Nebulization TID   methylPREDNISolone (SOLU-MEDROL) injection  20 mg Intravenous Q24H   multivitamin with minerals  1 tablet Oral Daily   pantoprazole (PROTONIX) IV  40 mg Intravenous Q12H   pneumococcal 23 valent vaccine  0.5 mL Intramuscular Tomorrow-1000   potassium chloride  10 mEq Oral TID   sodium chloride flush  10-40 mL Intracatheter Q12H   spironolactone  100 mg Oral BID   thiamine injection  100 mg Intravenous Daily   And   thiamine   100 mg Oral Daily   Continuous Infusions:  sodium chloride Stopped (06/07/20 1809)     LOS: 9 days    Time spent: 35 minutes    Sidney Ace, MD [facility] Pager [phone]  If 7PM-7AM, please contact night-coverage 06/14/2020, 2:44 PM

## 2020-06-14 NOTE — Progress Notes (Signed)
Crystal with palliative and spanish speaking interpreter at bedside talking with patient and family.

## 2020-06-14 NOTE — Progress Notes (Signed)
Pt found to have bilateral segmental & subsegmental bilateral PE's on CTA earlier today. Palliative Care had discussion with pt and family and plan was to transition to comfort care once all family had visited.  Day shift nurse then reported that following Palliative's conversation, pt had spoken with additional family members and was considering reversing his decision.  Myself and night shift nurse went to bedside, to clarify goals of treatment with pt.  Pt's significant other is at bedside (POA), along with his grandson present via Telephone.  Interpreter was utilized for our conversation.  We reviewed and discussed in great detail his hospital course including Pneumonia, Liver cirrhosis with ascites, anasarca and volume overload, SBP, GI Bleed.  We also discussed the crucial finding of bilateral PE's, and that we are unable to Anticoagulate him due to prior GIB this admission along with cirrhosis.  Given that we are unable to anticoagulate him, there is very real possibility that the PE's could continue to progress which would lead to worsening respiratory failure and potential cardiac arrest.  We discussed his very poor prognosis.  After our discussion, both pt, his significant other, and his grandson confirm that he remain DNR/DNI with no escalation of care beyond what is currently being done.  And that when family has had time to visit, they will transition to [place].    PLAN -DNR/DNI -No escalation of care beyond current measures -Transition to [place] once family has visited     Darel Hong, AGACNP-BC [place] Pager: [phone]

## 2020-06-14 NOTE — Consult Note (Signed)
[facility] for Electrolyte Monitoring and Replacement   Recent Labs: Potassium (mmol/L)  Date Value  06/14/2020 4.2   Magnesium (mg/dL)  Date Value  06/14/2020 1.9   Calcium (mg/dL)  Date Value  06/14/2020 8.0 (L)   Albumin (g/dL)  Date Value  06/14/2020 3.0 (L)   Phosphorus (mg/dL)  Date Value  06/14/2020 3.9   Sodium (mmol/L)  Date Value  06/14/2020 136   Corrected Ca: 8.8 mg/dL  Assessment: Patient is a 59 y/o M with medical history including EtOH abuse who is admitted to the ICU with acute respiratory failure, anasarca, hypovolemic hyponatremia. Now with new diagnosis of liver cirrhosis. He is s/p paracentesis on 6/9 and 2.5L was removed.  Diuretics: furosemide 40 mg daily + spironolactone 100 mg BID + furosemide 40 mg IV x 1 6/16  Goal of Therapy:  Electrolytes within normal limits  Plan:   Continue PO KCl 10 mEq TID (remains on furosemide 40 mg daily)  Repeat all electrolytes with morning labs 6/18 am  Dallie Piles 06/14/2020 7:18 AM

## 2020-06-15 LAB — BASIC METABOLIC PANEL
Anion gap: 7 (ref 5–15)
BUN: 18 mg/dL (ref 6–20)
CO2: 28 mmol/L (ref 22–32)
Calcium: 8.7 mg/dL — ABNORMAL LOW (ref 8.9–10.3)
Chloride: 99 mmol/L (ref 98–111)
Creatinine, Ser: 0.57 mg/dL — ABNORMAL LOW (ref 0.61–1.24)
GFR calc Af Amer: >60 mL/min (ref >60)
GFR calc non Af Amer: >60 mL/min (ref >60)
Glucose, Bld: 190 mg/dL — ABNORMAL HIGH (ref 70–99)
Potassium: 4.5 mmol/L (ref 3.5–5.1)
Sodium: 134 mmol/L — ABNORMAL LOW (ref 135–145)

## 2020-06-15 LAB — GLUCOSE, CAPILLARY
Glucose-Capillary: 169 mg/dL — ABNORMAL HIGH (ref 70–99)
Glucose-Capillary: 234 mg/dL — ABNORMAL HIGH (ref 70–99)
Glucose-Capillary: 344 mg/dL — ABNORMAL HIGH (ref 70–99)
Glucose-Capillary: 394 mg/dL — ABNORMAL HIGH (ref 70–99)

## 2020-06-15 LAB — MAGNESIUM: Magnesium: 1.9 mg/dL (ref 1.7–2.4)

## 2020-06-15 NOTE — Progress Notes (Signed)
Patient grandson and wife addressed me in the hallway outside of patients room. They expressed concerns of confusion from conversations yesterday. They were both under the impression that the patient was going to be made comfort care throughout the evening. Wife had spent the night at the bedside, grandson came to visit this AM and was upset that comfort care initiatives had not been started.  I explained that the patient himself had made comments expressing his want to "continue fighting" and that he was "strong." These comments made the nursing staff feel as though the patient did not completely understand the process of comfort care.  I explained to the grandson and wife that my goal today was to clear all confusion. They left after our conversation to go home to shower and rest. I explained to them I wanted to sit down alone with the patient and allow him to run the conversation and see if he could explain to me his understanding of his medical condition as well as his wishes.  Sitting down with the patient, he instructed me he did not want an interpreter and did not need one as he could understand and speak English well. He was able to correctly state his name, birth date and knew he was at [facility].  He was able to clearly explain to me that he "has blood clots in my lungs, the doctors say they can't treat them because I have a bad liver and if they did I would bleed out." He also stated, "my lungs are also bad, I have a hard time breathing at times and the doctors said there isn't anything more they can do for that either." He said, "I am dying, the doctors have told me that and that there isn't anything more to do." He was also able to explain comfort care, "they will give me medicine for pain so I do not suffer and they will slowly take off my oxygen." He said, "Jesus has said it is my time."  He did seem overwhelmed. He stated to me I am not in any pain and I do not feel as though I am suffering  right now. I told him I was happy about those things and it was my goal to keep him feeling that way. We came to an agreement that he is not ready for full comfort care at this time because he feels good. He understands that he may start experiencing pain or struggle to breathe. At that time he states he will be ready for comfort care.  He would like me to call and speak to his wife and grandson and explain his wished to them.  I spoke with palliative care, Dr. Patsey Berthold as well as Dr. Priscella Mann and updated them on the plan. At this time he will stay SD status, no escalation in care and will continue to monitor daily for the need/want to transition to comfort care.

## 2020-06-15 NOTE — Progress Notes (Signed)
OT Cancellation Note  Patient Details Name: Collin Byrd MRN: [id number] DOB: 1961-10-21   Cancelled Treatment:    Reason Eval/Treat Not Completed: Other (comment). Chart reviewed. Goals of care and consideration for comfort care measures still pending. Spoke with RN regarding possible OT tx and RN stated, "Don't worry about it." Will hold OT tx and continue to follow acutely for appropriateness for therapy intervention at later date/time.   Jeni Salles, MPH, MS, OTR/L ascom [phone] 06/15/20, 2:43 PM

## 2020-06-15 NOTE — Consult Note (Signed)
[facility] for Electrolyte Monitoring and Replacement   Recent Labs: Potassium (mmol/L)  Date Value  06/15/2020 4.5   Magnesium (mg/dL)  Date Value  06/15/2020 1.9   Calcium (mg/dL)  Date Value  06/15/2020 8.7 (L)   Albumin (g/dL)  Date Value  06/14/2020 3.0 (L)   Phosphorus (mg/dL)  Date Value  06/14/2020 3.9   Sodium (mmol/L)  Date Value  06/15/2020 134 (L)   Corrected Ca: 9.5 mg/dL  Assessment: Patient is a 59 y/o M with medical history including EtOH abuse who is admitted to the ICU with acute respiratory failure, anasarca, hypovolemic hyponatremia. Now with new diagnosis of liver cirrhosis. He is s/p paracentesis on 6/9 and 2.5L was removed.  Diuretics: furosemide 40 mg daily + spironolactone 100 mg BID   Goal of Therapy:  Electrolytes within normal limits  Plan:   Potassium has continued to trend up on PO KCl 10 mEq TID (remains on furosemide 40 mg daily): hold oral KCl for now  Repeat all electrolytes with morning labs 6/19 am  Dallie Piles 06/15/2020 7:07 AM

## 2020-06-15 NOTE — Progress Notes (Signed)
PROGRESS NOTE    Collin Byrd  WVP:[id number] DOB: [birth year] DOA: 06/05/2020 PCP: Patient, No Pcp Per  Brief Narrative:  [name] with acute hypoxia with acute interstitial infiltrates with abd distention and lower ext edema, findings are concerning for progressive liver failure   6/8 admitted for hypoxia 6/9 Dx of liver cirrhosis and ascites, s/p paracentesis Albumin and lasix therapy, DX of SBP, +LGIB 6/10 placed on biPAP, alert and awake 6/11 pending paracentesis today 6/13 overnight had episode of desaturation but recovered 6/14-6/15 tolerating biPAP at night fio2 improving 6/16 hypoxia with PT, no increased WOB  6/17: Patient seen and examined.  Remains markedly hypoxic.  CT PE study pursued.  Positive for bilateral pulmonary emboli.  Heparin infusion deferred at this time given patient's propensity for bleeding.  Consideration for IVC filter.  Case discussed with palliative care.  Family meeting today for consideration of comfort measures and a further goals of care discussion.  6/18: Patient seen and examined.  After discussion with palliative care yesterday and include the patient's family the decision was made to proceed with comfort measures.  Overnight there appears to been some confusion from the patient standpoint and exactly what comfort measures entailed.  Patient states that he is still strong and not pain and not suffering however multiple providers including myself and explained to them that his disease process is complex and likely irreversible at this point and he is dependent on a large amount of supplemental oxygen in order to keep him alive.  He expressed understanding.  Bedside nurse also spoke at length with the patient as well as the patient's family to explain the patient's current state and what the likely outcome would be should the patient become even more hypoxic.  RN understanding of current plan of care is to continue current scope of treatment  without escalation and to continue to monitor daily for the need to transition to full comfort measures.  Assessment & Plan:   Principal Problem:   Acute respiratory failure with hypoxia (HCC) Active Problems:   Hyponatremia   Elevated troponin   Leukocytosis   Bilateral lower leg cellulitis   Chest pain   Sepsis (HCC)   Alcohol abuse   Edema leg   SBP (spontaneous bacterial peritonitis) (HCC)   Hepatic cirrhosis (HCC)  Acute pulmonary embolism Noted on CT PE study today 06/14/20 Given decompensated cirrhosis and GI bleed will defer heparin GTT at this time No plans for IVC filter at this time Given the contraindication anticoagulation patient is at high risk for decompensation as he has multiple bilateral pulmonary emboli  Acute hypoxic and hypercapnic respiratory failure Slowly improving Secondary to 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 Continue oxygen as needed, titrate as tolerated Bronchodilator therapy As needed BiPAP Diuresis, Lasix and spironolactone Dose of spironolactone increased, currently 100 mg twice daily  Acute on chronic diastolic heart failure Lasix as above Oxygen, wean as tolerated   Presumed spontaneous bacterial peritonitis Lower extremity cellulitis Acute community-acquired pneumonia Sepsis secondary to above, improved Completed course of IV antibiotic  Acute liver failure Decompensated cirrhosis with abdominal ascites GI bleed Secondary to alcohol induced liver disease Fortunately this may be progressive Prognosis overall be poor Patient started by palliative care At this time still pursuing full scope of treatment GI no longer following No plans for endoscopy as patient is too hypoxic to tolerate procedure    DVT prophylaxis: SCDs Code Status: Full Family Communication: None today Disposition Plan: Status is: Inpatient  Remains inpatient appropriate because:Inpatient level  of care appropriate due to severity of illness   Dispo: The  patient is from: Home              Anticipated d/c is to: Home              Anticipated d/c date is: > 3 days              Patient currently is not medically stable to d/c.  Patient still with severe hypoxic respiratory failure secondary to hepatopulmonary syndrome and acute pulmonary embolism.  Dependent on heated high flow nasal cannula.  We are unable to wean down at this time.  Palliative care involved and patient has been made DNR.  He is not quite ready for full comfort measures yet.  We will continue current scope of treatment without any escalation of care.      Consultants:   Pulmonary  Procedures:   None  Antimicrobials: none    Subjective: Seen and examined.  No acute distress.  Remains markedly hypoxic  Objective: Vitals:   06/15/20 0900 06/15/20 1000 06/15/20 1100 06/15/20 1200  BP:  102/60  112/82  Pulse: 96 89 92 78  Resp: (!) 27 18 20 19   Temp:      TempSrc:      SpO2: (!) 80% 96% 92% 94%  Weight:      Height:        Intake/Output Summary (Last 24 hours) at 06/15/2020 1418 Last data filed at 06/15/2020 1345 Gross per 24 hour  Intake --  Output 2550 ml  Net -2550 ml   Filed Weights   06/13/20 0400 06/14/20 0422 06/15/20 0423  Weight: 82.4 kg 81.1 kg 78.8 kg    Examination:  General exam: Appears calm and comfortable  Respiratory system: Clear to auscultation.  Breath sounds decreased at bases.  Normal work of breathing Cardiovascular system: S1 & S2 heard, RRR. No JVD, murmurs, rubs, gallops or clicks. No pedal edema. Gastrointestinal system: Abdomen is nondistended, soft and nontender. No organomegaly or masses felt. Normal bowel sounds heard. Central nervous system: Alert and oriented. No focal neurological deficits. Extremities: Symmetric 5 x 5 power. Skin: No rashes, lesions or ulcers Psychiatry: Judgement and insight appear normal. Mood & affect appropriate.     Data Reviewed: I have personally reviewed following labs and imaging  studies  CBC: Recent Labs  Lab 06/09/20 0440 06/10/20 0617 06/11/20 0447 06/12/20 0424 06/14/20 0419  WBC 11.0* 11.1* 14.5* 11.4* 14.4*  HGB 8.6* 9.1* 9.1* 9.2* 9.6*  HCT 27.3* 28.1* 29.6* 28.5* 30.0*  MCV 91.9 90.1 94.3 89.6 90.6  PLT 331 337 308 302 341   Basic Metabolic Panel: Recent Labs  Lab 06/10/20 0617 06/10/20 0617 06/11/20 0447 06/12/20 0424 06/13/20 0416 06/14/20 0419 06/15/20 0415  NA 136   < > 136 135 135 136 134*  K 2.9*   < > 3.6 4.5 3.7 4.2 4.5  CL 83*   < > 99 103 99 102 99  CO2 45*   < > 30 25 33* 30 28  GLUCOSE 230*   < > 213* 330* 132* 152* 190*  BUN 18   < > 18 20 19 16 18   CREATININE 0.59*   < > 0.52* 0.54* 0.54* 0.47* 0.57*  CALCIUM 7.6*   < > 7.6* 8.1* 7.9* 8.0* 8.7*  MG 1.5*   < > 2.0 2.0 1.7 1.9 1.9  PHOS 3.7  --  2.9 2.6 3.4 3.9  --    < > =  values in this interval not displayed.   GFR: Estimated Creatinine Clearance: 101.9 mL/min (A) (by C-G formula based on SCr of 0.57 mg/dL (L)). Liver Function Tests: Recent Labs  Lab 06/10/20 0617 06/11/20 0447 06/12/20 0424 06/14/20 0419  ALBUMIN 3.5 3.2* 3.0* 3.0*   No results for input(s): LIPASE, AMYLASE in the last 168 hours. No results for input(s): AMMONIA in the last 168 hours. Coagulation Profile: No results for input(s): INR, PROTIME in the last 168 hours. Cardiac Enzymes: No results for input(s): CKTOTAL, CKMB, CKMBINDEX, TROPONINI in the last 168 hours. BNP (last 3 results) No results for input(s): PROBNP in the last 8760 hours. HbA1C: No results for input(s): HGBA1C in the last 72 hours. CBG: Recent Labs  Lab 06/14/20 0714 06/14/20 1159 06/14/20 2123 06/15/20 0727 06/15/20 1159  GLUCAP 159* 196* 303* 169* 234*   Lipid Profile: No results for input(s): CHOL, HDL, LDLCALC, TRIG, CHOLHDL, LDLDIRECT in the last 72 hours. Thyroid Function Tests: No results for input(s): TSH, T4TOTAL, FREET4, T3FREE, THYROIDAB in the last 72 hours. Anemia Panel: No results for input(s):  VITAMINB12, FOLATE, FERRITIN, TIBC, IRON, RETICCTPCT in the last 72 hours. Sepsis Labs: No results for input(s): PROCALCITON, LATICACIDVEN in the last 168 hours.  Recent Results (from the past 240 hour(s))  MRSA PCR Screening     Status: None   Collection Time: 06/05/20  2:42 PM   Specimen: Nasopharyngeal  Result Value Ref Range Status   MRSA by PCR NEGATIVE NEGATIVE Final    Comment:        The GeneXpert MRSA Assay (FDA approved for NASAL specimens only), is one component of a comprehensive MRSA colonization surveillance program. It is not intended to diagnose MRSA infection nor to guide or monitor treatment for MRSA infections. Performed at [facility], [address], [place], [place] [address]          Radiology Studies: CT ANGIO CHEST PE W OR WO CONTRAST  Addendum Date: 06/14/2020   ADDENDUM REPORT: 06/14/2020 12:18 ADDENDUM: Critical Value/emergent results were called by telephone at the time of interpretation on 06/14/2020 at 11:58 am to provider Flora Lipps , who verbally acknowledged these results. Electronically Signed   By: Ilona Sorrel M.D.   On: 06/14/2020 12:18   Result Date: 06/14/2020 CLINICAL DATA:  Inpatient. Hypoxemia. Severe acute hypoxic and hypercapnic respiratory failure. Abdominal distension. Lower extremity edema. EXAM: CT ANGIOGRAPHY CHEST WITH CONTRAST TECHNIQUE: Multidetector CT imaging of the chest was performed using the standard protocol during bolus administration of intravenous contrast. Multiplanar CT image reconstructions and MIPs were obtained to evaluate the vascular anatomy. CONTRAST:  29mL OMNIPAQUE IOHEXOL 350 MG/ML SOLN COMPARISON:  Chest radiograph from one day prior. 06/06/2020 chest CT angiogram. FINDINGS: Cardiovascular: The study is moderate quality for the evaluation of pulmonary embolism, limited by motion degradation. Acute segmental and subsegmental bilateral pulmonary emboli, for example in right upper lobe on series  5/image 101, right middle lobe on series 5/image 168, right lower lobe on series 5/image 223 medially and left lower lobe on series 5/image 180 medially. No central or saddle pulmonary emboli. Great vessels are normal in course and caliber. Normal heart size. No significant pericardial fluid/thickening. Right PICC terminates in the right atrium. Left anterior descending coronary atherosclerosis. Mediastinum/Nodes: No discrete thyroid nodules. Unremarkable esophagus. No axillary adenopathy. Mildly enlarged 1.1 cm subcarinal node (series 4/image 40), stable. No new pathologically enlarged mediastinal nodes. Enlarged 1.4 cm right infrahilar node (series 4/image 60), stable. Enlarged 1.2 cm left infrahilar node (  series 4/image 153), stable. Lungs/Pleura: No pneumothorax. Small dependent left pleural effusion. No right pleural effusion. Mild paraseptal emphysema. Patchy debris throughout bilateral lower lobe airways. Previously visualized extensive patchy dense consolidation with air bronchograms in bilateral lower lobes has largely resolved in the right lower lobe and is overall improved in the left lower lobe with persistent dense medial basilar left lower lobe consolidation. Component of mild dependent passive atelectasis in the lower lobes bilaterally. Patchy ground-glass opacities throughout the upper lobes and right middle lobe, decreased. No discrete lung masses or significant pulmonary nodules. Upper abdomen: Diffusely irregular liver surface, compatible with cirrhosis. Subcapsular superior splenic infarct is similar in extent and decreased in density (series 4/image 86). Musculoskeletal: No aggressive appearing focal osseous lesions. Moderate thoracic spondylosis. Review of the MIP images confirms the above findings. IMPRESSION: 1. Acute segmental and subsegmental bilateral pulmonary emboli. No central or saddle pulmonary emboli. 2. Improved multilobar pneumonia with persistent dense consolidation in the medial  basilar left lower lobe. 3. Small dependent left pleural effusion. 4. Mild bilateral hilar and mediastinal lymphadenopathy is stable, nonspecific, potentially reactive. 5. Cirrhosis. 6. Subcapsular superior splenic infarct is similar in extent and decreased in density. 7. Aortic Atherosclerosis (ICD10-I70.0) and Emphysema (ICD10-J43.9). Electronically Signed: By: Ilona Sorrel M.D. On: 06/14/2020 12:00        Scheduled Meds:  aspirin EC  81 mg Oral Daily   budesonide (PULMICORT) nebulizer solution  0.5 mg Nebulization BID   Chlorhexidine Gluconate Cloth  6 each Topical Daily   ferrous sulfate  325 mg Oral BID WC   folic acid  1 mg Oral Daily   furosemide  40 mg Oral Daily   insulin aspart  0-20 Units Subcutaneous TID WC   insulin aspart  0-5 Units Subcutaneous QHS   insulin glargine  10 Units Subcutaneous QHS   ipratropium-albuterol  3 mL Nebulization TID   methylPREDNISolone (SOLU-MEDROL) injection  20 mg Intravenous Q24H   multivitamin with minerals  1 tablet Oral Daily   pantoprazole (PROTONIX) IV  40 mg Intravenous Q12H   pneumococcal 23 valent vaccine  0.5 mL Intramuscular Tomorrow-1000   sodium chloride flush  10-40 mL Intracatheter Q12H   spironolactone  100 mg Oral BID   thiamine  100 mg Oral Daily   Continuous Infusions:  sodium chloride Stopped (06/07/20 1809)     LOS: 10 days    Time spent: 35 minutes    Sidney Ace, MD [facility] Pager [phone]  If 7PM-7AM, please contact night-coverage 06/15/2020, 2:18 PM

## 2020-06-15 NOTE — Progress Notes (Signed)
PT Cancellation Note  Patient Details Name: Anselmo Reihl MRN: [id number] DOB: 01/28/1961   Cancelled Treatment:    Reason Eval/Treat Not Completed: Patient at procedure or test/unavailable   Chart reviewed.  Aware of new PE's found yesterday and no treatment planned at this time due to medical concerns with treatments.  Possible transition to comfort care but has not been initiated at this time.  Discussed with primary PT.  Private Chat with MD. Dr. Priscella Mann - Per MD OK for PT with RN approval with primary concern for Hypoxia.  Upon arrival to unit, Respiratory Therapist in with pt.   Will continue as appropraite.   [name] 06/15/2020, 2:15 PM

## 2020-06-15 NOTE — Progress Notes (Signed)
Received report from the shift RN, pt is stable, no signs of distress, denies pain, denies SOB, denies nausea, lungs diminished.    Pt anxious about prognosis, provided emotional support.  Will continue to monitor.

## 2020-06-16 LAB — BASIC METABOLIC PANEL
Anion gap: 8 (ref 5–15)
BUN: 14 mg/dL (ref 6–20)
CO2: 29 mmol/L (ref 22–32)
Calcium: 8.7 mg/dL — ABNORMAL LOW (ref 8.9–10.3)
Chloride: 97 mmol/L — ABNORMAL LOW (ref 98–111)
Creatinine, Ser: 0.57 mg/dL — ABNORMAL LOW (ref 0.61–1.24)
GFR calc Af Amer: >60 mL/min (ref >60)
GFR calc non Af Amer: >60 mL/min (ref >60)
Glucose, Bld: 196 mg/dL — ABNORMAL HIGH (ref 70–99)
Potassium: 4.4 mmol/L (ref 3.5–5.1)
Sodium: 134 mmol/L — ABNORMAL LOW (ref 135–145)

## 2020-06-16 LAB — GLUCOSE, CAPILLARY
Glucose-Capillary: 152 mg/dL — ABNORMAL HIGH (ref 70–99)
Glucose-Capillary: 246 mg/dL — ABNORMAL HIGH (ref 70–99)
Glucose-Capillary: 369 mg/dL — ABNORMAL HIGH (ref 70–99)
Glucose-Capillary: 315 mg/dL — ABNORMAL HIGH (ref 70–99)

## 2020-06-16 MED ORDER — INSULIN GLARGINE 100 UNIT/ML ~~LOC~~ SOLN
14.0000 [IU] | Freq: Every day | SUBCUTANEOUS | Status: DC
  Administered 2020-06-17: 14 [IU] via SUBCUTANEOUS
  Filled 2020-06-16 (×2): qty 0.14

## 2020-06-16 MED ORDER — SPIRONOLACTONE 25 MG PO TABS
150.0000 mg | ORAL_TABLET | Freq: Two times a day (BID) | ORAL | Status: DC
  Administered 2020-06-16 – 2020-06-17 (×2): 150 mg via ORAL
  Filled 2020-06-16 (×2): qty 6

## 2020-06-16 NOTE — Progress Notes (Deleted)
Report called, patient transferred to the floor.  Family with patient, no new complaints.

## 2020-06-16 NOTE — Consult Note (Signed)
[facility] for Electrolyte Monitoring and Replacement   Recent Labs: Potassium (mmol/L)  Date Value  06/16/2020 4.4   Magnesium (mg/dL)  Date Value  06/15/2020 1.9   Calcium (mg/dL)  Date Value  06/16/2020 8.7 (L)   Albumin (g/dL)  Date Value  06/14/2020 3.0 (L)   Phosphorus (mg/dL)  Date Value  06/14/2020 3.9   Sodium (mmol/L)  Date Value  06/16/2020 134 (L)   Corrected Ca: 9.5 mg/dL  Assessment: Patient is a 59 y/o M with medical history including EtOH abuse who is admitted to the ICU with acute respiratory failure, anasarca, hypovolemic hyponatremia. Now with new diagnosis of liver cirrhosis. He is s/p paracentesis on 6/9 and 2.5L was removed.  Diuretics: furosemide 40 mg daily + spironolactone 100 mg BID   Goal of Therapy:  Electrolytes within normal limits  Plan:   Potassium has continued to trend up on PO KCl 10 mEq TID (remains on furosemide 40 mg daily): hold oral KCl for now  Repeat all electrolytes with morning labs 6/20 am  Collin Byrd 06/16/2020 8:24 AM

## 2020-06-16 NOTE — Progress Notes (Signed)
PROGRESS NOTE    Collin Byrd  HQI:[id number] DOB: [birth year] DOA: 06/05/2020 PCP: Patient, No Pcp Per  Brief Narrative:  [name] with acute hypoxia with acute interstitial infiltrates with abd distention and lower ext edema, findings are concerning for progressive liver failure   6/8 admitted for hypoxia 6/9 Dx of liver cirrhosis and ascites, s/p paracentesis Albumin and lasix therapy, DX of SBP, +LGIB 6/10 placed on biPAP, alert and awake 6/11 pending paracentesis today 6/13 overnight had episode of desaturation but recovered 6/14-6/15 tolerating biPAP at night fio2 improving 6/16 hypoxia with PT, no increased WOB  6/17: Patient seen and examined.  Remains markedly hypoxic.  CT PE study pursued.  Positive for bilateral pulmonary emboli.  Heparin infusion deferred at this time given patient's propensity for bleeding.  Consideration for IVC filter.  Case discussed with palliative care.  Family meeting today for consideration of comfort measures and a further goals of care discussion.  6/18: Patient seen and examined.  After discussion with palliative care yesterday and include the patient's family the decision was made to proceed with comfort measures.  Overnight there appears to been some confusion from the patient standpoint and exactly what comfort measures entailed.  Patient states that he is still strong and not pain and not suffering however multiple providers including myself and explained to them that his disease process is complex and likely irreversible at this point and he is dependent on a large amount of supplemental oxygen in order to keep him alive.  He expressed understanding.  Bedside nurse also spoke at length with the patient as well as the patient's family to explain the patient's current state and what the likely outcome would be should the patient become even more hypoxic.  RN understanding of current plan of care is to continue current scope of treatment  without escalation and to continue to monitor daily for the need to transition to full comfort measures.  6/19: Patient seen and examined.  Normal work of breathing.  Remains dependent on heated high flow nasal cannula.  Currently in 35 L of 45%.  Respiratory status has not made much improvement but also has not subsequently worsened.  Current plan is to continue current scope of treatment without escalation.  Still planning to move towards comfort measures.  Assessment & Plan:   Principal Problem:   Acute respiratory failure with hypoxia (HCC) Active Problems:   Hyponatremia   Elevated troponin   Leukocytosis   Bilateral lower leg cellulitis   Chest pain   Sepsis (HCC)   Alcohol abuse   Edema leg   SBP (spontaneous bacterial peritonitis) ([place])   Hepatic cirrhosis (HCC)  Acute pulmonary embolism Noted on CT PE study today 06/14/20 Given decompensated cirrhosis and GI bleed will defer heparin GTT at this time No plans for IVC filter at this time Given the contraindication anticoagulation patient is at high risk for decompensation as he has multiple bilateral pulmonary emboli  Acute hypoxic and hypercapnic respiratory failure Slowly improving Secondary to 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 Continue oxygen as needed, titrate as tolerated Bronchodilator therapy As needed BiPAP Diuresis, Lasix and spironolactone Dose of spironolactone increased again, 150 mg twice daily Stop potassium supplementation Recheck potassium in the morning  Acute on chronic diastolic heart failure Lasix as above Oxygen, wean as tolerated   Presumed spontaneous bacterial peritonitis Lower extremity cellulitis Acute community-acquired pneumonia Sepsis secondary to above, improved Completed course of IV antibiotic  Acute liver failure Decompensated cirrhosis with abdominal ascites GI bleed  Secondary to alcohol induced liver disease Fortunately this may be progressive Prognosis overall be  poor Patient started by palliative care At this time still pursuing full scope of treatment GI no longer following No plans for endoscopy as patient is too hypoxic to tolerate procedure    DVT prophylaxis: SCDs Code Status: Full Family Communication: None today Disposition Plan: Status is: Inpatient  Remains inpatient appropriate because:Inpatient level of care appropriate due to severity of illness   Dispo: The patient is from: Home              Anticipated d/c is to: Home              Anticipated d/c date is: > 3 days              Patient currently is not medically stable to d/c.  Patient still with severe hypoxic respiratory failure secondary to hepatopulmonary syndrome and acute pulmonary embolism.  Dependent on heated high flow nasal cannula.  We are unable to wean down at this time.  Palliative care involved and patient has been made DNR.  He is not quite ready for full comfort measures yet.  We will continue current scope of treatment without any escalation of care.      Consultants:   Pulmonary  Procedures:   None  Antimicrobials: none    Subjective: Seen and examined.  No acute distress.  Remains markedly hypoxic  Objective: Vitals:   06/16/20 0600 06/16/20 0800 06/16/20 1200 06/16/20 1400  BP: (!) 108/59 105/75 106/68 115/66  Pulse: (!) 58 80 68 80  Resp: 13 20 17  (!) 24  Temp:  98.4 F (36.9 C) 98.3 F (36.8 C)   TempSrc:  Oral Oral   SpO2: 96% 91% 98% 94%  Weight:      Height:        Intake/Output Summary (Last 24 hours) at 06/16/2020 1415 Last data filed at 06/16/2020 1400 Gross per 24 hour  Intake --  Output 4125 ml  Net -4125 ml   Filed Weights   06/13/20 0400 06/14/20 0422 06/15/20 0423  Weight: 82.4 kg 81.1 kg 78.8 kg    Examination:  General exam: Appears calm and comfortable  Respiratory system: Clear to auscultation.  Breath sounds decreased at bases.  Normal work of breathing Cardiovascular system: S1 & S2 heard, RRR. No JVD,  murmurs, rubs, gallops or clicks. No pedal edema. Gastrointestinal system: Abdomen is nondistended, soft and nontender. No organomegaly or masses felt. Normal bowel sounds heard. Central nervous system: Alert and oriented. No focal neurological deficits. Extremities: Symmetric 5 x 5 power. Skin: No rashes, lesions or ulcers Psychiatry: Judgement and insight appear normal. Mood & affect appropriate.     Data Reviewed: I have personally reviewed following labs and imaging studies  CBC: Recent Labs  Lab 06/10/20 0617 06/11/20 0447 06/12/20 0424 06/14/20 0419  WBC 11.1* 14.5* 11.4* 14.4*  HGB 9.1* 9.1* 9.2* 9.6*  HCT 28.1* 29.6* 28.5* 30.0*  MCV 90.1 94.3 89.6 90.6  PLT 337 308 302 825   Basic Metabolic Panel: Recent Labs  Lab 06/10/20 0617 06/10/20 0617 06/11/20 0447 06/11/20 0447 06/12/20 0424 06/13/20 0416 06/14/20 0419 06/15/20 0415 06/16/20 0435  NA 136   < > 136   < > 135 135 136 134* 134*  K 2.9*   < > 3.6   < > 4.5 3.7 4.2 4.5 4.4  CL 83*   < > 99   < > 103 99 102 99 97*  CO2 45*   < > 30   < > 25 33* 30 28 29   GLUCOSE 230*   < > 213*   < > 330* 132* 152* 190* 196*  BUN 18   < > 18   < > 20 19 16 18 14   CREATININE 0.59*   < > 0.52*   < > 0.54* 0.54* 0.47* 0.57* 0.57*  CALCIUM 7.6*   < > 7.6*   < > 8.1* 7.9* 8.0* 8.7* 8.7*  MG 1.5*   < > 2.0  --  2.0 1.7 1.9 1.9  --   PHOS 3.7  --  2.9  --  2.6 3.4 3.9  --   --    < > = values in this interval not displayed.   GFR: Estimated Creatinine Clearance: 101.9 mL/min (A) (by C-G formula based on SCr of 0.57 mg/dL (L)). Liver Function Tests: Recent Labs  Lab 06/10/20 0617 06/11/20 0447 06/12/20 0424 06/14/20 0419  ALBUMIN 3.5 3.2* 3.0* 3.0*   No results for input(s): LIPASE, AMYLASE in the last 168 hours. No results for input(s): AMMONIA in the last 168 hours. Coagulation Profile: No results for input(s): INR, PROTIME in the last 168 hours. Cardiac Enzymes: No results for input(s): CKTOTAL, CKMB, CKMBINDEX,  TROPONINI in the last 168 hours. BNP (last 3 results) No results for input(s): PROBNP in the last 8760 hours. HbA1C: No results for input(s): HGBA1C in the last 72 hours. CBG: Recent Labs  Lab 06/15/20 1159 06/15/20 1611 06/15/20 2137 06/16/20 0754 06/16/20 1121  GLUCAP 234* 344* 394* 152* 246*   Lipid Profile: No results for input(s): CHOL, HDL, LDLCALC, TRIG, CHOLHDL, LDLDIRECT in the last 72 hours. Thyroid Function Tests: No results for input(s): TSH, T4TOTAL, FREET4, T3FREE, THYROIDAB in the last 72 hours. Anemia Panel: No results for input(s): VITAMINB12, FOLATE, FERRITIN, TIBC, IRON, RETICCTPCT in the last 72 hours. Sepsis Labs: No results for input(s): PROCALCITON, LATICACIDVEN in the last 168 hours.  No results found for this or any previous visit (from the past 240 hour(s)).       Radiology Studies: No results found.      Scheduled Meds: . aspirin EC  81 mg Oral Daily  . budesonide (PULMICORT) nebulizer solution  0.5 mg Nebulization BID  . Chlorhexidine Gluconate Cloth  6 each Topical Daily  . ferrous sulfate  325 mg Oral BID WC  . folic acid  1 mg Oral Daily  . furosemide  40 mg Oral Daily  . insulin aspart  0-20 Units Subcutaneous TID WC  . insulin aspart  0-5 Units Subcutaneous QHS  . insulin glargine  10 Units Subcutaneous QHS  . ipratropium-albuterol  3 mL Nebulization TID  . methylPREDNISolone (SOLU-MEDROL) injection  20 mg Intravenous Q24H  . multivitamin with minerals  1 tablet Oral Daily  . pantoprazole (PROTONIX) IV  40 mg Intravenous Q12H  . pneumococcal 23 valent vaccine  0.5 mL Intramuscular Tomorrow-1000  . sodium chloride flush  10-40 mL Intracatheter Q12H  . spironolactone  150 mg Oral BID  . thiamine  100 mg Oral Daily   Continuous Infusions: . sodium chloride Stopped (06/07/20 1809)     LOS: 11 days    Time spent: 35 minutes    Sidney Ace, MD [facility] Pager [phone]  If 7PM-7AM, please contact  night-coverage 06/16/2020, 2:15 PM

## 2020-06-17 LAB — BASIC METABOLIC PANEL
Anion gap: 10 (ref 5–15)
BUN: 16 mg/dL (ref 6–20)
CO2: 28 mmol/L (ref 22–32)
Calcium: 9.3 mg/dL (ref 8.9–10.3)
Chloride: 94 mmol/L — ABNORMAL LOW (ref 98–111)
Creatinine, Ser: 0.56 mg/dL — ABNORMAL LOW (ref 0.61–1.24)
GFR calc Af Amer: >60 mL/min (ref >60)
GFR calc non Af Amer: >60 mL/min (ref >60)
Glucose, Bld: 210 mg/dL — ABNORMAL HIGH (ref 70–99)
Potassium: 4.3 mmol/L (ref 3.5–5.1)
Sodium: 132 mmol/L — ABNORMAL LOW (ref 135–145)

## 2020-06-17 LAB — GLUCOSE, CAPILLARY
Glucose-Capillary: 136 mg/dL — ABNORMAL HIGH (ref 70–99)
Glucose-Capillary: 280 mg/dL — ABNORMAL HIGH (ref 70–99)
Glucose-Capillary: 361 mg/dL — ABNORMAL HIGH (ref 70–99)
Glucose-Capillary: 189 mg/dL — ABNORMAL HIGH (ref 70–99)

## 2020-06-17 MED ORDER — INSULIN GLARGINE 100 UNIT/ML ~~LOC~~ SOLN
20.0000 [IU] | Freq: Every day | SUBCUTANEOUS | Status: DC
  Administered 2020-06-17 – 2020-06-19 (×2): 20 [IU] via SUBCUTANEOUS
  Filled 2020-06-17 (×3): qty 0.2

## 2020-06-17 MED ORDER — CHLORHEXIDINE GLUCONATE CLOTH 2 % EX PADS: 6.0000 | MEDICATED_PAD | Freq: Every day | CUTANEOUS | Status: DC

## 2020-06-17 MED ORDER — SPIRONOLACTONE 100 MG PO TABS
200.0000 mg | ORAL_TABLET | Freq: Two times a day (BID) | ORAL | Status: DC
  Administered 2020-06-17 – 2020-06-20 (×7): 200 mg via ORAL
  Filled 2020-06-17: qty 2
  Filled 2020-06-17 (×3): qty 8
  Filled 2020-06-17 (×5): qty 2
  Filled 2020-06-17 (×3): qty 8
  Filled 2020-06-17: qty 2

## 2020-06-17 NOTE — Progress Notes (Signed)
PROGRESS NOTE    Collin Byrd  EHU:[id number] DOB: 1961-07-08 DOA: 06/05/2020 PCP: Patient, No Pcp Per  Brief Narrative:  [name] with acute hypoxia with acute interstitial infiltrates with abd distention and lower ext edema, findings are concerning for progressive liver failure   6/8 admitted for hypoxia 6/9 Dx of liver cirrhosis and ascites, s/p paracentesis Albumin and lasix therapy, DX of SBP, +LGIB 6/10 placed on biPAP, alert and awake 6/11 pending paracentesis today 6/13 overnight had episode of desaturation but recovered 6/14-6/15 tolerating biPAP at night fio2 improving 6/16 hypoxia with PT, no increased WOB  6/17: Patient seen and examined.  Remains markedly hypoxic.  CT PE study pursued.  Positive for bilateral pulmonary emboli.  Heparin infusion deferred at this time given patient's propensity for bleeding.  Consideration for IVC filter.  Case discussed with palliative care.  Family meeting today for consideration of comfort measures and a further goals of care discussion.  6/18: Patient seen and examined.  After discussion with palliative care yesterday and include the patient's family the decision was made to proceed with comfort measures.  Overnight there appears to been some confusion from the patient standpoint and exactly what comfort measures entailed.  Patient states that he is still strong and not pain and not suffering however multiple providers including myself and explained to them that his disease process is complex and likely irreversible at this point and he is dependent on a large amount of supplemental oxygen in order to keep him alive.  He expressed understanding.  Bedside nurse also spoke at length with the patient as well as the patient's family to explain the patient's current state and what the likely outcome would be should the patient become even more hypoxic.  RN understanding of current plan of care is to continue current scope of treatment  without escalation and to continue to monitor daily for the need to transition to full comfort measures.  6/19: Patient seen and examined.  Normal work of breathing.  Remains dependent on heated high flow nasal cannula.  Currently in 35 L of 45%.  Respiratory status has not made much improvement but also has not subsequently worsened.  Current plan is to continue current scope of treatment without escalation.  Still planning to move towards comfort measures.  6/20: Patient seen and examined.  Continues on heated high flow nasal cannula.  Tapered down to 30 L at 45% as of this morning.  I had a discussion with the patient today regarding his overall prognosis.  There still seems to be some lack of understanding in regards to his overall disease process and its poor prognosis.  I tried to explain to him that we had made small adjustments in some of his medications however he was still significantly hypoxic and that removal of the heated high flow nasal cannula would likely result in fairly rapid respiratory decompensation.  I have had this conversation with the patient before as has palliative care and the pulmonary critical care team.  We will continue to communicate with patient to try and make sure our message is clear.  Assessment & Plan:   Principal Problem:   Acute respiratory failure with hypoxia (HCC) Active Problems:   Hyponatremia   Elevated troponin   Leukocytosis   Bilateral lower leg cellulitis   Chest pain   Sepsis (HCC)   Alcohol abuse   Edema leg   SBP (spontaneous bacterial peritonitis) ([place])   Hepatic cirrhosis (HCC)  Acute pulmonary embolism Noted  on CT PE study today 06/14/20 Given decompensated cirrhosis and GI bleed will defer heparin GTT at this time No plans for IVC filter at this time Given the contraindication anticoagulation patient is at high risk for decompensation as he has multiple bilateral pulmonary emboli  Acute hypoxic and hypercapnic respiratory  failure Slowly improving Secondary to 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 Continue oxygen as needed, titrate as tolerated Bronchodilator therapy As needed BiPAP Diuresis, Lasix and spironolactone Increased dose of spironolactone to maximum daily dose, 200 mg twice daily Stop potassium supplementation Recheck potassium in the morning  Acute on chronic diastolic heart failure Lasix as above Oxygen, wean as tolerated   Presumed spontaneous bacterial peritonitis Lower extremity cellulitis Acute community-acquired pneumonia Sepsis secondary to above, improved Completed course of IV antibiotic  Acute liver failure Decompensated cirrhosis with abdominal ascites GI bleed Secondary to alcohol induced liver disease Fortunately this may be progressive Prognosis overall be poor Patient started by palliative care At this time still pursuing full scope of treatment GI no longer following No plans for endoscopy as patient is too hypoxic to tolerate procedure    DVT prophylaxis: SCDs Code Status: Full Family Communication: None today Disposition Plan: Status is: Inpatient  Remains inpatient appropriate because:Inpatient level of care appropriate due to severity of illness   Dispo: The patient is from: Home              Anticipated d/c is to: Home              Anticipated d/c date is: > 3 days              Patient currently is not medically stable to d/c.  Patient still with severe hypoxic respiratory failure secondary to hepatopulmonary syndrome and acute pulmonary embolism.  Dependent on heated high flow nasal cannula.  We are unable to wean down at this time.  Palliative care involved and patient has been made DNR.  He is not quite ready for full comfort measures yet.  We will continue current scope of treatment without any escalation of care.       Consultants:   Pulmonary  Procedures:   None  Antimicrobials: none    Subjective: Seen and examined.  No acute distress.   Remains markedly hypoxic  Objective: Vitals:   06/17/20 0400 06/17/20 0500 06/17/20 0600 06/17/20 0800  BP: 105/69  101/68 108/73  Pulse: 67  67 (!) 102  Resp: 16  13 (!) 23  Temp:    98.9 F (37.2 C)  TempSrc:    Oral  SpO2: 98%  100% (!) 77%  Weight:  77.3 kg    Height:        Intake/Output Summary (Last 24 hours) at 06/17/2020 1155 Last data filed at 06/17/2020 1000 Gross per 24 hour  Intake --  Output 3375 ml  Net -3375 ml   Filed Weights   06/14/20 0422 06/15/20 0423 06/17/20 0500  Weight: 81.1 kg 78.8 kg 77.3 kg    Examination:  General exam: Appears calm and comfortable  Respiratory system: Clear to auscultation.  Breath sounds decreased at bases.  Normal work of breathing Cardiovascular system: S1 & S2 heard, RRR. No JVD, murmurs, rubs, gallops or clicks. No pedal edema. Gastrointestinal system: Abdomen is nondistended, soft and nontender. No organomegaly or masses felt. Normal bowel sounds heard. Central nervous system: Alert and oriented. No focal neurological deficits. Extremities: Symmetric 5 x 5 power. Skin: No rashes, lesions or ulcers Psychiatry: Judgement and insight appear normal. Mood &  affect appropriate.     Data Reviewed: I have personally reviewed following labs and imaging studies  CBC: Recent Labs  Lab 06/11/20 0447 06/12/20 0424 06/14/20 0419  WBC 14.5* 11.4* 14.4*  HGB 9.1* 9.2* 9.6*  HCT 29.6* 28.5* 30.0*  MCV 94.3 89.6 90.6  PLT 308 302 875   Basic Metabolic Panel: Recent Labs  Lab 06/11/20 0447 06/11/20 0447 06/12/20 0424 06/12/20 0424 06/13/20 0416 06/14/20 0419 06/15/20 0415 06/16/20 0435 06/17/20 0445  NA 136   < > 135   < > 135 136 134* 134* 132*  K 3.6   < > 4.5   < > 3.7 4.2 4.5 4.4 4.3  CL 99   < > 103   < > 99 102 99 97* 94*  CO2 30   < > 25   < > 33* 30 28 29 28   GLUCOSE 213*   < > 330*   < > 132* 152* 190* 196* 210*  BUN 18   < > 20   < > 19 16 18 14 16   CREATININE 0.52*   < > 0.54*   < > 0.54* 0.47* 0.57*  0.57* 0.56*  CALCIUM 7.6*   < > 8.1*   < > 7.9* 8.0* 8.7* 8.7* 9.3  MG 2.0  --  2.0  --  1.7 1.9 1.9  --   --   PHOS 2.9  --  2.6  --  3.4 3.9  --   --   --    < > = values in this interval not displayed.   GFR: Estimated Creatinine Clearance: 101.9 mL/min (A) (by C-G formula based on SCr of 0.56 mg/dL (L)). Liver Function Tests: Recent Labs  Lab 06/11/20 0447 06/12/20 0424 06/14/20 0419  ALBUMIN 3.2* 3.0* 3.0*   No results for input(s): LIPASE, AMYLASE in the last 168 hours. No results for input(s): AMMONIA in the last 168 hours. Coagulation Profile: No results for input(s): INR, PROTIME in the last 168 hours. Cardiac Enzymes: No results for input(s): CKTOTAL, CKMB, CKMBINDEX, TROPONINI in the last 168 hours. BNP (last 3 results) No results for input(s): PROBNP in the last 8760 hours. HbA1C: No results for input(s): HGBA1C in the last 72 hours. CBG: Recent Labs  Lab 06/16/20 0754 06/16/20 1121 06/16/20 1712 06/16/20 2144 06/17/20 1119  GLUCAP 152* 246* 369* 315* 280*   Lipid Profile: No results for input(s): CHOL, HDL, LDLCALC, TRIG, CHOLHDL, LDLDIRECT in the last 72 hours. Thyroid Function Tests: No results for input(s): TSH, T4TOTAL, FREET4, T3FREE, THYROIDAB in the last 72 hours. Anemia Panel: No results for input(s): VITAMINB12, FOLATE, FERRITIN, TIBC, IRON, RETICCTPCT in the last 72 hours. Sepsis Labs: No results for input(s): PROCALCITON, LATICACIDVEN in the last 168 hours.  No results found for this or any previous visit (from the past 240 hour(s)).       Radiology Studies: No results found.      Scheduled Meds: . aspirin EC  81 mg Oral Daily  . budesonide (PULMICORT) nebulizer solution  0.5 mg Nebulization BID  . [START ON 06/18/2020] Chlorhexidine Gluconate Cloth  6 each Topical Q0600  . ferrous sulfate  325 mg Oral BID WC  . folic acid  1 mg Oral Daily  . furosemide  40 mg Oral Daily  . insulin aspart  0-20 Units Subcutaneous TID WC  .  insulin aspart  0-5 Units Subcutaneous QHS  . insulin glargine  20 Units Subcutaneous QHS  . ipratropium-albuterol  3 mL Nebulization TID  .  methylPREDNISolone (SOLU-MEDROL) injection  20 mg Intravenous Q24H  . multivitamin with minerals  1 tablet Oral Daily  . pantoprazole (PROTONIX) IV  40 mg Intravenous Q12H  . pneumococcal 23 valent vaccine  0.5 mL Intramuscular Tomorrow-1000  . sodium chloride flush  10-40 mL Intracatheter Q12H  . spironolactone  200 mg Oral BID  . thiamine  100 mg Oral Daily   Continuous Infusions: . sodium chloride Stopped (06/07/20 1809)     LOS: 12 days    Time spent: 35 minutes    Sidney Ace, MD [facility] Pager [phone]  If 7PM-7AM, please contact night-coverage 06/17/2020, 11:55 AM

## 2020-06-17 NOTE — Consult Note (Addendum)
[facility] for Electrolyte Monitoring and Replacement   Recent Labs: Potassium (mmol/L)  Date Value  06/17/2020 4.3   Magnesium (mg/dL)  Date Value  06/15/2020 1.9   Calcium (mg/dL)  Date Value  06/17/2020 9.3   Albumin (g/dL)  Date Value  06/14/2020 3.0 (L)   Phosphorus (mg/dL)  Date Value  06/14/2020 3.9   Sodium (mmol/L)  Date Value  06/17/2020 132 (L)   Corrected Ca: 9.5 mg/dL  Assessment: Patient is a 59 y/o M with medical history including EtOH abuse who is admitted to the ICU with acute respiratory failure, anasarca, hypovolemic hyponatremia. Now with new diagnosis of liver cirrhosis. He is s/p paracentesis on 6/9 and 2.5L was removed.  Diuretics: furosemide 40 mg daily + spironolactone 200 mg BID (max dose) (hepatopulomary syndrome)  Goal of Therapy:  Electrolytes within normal limits  Plan:   Potassium has continued to trend up on PO KCl 10 mEq TID (remains on furosemide 40 mg daily): hold oral KCl for now  Repeat all electrolytes with morning labs 6/21 am  Oswald Hillock 06/17/2020 8:25 AM

## 2020-06-18 LAB — BASIC METABOLIC PANEL
Anion gap: 9 (ref 5–15)
BUN: 20 mg/dL (ref 6–20)
CO2: 29 mmol/L (ref 22–32)
Calcium: 8.8 mg/dL — ABNORMAL LOW (ref 8.9–10.3)
Chloride: 92 mmol/L — ABNORMAL LOW (ref 98–111)
Creatinine, Ser: 0.71 mg/dL (ref 0.61–1.24)
GFR calc Af Amer: >60 mL/min (ref >60)
GFR calc non Af Amer: >60 mL/min (ref >60)
Glucose, Bld: 338 mg/dL — ABNORMAL HIGH (ref 70–99)
Potassium: 4.4 mmol/L (ref 3.5–5.1)
Sodium: 130 mmol/L — ABNORMAL LOW (ref 135–145)

## 2020-06-18 LAB — GLUCOSE, CAPILLARY
Glucose-Capillary: 229 mg/dL — ABNORMAL HIGH (ref 70–99)
Glucose-Capillary: 267 mg/dL — ABNORMAL HIGH (ref 70–99)
Glucose-Capillary: 165 mg/dL — ABNORMAL HIGH (ref 70–99)
Glucose-Capillary: 225 mg/dL — ABNORMAL HIGH (ref 70–99)

## 2020-06-18 LAB — MAGNESIUM: Magnesium: 2 mg/dL (ref 1.7–2.4)

## 2020-06-18 MED ORDER — STERILE WATER FOR INJECTION IJ SOLN
INTRAMUSCULAR | Status: AC
  Administered 2020-06-18: 10 mL
  Filled 2020-06-18: qty 10

## 2020-06-18 MED ORDER — PREDNISONE 20 MG PO TABS
20.0000 mg | ORAL_TABLET | Freq: Every day | ORAL | Status: DC
  Administered 2020-06-19 – 2020-06-20 (×2): 20 mg via ORAL
  Filled 2020-06-18 (×2): qty 1

## 2020-06-18 NOTE — Consult Note (Signed)
[place] for Electrolyte Monitoring and Replacement   Recent Labs: Potassium (mmol/L)  Date Value  06/18/2020 4.4   Magnesium (mg/dL)  Date Value  06/18/2020 2.0   Calcium (mg/dL)  Date Value  06/18/2020 8.8 (L)   Albumin (g/dL)  Date Value  06/14/2020 3.0 (L)   Phosphorus (mg/dL)  Date Value  06/14/2020 3.9   Sodium (mmol/L)  Date Value  06/18/2020 130 (L)   Assessment: Patient is a 59 y/o M with medical history including EtOH abuse who is admitted to the ICU with acute respiratory failure, anasarca, hypovolemic hyponatremia. Now with new diagnosis of liver cirrhosis. He is s/p paracentesis on 6/9 and 2.5L was removed.  Diuretics: furosemide 40 mg daily + spironolactone 200 mg BID (max dose) (hepatopulomary syndrome)  Goal of Therapy:  Electrolytes within normal limits  Plan:  No replacement indicated at this time.  Will follow electrolytes with AM labs.  Gerald Dexter, PharmD 06/18/2020 2:56 PM

## 2020-06-18 NOTE — Progress Notes (Signed)
OT Cancellation Note  Patient Details Name: Collin Byrd MRN: [id number] DOB: October 29, 1961   Cancelled Treatment:    Reason Eval/Treat Not Completed: Other (comment). Chart reviewed. Secure messaged ICU RN regarding pt's status. Pt appropriate for therapy, however just transferred to another unit/room. Will re-attempt at later time as pt is available in new room.   Jeni Salles, MPH, MS, OTR/L ascom [phone] 06/18/20, 2:18 PM

## 2020-06-18 NOTE — Progress Notes (Addendum)
Daily Progress Note   Patient Name: Collin Byrd       Date: 06/18/2020 DOB: [birth year]  Age: 59 y.o. MRN#: [id number] Attending Physician: Sidney Ace, MD Primary Care Physician: Patient, No Pcp Per Admit Date: 06/05/2020  Reason for Consultation/Follow-up: Establishing goals of care  Subjective: Patient is resting bed. He is now on 3 lpm by [place]. We discussed his wishes. He states an idea of moving out of his current room to another room, and going home has already been introduced. I asked if now that he is on simple [place] if he would want to replace the high flow cannula if he needed it to live and he said "no". He states he would like to take the recommended medications, and follow the diet modifications and other life style modifications that are recommended. I asked if he went home and became worse if he would want to return to the hospital. He stated "it wouldn't make no sense." I asked if he went home if he would want to focus on comfort and die at home peacefully. He said "yea, because it wouldn't make no sense to come back."   Spanish interpretor requested to confirm patient's wishes.  Interpretor used to speak with patient.   We discussed his diagnoses, prognosis, GOC, EOL wishes disposition and options.  A detailed discussion was had today regarding advanced directives.  Concepts specific to code status, artifical feeding and hydration, IV antibiotics and rehospitalization were discussed.  The difference between an aggressive medical intervention path and a comfort care path was discussed.  Values and goals of care important to patient and family were attempted to be elicited.  Discussed limitations of medical interventions to prolong quality of life in some situations and  discussed the concept of human mortality.   With interpretor Edgar Frisk, patient states initially he would want to return to the hospital if he declined and needed escalated care to attempt to save his life. He states he wants to do what he can to try to live. He then states he is not sure what to do. He states he will speak to his family. Continued confusion about patient's goals of care.        Length of Stay: 13  Current Medications: Scheduled Meds:  . aspirin EC  81 mg Oral Daily  .  budesonide (PULMICORT) nebulizer solution  0.5 mg Nebulization BID  . Chlorhexidine Gluconate Cloth  6 each Topical Q0600  . ferrous sulfate  325 mg Oral BID WC  . folic acid  1 mg Oral Daily  . furosemide  40 mg Oral Daily  . insulin aspart  0-20 Units Subcutaneous TID WC  . insulin aspart  0-5 Units Subcutaneous QHS  . insulin glargine  20 Units Subcutaneous QHS  . ipratropium-albuterol  3 mL Nebulization TID  . multivitamin with minerals  1 tablet Oral Daily  . pantoprazole (PROTONIX) IV  40 mg Intravenous Q12H  . pneumococcal 23 valent vaccine  0.5 mL Intramuscular Tomorrow-1000  . [START ON 06/19/2020] predniSONE  20 mg Oral Q breakfast  . sodium chloride flush  10-40 mL Intracatheter Q12H  . spironolactone  200 mg Oral BID  . thiamine  100 mg Oral Daily    Continuous Infusions: . sodium chloride Stopped (06/07/20 1809)    PRN Meds: sodium chloride, acetaminophen **OR** acetaminophen, albuterol, dextromethorphan-guaiFENesin, LORazepam, morphine injection, naphazoline-glycerin, nitroGLYCERIN, ondansetron **OR** ondansetron (ZOFRAN) IV, sodium chloride flush  Physical Exam Pulmonary:     Effort: Pulmonary effort is normal.     Comments: On simple [place]. Musculoskeletal:     Right lower leg: Edema present.     Left lower leg: Edema present.  Skin:    General: Skin is warm and dry.  Neurological:     Mental Status: He is alert.             Vital Signs: BP 111/77   Pulse 77   Temp  98.9 F (37.2 C) (Oral)   Resp (!) 22   Ht 5\' 9"  (1.753 m)   Wt 77 kg   SpO2 96%   BMI 25.07 kg/m  SpO2: SpO2: 96 % O2 Device: O2 Device: Nasal Cannula O2 Flow Rate: O2 Flow Rate (L/min): 3 L/min  Intake/output summary:   Intake/Output Summary (Last 24 hours) at 06/18/2020 1019 Last data filed at 06/18/2020 0818 Gross per 24 hour  Intake 510 ml  Output 2300 ml  Net -1790 ml   LBM: Last BM Date: 06/17/20 Baseline Weight: Weight: 98.4 kg Most recent weight: Weight: 77 kg       Palliative Assessment/Data:      Patient Active Problem List   Diagnosis Date Noted  . SBP (spontaneous bacterial peritonitis) ([place]) 06/07/2020  . Hepatic cirrhosis ([place]) 06/07/2020  . Edema leg   . Acute respiratory failure with hypoxia ([place]) 06/05/2020  . Hyponatremia 06/05/2020  . Elevated troponin 06/05/2020  . Leukocytosis 06/05/2020  . Bilateral lower leg cellulitis 06/05/2020  . Chest pain 06/05/2020  . Sepsis ([place]) 06/05/2020  . Alcohol abuse 06/05/2020    Palliative Care Assessment & Plan    Recommendations/Plan:  Multiple conversations with staff members regarding [place]. Interpretors used. Patient is unclear about his goals as they change with the various conversational attempts to clarify.  Code Status:    Code Status Orders  (From admission, onward)         Start     Ordered   06/05/20 1440  Full code  Continuous        06/05/20 1439        Code Status History    This patient has a current code status but no historical code status.   Advance Care Planning Activity    Advance Directive Documentation     Most Recent Value  Type of Advance Directive Living will, Healthcare Power of [place]  Pre-existing out of facility DNR order (yellow form or pink MOST form) --  "MOST" Form in Place? --       Prognosis:  Poor overall   Thank you for allowing the Palliative Medicine Team to assist in the care of this patient.   Total Time 9:45-10:15 10:30-10:50  50  min Prolonged Time Billed no      Greater than 50%  of this time was spent counseling and coordinating care related to the above assessment and plan.  Asencion Gowda, NP  Please contact Palliative Medicine Team phone at [phone] for questions and concerns.

## 2020-06-18 NOTE — Progress Notes (Addendum)
PT Cancellation Note  Patient Details Name: Collin Byrd MRN: [id number] DOB: Oct 23, 1961   Cancelled Treatment:    Reason Eval/Treat Not Completed: Medical issues which prohibited therapy   Pt transferred to floor.  Plan to co-tx with OT for session.  Pt on room air.  Sats 87-89% at rest with talking.  Deferred PT session at this time given sats at rest and anticipate decreasing with activity.  OT remained in room for session.  See OT note for details.  Discussed discharge plan with pt.  He stated he plans to stay in the hospital and seemed to think this is a long term placement.  "The doctor said it was not safe for me to go home."  Discharge plan and long term goals continue to be discussed with medical team.  If pt discharges home, he will benefit from a hospital bed, wheelchair (to be pushed by family not pt) and bedside commode.  He is unable to ambulate in the home given O2 sats.     Patient suffers from respiratory failure and multiple other medical conditions which impairs his/her ability to perform daily activities like toileting, feeding, dressing, grooming, bathing in the home. A cane, walker, crutch will not resolve the patient's issue with performing activities of daily living. A lightweight wheelchair and cushion is required/recommended and will allow patient to safely perform daily activities.   Patient can safely propel the wheelchair in the home or has a caregiver who can provide assistance.     [name] 06/18/2020, 4:42 PM

## 2020-06-18 NOTE — Progress Notes (Signed)
PROGRESS NOTE    Collin Byrd  ACZ:[id number] DOB: Feb 07, 1961 DOA: 06/05/2020 PCP: Patient, No Pcp Per  Brief Narrative:  [name] with acute hypoxia with acute interstitial infiltrates with abd distention and lower ext edema, findings are concerning for progressive liver failure   6/8 admitted for hypoxia 6/9 Dx of liver cirrhosis and ascites, s/p paracentesis Albumin and lasix therapy, DX of SBP, +LGIB 6/10 placed on biPAP, alert and awake 6/11 pending paracentesis today 6/13 overnight had episode of desaturation but recovered 6/14-6/15 tolerating biPAP at night fio2 improving 6/16 hypoxia with PT, no increased WOB  6/17: Patient seen and examined.  Remains markedly hypoxic.  CT PE study pursued.  Positive for bilateral pulmonary emboli.  Heparin infusion deferred at this time given patient's propensity for bleeding.  Consideration for IVC filter.  Case discussed with palliative care.  Family meeting today for consideration of comfort measures and a further goals of care discussion.  6/18: Patient seen and examined.  After discussion with palliative care yesterday and include the patient's family the decision was made to proceed with comfort measures.  Overnight there appears to been some confusion from the patient standpoint and exactly what comfort measures entailed.  Patient states that he is still strong and not pain and not suffering however multiple providers including myself and explained to them that his disease process is complex and likely irreversible at this point and he is dependent on a large amount of supplemental oxygen in order to keep him alive.  He expressed understanding.  Bedside nurse also spoke at length with the patient as well as the patient's family to explain the patient's current state and what the likely outcome would be should the patient become even more hypoxic.  RN understanding of current plan of care is to continue current scope of treatment  without escalation and to continue to monitor daily for the need to transition to full comfort measures.  6/19: Patient seen and examined.  Normal work of breathing.  Remains dependent on heated high flow nasal cannula.  Currently in 35 L of 45%.  Respiratory status has not made much improvement but also has not subsequently worsened.  Current plan is to continue current scope of treatment without escalation.  Still planning to move towards comfort measures.  6/20: Patient seen and examined.  Continues on heated high flow nasal cannula.  Tapered down to 30 L at 45% as of this morning.  I had a discussion with the patient today regarding his overall prognosis.  There still seems to be some lack of understanding in regards to his overall disease process and its poor prognosis.  I tried to explain to him that we had made small adjustments in some of his medications however he was still significantly hypoxic and that removal of the heated high flow nasal cannula would likely result in fairly rapid respiratory decompensation.  I have had this conversation with the patient before as has palliative care and the pulmonary critical care team.  We will continue to communicate with patient to try and make sure our message is clear.  6/21: Patient seen and examined.  Weaned to 2 L nasal cannula.  Clinically appears well.  Still with a significant misunderstanding of long-term versus short-term prognosis.  I explained to him at length that while we may be able to titrate off the oxygen his long-term prognosis considering his chronic medical issues remains very poor.  He expresses understanding however continues to reiterate the same  confusion with discussions with palliative care.  Assessment & Plan:   Principal Problem:   Acute respiratory failure with hypoxia (HCC) Active Problems:   Hyponatremia   Elevated troponin   Leukocytosis   Bilateral lower leg cellulitis   Chest pain   Sepsis (HCC)   Alcohol abuse    Edema leg   SBP (spontaneous bacterial peritonitis) (HCC)   Hepatic cirrhosis (HCC)  Acute pulmonary embolism Noted on CT PE study today 06/14/20 Given decompensated cirrhosis and GI bleed will defer heparin GTT at this time No plans for IVC filter at this time Given the contraindication anticoagulation patient is at high risk for decompensation as he has multiple bilateral pulmonary emboli  Acute hypoxic and hypercapnic respiratory failure Improved substantially, now on 2 L nasal cannula Secondary to 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 and new PE Continue oxygen as needed, titrate as tolerated Bronchodilator therapy As needed BiPAP Diuresis, Lasix and spironolactone Increased dose of spironolactone to maximum daily dose, 200 mg twice daily Stop potassium supplementation Recheck potassium in the morning  Acute on chronic diastolic heart failure Lasix as above Oxygen, wean as tolerated   Presumed spontaneous bacterial peritonitis Lower extremity cellulitis Acute community-acquired pneumonia Sepsis secondary to above, improved Completed course of IV antibiotic  Acute liver failure Decompensated cirrhosis with abdominal ascites GI bleed Secondary to alcohol induced liver disease Fortunately this may be progressive Prognosis overall be poor Patient started by palliative care At this time still pursuing full scope of treatment GI no longer following No plans for endoscopy as patient is too hypoxic to tolerate procedure    DVT prophylaxis: SCDs Code Status: Full Family Communication: None today Disposition Plan: Status is: Inpatient  Remains inpatient appropriate because:Inpatient level of care appropriate due to severity of illness   Dispo: The patient is from: Home              Anticipated d/c is to: Home              Anticipated d/c date is: > 3 days              Patient currently is not medically stable to d/c.  Disposition plan unclear at this time.  Patient's  oxygen status is markedly improved with high-dose diuretic.  However long-term prognosis considering his decompensated cirrhosis, hepatopulmonary syndrome, pulmonary emboli remains quite poor.  Palliative care is following however there continues to be confusion from patient and family standpoint.  We will continue to reiterate.  Disposition plan pending.      Consultants:   Pulmonary  Procedures:   None  Antimicrobials: none    Subjective: Seen and examined.  No acute distress.  Remains markedly hypoxic  Objective: Vitals:   06/18/20 1100 06/18/20 1150 06/18/20 1200 06/18/20 1300  BP:   102/65   Pulse: 87 78 81 95  Resp: 15 18 15  (!) 26  Temp:  98.2 F (36.8 C)    TempSrc:  Oral    SpO2: 99% 98% 99% 97%  Weight:      Height:        Intake/Output Summary (Last 24 hours) at 06/18/2020 1420 Last data filed at 06/18/2020 1351 Gross per 24 hour  Intake 510 ml  Output 2600 ml  Net -2090 ml   Filed Weights   06/15/20 0423 06/17/20 0500 06/18/20 0352  Weight: 78.8 kg 77.3 kg 77 kg    Examination:  General exam: Appears calm and comfortable  Respiratory system: Clear to auscultation.  Breath sounds decreased at  bases.  Normal work of breathing Cardiovascular system: S1 & S2 heard, RRR. No JVD, murmurs, rubs, gallops or clicks. No pedal edema. Gastrointestinal system: Abdomen is nondistended, soft and nontender. No organomegaly or masses felt. Normal bowel sounds heard. Central nervous system: Alert and oriented. No focal neurological deficits. Extremities: Symmetric 5 x 5 power. Skin: No rashes, lesions or ulcers Psychiatry: Judgement and insight appear normal. Mood & affect appropriate.     Data Reviewed: I have personally reviewed following labs and imaging studies  CBC: Recent Labs  Lab 06/12/20 0424 06/14/20 0419  WBC 11.4* 14.4*  HGB 9.2* 9.6*  HCT 28.5* 30.0*  MCV 89.6 90.6  PLT 302 951   Basic Metabolic Panel: Recent Labs  Lab 06/12/20 0424  06/12/20 0424 06/13/20 0416 06/13/20 0416 06/14/20 0419 06/15/20 0415 06/16/20 0435 06/17/20 0445 06/18/20 0340  NA 135   < > 135   < > 136 134* 134* 132* 130*  K 4.5   < > 3.7   < > 4.2 4.5 4.4 4.3 4.4  CL 103   < > 99   < > 102 99 97* 94* 92*  CO2 25   < > 33*   < > 30 28 29 28 29   GLUCOSE 330*   < > 132*   < > 152* 190* 196* 210* 338*  BUN 20   < > 19   < > 16 18 14 16 20   CREATININE 0.54*   < > 0.54*   < > 0.47* 0.57* 0.57* 0.56* 0.71  CALCIUM 8.1*   < > 7.9*   < > 8.0* 8.7* 8.7* 9.3 8.8*  MG 2.0  --  1.7  --  1.9 1.9  --   --  2.0  PHOS 2.6  --  3.4  --  3.9  --   --   --   --    < > = values in this interval not displayed.   GFR: Estimated Creatinine Clearance: 101.9 mL/min (by C-G formula based on SCr of 0.71 mg/dL). Liver Function Tests: Recent Labs  Lab 06/12/20 0424 06/14/20 0419  ALBUMIN 3.0* 3.0*   No results for input(s): LIPASE, AMYLASE in the last 168 hours. No results for input(s): AMMONIA in the last 168 hours. Coagulation Profile: No results for input(s): INR, PROTIME in the last 168 hours. Cardiac Enzymes: No results for input(s): CKTOTAL, CKMB, CKMBINDEX, TROPONINI in the last 168 hours. BNP (last 3 results) No results for input(s): PROBNP in the last 8760 hours. HbA1C: No results for input(s): HGBA1C in the last 72 hours. CBG: Recent Labs  Lab 06/17/20 1119 06/17/20 1546 06/17/20 2133 06/18/20 0745 06/18/20 1129  GLUCAP 280* 361* 189* 229* 267*   Lipid Profile: No results for input(s): CHOL, HDL, LDLCALC, TRIG, CHOLHDL, LDLDIRECT in the last 72 hours. Thyroid Function Tests: No results for input(s): TSH, T4TOTAL, FREET4, T3FREE, THYROIDAB in the last 72 hours. Anemia Panel: No results for input(s): VITAMINB12, FOLATE, FERRITIN, TIBC, IRON, RETICCTPCT in the last 72 hours. Sepsis Labs: No results for input(s): PROCALCITON, LATICACIDVEN in the last 168 hours.  No results found for this or any previous visit (from the past 240 hour(s)).        Radiology Studies: No results found.      Scheduled Meds: . aspirin EC  81 mg Oral Daily  . budesonide (PULMICORT) nebulizer solution  0.5 mg Nebulization BID  . Chlorhexidine Gluconate Cloth  6 each Topical Q0600  . ferrous sulfate  325 mg  Oral BID WC  . folic acid  1 mg Oral Daily  . furosemide  40 mg Oral Daily  . insulin aspart  0-20 Units Subcutaneous TID WC  . insulin aspart  0-5 Units Subcutaneous QHS  . insulin glargine  20 Units Subcutaneous QHS  . ipratropium-albuterol  3 mL Nebulization TID  . multivitamin with minerals  1 tablet Oral Daily  . pantoprazole (PROTONIX) IV  40 mg Intravenous Q12H  . pneumococcal 23 valent vaccine  0.5 mL Intramuscular Tomorrow-1000  . [START ON 06/19/2020] predniSONE  20 mg Oral Q breakfast  . sodium chloride flush  10-40 mL Intracatheter Q12H  . spironolactone  200 mg Oral BID  . thiamine  100 mg Oral Daily   Continuous Infusions: . sodium chloride Stopped (06/07/20 1809)     LOS: 13 days    Time spent: 35 minutes    Sidney Ace, MD [facility] Pager [phone]  If 7PM-7AM, please contact night-coverage 06/18/2020, 2:20 PM

## 2020-06-18 NOTE — Progress Notes (Signed)
Occupational Therapy Treatment Patient Details Name: Collin Byrd MRN: [id number] DOB: [birth year] Today's Date: 06/18/2020    History of present illness 59 y/o M with medical history including EtOH abuse who is admitted to the ICU with acute respiratory failure, anasarca, hypovolemic hyponatremia. Now with new diagnosis of liver cirrhosis. He is s/p paracentesis on 6/9 and 2.5L was removed. He is being treated with antibiotics for SBP, lower leg cellulitis, and pneumonia.   OT comments  Pt seen for OT tx this date, recently transferred from ICU to new room. Pt reports feeling better. On room air. SpO2 89-90% at rest. Pt performed SLR for improved positioning of socks, SpO2 decreasing to 86-87% with effort requiring ~6min to recover. Pt performed bed level grooming task with set up and again SpO2 decreasing to 86-87% with effort. Back up within a couple min to 89-90%. Grandson called during session and requested to speak with RN for an update. RN notified at end of session. Pt/visitor educated in ECS with PLB to support breath recovery. Pt continues to benefit from skilled OT services.    Follow Up Recommendations  Home health OT    Equipment Recommendations  3 in 1 bedside commode    Recommendations for Other Services      Precautions / Restrictions Precautions Precautions: Fall Precaution Comments: High fall, watch vitals Restrictions Weight Bearing Restrictions: No       Mobility Bed Mobility               General bed mobility comments: deferred 2/2 O2 sats  Transfers                 General transfer comment: deferred 2/2 O2 sats    Balance     Sitting balance-Leahy Scale: Good                                     ADL either performed or assessed with clinical judgement   ADL Overall ADL's : Needs assistance/impaired     Grooming: Oral care;Bed level;Set up Grooming Details (indicate cue type and reason): set up to perform, with SpO2  on room air 89-90% down to 86-87% with effort. Improves after with cues for pursed lip breathing                                     Vision Patient Visual Report: No change from baseline     Perception     Praxis      Cognition Arousal/Alertness: Awake/alert Behavior During Therapy: WFL for tasks assessed/performed Overall Cognitive Status: Within Functional Limits for tasks assessed                                          Exercises Other Exercises Other Exercises: Pt/family educated in energy conservation strategies inclufing activity pacing and importance of pursed lip breathing to support breath recovery   Shoulder Instructions       General Comments      Pertinent Vitals/ Pain       Pain Assessment: No/denies pain  Home Living  Prior Functioning/Environment              Frequency  Min 1X/week        Progress Toward Goals  OT Goals(current goals can now be found in the care plan section)  Progress towards OT goals: Progressing toward goals  Acute Rehab OT Goals Patient Stated Goal: To Go Home OT Goal Formulation: With patient Time For Goal Achievement: 06/26/20 Potential to Achieve Goals: Good  Plan Discharge plan remains appropriate    Co-evaluation                 AM-PAC OT "6 Clicks" Daily Activity     Outcome Measure   Help from another person eating meals?: None Help from another person taking care of personal grooming?: None Help from another person toileting, which includes using toliet, bedpan, or urinal?: A Little Help from another person bathing (including washing, rinsing, drying)?: A Little Help from another person to put on and taking off regular upper body clothing?: None Help from another person to put on and taking off regular lower body clothing?: A Little 6 Click Score: 21    End of Session    OT Visit Diagnosis: Other  abnormalities of gait and mobility (R26.89)   Activity Tolerance Patient tolerated treatment well   Patient Left in bed;with call bell/phone within reach;with bed alarm set;with family/visitor present   Nurse Communication Other (comment) (vitals during bed level grooming, requesting water, grandson wants a call from RN for an update)        Time: 7290-2111 OT Time Calculation (min): 18 min  Charges: OT General Charges $OT Visit: 1 Visit OT Treatments $Self Care/Home Management : 8-22 mins  Jeni Salles, MPH, MS, OTR/L ascom [phone] 06/18/20, 5:02 PM

## 2020-06-19 LAB — BASIC METABOLIC PANEL
Anion gap: 9 (ref 5–15)
BUN: 23 mg/dL — ABNORMAL HIGH (ref 6–20)
CO2: 27 mmol/L (ref 22–32)
Calcium: 8.8 mg/dL — ABNORMAL LOW (ref 8.9–10.3)
Chloride: 96 mmol/L — ABNORMAL LOW (ref 98–111)
Creatinine, Ser: 0.79 mg/dL (ref 0.61–1.24)
GFR calc Af Amer: >60 mL/min (ref >60)
GFR calc non Af Amer: >60 mL/min (ref >60)
Glucose, Bld: 330 mg/dL — ABNORMAL HIGH (ref 70–99)
Potassium: 4.5 mmol/L (ref 3.5–5.1)
Sodium: 132 mmol/L — ABNORMAL LOW (ref 135–145)

## 2020-06-19 LAB — GLUCOSE, CAPILLARY
Glucose-Capillary: 237 mg/dL — ABNORMAL HIGH (ref 70–99)
Glucose-Capillary: 325 mg/dL — ABNORMAL HIGH (ref 70–99)
Glucose-Capillary: 334 mg/dL — ABNORMAL HIGH (ref 70–99)

## 2020-06-19 LAB — PHOSPHORUS: Phosphorus: 4.7 mg/dL — ABNORMAL HIGH (ref 2.5–4.6)

## 2020-06-19 LAB — MAGNESIUM: Magnesium: 1.8 mg/dL (ref 1.7–2.4)

## 2020-06-19 MED ORDER — INSULIN GLARGINE 100 UNIT/ML ~~LOC~~ SOLN
25.0000 [IU] | Freq: Every day | SUBCUTANEOUS | Status: DC
  Administered 2020-06-19 – 2020-06-20 (×2): 25 [IU] via SUBCUTANEOUS
  Filled 2020-06-19 (×3): qty 0.25

## 2020-06-19 MED ORDER — INSULIN ASPART 100 UNIT/ML ~~LOC~~ SOLN
3.0000 [IU] | Freq: Three times a day (TID) | SUBCUTANEOUS | Status: DC
  Administered 2020-06-19 – 2020-06-21 (×8): 3 [IU] via SUBCUTANEOUS
  Filled 2020-06-19 (×8): qty 1

## 2020-06-19 MED ORDER — MAGNESIUM SULFATE 2 GM/50ML IV SOLN
2.0000 g | Freq: Once | INTRAVENOUS | Status: AC
  Administered 2020-06-19: 2 g via INTRAVENOUS
  Filled 2020-06-19: qty 50

## 2020-06-19 NOTE — TOC Progression Note (Signed)
Transition of Care [facility]) - Progression Note    Patient Details  Name: Collin Byrd MRN: [id number] Date of Birth: 12/22/1961  [facility] of [facility]) CM/SW Contact  Shelbie Ammons, RN Phone Number: 06/19/2020, 4:15 PM  Clinical Narrative:   RNCM received notification from MD that patient/grandson are now agreeable to go home with Hospice and that grandson Collin Byrd requests all communication go through him. RNCM placed call to grandson to discuss Hospice recommendation and he reports that he does want Hospice services and would like to have [facility]. He reports that he and his fiance will need assistance in caring for patient. He reports that he is currently on his way to patient's employer to find out about any insurance he might have in place. He reports that it would be best for Hospice to contact him either this afternoon or tomorrow after lunch to make arrangements.          Expected Discharge Plan and Services                                                 Social Determinants of Health (SDOH) Interventions    Readmission Risk Interventions No flowsheet data found.

## 2020-06-19 NOTE — Progress Notes (Signed)
Inpatient Diabetes Program Recommendations  AACE/ADA: New Consensus Statement on Inpatient Glycemic Control (2015)  Target Ranges:  Prepandial:   less than 140 mg/dL      Peak postprandial:   less than 180 mg/dL (1-2 hours)      Critically ill patients:  140 - 180 mg/dL   Lab Results  Component Value Date   GLUCAP 237 (H) 06/19/2020   HGBA1C 7.5 (H) 06/06/2020    Review of Glycemic Control Results for YAVIER, SNIDER (MRN [id number]) as of 06/19/2020 09:42  Ref. Range 06/18/2020 07:45 06/18/2020 11:29 06/18/2020 16:37 06/18/2020 21:23 06/19/2020 07:57  Glucose-Capillary Latest Ref Range: 70 - 99 mg/dL 229 (H) 267 (H) 165 (H) 225 (H) 237 (H)   Outpatient Diabetes medications: None Current orders for Inpatient glycemic control:  Lantus 25 units q HS, Novolog 3 units tid with meals Novolog resistant tid with meals Prednisone 20 mg daily Inpatient Diabetes Program Recommendations:    Note steroids now switched to PO.  Anticipate that insulin needs will reduce as steroids reduced.  Unsure if patient will need insulin at d/c?  Based on A1C, it does not seem that he will. Agree with current orders.  Will follow.   Thanks,  Adah Perl, RN, BC-ADM Inpatient Diabetes Coordinator Pager [phone] (8a-5p)

## 2020-06-19 NOTE — Consult Note (Signed)
[place] for Electrolyte Monitoring and Replacement   Recent Labs: Potassium (mmol/L)  Date Value  06/19/2020 4.5   Magnesium (mg/dL)  Date Value  06/19/2020 1.8   Calcium (mg/dL)  Date Value  06/19/2020 8.8 (L)   Albumin (g/dL)  Date Value  06/14/2020 3.0 (L)   Phosphorus (mg/dL)  Date Value  06/19/2020 4.7 (H)   Sodium (mmol/L)  Date Value  06/19/2020 132 (L)   Assessment: Patient is a 58 y/o M with medical history including EtOH abuse who is admitted to the ICU with acute respiratory failure, anasarca, hypovolemic hyponatremia. Now with new diagnosis of liver cirrhosis. He is s/p paracentesis on 6/9 and 2.5L was removed.  Diuretics: furosemide 40 mg daily + spironolactone 200 mg BID (max dose) (hepatopulomary syndrome)  Goal of Therapy:  Electrolytes within normal limits  Plan:  Mag 1.8 - Will give magnesium sulfate 2 g IV x 1 dose. Will follow electrolytes with AM labs.  Gerald Dexter, PharmD 06/19/2020 7:44 AM

## 2020-06-19 NOTE — Progress Notes (Signed)
PROGRESS NOTE    Collin Byrd  YHC:[id number] DOB: 1961/01/04 DOA: 06/05/2020 PCP: Patient, No Pcp Per  Brief Narrative:  [name] with acute hypoxia with acute interstitial infiltrates with abd distention and lower ext edema, findings are concerning for progressive liver failure   6/8 admitted for hypoxia 6/9 Dx of liver cirrhosis and ascites, s/p paracentesis Albumin and lasix therapy, DX of SBP, +LGIB 6/10 placed on biPAP, alert and awake 6/11 pending paracentesis today 6/13 overnight had episode of desaturation but recovered 6/14-6/15 tolerating biPAP at night fio2 improving 6/16 hypoxia with PT, no increased WOB  6/17: Patient seen and examined.  Remains markedly hypoxic.  CT PE study pursued.  Positive for bilateral pulmonary emboli.  Heparin infusion deferred at this time given patient's propensity for bleeding.  Consideration for IVC filter.  Case discussed with palliative care.  Family meeting today for consideration of comfort measures and a further goals of care discussion.  6/18: Patient seen and examined.  After discussion with palliative care yesterday and include the patient's family the decision was made to proceed with comfort measures.  Overnight there appears to been some confusion from the patient standpoint and exactly what comfort measures entailed.  Patient states that he is still strong and not pain and not suffering however multiple providers including myself and explained to them that his disease process is complex and likely irreversible at this point and he is dependent on a large amount of supplemental oxygen in order to keep him alive.  He expressed understanding.  Bedside nurse also spoke at length with the patient as well as the patient's family to explain the patient's current state and what the likely outcome would be should the patient become even more hypoxic.  RN understanding of current plan of care is to continue current scope of treatment  without escalation and to continue to monitor daily for the need to transition to full comfort measures.  6/19: Patient seen and examined.  Normal work of breathing.  Remains dependent on heated high flow nasal cannula.  Currently in 35 L of 45%.  Respiratory status has not made much improvement but also has not subsequently worsened.  Current plan is to continue current scope of treatment without escalation.  Still planning to move towards comfort measures.  6/20: Patient seen and examined.  Continues on heated high flow nasal cannula.  Tapered down to 30 L at 45% as of this morning.  I had a discussion with the patient today regarding his overall prognosis.  There still seems to be some lack of understanding in regards to his overall disease process and its poor prognosis.  I tried to explain to him that we had made small adjustments in some of his medications however he was still significantly hypoxic and that removal of the heated high flow nasal cannula would likely result in fairly rapid respiratory decompensation.  I have had this conversation with the patient before as has palliative care and the pulmonary critical care team.  We will continue to communicate with patient to try and make sure our message is clear.  6/21: Patient seen and examined.  Weaned to 2 L nasal cannula.  Clinically appears well.  Still with a significant misunderstanding of long-term versus short-term prognosis.  I explained to him at length that while we may be able to titrate off the oxygen his long-term prognosis considering his chronic medical issues remains very poor.  He expresses understanding however continues to reiterate the same  confusion with discussions with palliative care.   6/22: Patient seen and examined.  At rest he is on room air at this time.  Oxygen has been titrated off.  I had a lengthy discussion with the patient today regarding the difference between his short-term improvement in his long-term  prognosis.  And he expresses understanding however have my doubts that there is full comprehension of the grave nature of his condition.  I explained that very concerned that his oxygen status is tenuous and he has multiple pulmonary emboli that are not amenable to treatment and that his chance of decompensation if you should go home would be very very high.  At this time I left the room with plans to return to meet with the patient's grandson as well as the palliative care nurse practitioner she is available early this afternoon.  Assessment & Plan:   Principal Problem:   Acute respiratory failure with hypoxia (HCC) Active Problems:   Hyponatremia   Elevated troponin   Leukocytosis   Bilateral lower leg cellulitis   Chest pain   Sepsis (HCC)   Alcohol abuse   Edema leg   SBP (spontaneous bacterial peritonitis) ([place])   Hepatic cirrhosis (HCC)  Acute pulmonary embolism Noted on CT PE study today 06/14/20 Given decompensated cirrhosis and GI bleed will defer heparin GTT at this time No plans for IVC filter at this time Given the contraindication anticoagulation patient is at high risk for decompensation as he has multiple bilateral pulmonary emboli  Acute hypoxic and hypercapnic respiratory failure Improved substantially, now on 2 L nasal cannula Secondary to 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 and new PE Continue oxygen as needed, titrate as tolerated Bronchodilator therapy As needed BiPAP Diuresis, Lasix and spironolactone Continue spironolactone high-dose for now Can likely start to decrease dose soon Disposition plan is up in the air at this point.  Should patient go home he will need to engage hospice services as his condition is essentially terminal and is chance of decompensation after discharge is very very high.  Will reiterate these concerns when family comes for meeting.  Acute on chronic diastolic heart failure Lasix as above Oxygen, wean as tolerated   Presumed  spontaneous bacterial peritonitis Lower extremity cellulitis Acute community-acquired pneumonia Sepsis secondary to above, improved Completed course of IV antibiotic  Acute liver failure Decompensated cirrhosis with abdominal ascites GI bleed Secondary to alcohol induced liver disease Fortunately this may be progressive Prognosis overall be poor Patient seen by palliative care will recommend hospice at home at time of discharge    DVT prophylaxis: SCDs Code Status: Full Family Communication: None today Disposition Plan: Status is: Inpatient  Remains inpatient appropriate because:Inpatient level of care appropriate due to severity of illness   Dispo: The patient is from: Home              Anticipated d/c is to: Home              Anticipated d/c date is: > 3 days              Patient currently is not medically stable to d/c.  Disposition plan unclear at this time.  Patient's oxygen status is markedly improved with high-dose diuretic.  However long-term prognosis considering his decompensated cirrhosis, hepatopulmonary syndrome, pulmonary emboli remains quite poor.  Palliative care is following however there continues to be confusion from patient and family standpoint.  We will continue to reiterate.  Disposition plan pending.  Family meeting planned for today  Consultants:  Pulmonary  Procedures:   None  Antimicrobials: none    Subjective: Seen and examined.  No acute distress.  No longer hypoxic  Objective: Vitals:   06/19/20 0758 06/19/20 1100 06/19/20 1143 06/19/20 1210  BP: 95/64 127/75 103/68 102/61  Pulse: 96 97 (!) 108 91  Resp: 16 20  16   Temp: 97.9 F (36.6 C)   98.4 F (36.9 C)  TempSrc:    Oral  SpO2: 93% 94% 98% 93%  Weight:      Height:        Intake/Output Summary (Last 24 hours) at 06/19/2020 1248 Last data filed at 06/18/2020 1351 Gross per 24 hour  Intake --  Output 800 ml  Net -800 ml   Filed Weights   06/17/20 0500 06/18/20 0352  06/18/20 1425  Weight: 77.3 kg 77 kg 77.7 kg    Examination:  General exam: Appears calm and comfortable  Respiratory system: Clear to auscultation.  Breath sounds decreased at bases.  Normal work of breathing Cardiovascular system: S1 & S2 heard, RRR. No JVD, murmurs, rubs, gallops or clicks. No pedal edema. Gastrointestinal system: Abdomen is nondistended, soft and nontender. No organomegaly or masses felt. Normal bowel sounds heard. Central nervous system: Alert and oriented. No focal neurological deficits. Extremities: Symmetric 5 x 5 power. Skin: No rashes, lesions or ulcers Psychiatry: Judgement and insight appear normal. Mood & affect appropriate.     Data Reviewed: I have personally reviewed following labs and imaging studies  CBC: Recent Labs  Lab 06/14/20 0419  WBC 14.4*  HGB 9.6*  HCT 30.0*  MCV 90.6  PLT 588   Basic Metabolic Panel: Recent Labs  Lab 06/13/20 0416 06/13/20 0416 06/14/20 0419 06/14/20 0419 06/15/20 0415 06/16/20 0435 06/17/20 0445 06/18/20 0340 06/19/20 0524  NA 135   < > 136   < > 134* 134* 132* 130* 132*  K 3.7   < > 4.2   < > 4.5 4.4 4.3 4.4 4.5  CL 99   < > 102   < > 99 97* 94* 92* 96*  CO2 33*   < > 30   < > 28 29 28 29 27   GLUCOSE 132*   < > 152*   < > 190* 196* 210* 338* 330*  BUN 19   < > 16   < > 18 14 16 20  23*  CREATININE 0.54*   < > 0.47*   < > 0.57* 0.57* 0.56* 0.71 0.79  CALCIUM 7.9*   < > 8.0*   < > 8.7* 8.7* 9.3 8.8* 8.8*  MG 1.7  --  1.9  --  1.9  --   --  2.0 1.8  PHOS 3.4  --  3.9  --   --   --   --   --  4.7*   < > = values in this interval not displayed.   GFR: Estimated Creatinine Clearance: 101.9 mL/min (by C-G formula based on SCr of 0.79 mg/dL). Liver Function Tests: Recent Labs  Lab 06/14/20 0419  ALBUMIN 3.0*   No results for input(s): LIPASE, AMYLASE in the last 168 hours. No results for input(s): AMMONIA in the last 168 hours. Coagulation Profile: No results for input(s): INR, PROTIME in the last  168 hours. Cardiac Enzymes: No results for input(s): CKTOTAL, CKMB, CKMBINDEX, TROPONINI in the last 168 hours. BNP (last 3 results) No results for input(s): PROBNP in the last 8760 hours. HbA1C: No results for input(s): HGBA1C in the last 72 hours. CBG:  Recent Labs  Lab 06/18/20 1129 06/18/20 1637 06/18/20 2123 06/19/20 0757 06/19/20 1135  GLUCAP 267* 165* 225* 237* 325*   Lipid Profile: No results for input(s): CHOL, HDL, LDLCALC, TRIG, CHOLHDL, LDLDIRECT in the last 72 hours. Thyroid Function Tests: No results for input(s): TSH, T4TOTAL, FREET4, T3FREE, THYROIDAB in the last 72 hours. Anemia Panel: No results for input(s): VITAMINB12, FOLATE, FERRITIN, TIBC, IRON, RETICCTPCT in the last 72 hours. Sepsis Labs: No results for input(s): PROCALCITON, LATICACIDVEN in the last 168 hours.  No results found for this or any previous visit (from the past 240 hour(s)).       Radiology Studies: No results found.      Scheduled Meds: . aspirin EC  81 mg Oral Daily  . budesonide (PULMICORT) nebulizer solution  0.5 mg Nebulization BID  . Chlorhexidine Gluconate Cloth  6 each Topical Q0600  . ferrous sulfate  325 mg Oral BID WC  . folic acid  1 mg Oral Daily  . furosemide  40 mg Oral Daily  . insulin aspart  0-20 Units Subcutaneous TID WC  . insulin aspart  0-5 Units Subcutaneous QHS  . insulin aspart  3 Units Subcutaneous TID WC  . insulin glargine  25 Units Subcutaneous QHS  . ipratropium-albuterol  3 mL Nebulization TID  . multivitamin with minerals  1 tablet Oral Daily  . pantoprazole (PROTONIX) IV  40 mg Intravenous Q12H  . pneumococcal 23 valent vaccine  0.5 mL Intramuscular Tomorrow-1000  . predniSONE  20 mg Oral Q breakfast  . sodium chloride flush  10-40 mL Intracatheter Q12H  . spironolactone  200 mg Oral BID  . thiamine  100 mg Oral Daily   Continuous Infusions: . sodium chloride Stopped (06/07/20 1809)     LOS: 14 days    Time spent: 35  minutes    Sidney Ace, MD [facility] Pager [phone]  If 7PM-7AM, please contact night-coverage 06/19/2020, 12:48 PM

## 2020-06-19 NOTE — Progress Notes (Signed)
[place] visited pt. per verbal referral from palliative care yesterday and as follow-up from visits on ICU two weeks ago.  Pt. in bed watching TV breathing room air.  Pt. seemed grateful to see [facility] and asked where Berkley had been the last few days.  Pt. says he is feeling better today; pt. shared that MD told him he could go home but that if 'things got serious' he would not be able to come back to the hospital; pt. did not share which provider told him this.  Pt. expresses a desire to live; he says he is not afraid to die, per se, but does not want to die so young.  Pt. acknowledges that medical team have told him that he must 'follow the rules' and stop drinking and smoking in order for him to have any hope of recovery, and he expresses the intention to make these lifestyle changes.  Pt. does not seem have a sense that his condition is critical and/or imminently life-threatening despite what CH has heard in discussions earlier in admission w/palliative care and gastroenterology.  Pt. lives w/significant other and her son (he considers them wife and grandson), and finds meaning in being alive for grandson's child.  Sallis will make an effort to check in on pt. later this week; pt. says, 'I don't like being alone in here.'  Pt. is aware of chaplains' availability if needed.    06/19/20 1200  Clinical Encounter Type  Visited With Patient  Visit Type Follow-up;Psychological support;Social support  Referral From Palliative care team;Chaplain  Spiritual Encounters  Spiritual Needs Emotional  Stress Factors  Patient Stress Factors Health changes;Major life changes

## 2020-06-19 NOTE — Progress Notes (Signed)
PT Cancellation Note  Patient Details Name: Collin Byrd MRN: [id number] DOB: [birth year]   Cancelled Treatment:    Reason Eval/Treat Not Completed: Other (comment)   Discussed with [employer].  Family meeting this am to determine and clarify goals of care.  Will hold therapy session this am until care plan is established.  Pt remains very fragile and at risk of quick decline.  Will proceed cautiously with monitoring O2 sats and response to activity with a very conservative approach to sessions.     [name] 06/19/2020, 11:32 AM

## 2020-06-19 NOTE — Progress Notes (Signed)
PT Cancellation Note  Patient Details Name: Collin Byrd MRN: [id number] DOB: 06-07-1961   Cancelled Treatment:    Reason Eval/Treat Not Completed: Other (comment)   Chart reviewed.  Per MD note, plan is leaning to home with hospice services.  Given fragile nature of pt, will hold session and await confirmation of plan.   [name] 06/19/2020, 3:10 PM

## 2020-06-19 NOTE — Significant Event (Signed)
I had a lengthy conversation with the patient's grandson Gwyndolyn Saxon [phone] via phone on 06/19/2020.  I explained that though the patient acute status has improved his overall prognosis remains very poor.  I explained that she has advanced cirrhosis, hepatopulmonary syndrome, GI bleed that is precluding treatment of acute bilateral pulmonary emboli.  I explained that although his oxygen has improved his status is still very tenuous and the minimal exertion could cause a rapid deterioration.  I explained that the best course of action would likely be to discharge home with hospice services.  I told the grandson that I would get case management here and have the patient evaluated by hospice and if accepted will plan to discharge home with hospice services within 24 to 48 hours.  Asencion Gowda from palliative care has been involved in this patient's care extensively.  Please reach out to her or the primary hospitalist for any issues.  Ralene Muskrat MD

## 2020-06-20 DIAGNOSIS — J9602 Acute respiratory failure with hypercapnia: Secondary | ICD-10-CM

## 2020-06-20 DIAGNOSIS — K7031 Alcoholic cirrhosis of liver with ascites: Secondary | ICD-10-CM

## 2020-06-20 DIAGNOSIS — E1165 Type 2 diabetes mellitus with hyperglycemia: Secondary | ICD-10-CM

## 2020-06-20 DIAGNOSIS — I2699 Other pulmonary embolism without acute cor pulmonale: Secondary | ICD-10-CM

## 2020-06-20 LAB — GLUCOSE, CAPILLARY
Glucose-Capillary: 209 mg/dL — ABNORMAL HIGH (ref 70–99)
Glucose-Capillary: 224 mg/dL — ABNORMAL HIGH (ref 70–99)
Glucose-Capillary: 296 mg/dL — ABNORMAL HIGH (ref 70–99)
Glucose-Capillary: 231 mg/dL — ABNORMAL HIGH (ref 70–99)

## 2020-06-20 LAB — BASIC METABOLIC PANEL
Anion gap: 10 (ref 5–15)
BUN: 21 mg/dL — ABNORMAL HIGH (ref 6–20)
CO2: 24 mmol/L (ref 22–32)
Calcium: 9 mg/dL (ref 8.9–10.3)
Chloride: 99 mmol/L (ref 98–111)
Creatinine, Ser: 0.74 mg/dL (ref 0.61–1.24)
GFR calc Af Amer: >60 mL/min (ref >60)
GFR calc non Af Amer: >60 mL/min (ref >60)
Glucose, Bld: 247 mg/dL — ABNORMAL HIGH (ref 70–99)
Potassium: 4.4 mmol/L (ref 3.5–5.1)
Sodium: 133 mmol/L — ABNORMAL LOW (ref 135–145)

## 2020-06-20 LAB — MAGNESIUM: Magnesium: 2 mg/dL (ref 1.7–2.4)

## 2020-06-20 LAB — CBC
HCT: 33.2 % — ABNORMAL LOW (ref 39.0–52.0)
Hemoglobin: 11 g/dL — ABNORMAL LOW (ref 13.0–17.0)
MCH: 28.6 pg (ref 26.0–34.0)
MCHC: 33.1 g/dL (ref 30.0–36.0)
MCV: 86.2 fL (ref 80.0–100.0)
Platelets: 424 10*3/uL — ABNORMAL HIGH (ref 150–400)
RBC: 3.85 MIL/uL — ABNORMAL LOW (ref 4.22–5.81)
RDW: 15.9 % — ABNORMAL HIGH (ref 11.5–15.5)
WBC: 22.6 10*3/uL — ABNORMAL HIGH (ref 4.0–10.5)
nRBC: 0 % (ref 0.0–0.2)

## 2020-06-20 MED ORDER — APIXABAN 5 MG PO TABS
10.0000 mg | ORAL_TABLET | Freq: Two times a day (BID) | ORAL | Status: DC
  Administered 2020-06-20 – 2020-06-21 (×2): 10 mg via ORAL
  Filled 2020-06-20 (×2): qty 2

## 2020-06-20 MED ORDER — PREDNISONE 10 MG PO TABS: 10.0000 mg | ORAL_TABLET | Freq: Every day | ORAL | Status: DC

## 2020-06-20 MED ORDER — APIXABAN 5 MG PO TABS: 5.0000 mg | ORAL_TABLET | Freq: Two times a day (BID) | ORAL | Status: DC

## 2020-06-20 NOTE — Progress Notes (Signed)
Patient ID: Collin Byrd, male   DOB: 23-Feb-1961, 59 y.o.   MRN: [id number] [facility] PROGRESS NOTE  Gerhardt Gleed OYD:[id number] DOB: 1961-05-05 DOA: 06/05/2020 PCP: Patient, No Pcp Per  HPI/Subjective: Patient admitted with shortness of breath chest pain and leg edema.  Seen patient this morning.  Patient felt well and interested in going home.  Called back to a family meeting this afternoon with hospice and transitional care team.  Patient interested in treatment moving forward and not interested in hospice care at this time.  Objective: Vitals:   06/20/20 1157 06/20/20 1617  BP: 108/67 105/68  Pulse: 86 84  Resp: 20 18  Temp: 98.3 F (36.8 C) 98.9 F (37.2 C)  SpO2: 93% 95%    Intake/Output Summary (Last 24 hours) at 06/20/2020 1646 Last data filed at 06/20/2020 1415 Gross per 24 hour  Intake 720 ml  Output --  Net 720 ml   Filed Weights   06/17/20 0500 06/18/20 0352 06/18/20 1425  Weight: 77.3 kg 77 kg 77.7 kg    ROS: Review of Systems  Respiratory: Negative for shortness of breath.   Cardiovascular: Negative for chest pain.  Gastrointestinal: Negative for abdominal pain.   Exam: Physical Exam  HENT:  Nose: No mucosal edema.  Mouth/Throat: No oropharyngeal exudate.  Eyes: Pupils are equal, round, and reactive to light. Conjunctivae and lids are normal.  Cardiovascular: S1 normal and S2 normal. Exam reveals no gallop.  No murmur heard. Pulses:      Dorsalis pedis pulses are 2+ on the right side and 2+ on the left side.  Respiratory: No respiratory distress. He has no wheezes. He has no rhonchi. He has no rales.  GI: Soft. Bowel sounds are normal. There is no abdominal tenderness.  Musculoskeletal:     Right ankle: No swelling.     Left ankle: No swelling.  Neurological: He is alert. No cranial nerve deficit.  Skin: Skin is warm. No rash noted. Nails show no clubbing.      Data Reviewed: Basic Metabolic Panel: Recent Labs  Lab 06/14/20 0419  06/14/20 0419 06/15/20 0415 06/15/20 0415 06/16/20 0435 06/17/20 0445 06/18/20 0340 06/19/20 0524 06/20/20 0607  NA 136   < > 134*   < > 134* 132* 130* 132* 133*  K 4.2   < > 4.5   < > 4.4 4.3 4.4 4.5 4.4  CL 102   < > 99   < > 97* 94* 92* 96* 99  CO2 30   < > 28   < > 29 28 29 27 24   GLUCOSE 152*   < > 190*   < > 196* 210* 338* 330* 247*  BUN 16   < > 18   < > 14 16 20  23* 21*  CREATININE 0.47*   < > 0.57*   < > 0.57* 0.56* 0.71 0.79 0.74  CALCIUM 8.0*   < > 8.7*   < > 8.7* 9.3 8.8* 8.8* 9.0  MG 1.9  --  1.9  --   --   --  2.0 1.8 2.0  PHOS 3.9  --   --   --   --   --   --  4.7*  --    < > = values in this interval not displayed.   Liver Function Tests: Recent Labs  Lab 06/14/20 0419  ALBUMIN 3.0*   CBC: Recent Labs  Lab 06/14/20 0419 06/20/20 1500  WBC 14.4* 22.6*  HGB 9.6* 11.0*  HCT 30.0*  33.2*  MCV 90.6 86.2  PLT 268 424*   BNP (last 3 results) Recent Labs    06/05/20 0933 06/05/20 1448  BNP 100.4* 155.9*    CBG: Recent Labs  Lab 06/19/20 1135 06/19/20 2136 06/20/20 0820 06/20/20 1155 06/20/20 1642  GLUCAP 325* 334* 209* 224* 296*    Scheduled Meds: . apixaban  10 mg Oral BID   Followed by  . [START ON 06/27/2020] apixaban  5 mg Oral BID  . aspirin EC  81 mg Oral Daily  . budesonide (PULMICORT) nebulizer solution  0.5 mg Nebulization BID  . Chlorhexidine Gluconate Cloth  6 each Topical Q0600  . ferrous sulfate  325 mg Oral BID WC  . folic acid  1 mg Oral Daily  . furosemide  40 mg Oral Daily  . insulin aspart  0-20 Units Subcutaneous TID WC  . insulin aspart  0-5 Units Subcutaneous QHS  . insulin aspart  3 Units Subcutaneous TID WC  . insulin glargine  25 Units Subcutaneous QHS  . ipratropium-albuterol  3 mL Nebulization TID  . multivitamin with minerals  1 tablet Oral Daily  . pantoprazole (PROTONIX) IV  40 mg Intravenous Q12H  . pneumococcal 23 valent vaccine  0.5 mL Intramuscular Tomorrow-1000  . [START ON 06/21/2020] predniSONE  10 mg  Oral Q breakfast  . sodium chloride flush  10-40 mL Intracatheter Q12H  . spironolactone  200 mg Oral BID  . thiamine  100 mg Oral Daily   Continuous Infusions: . sodium chloride Stopped (06/07/20 1809)    Assessment/Plan:  1. Acute pulmonary embolism.  Patient interested in treatment.  Will start Eliquis 10 mg twice daily.  In speaking with the transitional care team this afternoon they have Xarelto over the medication management so I will switch that tomorrow upon discharge. Risk of bleeding explained to patient and family. 2. Acute hypoxic and hypercapnic respiratory failure.  Patient tapered off oxygen and walked around without oxygen and his pulse ox remained above 89%. 3. Alcoholic cirrhosis with ascites.  Had 1 paracentesis done.  Continue Lasix and Aldactone. 4. Acute on chronic diastolic congestive heart failure on Lasix 5. Leukocytosis.  Discontinue steroids 6. Type 2 diabetes mellitus on glargine insulin and short acting insulin    Code Status:     Code Status Orders  (From admission, onward)         Start     Ordered   06/14/20 1654  Do not attempt resuscitation (DNR)  Continuous       Question Answer Comment  In the event of cardiac or respiratory ARREST Do not call a "code blue"   In the event of cardiac or respiratory ARREST Do not perform Intubation, CPR, defibrillation or ACLS   In the event of cardiac or respiratory ARREST Use medication by any route, position, wound care, and other measures to relive pain and suffering. May use oxygen, suction and manual treatment of airway obstruction as needed for comfort.   Comments MOST form in chart.      06/14/20 1653        Code Status History    Date Active Date Inactive Code Status Order ID Comments User Context   06/05/2020 1439 06/14/2020 1653 Full Code [id number]  Ivor Costa, MD Inpatient   Advance Care Planning Activity    Advance Directive Documentation     Most Recent Value  Type of Advance Directive Living  will, Healthcare Power of Attorney  Pre-existing out of facility DNR order (yellow form  or pink MOST form) --  "MOST" Form in Place? --     Family Communication: Spoke with grandson at the bedside Disposition Plan: Status is: Inpatient   Dispo: The patient is from: Home              Anticipated d/c is to: Home with home health              Anticipated d/c date is: 06/21/2020              Patient currently starting treatment for acute pulmonary embolism with blood thinner.  Would like to see the patient tolerate this without bleeding today.  If no bleeding, we will send home tomorrow.  Consultants:  Palliative care  Time spent: 61 minutes  [place]

## 2020-06-20 NOTE — TOC Progression Note (Signed)
Transition of Care [facility]) - Progression Note    Patient Details  Name: Collin Byrd MRN: [id number] Date of Birth: 02-12-1961  Transition of Care [facility]) CM/SW Contact  Shelbie Hutching, RN Phone Number: 06/20/2020, 4:35 PM  Clinical Narrative:     Dr. Margreta Journey Rama has agreed to write for home health orders until patient can be seen at [facility].   Expected Discharge Plan: [place] Barriers to Discharge: Continued Medical Work up  Expected Discharge Plan and Services Expected Discharge Plan: [place]   Discharge Planning Services: CM Consult Post Acute Care Choice: [place] arrangements for the past 2 months: Single Family Home                           HH Arranged: RN, PT [facility] Agency: [facility] Date [place]: 06/20/20 Time [place]: 1548 Representative spoke with at [place]: [place] ([place]) Interventions    Readmission Risk Interventions No flowsheet data found.

## 2020-06-20 NOTE — Progress Notes (Signed)
[facility] [place] Liaison note:  New referral for [facility] hospice services at home received from [facility].  Patient information sent to referral. Hospice eligibility pending. Writer to follow up with DME needs and patient/ family on 6/23 am. [facility] [place] aware.  Thank you for this referral. Flo Shanks [place], [place] [phone]

## 2020-06-20 NOTE — Progress Notes (Signed)
Intentional rounding by RRT RN on this patient s/p transfer out of CCU. Per bedside RN, no issues. RN instructed to call RRT if issues arise. 

## 2020-06-20 NOTE — Progress Notes (Signed)
Physical Therapy Treatment Patient Details Name: Collin Byrd MRN: [id number] DOB: February 18, 1961 Today's Date: 06/20/2020    History of Present Illness 59 y/o M with medical history including EtOH abuse who is admitted to the ICU with acute respiratory failure, anasarca, hypovolemic hyponatremia. Now with new diagnosis of liver cirrhosis. He is s/p paracentesis on 6/9 and 2.5L was removed. He is being treated with antibiotics for SBP, lower leg cellulitis, and pneumonia.    PT Comments    Pt pleasant and motivated to participate during the session.  Pt's baseline vitals on room air included SpO2 94% and HR 88 bpm.  Pt participated in graded therex followed by bed mobility, transfers and ambulation with Spo2 dropping to a low of 93% after amb 80' and with HR WNL.  Pt reported no adverse symptoms during the session and reported ambulation feeling "easy".  Pt will benefit from HHPT services upon discharge to safely address deficits listed in patient problem list for decreased caregiver assistance and eventual return to PLOF.     Follow Up Recommendations  Home health PT     Equipment Recommendations  None recommended by PT    Recommendations for Other Services       Precautions / Restrictions Precautions Precautions: Other (comment) Precaution Comments: Watch vitals Restrictions Weight Bearing Restrictions: No    Mobility  Bed Mobility Overal bed mobility: Independent             General bed mobility comments: Good speed and effort with bed mobility tasks  Transfers Overall transfer level: Independent Equipment used: None             General transfer comment: Good eccentric and concentric control with transfers with no instability noted  Ambulation/Gait Ambulation/Gait assistance: Supervision Gait Distance (Feet): 80 Feet Assistive device: None Gait Pattern/deviations: Step-through pattern;Decreased step length - right;Decreased step length - left Gait  velocity: decreased   General Gait Details: Mildly decreased cadence but steady without LOB including during start/stops and 180 deg turns; SpO2 93% on room air and HR 98 bpm after amb with no adverse symptoms noted.   Stairs             Wheelchair Mobility    Modified Rankin (Stroke Patients Only)       Balance Overall balance assessment: Needs assistance   Sitting balance-Leahy Scale: Normal     Standing balance support: During functional activity;No upper extremity supported Standing balance-Leahy Scale: Good Standing balance comment: Pt with confident static and dynamic balance, no LOBs with ambulation                            Cognition Arousal/Alertness: Awake/alert Behavior During Therapy: WFL for tasks assessed/performed Overall Cognitive Status: Within Functional Limits for tasks assessed                                        Exercises Total Joint Exercises Ankle Circles/Pumps: Strengthening;Both;Other reps (comment) (2x10 reps with manual resistance.) Quad Sets: Strengthening;Both;Other reps (comment) (2x10 reps) Gluteal Sets: Strengthening;Both;Other reps (comment) (2x10 reps) Towel Squeeze: Strengthening;Both;10 reps Hip ABduction/ADduction: Strengthening;Both;Other reps (comment) (2x10 reps) Straight Leg Raises: Strengthening;Both;Other reps (comment) (2x10 reps) Long Arc Quad: Strengthening;Both;Other reps (comment) (2x10 reps) Knee Flexion: Strengthening;Both;Other reps (comment) (2x10 reps)    General Comments        Pertinent Vitals/Pain Pain Assessment: No/denies pain  Home Living                      Prior Function            PT Goals (current goals can now be found in the care plan section) Progress towards PT goals: Progressing toward goals    Frequency    Min 2X/week      PT Plan Current plan remains appropriate    Co-evaluation              AM-PAC PT "6 Clicks" Mobility    Outcome Measure  Help needed turning from your back to your side while in a flat bed without using bedrails?: None Help needed moving from lying on your back to sitting on the side of a flat bed without using bedrails?: None Help needed moving to and from a bed to a chair (including a wheelchair)?: None Help needed standing up from a chair using your arms (e.g., wheelchair or bedside chair)?: None Help needed to walk in hospital room?: A Little Help needed climbing 3-5 steps with a railing? : A Little 6 Click Score: 22    End of Session Equipment Utilized During Treatment: Gait belt Activity Tolerance: Patient limited by fatigue Patient left: in bed;with call bell/phone within reach;Other (comment) (Pt declined up in chair) Nurse Communication: Mobility status PT Visit Diagnosis: Muscle weakness (generalized) (M62.81);Difficulty in walking, not elsewhere classified (R26.2)     Time: 7209-4709 PT Time Calculation (min) (ACUTE ONLY): 24 min  Charges:  $Gait Training: 8-22 mins $Therapeutic Exercise: 8-22 mins                    D. Scott Melda Mermelstein PT, DPT 06/20/20, 3:40 PM

## 2020-06-20 NOTE — TOC Initial Note (Signed)
Transition of Care [facility]) - Initial/Assessment Note    Patient Details  Name: Collin Byrd MRN: [id number] Date of Birth: [birth year]  Transition of Care [facility]) CM/SW Contact:    Shelbie Hutching, RN Phone Number: 06/20/2020, 3:51 PM  Clinical Narrative:                 Patient has decided that he would like to go home with home health services and he does not want Hospice at this time.  [facility] is on for [facility] this week and has accepted the referral.  Patient will need nursing and PT.  Patient has no equipment needs at this time.  Referral has been placed to [facility] and [facility]- patient has been given the ODC/MM application in both English and Spanish and instructed to fill out and return to either [facility] or [facility].    Expected Discharge Plan: [place] Barriers to Discharge: Continued Medical Work up   Patient Goals and CMS Choice Patient states their goals for this hospitalization and ongoing recovery are:: wants to go home with home health- wants to take care of his health and live longer CMS Medicare.gov Compare Post Acute Care list provided to:: Patient Choice offered to / list presented to : Patient  Expected Discharge Plan and Services Expected Discharge Plan: [place]   Discharge Planning Services: CM Consult Post Acute Care Choice: [place] arrangements for the past 2 months: [place] Arranged: RN, PT [facility] Agency: [facility] Date [facility] Agency Contacted: 06/20/20 Time [place]: 1548 Representative spoke with at [place]: Jana Half  Prior Living Arrangements/Services Living arrangements for the past 2 months: [place] with:: Significant Other Patient language and need for interpreter reviewed:: Yes (Spanish speaking - needs interpreter) Do you feel safe going back to the place where you live?: Yes      Need for Family Participation in Patient Care:  Yes (Comment) (liver cirrhosis) Care giver support system in place?: Yes (comment) (significant other)   Criminal Activity/Legal Involvement Pertinent to Current Situation/Hospitalization: No - Comment as needed  Activities of Daily Living Home Assistive Devices/Equipment: None ADL Screening (condition at time of admission) Patient's cognitive ability adequate to safely complete daily activities?: Yes Is the patient deaf or have difficulty hearing?: No Does the patient have difficulty seeing, even when wearing glasses/contacts?: No Does the patient have difficulty concentrating, remembering, or making decisions?: No Patient able to express need for assistance with ADLs?: Yes Does the patient have difficulty dressing or bathing?: No Independently performs ADLs?: Yes (appropriate for developmental age) Does the patient have difficulty walking or climbing stairs?: No Weakness of Legs: None Weakness of Arms/Hands: None  Permission Sought/Granted Permission sought to share information with : Case Manager, Family Supports, Other (comment) Permission granted to share information with : Yes, Verbal Permission Granted  Share Information with NAME: Gwyndolyn Saxon  Permission granted to share info w AGENCY: [employer]  Permission granted to share info w Relationship: Gwyndolyn Saxon- grandson     Emotional Assessment Appearance:: Appears older than stated age Attitude/Demeanor/Rapport: Engaged Affect (typically observed): Accepting Orientation: : Oriented to Self, Oriented to Place, Oriented to  Time, Oriented to Situation Alcohol / Substance Use: Alcohol Use, Tobacco Use Psych Involvement: No (comment)  Admission diagnosis:  Shortness of breath [R06.02]  Acute respiratory failure with hypoxia ([place]) [J96.01] Community acquired pneumonia, unspecified laterality [J18.9] Patient Active Problem List   Diagnosis Date Noted  . SBP (spontaneous bacterial peritonitis) ([place]) 06/07/2020  . Hepatic cirrhosis ([place])  06/07/2020  . Edema leg   . Acute respiratory failure with hypoxia ([place]) 06/05/2020  . Hyponatremia 06/05/2020  . Elevated troponin 06/05/2020  . Leukocytosis 06/05/2020  . Bilateral lower leg cellulitis 06/05/2020  . Chest pain 06/05/2020  . Sepsis ([place]) 06/05/2020  . Alcohol abuse 06/05/2020   PCP:  Patient, No Pcp Per Pharmacy:  No Pharmacies Listed    Social Determinants of Health (SDOH) Interventions    Readmission Risk Interventions No flowsheet data found.

## 2020-06-20 NOTE — Consult Note (Signed)
[place] for Electrolyte Monitoring and Replacement   Recent Labs: Potassium (mmol/L)  Date Value  06/20/2020 4.4   Magnesium (mg/dL)  Date Value  06/20/2020 2.0   Calcium (mg/dL)  Date Value  06/20/2020 9.0   Albumin (g/dL)  Date Value  06/14/2020 3.0 (L)   Phosphorus (mg/dL)  Date Value  06/19/2020 4.7 (H)   Sodium (mmol/L)  Date Value  06/20/2020 133 (L)   Assessment: Patient is a 59 y/o M with medical history including EtOH abuse who is admitted to the ICU with acute respiratory failure, anasarca, hypovolemic hyponatremia. Now with new diagnosis of liver cirrhosis. He is s/p paracentesis on 6/9 and 2.5L was removed.  Diuretics: furosemide 40 mg daily + spironolactone 200 mg BID (max dose) (hepatopulomary syndrome)  Goal of Therapy:  Electrolytes within normal limits  Plan:  No replacement warranted at this time. Will follow electrolytes with AM labs.  Gerald Dexter, PharmD 06/20/2020 9:07 AM

## 2020-06-20 NOTE — Consult Note (Signed)
ANTICOAGULATION CONSULT NOTE - Initial Consult  Pharmacy Consult for Eliquis Indication: Pulmonary Embolism  No Known Allergies  Patient Measurements: Height: 5\' 9"  (175.3 cm) Weight: 77.7 kg (171 lb 3.2 oz) IBW/kg (Calculated) : 70.7  Vital Signs: Temp: 98.3 F (36.8 C) (06/23 1157) Temp Source: Oral (06/23 1157) BP: 108/67 (06/23 1157) Pulse Rate: 86 (06/23 1157)  Labs: Recent Labs    06/18/20 0340 06/19/20 0524 06/20/20 0607 06/20/20 1500  HGB  --   --   --  11.0*  HCT  --   --   --  33.2*  PLT  --   --   --  424*  CREATININE 0.71 0.79 0.74  --     Estimated Creatinine Clearance: 101.9 mL/min (by C-G formula based on SCr of 0.74 mg/dL).   Medical History: Past Medical History:  Diagnosis Date  . Cataract     Assessment: Pharmacy consulted to start apixaban in this 59 year-old with segmental and subsegmental bilateral pulmonary emboli as revealed by CT.  Patient also with cirrhosis.  MD aware, and will monitor for signs of bleeding as patient begins treatment.  Goal of Therapy:  Monitor platelets by anticoagulation protocol: Yes   Plan:  Baseline CBC ordered.  CBC currently WNL.  Will initiate Eliquis 10 mg BID x 7 days followed by 5 mg BID.  Will follow serum creatinine and CBC every 3 days per protocol.    Gerald Dexter, PharmD 06/20/2020,3:22 PM

## 2020-06-21 LAB — BASIC METABOLIC PANEL
Anion gap: 9 (ref 5–15)
BUN: 22 mg/dL — ABNORMAL HIGH (ref 6–20)
CO2: 26 mmol/L (ref 22–32)
Calcium: 8.8 mg/dL — ABNORMAL LOW (ref 8.9–10.3)
Chloride: 98 mmol/L (ref 98–111)
Creatinine, Ser: 0.79 mg/dL (ref 0.61–1.24)
GFR calc Af Amer: >60 mL/min (ref >60)
GFR calc non Af Amer: >60 mL/min (ref >60)
Glucose, Bld: 184 mg/dL — ABNORMAL HIGH (ref 70–99)
Potassium: 3.8 mmol/L (ref 3.5–5.1)
Sodium: 133 mmol/L — ABNORMAL LOW (ref 135–145)

## 2020-06-21 LAB — CBC
HCT: 32.2 % — ABNORMAL LOW (ref 39.0–52.0)
Hemoglobin: 11 g/dL — ABNORMAL LOW (ref 13.0–17.0)
MCH: 29.2 pg (ref 26.0–34.0)
MCHC: 34.2 g/dL (ref 30.0–36.0)
MCV: 85.4 fL (ref 80.0–100.0)
Platelets: 401 10*3/uL — ABNORMAL HIGH (ref 150–400)
RBC: 3.77 MIL/uL — ABNORMAL LOW (ref 4.22–5.81)
RDW: 15.8 % — ABNORMAL HIGH (ref 11.5–15.5)
WBC: 19.3 10*3/uL — ABNORMAL HIGH (ref 4.0–10.5)
nRBC: 0 % (ref 0.0–0.2)

## 2020-06-21 LAB — GLUCOSE, CAPILLARY
Glucose-Capillary: 144 mg/dL — ABNORMAL HIGH (ref 70–99)
Glucose-Capillary: 211 mg/dL — ABNORMAL HIGH (ref 70–99)

## 2020-06-21 LAB — MAGNESIUM: Magnesium: 1.9 mg/dL (ref 1.7–2.4)

## 2020-06-21 MED ORDER — SPIRONOLACTONE 100 MG PO TABS
100.0000 mg | ORAL_TABLET | Freq: Two times a day (BID) | ORAL | Status: DC
  Administered 2020-06-21: 100 mg via ORAL
  Filled 2020-06-21 (×2): qty 1
  Filled 2020-06-21: qty 4

## 2020-06-21 MED ORDER — INSULIN GLARGINE 100 UNIT/ML ~~LOC~~ SOLN
12.0000 [IU] | Freq: Every day | SUBCUTANEOUS | Status: DC
  Filled 2020-06-21: qty 0.12

## 2020-06-21 MED ORDER — FUROSEMIDE 40 MG PO TABS: 40.0000 mg | ORAL_TABLET | Freq: Every day | ORAL | 0 refills | Status: DC

## 2020-06-21 MED ORDER — GLIMEPIRIDE 2 MG PO TABS
2.0000 mg | ORAL_TABLET | ORAL | 0 refills | Status: DC
Start: 2020-06-21 — End: 2020-07-12

## 2020-06-21 MED ORDER — INSULIN PEN NEEDLE 31G X 5 MM MISC: 1.0000 | Freq: Three times a day (TID) | 0 refills | Status: DC

## 2020-06-21 MED ORDER — INSULIN LISPRO (1 UNIT DIAL) 100 UNIT/ML (KWIKPEN)
3.0000 [IU] | PEN_INJECTOR | Freq: Three times a day (TID) | SUBCUTANEOUS | 0 refills | Status: DC
Start: 2020-06-21 — End: 2020-06-21

## 2020-06-21 MED ORDER — SPIRONOLACTONE 100 MG PO TABS: 100.0000 mg | ORAL_TABLET | Freq: Two times a day (BID) | ORAL | 0 refills | Status: DC

## 2020-06-21 MED ORDER — THIAMINE HCL 100 MG PO TABS: 100.0000 mg | ORAL_TABLET | Freq: Every day | ORAL | 0 refills | Status: DC

## 2020-06-21 MED ORDER — BASAGLAR KWIKPEN 100 UNIT/ML ~~LOC~~ SOPN
12.0000 [IU] | PEN_INJECTOR | Freq: Every day | SUBCUTANEOUS | 0 refills | Status: DC
Start: 2020-06-21 — End: 2020-06-21

## 2020-06-21 MED ORDER — RIVAROXABAN (XARELTO) VTE STARTER PACK (15 & 20 MG)
ORAL_TABLET | ORAL | 0 refills | Status: DC
Start: 2020-06-21 — End: 2020-07-19

## 2020-06-21 MED ORDER — LIVING WELL WITH DIABETES BOOK - IN SPANISH
Freq: Once | Status: AC
  Filled 2020-06-21: qty 1

## 2020-06-21 MED ORDER — PREDNISONE 10 MG PO TABS
10.0000 mg | ORAL_TABLET | Freq: Once | ORAL | Status: AC
  Administered 2020-06-21: 10:00:00 10 mg via ORAL
  Filled 2020-06-21: qty 1

## 2020-06-21 MED ORDER — LIVING WELL WITH DIABETES BOOK
Freq: Once | Status: AC
  Filled 2020-06-21: qty 1

## 2020-06-21 MED ORDER — PANTOPRAZOLE SODIUM 40 MG PO TBEC: 40.0000 mg | DELAYED_RELEASE_TABLET | Freq: Every day | ORAL | 0 refills | Status: DC

## 2020-06-21 MED ORDER — FOLIC ACID 1 MG PO TABS: 1.0000 mg | ORAL_TABLET | Freq: Every day | ORAL | 0 refills | Status: DC

## 2020-06-21 MED ORDER — FERROUS SULFATE 325 (65 FE) MG PO TABS: 325.0000 mg | ORAL_TABLET | Freq: Every day | ORAL | 0 refills | Status: DC

## 2020-06-21 NOTE — Plan of Care (Signed)
  RD consulted for nutrition education regarding diabetes.   Lab Results  Component Value Date   HGBA1C 7.5 (H) 06/06/2020   Met with patient at bedside. Could not find it documented in chart that patient needs Spanish interpreter but noted video interpreter was outside patient's room at time of RD assessment. Patient initially reported he did not need interpreter but then requested RD use interpreter. First used [employer] [phone]) but then that call was lost and used Odalisa [phone]). Patient reports he typically eats 3 meals per day at home. He may have lasagna, spaghetti, chicken, rice, or other meals prepared at home. When attempting to explain what a carbohydrate is patient had a very hard time understanding. That is when he requested RD use interpreter line. Even with interpreter patient was having a hard time understanding the concept of a carbohydrate, foods that contain carbohydrates, and portion sizes of different foods that contain carbohydrates. He then requested RD bring handout in Spanish, which RD did. He then reported he could not read either as he does not have his glasses. Patient ended up calling his grandson over the phone. RD provided diet education to grandson. Grandson reports his significant other counts carbohydrates so they will be able to help patient at home.  RD provided "Carbohydrate Counting for People with Diabetes" handout from the Academy of Nutrition and Dietetics. Discussed different food groups and their effects on blood sugar, emphasizing carbohydrate-containing foods. Provided list of carbohydrates and recommended serving sizes of common foods.  Discussed importance of controlled and consistent carbohydrate intake throughout the day. Provided examples of ways to balance meals/snacks and encouraged intake of high-fiber, whole grain complex carbohydrates. Teach back method used.  Expect fair compliance.  Current diet order is heart healthy/carbohydrate modified,  patient is consuming approximately 100% of meals at this time. Labs and medications reviewed. No further nutrition interventions warranted at this time. RD contact information provided. If additional nutrition issues arise, please re-consult RD.  Jacklynn Barnacle, MS, RD, LDN Pager number available on Amion

## 2020-06-21 NOTE — Discharge Summary (Signed)
[facility] [place] NAME: Collin Byrd    MR#:  [id number]  DATE OF BIRTH:  Sep 05, 1961  DATE OF ADMISSION:  06/05/2020 ADMITTING PHYSICIAN: Ivor Costa, MD  DATE OF DISCHARGE: 06/21/2020  4:00 PM  PRIMARY CARE PHYSICIAN: [facility]    ADMISSION DIAGNOSIS:  Shortness of breath [R06.02] Acute respiratory failure with hypoxia ([place]) [J96.01] Community acquired pneumonia, unspecified laterality [J18.9]  DISCHARGE DIAGNOSIS:  Principal Problem:   Acute respiratory failure with hypoxia and hypercapnia (HCC) Active Problems:   Hyponatremia   Elevated troponin   Leukocytosis   Bilateral lower leg cellulitis   Chest pain   Sepsis (HCC)   Alcohol abuse   Edema leg   SBP (spontaneous bacterial peritonitis) ([place])   Hepatic cirrhosis (HCC)   Acute pulmonary embolism (HCC)   Ascites due to alcoholic cirrhosis ([place])   Type 2 diabetes mellitus with hyperglycemia, without long-term current use of insulin ([place])   SECONDARY DIAGNOSIS:   Past Medical History:  Diagnosis Date   Cataract     HOSPITAL COURSE:   1.  Acute pulmonary embolism.  Initially they were thinking about hospice and no treatment.  Family and patient interested in treatment.  I did start Eliquis but unfortunately the medication management does not have Eliquis so I did switch him over to Xarelto starter pack.  Risk of bleeding explained to the patient through the translator today.  Also explained the risk of bleeding to his grandson yesterday. 2.  Acute hypoxic respiratory failure.  Patient has been tapered off oxygen completely. 3.  Alcoholic cirrhosis with ascites.  Patient had paracentesis done.  Patient was placed on Lasix and Aldactone. 4.  Acute on chronic diastolic congestive heart failure.  Patient on Lasix.  This is likely secondary to the liver rather than the heart.  Blood pressure too low to add beta-blocker at this point. 5.  Leukocytosis discontinue  steroids 6.  Type 2 diabetes mellitus.  In speaking with the diabetes coordinator will go with low-dose Amaryl 2 mg daily and discontinue insulin. 7.  Clinical sepsis on presentation with acute community-acquired pneumonia and lower extremity cellulitis and presumed spontaneous bacterial peritonitis.  Patient completed antibiotic course prior to me coming on the case. 8.  Palliative care to follow-up as outpatient 9.  Patient is a DNR  DISCHARGE CONDITIONS:   Satisfactory  CONSULTS OBTAINED:    DRUG ALLERGIES:  No Known Allergies  DISCHARGE MEDICATIONS:   Allergies as of 06/21/2020   No Known Allergies     Medication List    TAKE these medications   ferrous sulfate 325 (65 FE) MG tablet Take 1 tablet (325 mg total) by mouth daily with breakfast.   folic acid 1 MG tablet Commonly known as: FOLVITE Take 1 tablet (1 mg total) by mouth daily.   furosemide 40 MG tablet Commonly known as: LASIX Take 1 tablet (40 mg total) by mouth daily.   glimepiride 2 MG tablet Commonly known as: Amaryl Take 1 tablet (2 mg total) by mouth every morning.   pantoprazole 40 MG tablet Commonly known as: Protonix Take 1 tablet (40 mg total) by mouth daily.   Rivaroxaban Stater Pack (15 mg and 20 mg) Commonly known as: XARELTO STARTER PACK Follow package directions: Take one 15mg  tablet by mouth twice a day. On day 22, switch to one 20mg  tablet once a day. Take with food.   spironolactone 100 MG tablet Commonly known as: ALDACTONE Take 1 tablet (100  mg total) by mouth 2 (two) times daily.   thiamine 100 MG tablet Take 1 tablet (100 mg total) by mouth daily.        DISCHARGE INSTRUCTIONS:   [facility] Will need referral to liver specialist as outpatient  If you experience worsening of your admission symptoms, develop shortness of breath, life threatening emergency, suicidal or homicidal thoughts you must seek medical attention immediately by calling 911 or  calling your MD immediately  if symptoms less severe.  You Must read complete instructions/literature along with all the possible adverse reactions/side effects for all the Medicines you take and that have been prescribed to you. Take any new Medicines after you have completely understood and accept all the possible adverse reactions/side effects.   Please note  You were cared for by a hospitalist during your hospital stay. If you have any questions about your discharge medications or the care you received while you were in the hospital after you are discharged, you can call the unit and asked to speak with the hospitalist on call if the hospitalist that took care of you is not available. Once you are discharged, your primary care physician will handle any further medical issues. Please note that NO REFILLS for any discharge medications will be authorized once you are discharged, as it is imperative that you return to your primary care physician (or establish a relationship with a primary care physician if you do not have one) for your aftercare needs so that they can reassess your need for medications and monitor your lab values.    Today   CHIEF COMPLAINT:   Chief Complaint  Patient presents with   Shortness of Breath    HISTORY OF PRESENT ILLNESS:  Collin Byrd  is a 59 y.o. male came in with shortness of breath   VITAL SIGNS:  Blood pressure 131/81, pulse 89, temperature 97.8 F (36.6 C), temperature source Oral, resp. rate 18, height 5\' 9"  (1.753 m), weight 77.7 kg, SpO2 95 %.   PHYSICAL EXAMINATION:  GENERAL:  59 y.o.-year-old patient lying in the bed with no acute distress.  EYES: Right pupil has some clouding.  Left pupil reactive to light. HEENT: Head atraumatic, normocephalic. Oropharynx and nasopharynx clear.  LUNGS: Normal breath sounds bilaterally, no wheezing, rales,rhonchi or crepitation. No use of accessory muscles of respiration.  CARDIOVASCULAR: S1, S2 normal.  No murmurs, rubs, or gallops.  ABDOMEN: Soft, non-tender. EXTREMITIES: No pedal edema.  NEUROLOGIC: Cranial nerves II through XII are intact. Muscle strength 5/5 in all extremities. Sensation intact. Gait not checked.  PSYCHIATRIC: The patient is alert and oriented x 3.  SKIN: No obvious rash, lesion, or ulcer.   DATA REVIEW:   CBC Recent Labs  Lab 06/21/20 0448  WBC 19.3*  HGB 11.0*  HCT 32.2*  PLT 401*    Chemistries  Recent Labs  Lab 06/21/20 0448  NA 133*  K 3.8  CL 98  CO2 26  GLUCOSE 184*  BUN 22*  CREATININE 0.79  CALCIUM 8.8*  MG 1.9    Microbiology Results  Results for orders placed or performed during the hospital encounter of 06/05/20  SARS Coronavirus 2 by RT PCR (hospital order, performed in [facility] hospital lab) Nasopharyngeal Nasopharyngeal Swab     Status: None   Collection Time: 06/05/20  9:33 AM   Specimen: Nasopharyngeal Swab  Result Value Ref Range Status   SARS Coronavirus 2 NEGATIVE NEGATIVE Final    Comment: (NOTE) SARS-CoV-2 target nucleic  acids are NOT DETECTED. The SARS-CoV-2 RNA is generally detectable in upper and lower respiratory specimens during the acute phase of infection. The lowest concentration of SARS-CoV-2 viral copies this assay can detect is 250 copies / mL. A negative result does not preclude SARS-CoV-2 infection and should not be used as the sole basis for treatment or other patient management decisions.  A negative result may occur with improper specimen collection / handling, submission of specimen other than nasopharyngeal swab, presence of viral mutation(s) within the areas targeted by this assay, and inadequate number of viral copies (<250 copies / mL). A negative result must be combined with clinical observations, patient history, and epidemiological information. Fact Sheet for Patients:   StrictlyIdeas.no Fact Sheet for Healthcare  Providers: BankingDealers.co.za This test is not yet approved or cleared  by the [place] FDA and has been authorized for detection and/or diagnosis of SARS-CoV-2 by FDA under an Emergency Use Authorization (EUA).  This EUA will remain in effect (meaning this test can be used) for the duration of the COVID-19 declaration under Section 564(b)(1) of the Act, 21 U.S.C. section 360bbb-3(b)(1), unless the authorization is terminated or revoked sooner. Performed at [facility], [address] [place], [place] [address]   Blood culture (routine x 2)     Status: None   Collection Time: 06/05/20 10:26 AM   Specimen: BLOOD  Result Value Ref Range Status   Specimen Description   Final    BLOOD Blood Culture results may not be optimal due to an excessive volume of blood received in culture bottles   Special Requests   Final    BOTTLES DRAWN AEROBIC AND ANAEROBIC LEFT ANTECUBITAL   Culture   Final    NO GROWTH 5 DAYS Performed at [facility], [address] [place], [place] [address]    Report Status 06/10/2020 FINAL  Final  Blood culture (routine x 2)     Status: None   Collection Time: 06/05/20 10:26 AM   Specimen: BLOOD  Result Value Ref Range Status   Specimen Description   Final    BLOOD Blood Culture results may not be optimal due to an excessive volume of blood received in culture bottles   Special Requests   Final    BOTTLES DRAWN AEROBIC AND ANAEROBIC RIGHT ANTECUBITAL   Culture   Final    NO GROWTH 5 DAYS Performed at [facility], [address] [place], [place] [address]    Report Status 06/10/2020 FINAL  Final  MRSA PCR Screening     Status: None   Collection Time: 06/05/20  2:42 PM   Specimen: Nasopharyngeal  Result Value Ref Range Status   MRSA by PCR NEGATIVE NEGATIVE Final    Comment:        The GeneXpert MRSA Assay (FDA approved for NASAL specimens only), is one component of a comprehensive MRSA  colonization surveillance program. It is not intended to diagnose MRSA infection nor to guide or monitor treatment for MRSA infections. Performed at [facility], [address], [place], [place] [address]      Management plans discussed with the patient, family and they are in agreement.  CODE STATUS:     Code Status Orders  (From admission, onward)         Start     Ordered   06/14/20 1654  Do not attempt resuscitation (DNR)  Continuous       Question Answer Comment  In the event of cardiac  or respiratory ARREST Do not call a code blue   In the event of cardiac or respiratory ARREST Do not perform Intubation, CPR, defibrillation or ACLS   In the event of cardiac or respiratory ARREST Use medication by any route, position, wound care, and other measures to relive pain and suffering. May use oxygen, suction and manual treatment of airway obstruction as needed for comfort.   Comments MOST form in chart.      06/14/20 1653        Code Status History    Date Active Date Inactive Code Status Order ID Comments User Context   06/05/2020 1439 06/14/2020 1653 Full Code [id number]  Ivor Costa, MD Inpatient   Advance Care Planning Activity    Advance Directive Documentation     Most Recent Value  Type of Advance Directive Living will, Healthcare Power of Attorney  Pre-existing out of facility DNR order (yellow form or pink MOST form) --  "MOST" Form in Place? --      TOTAL TIME TAKING CARE OF THIS PATIENT: 35 minutes.    Loletha Grayer M.D on 06/21/2020 at 4:52 PM  Between 7am to 6pm - Pager - [phone]  After 6pm go to [email or web address] - password [email or web address]  [facility]  CC: Primary care physician; [facility]

## 2020-06-21 NOTE — Consult Note (Signed)
[place] for Electrolyte Monitoring and Replacement   Recent Labs: Potassium (mmol/L)  Date Value  06/21/2020 3.8   Magnesium (mg/dL)  Date Value  06/21/2020 1.9   Calcium (mg/dL)  Date Value  06/21/2020 8.8 (L)   Albumin (g/dL)  Date Value  06/14/2020 3.0 (L)   Phosphorus (mg/dL)  Date Value  06/19/2020 4.7 (H)   Sodium (mmol/L)  Date Value  06/21/2020 133 (L)   Assessment: Patient is a 59 y/o M with medical history including EtOH abuse who is admitted to the ICU with acute respiratory failure, anasarca, hypovolemic hyponatremia. Now with new diagnosis of liver cirrhosis. He is s/p paracentesis on 6/9 and 2.5L was removed.  Diuretics: furosemide 40 mg daily + spironolactone 100 mg BID (max dose) (hepatopulomary syndrome)  Goal of Therapy:  Electrolytes within normal limits  Plan:  Electrolytes WNL - No replacement warranted at this time. Will follow electrolytes with AM labs.  Gerald Dexter, PharmD 06/21/2020 9:52 AM

## 2020-06-21 NOTE — Progress Notes (Signed)
[name]) Hospital Liaison RN note  Met with patient, patient's grandson Collin Byrd, Dr. Earleen Newport, [facility] [place] and interpreter.   Patient expressed that he is not interested in hospice care at this time. He is interested in pursuing treatment and returning home with home health services.   Will continue to follow patient with outpatient palliative care services upon discharge.  Please call with any questions or concerns.  Thank you, [name], BSN, RN [facility] Liaison [phone]

## 2020-06-21 NOTE — Discharge Instructions (Signed)
Information on my medicine - XARELTO (rivaroxaban)  This medication education was reviewed with me or my healthcare representative as part of my discharge preparation.    WHY WAS XARELTO PRESCRIBED FOR YOU? Xarelto was prescribed to treat blood clots that may have been found in the veins of your legs (deep vein thrombosis) or in your lungs (pulmonary embolism) and to reduce the risk of them occurring again.  What do you need to know about Xarelto? The starting dose is one 15 mg tablet taken TWICE daily with food for the FIRST 21 DAYS then on 07/11/20  the dose is changed to one 20 mg tablet taken ONCE A DAY with your evening meal.  DO NOT stop taking Xarelto without talking to the health care provider who prescribed the medication.  Refill your prescription for 20 mg tablets before you run out.  After discharge, you should have regular check-up appointments with your healthcare provider that is prescribing your Xarelto.  In the future your dose may need to be changed if your kidney function changes by a significant amount.  What do you do if you miss a dose? If you are taking Xarelto TWICE DAILY and you miss a dose, take it as soon as you remember. You may take two 15 mg tablets (total 30 mg) at the same time then resume your regularly scheduled 15 mg twice daily the next day.  If you are taking Xarelto ONCE DAILY and you miss a dose, take it as soon as you remember on the same day then continue your regularly scheduled once daily regimen the next day. Do not take two doses of Xarelto at the same time.   Important Safety Information Xarelto is a blood thinner medicine that can cause bleeding. You should call your healthcare provider right away if you experience any of the following: ? Bleeding from an injury or your nose that does not stop. ? Unusual colored urine (red or dark brown) or unusual colored stools (red or black). ? Unusual bruising for unknown reasons. ? A serious fall or  if you hit your head (even if there is no bleeding).  Some medicines may interact with Xarelto and might increase your risk of bleeding while on Xarelto. To help avoid this, consult your healthcare provider or pharmacist prior to using any new prescription or non-prescription medications, including herbals, vitamins, non-steroidal anti-inflammatory drugs (NSAIDs) and supplements.  This website has more information on Xarelto: [email or web address]

## 2020-06-21 NOTE — Progress Notes (Signed)
Inpatient Diabetes Program Recommendations  AACE/ADA: New Consensus Statement on Inpatient Glycemic Control (2015)  Target Ranges:  Prepandial:   less than 140 mg/dL      Peak postprandial:   less than 180 mg/dL (1-2 hours)      Critically ill patients:  140 - 180 mg/dL   Lab Results  Component Value Date   GLUCAP 211 (H) 06/21/2020   HGBA1C 7.5 (H) 06/06/2020    Review of Glycemic Control Results for Collin Byrd, Collin Byrd (MRN [id number]) as of 06/21/2020 12:42  Ref. Range 06/20/2020 11:55 06/20/2020 16:42 06/20/2020 21:07 06/21/2020 07:50 06/21/2020 12:08  Glucose-Capillary Latest Ref Range: 70 - 99 mg/dL 224 (H) 296 (H) 231 (H) 144 (H) 211 (H)   Diabetes history: New diagnosis of DM Outpatient Diabetes medications: None Current orders for Inpatient glycemic control:  Novolog resistant tid with meals and HS Novolog 3 units tid with meals Lantus 12 units q HS  Inpatient Diabetes Program Recommendations:    A1C is 7.5%. Blood sugars likely increased due to steroids (which have been stopped).  Recommend d/c on oral DM agent such as Amaryl 2 mg daily. Discussed with MD.    [name] with patient at bedside regarding new diagnosis of DM.  Asked about using translator and he refused stating "I understand".  Showed him the Reli-on meter that was given to him by TOC.  He states that his grandson will be helping him with monitoring.  We discussed normal blood sugar values of 80-130 mg/dL and that blood sugar less than 70 mg/dL is too low.  Patient was able to teach back.  We discussed how to treat a low blood sugar and he correctly stated "I will drink juice/eat something".  We further discussed his current A1C and the importance of checking his blood sugars twice a day (he states Yolanda Bonine will do this).  Discussed basic diet information such as limiting carbohydrates/starches such as potatoes, rice, and breads.  Also encouraged him to not drink beverages that have sugar such as sodas/juices and increase  water intake.  Discussed importance of f/u with MD as well and that he should take his blood sugar readings. Patient very thankful to be going home and states "I cant wait to see my grandson".    Also spoke to patients grandson by phone.  He expressed relief that grandfather would not be on insulin stating "I don't want needles around my son and I am in recovery".  He states that his mother has DM and he is familiar with monitoring blood sugars. We discussed normal blood sugar values and hypoglycemia as well.  Grandson asked me to spell out so that he could write down.  Told him that we have given his grandfather a meter as well.  He was very Patent attorney.  We also discussed basic diet information.  Note that patient will have home health which will be helpful as well.   Thanks  Adah Perl, RN, BC-ADM Inpatient Diabetes Coordinator Pager [phone] (8a-5p)

## 2020-06-21 NOTE — TOC Transition Note (Signed)
Transition of Care [facility]) - CM/SW Discharge Note   Patient Details  Name: Collin Byrd MRN: [id number] Date of Birth: [birth year]  [facility] CM/SW Contact:  Shelbie Hutching, RN Phone Number: 06/21/2020, 9:45 AM   Clinical Narrative:     Patient will discharge home today with home health services through [facility].  Jana Half with [facility] notified of discharge today, home health orders are in for RN, PT, OT, and aide.  Patient will also be followed by [facility] for outpatient Palliative services.  Patient's significant other's grandson, Gwyndolyn Saxon will be picking patient up today.  Patient's medications [facility] and will be ready to pick up by patient at discharge.  Patient will be going home on insulin, this RNCM is providing patient with a glucometer kit including, strips and lancet.  Patient is aware that he needs to follow up with [facility] and complete the application form.    Final next level of care: [place] Barriers to Discharge: Barriers Resolved   Patient Goals and CMS Choice Patient states their goals for this hospitalization and ongoing recovery are:: wants to go home with home health- wants to take care of his health and live longer CMS Medicare.gov Compare Post Acute Care list provided to:: Patient Choice offered to / list presented to : Patient  Discharge Placement                       Discharge Plan and Services   Discharge Planning Services: CM Consult Post Acute Care Choice: Home Health                    HH Arranged: RN, PT, OT, Nurse's Aide [facility] Agency: [facility] Date [facility] Agency Contacted: 06/21/20 Time [place]: [phone] Representative spoke with at [place]: [place]) Interventions     Readmission Risk Interventions No flowsheet data found.

## 2020-06-27 ENCOUNTER — Telehealth: Payer: Self-pay | Admitting: General Practice

## 2020-06-28 NOTE — Telephone Encounter (Addendum)
Pt did not respond because call could not be completed. ----- Message from Leda Min, [place] sent at 06/25/2020  8:39 AM EDT ----- Regarding: FW: new patient referral Please call patient to go over eligibility and explain process to get an appt. ----- Message ----- From: Shelbie Hutching, RN Sent: 06/20/2020   2:06 PM EDT To: Josefine Class, CMA Subject: new patient referral                           This patient needs follow up as soon as possible-  He will discharge tomorrow.  No insurance, no PCP.  I have given him the application.  MD will send scripts over tomorrow- Inez Catalina do you guys have Eliquis or Xaralto?  Patient has pulmonary emboli, liver cirrhosis admitted with acute respiratory failure- desperately needs follow up- please call patient's grandson, Gwyndolyn Saxon, to set up appointment- [phone]- you can also call the patient but you will need a Spanish interpreter.  Thanks [place]

## 2020-07-03 ENCOUNTER — Telehealth: Payer: Self-pay | Admitting: Pharmacy Technician

## 2020-07-03 NOTE — Telephone Encounter (Signed)
Patient received a 30 day supply of medication.  Provided patient with new patient packet to obtain ongoing [facility] services.  [facility] must receive requested financial documentation within 30 days in order to determine eligibility and provide additional medication assistance.  Shabree Tebbetts J. Yania Bogie Care Manager [facility] 

## 2020-07-04 ENCOUNTER — Telehealth: Payer: Self-pay | Admitting: General Practice

## 2020-07-04 NOTE — Telephone Encounter (Signed)
Call could not be completed. 

## 2020-07-11 ENCOUNTER — Telehealth: Payer: Self-pay | Admitting: General Practice

## 2020-07-11 NOTE — Telephone Encounter (Signed)
Scheduled an appt

## 2020-07-12 ENCOUNTER — Encounter: Payer: Self-pay | Admitting: Gerontology

## 2020-07-12 ENCOUNTER — Ambulatory Visit: Payer: Self-pay | Admitting: Gerontology

## 2020-07-12 VITALS — BP 114/77 | HR 111 | Ht 69.0 in | Wt 176.0 lb

## 2020-07-12 DIAGNOSIS — K7030 Alcoholic cirrhosis of liver without ascites: Secondary | ICD-10-CM

## 2020-07-12 DIAGNOSIS — I2699 Other pulmonary embolism without acute cor pulmonale: Secondary | ICD-10-CM

## 2020-07-12 DIAGNOSIS — Z7689 Persons encountering health services in other specified circumstances: Secondary | ICD-10-CM

## 2020-07-12 DIAGNOSIS — E1165 Type 2 diabetes mellitus with hyperglycemia: Secondary | ICD-10-CM

## 2020-07-12 DIAGNOSIS — Z8679 Personal history of other diseases of the circulatory system: Secondary | ICD-10-CM | POA: Insufficient documentation

## 2020-07-12 HISTORY — DX: Personal history of other diseases of the circulatory system: Z86.79

## 2020-07-12 MED ORDER — GLIMEPIRIDE 2 MG PO TABS: 2.0000 mg | ORAL_TABLET | ORAL | 0 refills | Status: DC

## 2020-07-12 MED ORDER — THIAMINE HCL 100 MG PO TABS: 100.0000 mg | ORAL_TABLET | Freq: Every day | ORAL | 0 refills | Status: DC

## 2020-07-12 MED ORDER — FOLIC ACID 1 MG PO TABS: 1.0000 mg | ORAL_TABLET | Freq: Every day | ORAL | 0 refills | Status: DC

## 2020-07-12 MED ORDER — BLOOD GLUCOSE MONITOR KIT: PACK | 0 refills | Status: AC

## 2020-07-12 NOTE — Progress Notes (Signed)
New Patient Office Visit  Subjective:  Patient ID: Collin Byrd, male    DOB: [birth year]  Age: 59 y.o. MRN: [id number]  CC:  Chief Complaint  Patient presents with  . Hospitalization Follow-up    ascites    HPI Collin Byrd is a 59 y.o male who presents for establishment of care and evaluation of his chronic condition. He was recently seen in the ED on 06/05/2020 for shortness of breath. He states that his breathing is stable and denies chest pain, chest tightness, shortness of breath or dyspnea on exertion. He has a history of Type 2 diabetes and his last HgbA1c done on 06/06/2020 was 7.5%. He reports checking his blood glucose BID and states that his fasting blood glucose ranges beteween 38m/dL to 1739mdL. He denies experiencing symptoms of hypo or hyperglycemia and peripheral neuropathy. During his hospitalization, he had Acute Pulmonary embolism and was started on Xarelto 20 mg daily, he denies bruises, hematuria, hematochezia and active bleeding. He also has a history of Ascites due to alcoholic cirrhosis, Paracentesis was done and he continues Furosemide 40 mg and Spironolactone 100 mg bid. He denies abdominal distention, right upper quadrant abdominal pain and jaundice. He also has Congestive Heart Failure, denies chest pain, shortness of breath. Overall, he states that he is doing well and offers no additional complaint at this time.    Past Medical History:  Diagnosis Date  . Cataract   . [facility]    Past Surgical History:  Procedure Laterality Date  . CATARACT EXTRACTION      Family History  Problem Relation Age of Onset  . Heart disease Neg Hx     Social History   Socioeconomic History  . Marital status: Married    Spouse name: Not on file  . Number of children: Not on file  . Years of education: Not on file  . Highest education level: Not on file  Occupational History  . Not on file  Tobacco Use  . Smoking status:  Former Smoker    Packs/day: 0.25    Types: Cigarettes  . Smokeless tobacco: Never Used  Vaping Use  . Vaping Use: Never used  Substance and Sexual Activity  . Alcohol use: Not Currently  . Drug use: Never  . Sexual activity: Not Currently  Other Topics Concern  . Not on file  Social History Narrative  . Not on file   Social Determinants of Health   Financial Resource Strain:   . Difficulty of Paying Living Expenses:   Food Insecurity:   . Worried About RuCharity fundraisern the Last Year:   . RaArboriculturistn the Last Year:   Transportation Needs:   . LaFilm/video editorMedical):   . Marland Kitchenack of Transportation (Non-Medical):   Physical Activity:   . Days of Exercise per Week:   . Minutes of Exercise per Session:   Stress:   . Feeling of Stress :   Social Connections:   . Frequency of Communication with Friends and Family:   . Frequency of Social Gatherings with Friends and Family:   . Attends Religious Services:   . Active Member of Clubs or Organizations:   . Attends ClArchivisteetings:   . Marland Kitchenarital Status:   Intimate Partner Violence:   . Fear of Current or Ex-Partner:   . Emotionally Abused:   . Marland Kitchenhysically Abused:   . Sexually Abused:     ROS Review of Systems  Constitutional: Negative.   HENT: Negative.   Eyes: Negative.   Respiratory: Negative.   Cardiovascular: Negative.   Gastrointestinal: Negative.   Endocrine: Negative.   Genitourinary: Negative.   Musculoskeletal: Negative.   Skin: Negative.   Allergic/Immunologic: Negative.   Neurological: Negative.   Hematological: Negative.   Psychiatric/Behavioral: Negative.     Objective:   Today's Vitals: BP 114/77 (BP Location: Left Arm, Patient Position: Sitting)   Pulse (!) 111   Ht _0  (1.753 m)   Wt 176 lb (79.8 kg)   SpO2 99%   BMI 25.99 kg/m   Physical Exam Constitutional:      Appearance: Normal appearance.  HENT:     Head: Normocephalic and atraumatic.     Nose: Nose  normal.  Eyes:     Pupils: Pupils are equal, round, and reactive to light.  Cardiovascular:     Rate and Rhythm: Normal rate and regular rhythm.     Pulses: Normal pulses.     Heart sounds: Normal heart sounds.  Pulmonary:     Effort: Pulmonary effort is normal.     Breath sounds: Normal breath sounds.  Abdominal:     General: Abdomen is flat.     Palpations: Abdomen is soft.  Musculoskeletal:        General: Normal range of motion.     Cervical back: Normal range of motion and neck supple.  Skin:    Capillary Refill: Capillary refill takes less than 2 seconds.  Neurological:     General: No focal deficit present.     Mental Status: He is alert and oriented to person, place, and time.  Psychiatric:        Behavior: Behavior normal.        Thought Content: Thought content normal.        Judgment: Judgment normal.     Assessment & Plan:   1. Type 2 diabetes mellitus with hyperglycemia, without long-term current use of insulin (HCC) His HgbA1c was 7.5% and his goal should be less than 7%. He will continue current treatment regimen.  - glimepiride (AMARYL) 2 MG tablet; Take 1 tablet (2 mg total) by mouth every morning.  Dispense: 30 tablet; Refill: 0 - blood glucose meter kit and supplies KIT; Dispense based on patient and insurance preference. Use up to four times daily as directed. (FOR ICD-9 250.00, 250.01).  Dispense: 1 each; Refill: 0 -he was advised to continue ADA diet and exercise as tolerated.  2. Alcoholic cirrhosis, unspecified whether ascites present (HCC) - folic acid (FOLVITE) 1 MG tablet; Take 1 tablet (1 mg total) by mouth daily.  Dispense: 30 tablet; Refill: 0 - thiamine 100 MG tablet; Take 1 tablet (100 mg total) by mouth daily.  Dispense: 30 tablet; Refill: 0 - Ambulatory referral to Gastroenterology  3. Acute pulmonary embolism, unspecified pulmonary embolism type, unspecified whether acute cor pulmonale present [facility])  - Ambulatory referral to Hematology /  Oncology for Coagulation study.  4. Encounter to establish care  Routine labs will be checked -CBC; Future C Met; Future TSH; Future Urinalysis; Future, HgbA1C; Future Lipids; Future     5. History of congestive heart failure . -  He was advised to complete [facility] financial application for AMB referral to CHF clinic   Outpatient Encounter Medications as of 07/12/2020  Medication Sig  . ferrous sulfate 325 (65 FE) MG tablet Take 1 tablet (325 mg total) by mouth daily with breakfast.  . folic acid (FOLVITE) 1 MG tablet Take  1 tablet (1 mg total) by mouth daily.  . furosemide (LASIX) 40 MG tablet Take 1 tablet (40 mg total) by mouth daily.  Marland Kitchen glimepiride (AMARYL) 2 MG tablet Take 1 tablet (2 mg total) by mouth every morning.  . pantoprazole (PROTONIX) 40 MG tablet Take 1 tablet (40 mg total) by mouth daily.  Marland Kitchen RIVAROXABAN (XARELTO) VTE STARTER PACK (15 & 20 MG TABLETS) Follow package directions: Take one 55m tablet by mouth twice a day. On day 22, switch to one 221mtablet once a day. Take with food.  . Marland Kitchenpironolactone (ALDACTONE) 100 MG tablet Take 1 tablet (100 mg total) by mouth 2 (two) times daily.  . Marland Kitchenhiamine 100 MG tablet Take 1 tablet (100 mg total) by mouth daily.  . [DISCONTINUED] folic acid (FOLVITE) 1 MG tablet Take 1 tablet (1 mg total) by mouth daily.  . [DISCONTINUED] glimepiride (AMARYL) 2 MG tablet Take 1 tablet (2 mg total) by mouth every morning.  . [DISCONTINUED] thiamine 100 MG tablet Take 1 tablet (100 mg total) by mouth daily.  . blood glucose meter kit and supplies KIT Dispense based on patient and insurance preference. Use up to four times daily as directed. (FOR ICD-9 250.00, 250.01).   No facility-administered encounter medications on file as of 07/12/2020.    Follow-up: Return in about 4 weeks (around 08/09/2020), or if symptoms worsen or fail to improve.   StCarney CornersRN

## 2020-07-12 NOTE — Patient Instructions (Signed)
DASH Eating Plan DASH stands for "Dietary Approaches to Stop Hypertension." The DASH eating plan is a healthy eating plan that has been shown to reduce high blood pressure (hypertension). It may also reduce your risk for type 2 diabetes, heart disease, and stroke. The DASH eating plan may also help with weight loss. What are tips for following this plan?  General guidelines  Avoid eating more than 2,300 mg (milligrams) of salt (sodium) a day. If you have hypertension, you may need to reduce your sodium intake to 1,500 mg a day.  Limit alcohol intake to no more than 1 drink a day for nonpregnant women and 2 drinks a day for men. One drink equals 12 oz of beer, 5 oz of wine, or 1 oz of hard liquor.  Work with your health care provider to maintain a healthy body weight or to lose weight. Ask what an ideal weight is for you.  Get at least 30 minutes of exercise that causes your heart to beat faster (aerobic exercise) most days of the week. Activities may include walking, swimming, or biking.  Work with your health care provider or diet and nutrition specialist (dietitian) to adjust your eating plan to your individual calorie needs. Reading food labels   Check food labels for the amount of sodium per serving. Choose foods with less than 5 percent of the Daily Value of sodium. Generally, foods with less than 300 mg of sodium per serving fit into this eating plan.  To find whole grains, look for the word "whole" as the first word in the ingredient list. Shopping  Buy products labeled as "low-sodium" or "no salt added."  Buy fresh foods. Avoid canned foods and premade or frozen meals. Cooking  Avoid adding salt when cooking. Use salt-free seasonings or herbs instead of table salt or sea salt. Check with your health care provider or pharmacist before using salt substitutes.  Do not fry foods. Cook foods using healthy methods such as baking, boiling, grilling, and broiling instead.  Cook with  heart-healthy oils, such as olive, canola, soybean, or sunflower oil. Meal planning  Eat a balanced diet that includes: ? 5 or more servings of fruits and vegetables each day. At each meal, try to fill half of your plate with fruits and vegetables. ? Up to 6-8 servings of whole grains each day. ? Less than 6 oz of lean meat, poultry, or fish each day. A 3-oz serving of meat is about the same size as a deck of cards. One egg equals 1 oz. ? 2 servings of low-fat dairy each day. ? A serving of nuts, seeds, or beans 5 times each week. ? Heart-healthy fats. Healthy fats called Omega-3 fatty acids are found in foods such as flaxseeds and coldwater fish, like sardines, salmon, and mackerel.  Limit how much you eat of the following: ? Canned or prepackaged foods. ? Food that is high in trans fat, such as fried foods. ? Food that is high in saturated fat, such as fatty meat. ? Sweets, desserts, sugary drinks, and other foods with added sugar. ? Full-fat dairy products.  Do not salt foods before eating.  Try to eat at least 2 vegetarian meals each week.  Eat more home-cooked food and less restaurant, buffet, and fast food.  When eating at a restaurant, ask that your food be prepared with less salt or no salt, if possible. What foods are recommended? The items listed may not be a complete list. Talk with your dietitian about   what dietary choices are best for you. Grains Whole-grain or whole-wheat bread. Whole-grain or whole-wheat pasta. Brown rice. Oatmeal. Quinoa. Bulgur. Whole-grain and low-sodium cereals. Pita bread. Low-fat, low-sodium crackers. Whole-wheat flour tortillas. Vegetables Fresh or frozen vegetables (raw, steamed, roasted, or grilled). Low-sodium or reduced-sodium tomato and vegetable juice. Low-sodium or reduced-sodium tomato sauce and tomato paste. Low-sodium or reduced-sodium canned vegetables. Fruits All fresh, dried, or frozen fruit. Canned fruit in natural juice (without  added sugar). Meat and other protein foods Skinless chicken or turkey. Ground chicken or turkey. Pork with fat trimmed off. Fish and seafood. Egg whites. Dried beans, peas, or lentils. Unsalted nuts, nut butters, and seeds. Unsalted canned beans. Lean cuts of beef with fat trimmed off. Low-sodium, lean deli meat. Dairy Low-fat (1%) or fat-free (skim) milk. Fat-free, low-fat, or reduced-fat cheeses. Nonfat, low-sodium ricotta or cottage cheese. Low-fat or nonfat yogurt. Low-fat, low-sodium cheese. Fats and oils Soft margarine without trans fats. Vegetable oil. Low-fat, reduced-fat, or light mayonnaise and salad dressings (reduced-sodium). Canola, safflower, olive, soybean, and sunflower oils. Avocado. Seasoning and other foods Herbs. Spices. Seasoning mixes without salt. Unsalted popcorn and pretzels. Fat-free sweets. What foods are not recommended? The items listed may not be a complete list. Talk with your dietitian about what dietary choices are best for you. Grains Baked goods made with fat, such as croissants, muffins, or some breads. Dry pasta or rice meal packs. Vegetables Creamed or fried vegetables. Vegetables in a cheese sauce. Regular canned vegetables (not low-sodium or reduced-sodium). Regular canned tomato sauce and paste (not low-sodium or reduced-sodium). Regular tomato and vegetable juice (not low-sodium or reduced-sodium). Pickles. Olives. Fruits Canned fruit in a light or heavy syrup. Fried fruit. Fruit in cream or butter sauce. Meat and other protein foods Fatty cuts of meat. Ribs. Fried meat. Bacon. Sausage. Bologna and other processed lunch meats. Salami. Fatback. Hotdogs. Bratwurst. Salted nuts and seeds. Canned beans with added salt. Canned or smoked fish. Whole eggs or egg yolks. Chicken or turkey with skin. Dairy Whole or 2% milk, cream, and half-and-half. Whole or full-fat cream cheese. Whole-fat or sweetened yogurt. Full-fat cheese. Nondairy creamers. Whipped toppings.  Processed cheese and cheese spreads. Fats and oils Butter. Stick margarine. Lard. Shortening. Ghee. Bacon fat. Tropical oils, such as coconut, palm kernel, or palm oil. Seasoning and other foods Salted popcorn and pretzels. Onion salt, garlic salt, seasoned salt, table salt, and sea salt. Worcestershire sauce. Tartar sauce. Barbecue sauce. Teriyaki sauce. Soy sauce, including reduced-sodium. Steak sauce. Canned and packaged gravies. Fish sauce. Oyster sauce. Cocktail sauce. Horseradish that you find on the shelf. Ketchup. Mustard. Meat flavorings and tenderizers. Bouillon cubes. Hot sauce and Tabasco sauce. Premade or packaged marinades. Premade or packaged taco seasonings. Relishes. Regular salad dressings. Where to find more information:  National Heart, Lung, and Blood Institute: www.nhlbi.nih.gov  American Heart Association: www.heart.org Summary  The DASH eating plan is a healthy eating plan that has been shown to reduce high blood pressure (hypertension). It may also reduce your risk for type 2 diabetes, heart disease, and stroke.  With the DASH eating plan, you should limit salt (sodium) intake to 2,300 mg a day. If you have hypertension, you may need to reduce your sodium intake to 1,500 mg a day.  When on the DASH eating plan, aim to eat more fresh fruits and vegetables, whole grains, lean proteins, low-fat dairy, and heart-healthy fats.  Work with your health care provider or diet and nutrition specialist (dietitian) to adjust your eating plan to your   individual calorie needs. This information is not intended to replace advice given to you by your health care provider. Make sure you discuss any questions you have with your health care provider. Document Revised: 11/27/2017 Document Reviewed: 12/08/2016 Elsevier Patient Education  2020 Elsevier Inc. Carbohydrate Counting for Diabetes Mellitus, Adult  Carbohydrate counting is a method of keeping track of how many carbohydrates you  eat. Eating carbohydrates naturally increases the amount of sugar (glucose) in the blood. Counting how many carbohydrates you eat helps keep your blood glucose within normal limits, which helps you manage your diabetes (diabetes mellitus). It is important to know how many carbohydrates you can safely have in each meal. This is different for every person. A diet and nutrition specialist (registered dietitian) can help you make a meal plan and calculate how many carbohydrates you should have at each meal and snack. Carbohydrates are found in the following foods:  Grains, such as breads and cereals.  Dried beans and soy products.  Starchy vegetables, such as potatoes, peas, and corn.  Fruit and fruit juices.  Milk and yogurt.  Sweets and snack foods, such as cake, cookies, candy, chips, and soft drinks. How do I count carbohydrates? There are two ways to count carbohydrates in food. You can use either of the methods or a combination of both. Reading "Nutrition Facts" on packaged food The "Nutrition Facts" list is included on the labels of almost all packaged foods and beverages in the [place] It includes:  The serving size.  Information about nutrients in each serving, including the grams (g) of carbohydrate per serving. To use the "Nutrition Facts":  Decide how many servings you will have.  Multiply the number of servings by the number of carbohydrates per serving.  The resulting number is the total amount of carbohydrates that you will be having. Learning standard serving sizes of other foods When you eat carbohydrate foods that are not packaged or do not include "Nutrition Facts" on the label, you need to measure the servings in order to count the amount of carbohydrates:  Measure the foods that you will eat with a food scale or measuring cup, if needed.  Decide how many standard-size servings you will eat.  Multiply the number of servings by 15. Most carbohydrate-rich foods have  about 15 g of carbohydrates per serving. ? For example, if you eat 8 oz (170 g) of strawberries, you will have eaten 2 servings and 30 g of carbohydrates (2 servings x 15 g = 30 g).  For foods that have more than one food mixed, such as soups and casseroles, you must count the carbohydrates in each food that is included. The following list contains standard serving sizes of common carbohydrate-rich foods. Each of these servings has about 15 g of carbohydrates:   hamburger bun or  English muffin.   oz (15 mL) syrup.   oz (14 g) jelly.  1 slice of bread.  1 six-inch tortilla.  3 oz (85 g) cooked rice or pasta.  4 oz (113 g) cooked dried beans.  4 oz (113 g) starchy vegetable, such as peas, corn, or potatoes.  4 oz (113 g) hot cereal.  4 oz (113 g) mashed potatoes or  of a large baked potato.  4 oz (113 g) canned or frozen fruit.  4 oz (120 mL) fruit juice.  4-6 crackers.  6 chicken nuggets.  6 oz (170 g) unsweetened dry cereal.  6 oz (170 g) plain fat-free yogurt or yogurt sweetened with   artificial sweeteners.  8 oz (240 mL) milk.  8 oz (170 g) fresh fruit or one small piece of fruit.  24 oz (680 g) popped popcorn. Example of carbohydrate counting Sample meal  3 oz (85 g) chicken breast.  6 oz (170 g) brown rice.  4 oz (113 g) corn.  8 oz (240 mL) milk.  8 oz (170 g) strawberries with sugar-free whipped topping. Carbohydrate calculation 1. Identify the foods that contain carbohydrates: ? Rice. ? Corn. ? Milk. ? Strawberries. 2. Calculate how many servings you have of each food: ? 2 servings rice. ? 1 serving corn. ? 1 serving milk. ? 1 serving strawberries. 3. Multiply each number of servings by 15 g: ? 2 servings rice x 15 g = 30 g. ? 1 serving corn x 15 g = 15 g. ? 1 serving milk x 15 g = 15 g. ? 1 serving strawberries x 15 g = 15 g. 4. Add together all of the amounts to find the total grams of carbohydrates eaten: ? 30 g + 15 g + 15 g + 15  g = 75 g of carbohydrates total. Summary  Carbohydrate counting is a method of keeping track of how many carbohydrates you eat.  Eating carbohydrates naturally increases the amount of sugar (glucose) in the blood.  Counting how many carbohydrates you eat helps keep your blood glucose within normal limits, which helps you manage your diabetes.  A diet and nutrition specialist (registered dietitian) can help you make a meal plan and calculate how many carbohydrates you should have at each meal and snack. This information is not intended to replace advice given to you by your health care provider. Make sure you discuss any questions you have with your health care provider. Document Revised: 07/09/2017 Document Reviewed: 05/28/2016 Elsevier Patient Education  2020 Elsevier Inc.  

## 2020-07-17 ENCOUNTER — Other Ambulatory Visit: Payer: Self-pay

## 2020-07-17 ENCOUNTER — Ambulatory Visit: Payer: Self-pay | Admitting: Pharmacy Technician

## 2020-07-17 DIAGNOSIS — Z79899 Other long term (current) drug therapy: Secondary | ICD-10-CM

## 2020-07-17 NOTE — Progress Notes (Signed)
Patient's native language is [place].  Speaks good English but prefers the use of a Spanish Corporate treasurer for medical terminology.  Interpretation provided by Judson Roch, ID# [id number] from [facility].    [facility] application and contract.  Patient agreed to all terms of the [facility] contract.    Patient approved to receive medication assistance until time for re-certification in 4462, and as long as eligibility criteria continues to be met.    Provided patient with Civil engineer, contracting based on his particular needs.    Referred patient for MTM.  [facility]

## 2020-07-18 ENCOUNTER — Other Ambulatory Visit: Payer: Self-pay

## 2020-07-19 ENCOUNTER — Other Ambulatory Visit: Payer: Self-pay

## 2020-07-19 MED ORDER — PANTOPRAZOLE SODIUM 40 MG PO TBEC: 40.0000 mg | DELAYED_RELEASE_TABLET | Freq: Every day | ORAL | 0 refills | Status: DC

## 2020-07-19 MED ORDER — FERROUS SULFATE 325 (65 FE) MG PO TABS: 325.0000 mg | ORAL_TABLET | Freq: Every day | ORAL | 0 refills | Status: DC

## 2020-07-19 MED ORDER — RIVAROXABAN 20 MG PO TABS
20.0000 mg | ORAL_TABLET | Freq: Every day | ORAL | 0 refills | Status: DC
Start: 2020-07-19 — End: 2020-07-24

## 2020-07-19 MED ORDER — SPIRONOLACTONE 100 MG PO TABS: 100.0000 mg | ORAL_TABLET | Freq: Two times a day (BID) | ORAL | 0 refills | Status: DC

## 2020-07-19 MED ORDER — FUROSEMIDE 40 MG PO TABS: 40.0000 mg | ORAL_TABLET | Freq: Every day | ORAL | 0 refills | Status: DC

## 2020-07-24 ENCOUNTER — Inpatient Hospital Stay: Payer: Self-pay

## 2020-07-24 ENCOUNTER — Encounter: Payer: Self-pay | Admitting: Internal Medicine

## 2020-07-24 ENCOUNTER — Other Ambulatory Visit: Payer: Self-pay

## 2020-07-24 ENCOUNTER — Inpatient Hospital Stay: Payer: Self-pay | Attending: Internal Medicine | Admitting: Internal Medicine

## 2020-07-24 DIAGNOSIS — R599 Enlarged lymph nodes, unspecified: Secondary | ICD-10-CM | POA: Insufficient documentation

## 2020-07-24 DIAGNOSIS — I2699 Other pulmonary embolism without acute cor pulmonale: Secondary | ICD-10-CM | POA: Insufficient documentation

## 2020-07-24 DIAGNOSIS — Z7901 Long term (current) use of anticoagulants: Secondary | ICD-10-CM | POA: Insufficient documentation

## 2020-07-24 DIAGNOSIS — Z87891 Personal history of nicotine dependence: Secondary | ICD-10-CM | POA: Insufficient documentation

## 2020-07-24 DIAGNOSIS — F1011 Alcohol abuse, in remission: Secondary | ICD-10-CM | POA: Insufficient documentation

## 2020-07-24 LAB — COMPREHENSIVE METABOLIC PANEL
ALT: 15 U/L (ref 0–44)
AST: 20 U/L (ref 15–41)
Albumin: 3.9 g/dL (ref 3.5–5.0)
Alkaline Phosphatase: 55 U/L (ref 38–126)
Anion gap: 9 (ref 5–15)
BUN: 14 mg/dL (ref 6–20)
CO2: 25 mmol/L (ref 22–32)
Calcium: 9.1 mg/dL (ref 8.9–10.3)
Chloride: 105 mmol/L (ref 98–111)
Creatinine, Ser: 1.08 mg/dL (ref 0.61–1.24)
GFR calc Af Amer: >60 mL/min (ref >60)
GFR calc non Af Amer: >60 mL/min (ref >60)
Glucose, Bld: 124 mg/dL — ABNORMAL HIGH (ref 70–99)
Potassium: 4.1 mmol/L (ref 3.5–5.1)
Sodium: 139 mmol/L (ref 135–145)
Total Bilirubin: 1.1 mg/dL (ref 0.3–1.2)
Total Protein: 7.6 g/dL (ref 6.5–8.1)

## 2020-07-24 LAB — CBC WITH DIFFERENTIAL/PLATELET
Abs Immature Granulocytes: 0.12 10*3/uL — ABNORMAL HIGH (ref 0.00–0.07)
Basophils Absolute: 0.1 10*3/uL (ref 0.0–0.1)
Basophils Relative: 1 %
Eosinophils Absolute: 0.1 10*3/uL (ref 0.0–0.5)
Eosinophils Relative: 1 %
HCT: 31.8 % — ABNORMAL LOW (ref 39.0–52.0)
Hemoglobin: 10.2 g/dL — ABNORMAL LOW (ref 13.0–17.0)
Immature Granulocytes: 2 %
Lymphocytes Relative: 9 %
Lymphs Abs: 0.8 10*3/uL (ref 0.7–4.0)
MCH: 28.8 pg (ref 26.0–34.0)
MCHC: 32.1 g/dL (ref 30.0–36.0)
MCV: 89.8 fL (ref 80.0–100.0)
Monocytes Absolute: 1 10*3/uL (ref 0.1–1.0)
Monocytes Relative: 12 %
Neutro Abs: 6.2 10*3/uL (ref 1.7–7.7)
Neutrophils Relative %: 75 %
Platelets: 305 10*3/uL (ref 150–400)
RBC: 3.54 MIL/uL — ABNORMAL LOW (ref 4.22–5.81)
RDW: 17.5 % — ABNORMAL HIGH (ref 11.5–15.5)
WBC: 8.2 10*3/uL (ref 4.0–10.5)
nRBC: 0 % (ref 0.0–0.2)

## 2020-07-24 MED ORDER — RIVAROXABAN 20 MG PO TABS: 20.0000 mg | ORAL_TABLET | Freq: Every day | ORAL | 3 refills | Status: DC

## 2020-07-24 NOTE — Progress Notes (Signed)
[place] CONSULT NOTE  Patient Care Team: Patient, No Pcp Per as PCP - General (General Practice)  CHIEF COMPLAINTS/PURPOSE OF CONSULTATION: DVT/PE  #  June 16th,2021- Acute bil Segmentl/sub-segmental PE; no   # Mediastinal/Hilar adenopathy; Pneumonia [June, 7893]  # Cirrhosis [normal WBC/platelets; mild anemia] ? Alcohol.   Oncology History   No history exists.   HISTORY OF PRESENTING ILLNESS:  Collin Byrd 59 y.o.  male history of alcoholism/cirrhosis-recent admitted hospital for acute bilateral PE.  June 13, 2020 CT scan showed-also pneumonia/hilar and mediastinal adenopathy.   Denies any long-distance travel.  Denies any immobilization or trauma.  Denies any previous history of DVT PE.  Denies a family history.  Patient was treated with antibiotics and started on anticoagulation and subsequently discharged.  Patient denies any blood in stools or black or stools but denies any nausea vomiting.  Also admits to history of alcohol abuse.  States that quits drinking since discharge.  Also states to have quit smoking since discharge.  Review of Systems  Constitutional: Positive for malaise/fatigue. Negative for chills, diaphoresis, fever and weight loss.  HENT: Negative for nosebleeds and sore throat.   Eyes: Negative for double vision.  Respiratory: Negative for cough, hemoptysis, sputum production, shortness of breath and wheezing.   Cardiovascular: Negative for chest pain, palpitations, orthopnea and leg swelling.  Gastrointestinal: Negative for abdominal pain, blood in stool, constipation, diarrhea, heartburn, melena, nausea and vomiting.  Genitourinary: Negative for dysuria, frequency and urgency.  Musculoskeletal: Negative for back pain and joint pain.  Skin: Negative.  Negative for itching and rash.  Neurological: Negative for dizziness, tingling, focal weakness, weakness and headaches.  Endo/Heme/Allergies: Bruises/bleeds easily.   Psychiatric/Behavioral: Negative for depression. The patient is not nervous/anxious and does not have insomnia.      MEDICAL HISTORY:  Past Medical History:  Diagnosis Date  . Cataract   . CHF (congestive heart failure) ([place])   . Cirrhosis ([place])   . Diabetes mellitus without complication ([place])   . Pulmonary embolus ([place])     SURGICAL HISTORY: Past Surgical History:  Procedure Laterality Date  . CATARACT EXTRACTION      SOCIAL HISTORY: Social History   Socioeconomic History  . Marital status: Married    Spouse name: Not on file  . Number of children: Not on file  . Years of education: Not on file  . Highest education level: Not on file  Occupational History  . Not on file  Tobacco Use  . Smoking status: Former Smoker    Packs/day: 0.25    Types: Cigarettes  . Smokeless tobacco: Never Used  Vaping Use  . Vaping Use: Never used  Substance and Sexual Activity  . Alcohol use: Not Currently  . Drug use: Never  . Sexual activity: Not Currently  Other Topics Concern  . Not on file  Social History Narrative  . Not on file   Social Determinants of Health   Financial Resource Strain:   . Difficulty of Paying Living Expenses:   Food Insecurity:   . Worried About Charity fundraiser in the Last Year:   . Arboriculturist in the Last Year:   Transportation Needs:   . Film/video editor (Medical):   Marland Kitchen Lack of Transportation (Non-Medical):   Physical Activity:   . Days of Exercise per Week:   . Minutes of Exercise per Session:   Stress:   . Feeling of Stress :   Social Connections:   . Frequency of  Communication with Friends and Family:   . Frequency of Social Gatherings with Friends and Family:   . Attends Religious Services:   . Active Member of Clubs or Organizations:   . Attends Archivist Meetings:   Marland Kitchen Marital Status:   Intimate Partner Violence:   . Fear of Current or Ex-Partner:   . Emotionally Abused:   Marland Kitchen Physically Abused:   . Sexually  Abused:     FAMILY HISTORY: Family History  Problem Relation Age of Onset  . Heart disease Neg Hx     ALLERGIES:  has No Known Allergies.  MEDICATIONS:  Current Outpatient Medications  Medication Sig Dispense Refill  . blood glucose meter kit and supplies KIT Dispense based on patient and insurance preference. Use up to four times daily as directed. (FOR ICD-9 250.00, 250.01). 1 each 0  . ferrous sulfate 325 (65 FE) MG tablet Take 1 tablet (325 mg total) by mouth daily with breakfast. 30 tablet 0  . folic acid (FOLVITE) 1 MG tablet Take 1 tablet (1 mg total) by mouth daily. 30 tablet 0  . furosemide (LASIX) 40 MG tablet Take 1 tablet (40 mg total) by mouth daily. 30 tablet 0  . glimepiride (AMARYL) 2 MG tablet Take 1 tablet (2 mg total) by mouth every morning. 30 tablet 0  . pantoprazole (PROTONIX) 40 MG tablet Take 1 tablet (40 mg total) by mouth daily. 30 tablet 0  . rivaroxaban (XARELTO) 20 MG TABS tablet Take 1 tablet (20 mg total) by mouth daily with supper. 30 tablet 3  . spironolactone (ALDACTONE) 100 MG tablet Take 1 tablet (100 mg total) by mouth 2 (two) times daily. 60 tablet 0  . thiamine 100 MG tablet Take 1 tablet (100 mg total) by mouth daily. 30 tablet 0   No current facility-administered medications for this visit.      Marland Kitchen  PHYSICAL EXAMINATION:  Vitals:   07/24/20 1133  BP: 106/70  Pulse: 100  Resp: 16  Temp: 99 F (37.2 C)  SpO2: 99%   Filed Weights   07/24/20 1133  Weight: 180 lb 12.8 oz (82 kg)    Physical Exam HENT:     Head: Normocephalic and atraumatic.     Mouth/Throat:     Pharynx: No oropharyngeal exudate.  Eyes:     Pupils: Pupils are equal, round, and reactive to light.  Cardiovascular:     Rate and Rhythm: Normal rate and regular rhythm.  Pulmonary:     Effort: No respiratory distress.     Breath sounds: No wheezing.  Abdominal:     General: Bowel sounds are normal. There is no distension.     Palpations: Abdomen is soft. There  is no mass.     Tenderness: There is no abdominal tenderness. There is no guarding or rebound.  Musculoskeletal:        General: No tenderness. Normal range of motion.     Cervical back: Normal range of motion and neck supple.  Skin:    General: Skin is warm.  Neurological:     Mental Status: He is alert and oriented to person, place, and time.  Psychiatric:        Mood and Affect: Affect normal.      LABORATORY DATA:  I have reviewed the data as listed Lab Results  Component Value Date   WBC 8.2 07/24/2020   HGB 10.2 (L) 07/24/2020   HCT 31.8 (L) 07/24/2020   MCV 89.8 07/24/2020   PLT  305 07/24/2020   Recent Labs    06/05/20 0933 06/06/20 0106 06/07/20 0814 06/07/20 0814 06/08/20 0129 06/08/20 1515 06/12/20 0424 06/13/20 0416 06/14/20 0419 06/15/20 0415 06/20/20 0607 06/21/20 0448 07/24/20 1215  NA 129*   < > 133*   < > 136   < > 135   < > 136   < > 133* 133* 139  K 4.0   < > 3.9   < > 2.7*   < > 4.5   < > 4.2   < > 4.4 3.8 4.1  CL 97*   < > 97*   < > 94*   < > 103   < > 102   < > 99 98 105  CO2 25   < > 30   < > 33*   < > 25   < > 30   < > 24 26 25   GLUCOSE 183*   < > 142*   < > 167*   < > 330*   < > 152*   < > 247* 184* 124*  BUN 9   < > 8   < > 9   < > 20   < > 16   < > 21* 22* 14  CREATININE 0.83   < > 0.54*   < > 0.56*   < > 0.54*   < > 0.47*   < > 0.74 0.79 1.08  CALCIUM 6.8*   < > 7.0*   < > 6.6*   < > 8.1*   < > 8.0*   < > 9.0 8.8* 9.1  GFRNONAA >60   < > >60   < > >60   < > >60   < > >60   < > >60 >60 >60  GFRAA >60   < > >60   < > >60   < > >60   < > >60   < > >60 >60 >60  PROT 5.7*  --  5.2*  --  5.3*  --   --   --   --   --   --   --  7.6  ALBUMIN 1.7*  1.7*  --  1.7*   < > 2.4*   < > 3.0*  --  3.0*  --   --   --  3.9  AST 25  --  22  --  17  --   --   --   --   --   --   --  20  ALT 22  --  20  --  18  --   --   --   --   --   --   --  15  ALKPHOS 59  --  50  --  41  --   --   --   --   --   --   --  55  BILITOT 1.0  --  0.9  --  0.6  --   --   --    --   --   --   --  1.1  BILIDIR 0.2  --   --   --   --   --   --   --   --   --   --   --   --   IBILI 0.8  --   --   --   --   --   --   --   --   --   --   --   --    < > =  values in this interval not displayed.    RADIOGRAPHIC STUDIES: I have personally reviewed the radiological images as listed and agreed with the findings in the report. No results found.  ASSESSMENT & PLAN:   Acute pulmonary embolus ([place]) #June 2021 -acute bil pul embolism-unprovoked.  Etiology is unclear.  Recommend hypercoagulable work-up after anticoagulation is discontinued.  Continue Eliquis 6 months to 1 year.  #June 2021-pneumonia/hilar and mediastinal adenopathy.  Would recommend repeat CT scan in 3 months to evaluate for resolution of the pneumonia/lymphadenopathy.  # cirrhosis: ? Alcohol- quit since June 2021.  Patient will need GI evaluation.  # Smoking-quit smoking June 2021.  Thank you,  for allowing me to participate in the care of your pleasant patient. Please do not hesitate to contact me with questions or concerns in the interim.  # DISPOSITION:  # labs- today- order cbc/cmp/ # follow up in 3 months- MD; labs- cbc/bmp-Dr.B  # I reviewed the blood work- with the patient in detail; also reviewed the imaging independently [as summarized above]; and with the patient in detail.    All questions were answered. The patient knows to call the clinic with any problems, questions or concerns.   Cammie Sickle, MD 08/05/2020 7:41 PM

## 2020-07-24 NOTE — Assessment & Plan Note (Addendum)
#  June 2021 -acute bil pul embolism-unprovoked.  Etiology is unclear.  Recommend hypercoagulable work-up after anticoagulation is discontinued.  Continue Eliquis 6 months to 1 year.  #June 2021-pneumonia/hilar and mediastinal adenopathy.  Would recommend repeat CT scan in 3 months to evaluate for resolution of the pneumonia/lymphadenopathy.  # cirrhosis: ? Alcohol- quit since June 2021.  Patient will need GI evaluation.  # Smoking-quit smoking June 2021.  Thank you,  for allowing me to participate in the care of your pleasant patient. Please do not hesitate to contact me with questions or concerns in the interim.  # DISPOSITION:  # labs- today- order cbc/cmp/ # follow up in 3 months- MD; labs- cbc/bmp-Dr.B  # I reviewed the blood work- with the patient in detail; also reviewed the imaging independently [as summarized above]; and with the patient in detail.

## 2020-08-01 ENCOUNTER — Ambulatory Visit: Payer: Self-pay | Attending: Family | Admitting: Family

## 2020-08-01 ENCOUNTER — Encounter: Payer: Self-pay | Admitting: Family

## 2020-08-01 ENCOUNTER — Other Ambulatory Visit: Payer: Self-pay

## 2020-08-01 VITALS — BP 127/79 | HR 103 | Resp 20 | Ht 69.0 in | Wt 185.0 lb

## 2020-08-01 DIAGNOSIS — E119 Type 2 diabetes mellitus without complications: Secondary | ICD-10-CM

## 2020-08-01 DIAGNOSIS — Z7901 Long term (current) use of anticoagulants: Secondary | ICD-10-CM | POA: Insufficient documentation

## 2020-08-01 DIAGNOSIS — Z87891 Personal history of nicotine dependence: Secondary | ICD-10-CM | POA: Insufficient documentation

## 2020-08-01 DIAGNOSIS — K7030 Alcoholic cirrhosis of liver without ascites: Secondary | ICD-10-CM | POA: Insufficient documentation

## 2020-08-01 DIAGNOSIS — I2699 Other pulmonary embolism without acute cor pulmonale: Secondary | ICD-10-CM | POA: Insufficient documentation

## 2020-08-01 DIAGNOSIS — E1136 Type 2 diabetes mellitus with diabetic cataract: Secondary | ICD-10-CM | POA: Insufficient documentation

## 2020-08-01 DIAGNOSIS — I2782 Chronic pulmonary embolism: Secondary | ICD-10-CM

## 2020-08-01 DIAGNOSIS — I5032 Chronic diastolic (congestive) heart failure: Secondary | ICD-10-CM | POA: Insufficient documentation

## 2020-08-01 DIAGNOSIS — K7031 Alcoholic cirrhosis of liver with ascites: Secondary | ICD-10-CM

## 2020-08-01 DIAGNOSIS — Z7984 Long term (current) use of oral hypoglycemic drugs: Secondary | ICD-10-CM | POA: Insufficient documentation

## 2020-08-01 DIAGNOSIS — Z79899 Other long term (current) drug therapy: Secondary | ICD-10-CM | POA: Insufficient documentation

## 2020-08-01 NOTE — Progress Notes (Signed)
Patient ID: Collin Byrd, male    DOB: May 25, 1961, 59 y.o.   MRN: [id number]  HPI  Mr Pieczynski is a 59 y/o male with a history of DM, cataract, PE, cirrhosis, previous tobacco/ alcohol use and chronic heart failure.   Echo report from 06/05/20 reviewed and showed an EF of >55% along with moderate LVH.   Admitted 06/05/20 due to shortness of breath due to acute PE. Cardiology, nephrology, GI and palliative care consults obtained. Needed oxygen but able to be weaned completely off of it. Paracentesis done with removal of 2.5L. Placed on lasix and aldactone. Antibiotic course completed due to clinical sepsis. Discharged after 16 days.   He presents today with a chief complaint of an initial visit. He does reports some difficulty sleeping at times and some easy bruising along with this. He denies any dizziness, abdominal distention, palpitations, pedal edema, chest pain, shortness of breath, cough or fatigue.   He hasn't been weighing himself as he says that he doesn't have any scales.  Past Medical History:  Diagnosis Date  . Cataract   . CHF (congestive heart failure) ([place])   . Cirrhosis ([place])   . Diabetes mellitus without complication ([place])   . Pulmonary embolus [facility])    Past Surgical History:  Procedure Laterality Date  . CATARACT EXTRACTION     Family History  Problem Relation Age of Onset  . Heart disease Neg Hx    Social History   Tobacco Use  . Smoking status: Former Smoker    Packs/day: 0.25    Types: Cigarettes  . Smokeless tobacco: Never Used  Substance Use Topics  . Alcohol use: Not Currently   No Known Allergies Prior to Admission medications   Medication Sig Start Date End Date Taking? Authorizing Provider  blood glucose meter kit and supplies KIT Dispense based on patient and insurance preference. Use up to four times daily as directed. (FOR ICD-9 250.00, 250.01). 07/12/20  Yes Iloabachie, Chioma E, NP  ferrous sulfate 325 (65 FE) MG tablet Take 1 tablet (325 mg  total) by mouth daily with breakfast. 07/19/20  Yes Iloabachie, Chioma E, NP  folic acid (FOLVITE) 1 MG tablet Take 1 tablet (1 mg total) by mouth daily. 07/12/20  Yes Iloabachie, Chioma E, NP  furosemide (LASIX) 40 MG tablet Take 1 tablet (40 mg total) by mouth daily. 07/19/20  Yes Iloabachie, Chioma E, NP  glimepiride (AMARYL) 2 MG tablet Take 1 tablet (2 mg total) by mouth every morning. 07/12/20 07/12/21 Yes Iloabachie, Chioma E, NP  pantoprazole (PROTONIX) 40 MG tablet Take 1 tablet (40 mg total) by mouth daily. 07/19/20 07/19/21 Yes Iloabachie, Chioma E, NP  rivaroxaban (XARELTO) 20 MG TABS tablet Take 1 tablet (20 mg total) by mouth daily with supper. 07/24/20 11/21/20 Yes Cammie Sickle, MD  spironolactone (ALDACTONE) 100 MG tablet Take 1 tablet (100 mg total) by mouth 2 (two) times daily. 07/19/20  Yes Iloabachie, Chioma E, NP  thiamine 100 MG tablet Take 1 tablet (100 mg total) by mouth daily. 07/12/20  Yes Iloabachie, Chioma E, NP    Review of Systems  Constitutional: Negative for appetite change and fatigue.  HENT: Negative for congestion, postnasal drip and sore throat.   Eyes: Negative.   Respiratory: Negative for cough, chest tightness and shortness of breath.   Cardiovascular: Negative for chest pain, palpitations and leg swelling.  Gastrointestinal: Negative for abdominal distention and abdominal pain.  Endocrine: Negative.   Genitourinary: Negative.   Musculoskeletal: Negative for back  pain and neck pain.  Skin: Negative.   Allergic/Immunologic: Negative.   Neurological: Negative for dizziness and light-headedness.  Hematological: Negative for adenopathy. Bruises/bleeds easily.  Psychiatric/Behavioral: Positive for sleep disturbance (sleeping on 1 pillow). Negative for dysphoric mood. The patient is not nervous/anxious.     Vitals:   08/01/20 1105  BP: 127/79  Pulse: (!) 103  Resp: 20  SpO2: 98%  Weight: 185 lb (83.9 kg)  Height: 5' 9"  (1.753 m)   Wt Readings from  Last 3 Encounters:  08/01/20 185 lb (83.9 kg)  07/24/20 180 lb 12.8 oz (82 kg)  07/12/20 176 lb (79.8 kg)   Lab Results  Component Value Date   CREATININE 1.08 07/24/2020   CREATININE 0.79 06/21/2020   CREATININE 0.74 06/20/2020    Physical Exam Vitals and nursing note reviewed.  Constitutional:      Appearance: Normal appearance.  HENT:     Head: Normocephalic and atraumatic.  Cardiovascular:     Rate and Rhythm: Regular rhythm. Tachycardia present.  Pulmonary:     Effort: Pulmonary effort is normal. No respiratory distress.     Breath sounds: No wheezing or rales.  Abdominal:     General: There is no distension.     Palpations: Abdomen is soft.     Tenderness: There is no abdominal tenderness.  Musculoskeletal:        General: No tenderness.     Cervical back: Normal range of motion.     Right lower leg: No edema.     Left lower leg: No edema.  Skin:    General: Skin is warm and dry.  Neurological:     General: No focal deficit present.     Mental Status: He is alert and oriented to person, place, and time.  Psychiatric:        Mood and Affect: Mood normal.        Behavior: Behavior normal.        Thought Content: Thought content normal.    Assessment & Plan:  1: Chronic heart failure with preserved ejection fraction with structural changes (LVH)- - NYHA class I - euvolemic today - scales given and he was instructed to weigh daily and call for an overnight weight gain of >2 pounds or a weekly weight gain of >5 pounds - not adding salt to his food and dietary information was given to him about this - consider adding entresto at his next visit - may need beta blocker therapy if he remains tachycardic - BMP 07/24/20 reviewed and showed sodium 139, potassium 4.1, creatinine 1.08 and GFR >60 - BNP 06/05/20 was 155.9  2: DM- - saw PCP @ [facility] 07/12/20 - glucose at home today was 139 - A1c 06/06/20 was 9.9%  3: Alcoholic cirrhosis- - paracentesis done  06/06/20 with removal of 2.5L - says that he hasn't had any alcohol since hospitalization  4: PE- - saw hematology Rogue Bussing) 07/24/20 - taking xarelto    Patient did not bring his medications nor a list. Each medication was verbally reviewed with the patient and he was encouraged to bring the bottles to every visit to confirm accuracy of list.  Return in 2 months or sooner for any questions/problems before then.

## 2020-08-01 NOTE — Patient Instructions (Addendum)
Start weighing daily and call for an overnight weight gain of > 2 pounds or a weekly weight gain of >5 pounds.  

## 2020-08-09 ENCOUNTER — Ambulatory Visit: Payer: Self-pay | Admitting: Gerontology

## 2020-08-09 VITALS — BP 111/69 | HR 91 | Temp 97.8°F | Resp 16 | Wt 187.2 lb

## 2020-08-09 DIAGNOSIS — E1165 Type 2 diabetes mellitus with hyperglycemia: Secondary | ICD-10-CM

## 2020-08-09 DIAGNOSIS — Z8679 Personal history of other diseases of the circulatory system: Secondary | ICD-10-CM

## 2020-08-09 MED ORDER — SPIRONOLACTONE 100 MG PO TABS: 100.0000 mg | ORAL_TABLET | Freq: Two times a day (BID) | ORAL | 2 refills | Status: DC

## 2020-08-09 MED ORDER — FUROSEMIDE 40 MG PO TABS: 40.0000 mg | ORAL_TABLET | Freq: Every day | ORAL | 2 refills | Status: DC

## 2020-08-09 MED ORDER — GLIMEPIRIDE 2 MG PO TABS: 2.0000 mg | ORAL_TABLET | ORAL | 2 refills | Status: DC

## 2020-08-09 NOTE — Progress Notes (Signed)
Established Patient Office Visit  Subjective:  Patient ID: Collin Byrd, male    DOB: September 23, 1961  Age: 59 y.o. MRN: [id number]  CC: No chief complaint on file.   HPI Collin Byrd is 59 y.o male who presents for follow up of Type 2 diabetes and medication refill. He reports being compliant with his treatment regimen and states that he checks his blood glucose twice daily. His HgbA1C done on 06/06/2020 was 7.5%, and his fasting blood glucose reading this morning was 136 mg/dL. He denies experiencing peripheral neuropathy, hypo or hyperglycemic symptoms. He has a history of alcoholic liver cirrhosis and acute pulmonary and was seen by Dr. Lenetta Quaker on 07/24/2020. He was advised to continue Eliquis for about 6 months to 1 year. He denies alcoholic intake, right upper quadrant abdominal pain or jaundiced.   He was seen by Darylene Price NP, for CHF 08/01/2020 during which he was instructed to weigh himself daily and report a weekly weight gain of > 5 pounds. He verbalized weighing himself daily and states that he has been keeping a watchful eye on his weight. Overall, he states that he is doing well and offers no further complaint at this time.  Past Medical History:  Diagnosis Date  . Cataract   . CHF (congestive heart failure) ([place])   . Cirrhosis ([place])   . Diabetes mellitus without complication ([place])   . Pulmonary embolus [facility])     Past Surgical History:  Procedure Laterality Date  . CATARACT EXTRACTION      Family History  Problem Relation Age of Onset  . Heart disease Neg Hx     Social History   Socioeconomic History  . Marital status: Married    Spouse name: Not on file  . Number of children: Not on file  . Years of education: Not on file  . Highest education level: Not on file  Occupational History  . Not on file  Tobacco Use  . Smoking status: Former Smoker    Packs/day: 0.25    Types: Cigarettes  . Smokeless tobacco: Never Used  Vaping Use  . Vaping Use: Never  used  Substance and Sexual Activity  . Alcohol use: Not Currently  . Drug use: Never  . Sexual activity: Not Currently  Other Topics Concern  . Not on file  Social History Narrative  . Not on file   Social Determinants of Health   Financial Resource Strain:   . Difficulty of Paying Living Expenses:   Food Insecurity:   . Worried About Charity fundraiser in the Last Year:   . Arboriculturist in the Last Year:   Transportation Needs:   . Film/video editor (Medical):   Marland Kitchen Lack of Transportation (Non-Medical):   Physical Activity:   . Days of Exercise per Week:   . Minutes of Exercise per Session:   Stress:   . Feeling of Stress :   Social Connections:   . Frequency of Communication with Friends and Family:   . Frequency of Social Gatherings with Friends and Family:   . Attends Religious Services:   . Active Member of Clubs or Organizations:   . Attends Archivist Meetings:   Marland Kitchen Marital Status:   Intimate Partner Violence:   . Fear of Current or Ex-Partner:   . Emotionally Abused:   Marland Kitchen Physically Abused:   . Sexually Abused:     Outpatient Medications Prior to Visit  Medication Sig Dispense Refill  . blood glucose  meter kit and supplies KIT Dispense based on patient and insurance preference. Use up to four times daily as directed. (FOR ICD-9 250.00, 250.01). 1 each 0  . ferrous sulfate 325 (65 FE) MG tablet Take 1 tablet (325 mg total) by mouth daily with breakfast. 30 tablet 0  . folic acid (FOLVITE) 1 MG tablet Take 1 tablet (1 mg total) by mouth daily. 30 tablet 0  . pantoprazole (PROTONIX) 40 MG tablet Take 1 tablet (40 mg total) by mouth daily. 30 tablet 0  . rivaroxaban (XARELTO) 20 MG TABS tablet Take 1 tablet (20 mg total) by mouth daily with supper. 30 tablet 3  . thiamine 100 MG tablet Take 1 tablet (100 mg total) by mouth daily. 30 tablet 0  . furosemide (LASIX) 40 MG tablet Take 1 tablet (40 mg total) by mouth daily. 30 tablet 0  . glimepiride  (AMARYL) 2 MG tablet Take 1 tablet (2 mg total) by mouth every morning. 30 tablet 0  . spironolactone (ALDACTONE) 100 MG tablet Take 1 tablet (100 mg total) by mouth 2 (two) times daily. 60 tablet 0   No facility-administered medications prior to visit.    No Known Allergies  ROS Review of Systems  Constitutional: Negative.   Respiratory: Negative.   Cardiovascular: Negative.   Gastrointestinal: Negative.   Endocrine: Negative.   Genitourinary: Negative.   Musculoskeletal: Negative.   Allergic/Immunologic: Negative.   Neurological: Negative.   Hematological: Negative.   Psychiatric/Behavioral: Negative.       Objective:    Physical Exam Constitutional:      Appearance: Normal appearance.  HENT:     Head: Normocephalic and atraumatic.  Cardiovascular:     Rate and Rhythm: Normal rate and regular rhythm.     Pulses: Normal pulses.     Heart sounds: Normal heart sounds.  Pulmonary:     Effort: Pulmonary effort is normal.     Breath sounds: Normal breath sounds.  Neurological:     General: No focal deficit present.     Mental Status: He is alert and oriented to person, place, and time.  Psychiatric:        Mood and Affect: Mood normal.        Behavior: Behavior normal.        Thought Content: Thought content normal.        Judgment: Judgment normal.     BP 111/69 (BP Location: Left Arm, Patient Position: Sitting)   Pulse 91   Temp 97.8 F (36.6 C)   Resp 16   Wt 187 lb 3.2 oz (84.9 kg)   SpO2 99%   BMI 27.64 kg/m  Wt Readings from Last 3 Encounters:  08/09/20 187 lb 3.2 oz (84.9 kg)  08/01/20 185 lb (83.9 kg)  07/24/20 180 lb 12.8 oz (82 kg)     Health Maintenance Due  Topic Date Due  . PNEUMOCOCCAL POLYSACCHARIDE VACCINE AGE 62-64 HIGH RISK  Never done  . FOOT EXAM  Never done  . OPHTHALMOLOGY EXAM  Never done  . URINE MICROALBUMIN  Never done  . COVID-19 Vaccine (1) Never done  . TETANUS/TDAP  Never done  . COLONOSCOPY  Never done  . INFLUENZA  VACCINE  07/29/2020    There are no preventive care reminders to display for this patient.  Lab Results  Component Value Date   TSH 0.773 06/05/2020   Lab Results  Component Value Date   WBC 8.2 07/24/2020   HGB 10.2 (L) 07/24/2020   HCT  31.8 (L) 07/24/2020   MCV 89.8 07/24/2020   PLT 305 07/24/2020   Lab Results  Component Value Date   NA 139 07/24/2020   K 4.1 07/24/2020   CO2 25 07/24/2020   GLUCOSE 124 (H) 07/24/2020   BUN 14 07/24/2020   CREATININE 1.08 07/24/2020   BILITOT 1.1 07/24/2020   ALKPHOS 55 07/24/2020   AST 20 07/24/2020   ALT 15 07/24/2020   PROT 7.6 07/24/2020   ALBUMIN 3.9 07/24/2020   CALCIUM 9.1 07/24/2020   ANIONGAP 9 07/24/2020   Lab Results  Component Value Date   CHOL 83 06/06/2020   Lab Results  Component Value Date   HDL 22 (L) 06/06/2020   Lab Results  Component Value Date   LDLCALC 52 06/06/2020   Lab Results  Component Value Date   TRIG 47 06/06/2020   Lab Results  Component Value Date   CHOLHDL 3.8 06/06/2020   Lab Results  Component Value Date   HGBA1C 7.5 (H) 06/06/2020      Assessment & Plan:   1. Type 2 diabetes mellitus with hyperglycemia, without long-term current use of insulin (HCC) His HgbA1C was 7.5%, his goal should be <7%. His daily blood glucose reading from his log is improving, he was advised to continue current treatment regimen, check his blood glucose every morning and evening, and bring log to next appointment. He was advised to continue low carb/concentrated  Sweet diet and exercise as tolerated. - glimepiride (AMARYL) 2 MG tablet; Take 1 tablet (2 mg total) by mouth every morning.  Dispense: 30 tablet; Refill: 2 - HgB A1c; Future    2. History of congestive heart failure He will continue current treatment regimen and follow-up with Darylene Price, NP - spironolactone (ALDACTONE) 100 MG tablet; Take 1 tablet (100 mg total) by mouth 2 (two) times daily.  Dispense: 60 tablet; Refill: 2 - furosemide  (LASIX) 40 MG tablet; Take 1 tablet (40 mg total) by mouth daily.  Dispense: 30 tablet; Refill: 2 He was advised to record daily weight and notify provider of weekly weight gain >5 pounds.     Follow-up: Return in about 6 weeks (around 09/19/2020), or if symptoms worsen or fail to improve.    Carney Corners, RN

## 2020-08-09 NOTE — Patient Instructions (Signed)
Carbohydrate Counting for Diabetes Mellitus, Adult  Carbohydrate counting is a method of keeping track of how many carbohydrates you eat. Eating carbohydrates naturally increases the amount of sugar (glucose) in the blood. Counting how many carbohydrates you eat helps keep your blood glucose within normal limits, which helps you manage your diabetes (diabetes mellitus). It is important to know how many carbohydrates you can safely have in each meal. This is different for every person. A diet and nutrition specialist (registered dietitian) can help you make a meal plan and calculate how many carbohydrates you should have at each meal and snack. Carbohydrates are found in the following foods:  Grains, such as breads and cereals.  Dried beans and soy products.  Starchy vegetables, such as potatoes, peas, and corn.  Fruit and fruit juices.  Milk and yogurt.  Sweets and snack foods, such as cake, cookies, candy, chips, and soft drinks. How do I count carbohydrates? There are two ways to count carbohydrates in food. You can use either of the methods or a combination of both. Reading "Nutrition Facts" on packaged food The "Nutrition Facts" list is included on the labels of almost all packaged foods and beverages in the [place] It includes:  The serving size.  Information about nutrients in each serving, including the grams (g) of carbohydrate per serving. To use the "Nutrition Facts":  Decide how many servings you will have.  Multiply the number of servings by the number of carbohydrates per serving.  The resulting number is the total amount of carbohydrates that you will be having. Learning standard serving sizes of other foods When you eat carbohydrate foods that are not packaged or do not include "Nutrition Facts" on the label, you need to measure the servings in order to count the amount of carbohydrates:  Measure the foods that you will eat with a food scale or measuring cup, if needed.   Decide how many standard-size servings you will eat.  Multiply the number of servings by 15. Most carbohydrate-rich foods have about 15 g of carbohydrates per serving. ? For example, if you eat 8 oz (170 g) of strawberries, you will have eaten 2 servings and 30 g of carbohydrates (2 servings x 15 g = 30 g).  For foods that have more than one food mixed, such as soups and casseroles, you must count the carbohydrates in each food that is included. The following list contains standard serving sizes of common carbohydrate-rich foods. Each of these servings has about 15 g of carbohydrates:   hamburger bun or  English muffin.   oz (15 mL) syrup.   oz (14 g) jelly.  1 slice of bread.  1 six-inch tortilla.  3 oz (85 g) cooked rice or pasta.  4 oz (113 g) cooked dried beans.  4 oz (113 g) starchy vegetable, such as peas, corn, or potatoes.  4 oz (113 g) hot cereal.  4 oz (113 g) mashed potatoes or  of a large baked potato.  4 oz (113 g) canned or frozen fruit.  4 oz (120 mL) fruit juice.  4-6 crackers.  6 chicken nuggets.  6 oz (170 g) unsweetened dry cereal.  6 oz (170 g) plain fat-free yogurt or yogurt sweetened with artificial sweeteners.  8 oz (240 mL) milk.  8 oz (170 g) fresh fruit or one small piece of fruit.  24 oz (680 g) popped popcorn. Example of carbohydrate counting Sample meal  3 oz (85 g) chicken breast.  6 oz (170 g)   brown rice.  4 oz (113 g) corn.  8 oz (240 mL) milk.  8 oz (170 g) strawberries with sugar-free whipped topping. Carbohydrate calculation 1. Identify the foods that contain carbohydrates: ? Rice. ? Corn. ? Milk. ? Strawberries. 2. Calculate how many servings you have of each food: ? 2 servings rice. ? 1 serving corn. ? 1 serving milk. ? 1 serving strawberries. 3. Multiply each number of servings by 15 g: ? 2 servings rice x 15 g = 30 g. ? 1 serving corn x 15 g = 15 g. ? 1 serving milk x 15 g = 15 g. ? 1 serving  strawberries x 15 g = 15 g. 4. Add together all of the amounts to find the total grams of carbohydrates eaten: ? 30 g + 15 g + 15 g + 15 g = 75 g of carbohydrates total. Summary  Carbohydrate counting is a method of keeping track of how many carbohydrates you eat.  Eating carbohydrates naturally increases the amount of sugar (glucose) in the blood.  Counting how many carbohydrates you eat helps keep your blood glucose within normal limits, which helps you manage your diabetes.  A diet and nutrition specialist (registered dietitian) can help you make a meal plan and calculate how many carbohydrates you should have at each meal and snack. This information is not intended to replace advice given to you by your health care provider. Make sure you discuss any questions you have with your health care provider. Document Revised: 07/09/2017 Document Reviewed: 05/28/2016 Elsevier Patient Education  2020 [employer] DASH Eating Plan DASH stands for "Dietary Approaches to Stop Hypertension." The DASH eating plan is a healthy eating plan that has been shown to reduce high blood pressure (hypertension). It may also reduce your risk for type 2 diabetes, heart disease, and stroke. The DASH eating plan may also help with weight loss. What are tips for following this plan?  General guidelines  Avoid eating more than 2,300 mg (milligrams) of salt (sodium) a day. If you have hypertension, you may need to reduce your sodium intake to 1,500 mg a day.  Limit alcohol intake to no more than 1 drink a day for nonpregnant women and 2 drinks a day for men. One drink equals 12 oz of beer, 5 oz of wine, or 1 oz of hard liquor.  Work with your health care provider to maintain a healthy body weight or to lose weight. Ask what an ideal weight is for you.  Get at least 30 minutes of exercise that causes your heart to beat faster (aerobic exercise) most days of the week. Activities may include walking, swimming, or  biking.  Work with your health care provider or diet and nutrition specialist (dietitian) to adjust your eating plan to your individual calorie needs. Reading food labels   Check food labels for the amount of sodium per serving. Choose foods with less than 5 percent of the Daily Value of sodium. Generally, foods with less than 300 mg of sodium per serving fit into this eating plan.  To find whole grains, look for the word "whole" as the first word in the ingredient list. Shopping  Buy products labeled as "low-sodium" or "no salt added."  Buy fresh foods. Avoid canned foods and premade or frozen meals. Cooking  Avoid adding salt when cooking. Use salt-free seasonings or herbs instead of table salt or sea salt. Check with your health care provider or pharmacist before using salt substitutes.  Do not   fry foods. Cook foods using healthy methods such as baking, boiling, grilling, and broiling instead.  Cook with heart-healthy oils, such as olive, canola, soybean, or sunflower oil. Meal planning  Eat a balanced diet that includes: ? 5 or more servings of fruits and vegetables each day. At each meal, try to fill half of your plate with fruits and vegetables. ? Up to 6-8 servings of whole grains each day. ? Less than 6 oz of lean meat, poultry, or fish each day. A 3-oz serving of meat is about the same size as a deck of cards. One egg equals 1 oz. ? 2 servings of low-fat dairy each day. ? A serving of nuts, seeds, or beans 5 times each week. ? Heart-healthy fats. Healthy fats called Omega-3 fatty acids are found in foods such as flaxseeds and coldwater fish, like sardines, salmon, and mackerel.  Limit how much you eat of the following: ? Canned or prepackaged foods. ? Food that is high in trans fat, such as fried foods. ? Food that is high in saturated fat, such as fatty meat. ? Sweets, desserts, sugary drinks, and other foods with added sugar. ? Full-fat dairy products.  Do not salt  foods before eating.  Try to eat at least 2 vegetarian meals each week.  Eat more home-cooked food and less restaurant, buffet, and fast food.  When eating at a restaurant, ask that your food be prepared with less salt or no salt, if possible. What foods are recommended? The items listed may not be a complete list. Talk with your dietitian about what dietary choices are best for you. Grains Whole-grain or whole-wheat bread. Whole-grain or whole-wheat pasta. Brown rice. Oatmeal. Quinoa. Bulgur. Whole-grain and low-sodium cereals. Pita bread. Low-fat, low-sodium crackers. Whole-wheat flour tortillas. Vegetables Fresh or frozen vegetables (raw, steamed, roasted, or grilled). Low-sodium or reduced-sodium tomato and vegetable juice. Low-sodium or reduced-sodium tomato sauce and tomato paste. Low-sodium or reduced-sodium canned vegetables. Fruits All fresh, dried, or frozen fruit. Canned fruit in natural juice (without added sugar). Meat and other protein foods Skinless chicken or turkey. Ground chicken or turkey. Pork with fat trimmed off. Fish and seafood. Egg whites. Dried beans, peas, or lentils. Unsalted nuts, nut butters, and seeds. Unsalted canned beans. Lean cuts of beef with fat trimmed off. Low-sodium, lean deli meat. Dairy Low-fat (1%) or fat-free (skim) milk. Fat-free, low-fat, or reduced-fat cheeses. Nonfat, low-sodium ricotta or cottage cheese. Low-fat or nonfat yogurt. Low-fat, low-sodium cheese. Fats and oils Soft margarine without trans fats. Vegetable oil. Low-fat, reduced-fat, or light mayonnaise and salad dressings (reduced-sodium). Canola, safflower, olive, soybean, and sunflower oils. Avocado. Seasoning and other foods Herbs. Spices. Seasoning mixes without salt. Unsalted popcorn and pretzels. Fat-free sweets. What foods are not recommended? The items listed may not be a complete list. Talk with your dietitian about what dietary choices are best for you. Grains Baked goods  made with fat, such as croissants, muffins, or some breads. Dry pasta or rice meal packs. Vegetables Creamed or fried vegetables. Vegetables in a cheese sauce. Regular canned vegetables (not low-sodium or reduced-sodium). Regular canned tomato sauce and paste (not low-sodium or reduced-sodium). Regular tomato and vegetable juice (not low-sodium or reduced-sodium). Pickles. Olives. Fruits Canned fruit in a light or heavy syrup. Fried fruit. Fruit in cream or butter sauce. Meat and other protein foods Fatty cuts of meat. Ribs. Fried meat. Bacon. Sausage. Bologna and other processed lunch meats. Salami. Fatback. Hotdogs. Bratwurst. Salted nuts and seeds. Canned beans with added salt.   Canned or smoked fish. Whole eggs or egg yolks. Chicken or turkey with skin. Dairy Whole or 2% milk, cream, and half-and-half. Whole or full-fat cream cheese. Whole-fat or sweetened yogurt. Full-fat cheese. Nondairy creamers. Whipped toppings. Processed cheese and cheese spreads. Fats and oils Butter. Stick margarine. Lard. Shortening. Ghee. Bacon fat. Tropical oils, such as coconut, palm kernel, or palm oil. Seasoning and other foods Salted popcorn and pretzels. Onion salt, garlic salt, seasoned salt, table salt, and sea salt. Worcestershire sauce. Tartar sauce. Barbecue sauce. Teriyaki sauce. Soy sauce, including reduced-sodium. Steak sauce. Canned and packaged gravies. Fish sauce. Oyster sauce. Cocktail sauce. Horseradish that you find on the shelf. Ketchup. Mustard. Meat flavorings and tenderizers. Bouillon cubes. Hot sauce and Tabasco sauce. Premade or packaged marinades. Premade or packaged taco seasonings. Relishes. Regular salad dressings. Where to find more information:  National Heart, Lung, and Blood Institute: www.nhlbi.nih.gov  American Heart Association: www.heart.org Summary  The DASH eating plan is a healthy eating plan that has been shown to reduce high blood pressure (hypertension). It may also reduce  your risk for type 2 diabetes, heart disease, and stroke.  With the DASH eating plan, you should limit salt (sodium) intake to 2,300 mg a day. If you have hypertension, you may need to reduce your sodium intake to 1,500 mg a day.  When on the DASH eating plan, aim to eat more fresh fruits and vegetables, whole grains, lean proteins, low-fat dairy, and heart-healthy fats.  Work with your health care provider or diet and nutrition specialist (dietitian) to adjust your eating plan to your individual calorie needs. This information is not intended to replace advice given to you by your health care provider. Make sure you discuss any questions you have with your health care provider. Document Revised: 11/27/2017 Document Reviewed: 12/08/2016 Elsevier Patient Education  2020 [employer] Inc[employer]  

## 2020-08-16 ENCOUNTER — Other Ambulatory Visit: Payer: Self-pay | Admitting: Gerontology

## 2020-08-16 DIAGNOSIS — K7030 Alcoholic cirrhosis of liver without ascites: Secondary | ICD-10-CM

## 2020-09-10 ENCOUNTER — Ambulatory Visit: Payer: Self-pay | Admitting: Pharmacist

## 2020-09-10 ENCOUNTER — Other Ambulatory Visit: Payer: Self-pay

## 2020-09-10 DIAGNOSIS — Z79899 Other long term (current) drug therapy: Secondary | ICD-10-CM

## 2020-09-10 NOTE — Progress Notes (Addendum)
[facility] Visit Note  Patient: Collin Byrd MRN: [id number] Date of Birth: 12/30/1960 PCP: Langston Reusing, NP   Richard Miu 59 y.o. male presents for a MTM visit today via telephone. Patient reports he does not take care of medications, therefore caregiver at home provided answers for most of the visit. Call was initiated using interpreter [phone]) for patient. Caregiver spoke [place].  There were no vitals taken for this visit.  Patient Information   Past Medical History:  Diagnosis Date  . Cataract   . CHF (congestive heart failure) ([place])   . Cirrhosis ([place])   . Diabetes mellitus without complication ([place])   . Pulmonary embolus [facility])       Past Surgical History:  Procedure Laterality Date  . CATARACT EXTRACTION       History reviewed. No pertinent family history.     Family Support: Unclear of the extent of caregiver's involvement, as she seemed unaware of many of the patient's medications.           Social History   Substance and Sexual Activity  Alcohol Use Not Currently      Social History   Tobacco Use  Smoking Status Former Smoker  . Packs/day: 0.25  . Types: Cigarettes  Smokeless Tobacco Never Used      Health Maintenance  Topic Date Due  . PNEUMOCOCCAL POLYSACCHARIDE VACCINE AGE 16-64 HIGH RISK  Never done  . FOOT EXAM  Never done  . OPHTHALMOLOGY EXAM  Never done  . URINE MICROALBUMIN  Never done  . COVID-19 Vaccine (1) Never done  . TETANUS/TDAP  Never done  . COLONOSCOPY  Never done  . INFLUENZA VACCINE  Never done  . HEMOGLOBIN A1C  12/06/2020  . Hepatitis C Screening  Completed  . HIV Screening  Completed     Health Maintenance/Date Completed  Last ED visit: June 2021 Last Visit to PCP: August 2021 Next Visit to PCP: September 22nd, 2021 Specialist Visit: HF clinic October 4; heme/onc with Dr. Rogue Bussing 10/26 Dental Exam: no Eye Exam: no Prostate Exam: unsure Colonoscopy: no  Flu Vaccine:  not yet  COVID-19 Vaccine: no Shingrix Vaccine: no Per caregiver, patient has had "no shots"  Outpatient Encounter Medications as of 09/10/2020  Medication Sig  . blood glucose meter kit and supplies KIT Dispense based on patient and insurance preference. Use up to four times daily as directed. (FOR ICD-9 250.00, 250.01).  . FEROSUL 325 (65 Fe) MG tablet TAKE ONE TABLET BY MOUTH EVERY DAY WITH BREAKFAST.  . folic acid (FOLVITE) 1 MG tablet TAKE ONE TABLET BY MOUTH DAILY  . glimepiride (AMARYL) 2 MG tablet Take 1 tablet (2 mg total) by mouth every morning.  . pantoprazole (PROTONIX) 40 MG tablet TAKE ONE TABLET BY MOUTH EVERY DAY  . rivaroxaban (XARELTO) 20 MG TABS tablet Take 1 tablet (20 mg total) by mouth daily with supper.  Marland Kitchen spironolactone (ALDACTONE) 100 MG tablet Take 1 tablet (100 mg total) by mouth 2 (two) times daily.  Marland Kitchen thiamine 100 MG tablet TAKE ONE TABLET BY MOUTH EVERY DAY  . furosemide (LASIX) 40 MG tablet Take 1 tablet (40 mg total) by mouth daily. (Patient not taking: Reported on 09/10/2020)   No facility-administered encounter medications on file as of 09/10/2020.      Assessment and Plan:  T2DM: Taking glimepiride and checking BG daily. FBG in past 5 days: 137, 117, 115, 96, 110. Caregiver reports that patient checks BG before meals. Counseled on symptoms of hypoglycemia  and hypoglycemia management plan.   PE: On Xarelto. Reminded caregiver that Xarelto should be taken with a meal. Counseled on symptoms of clotting including red/tender extremities or SOB/chest pain and bleeding, including easy bruising or, more emergently, blood in stool. Followed by Dr. Rogue Bussing. Reaching out to Dr. Rogue Bussing to see if we should start patient assistance for Eliquis instead of Xarelto, per hepatic impairment dosing recommendation from UpToDate.  CHF: Reminded patient to check weight daily and call HF clinic if weight increases by 2 lb overnight or 5 lbs in one week, per Dr. Verdene Lennert  note on 08/01/2020.   Health Maintenance: Reminded patient's caregiver of patient's upcoming appointments. Patient has not had flu shot. Offered free flu shot via [facility] on September 28th, however patient declined.    Cirrhosis: Consulted with NP from [facility]; states a referral was placed for [facility] back in July. NP called the patient (09/11/20) and spoke with the caregiver. Patient's caregiver was given the number to call and schedule his appointment.     Dimple Nanas PharmD Student

## 2020-09-12 ENCOUNTER — Other Ambulatory Visit: Payer: Self-pay

## 2020-09-12 DIAGNOSIS — I2699 Other pulmonary embolism without acute cor pulmonale: Secondary | ICD-10-CM

## 2020-09-12 DIAGNOSIS — E1165 Type 2 diabetes mellitus with hyperglycemia: Secondary | ICD-10-CM

## 2020-09-13 LAB — HEMOGLOBIN A1C
Est. average glucose Bld gHb Est-mCnc: 108 mg/dL
Hgb A1c MFr Bld: 5.4 % (ref 4.8–5.6)

## 2020-09-17 ENCOUNTER — Other Ambulatory Visit: Payer: Self-pay | Admitting: Gerontology

## 2020-09-17 DIAGNOSIS — K7030 Alcoholic cirrhosis of liver without ascites: Secondary | ICD-10-CM

## 2020-09-19 ENCOUNTER — Ambulatory Visit: Payer: Self-pay | Admitting: Gerontology

## 2020-09-19 ENCOUNTER — Telehealth: Payer: Self-pay | Admitting: Gerontology

## 2020-09-19 NOTE — Telephone Encounter (Signed)
Called pt to reschedule missed appt from 9/22. Spoke to woman, she said she would let him know. MD9/22@2 :8

## 2020-09-26 ENCOUNTER — Other Ambulatory Visit: Payer: Self-pay

## 2020-09-26 ENCOUNTER — Ambulatory Visit: Payer: Self-pay | Admitting: Gerontology

## 2020-09-26 VITALS — BP 124/77 | HR 99 | Resp 16 | Ht 67.0 in | Wt 193.8 lb

## 2020-09-26 DIAGNOSIS — Z8679 Personal history of other diseases of the circulatory system: Secondary | ICD-10-CM

## 2020-09-26 DIAGNOSIS — K219 Gastro-esophageal reflux disease without esophagitis: Secondary | ICD-10-CM

## 2020-09-26 DIAGNOSIS — K7030 Alcoholic cirrhosis of liver without ascites: Secondary | ICD-10-CM

## 2020-09-26 DIAGNOSIS — E1165 Type 2 diabetes mellitus with hyperglycemia: Secondary | ICD-10-CM

## 2020-09-26 MED ORDER — PANTOPRAZOLE SODIUM 40 MG PO TBEC: 40.0000 mg | DELAYED_RELEASE_TABLET | Freq: Every day | ORAL | 2 refills | Status: DC

## 2020-09-26 MED ORDER — THIAMINE HCL 100 MG PO TABS: 100.0000 mg | ORAL_TABLET | Freq: Every day | ORAL | 2 refills | Status: DC

## 2020-09-26 MED ORDER — FERROUS SULFATE 325 (65 FE) MG PO TABS: ORAL_TABLET | ORAL | 2 refills | Status: DC

## 2020-09-26 MED ORDER — GLIMEPIRIDE 1 MG PO TABS: 1.0000 mg | ORAL_TABLET | Freq: Every day | ORAL | 1 refills | Status: DC

## 2020-09-26 MED ORDER — FOLIC ACID 1 MG PO TABS: 1.0000 mg | ORAL_TABLET | Freq: Every day | ORAL | 2 refills | Status: DC

## 2020-09-26 NOTE — Progress Notes (Signed)
Established Patient Office Visit  Subjective:  Patient ID: Collin Byrd, male    DOB: [birth year]  Age: 59 y.o. MRN: [id number]  CC: Lab Review  HPI Collin Byrd presents for lab review. He reports medication compliance and brought his medications for review. His Hgb A1c on 09/12/20 was 5.4% which is down from 7.5% on 06/06/20. AM sugars run between 100-150 and PM between 77-127 according to his log. Glucose checked in clinic and was 123. He denies hypo or hyperglycemia episodes. Denies any peripheral neuropathy symptoms and reports that he performs daily foot checks. He states that he weighs himself daily as instructed by CHF clinic. No gains in weight greater than 5 lbs according to his log. No alcohol intake. Patient reports improving GERD symptoms and states that his acid reflux is controlled on his medication. Overall, patient states that he is doing well and offers no further complaint.   Past Medical History:  Diagnosis Date  . Cataract   . CHF (congestive heart failure) ([place])   . Cirrhosis ([place])   . Diabetes mellitus without complication ([place])   . Pulmonary embolus [facility])     Past Surgical History:  Procedure Laterality Date  . CATARACT EXTRACTION      No family history on file.  Social History   Socioeconomic History  . Marital status: Married    Spouse name: Not on file  . Number of children: Not on file  . Years of education: Not on file  . Highest education level: Not on file  Occupational History  . Not on file  Tobacco Use  . Smoking status: Former Smoker    Packs/day: 0.25    Types: Cigarettes  . Smokeless tobacco: Never Used  Vaping Use  . Vaping Use: Never used  Substance and Sexual Activity  . Alcohol use: Not Currently  . Drug use: Never  . Sexual activity: Not Currently  Other Topics Concern  . Not on file  Social History Narrative  . Not on file   Social Determinants of Health   Financial Resource Strain:   . Difficulty of Paying Living  Expenses: Not on file  Food Insecurity:   . Worried About Charity fundraiser in the Last Year: Not on file  . Ran Out of Food in the Last Year: Not on file  Transportation Needs:   . Lack of Transportation (Medical): Not on file  . Lack of Transportation (Non-Medical): Not on file  Physical Activity:   . Days of Exercise per Week: Not on file  . Minutes of Exercise per Session: Not on file  Stress:   . Feeling of Stress : Not on file  Social Connections:   . Frequency of Communication with Friends and Family: Not on file  . Frequency of Social Gatherings with Friends and Family: Not on file  . Attends Religious Services: Not on file  . Active Member of Clubs or Organizations: Not on file  . Attends Archivist Meetings: Not on file  . Marital Status: Not on file  Intimate Partner Violence:   . Fear of Current or Ex-Partner: Not on file  . Emotionally Abused: Not on file  . Physically Abused: Not on file  . Sexually Abused: Not on file    Outpatient Medications Prior to Visit  Medication Sig Dispense Refill  . furosemide (LASIX) 40 MG tablet Take 1 tablet (40 mg total) by mouth daily. 30 tablet 2  . rivaroxaban (XARELTO) 20 MG TABS tablet Take 1  tablet (20 mg total) by mouth daily with supper. 30 tablet 3  . spironolactone (ALDACTONE) 100 MG tablet Take 1 tablet (100 mg total) by mouth 2 (two) times daily. 60 tablet 2  . FEROSUL 325 (65 Fe) MG tablet TAKE ONE TABLET BY MOUTH EVERY DAY WITH BREAKFAST. 30 tablet 0  . folic acid (FOLVITE) 1 MG tablet TAKE ONE TABLET BY MOUTH DAILY 30 tablet 0  . glimepiride (AMARYL) 2 MG tablet Take 1 tablet (2 mg total) by mouth every morning. 30 tablet 2  . pantoprazole (PROTONIX) 40 MG tablet TAKE ONE TABLET BY MOUTH EVERY DAY 30 tablet 0  . thiamine 100 MG tablet TAKE ONE TABLET BY MOUTH EVERY DAY 30 tablet 0  . blood glucose meter kit and supplies KIT Dispense based on patient and insurance preference. Use up to four times daily as  directed. (FOR ICD-9 250.00, 250.01). 1 each 0   No facility-administered medications prior to visit.    No Known Allergies  ROS Review of Systems  Constitutional: Negative.   Respiratory: Negative.   Cardiovascular: Negative.   Gastrointestinal: Negative.   Skin: Negative.   Neurological: Negative.   Hematological: Does not bruise/bleed easily.      Objective:    Physical Exam Constitutional:      Appearance: Normal appearance.  Cardiovascular:     Rate and Rhythm: Normal rate and regular rhythm.     Heart sounds: Normal heart sounds.  Pulmonary:     Effort: Pulmonary effort is normal.     Breath sounds: Normal breath sounds.  Abdominal:     General: Abdomen is flat. Bowel sounds are normal.     Palpations: Abdomen is soft.  Skin:    General: Skin is warm and dry.     Findings: No bruising.  Neurological:     Mental Status: He is alert.     BP 124/77 (BP Location: Left Arm, Patient Position: Sitting, Cuff Size: Large)   Pulse 99   Resp 16   Ht 5' 7"  (1.702 m)   Wt 193 lb 12.8 oz (87.9 kg)   SpO2 97%   BMI 30.35 kg/m  Wt Readings from Last 3 Encounters:  09/26/20 193 lb 12.8 oz (87.9 kg)  08/09/20 187 lb 3.2 oz (84.9 kg)  08/01/20 185 lb (83.9 kg)   Advised to lose weight. Continue tracking as advised.   Health Maintenance Due  Topic Date Due  . PNEUMOCOCCAL POLYSACCHARIDE VACCINE AGE 24-64 HIGH RISK  Never done  . FOOT EXAM  Never done  . OPHTHALMOLOGY EXAM  Never done  . URINE MICROALBUMIN  Never done  . COVID-19 Vaccine (1) Never done  . TETANUS/TDAP  Never done  . COLONOSCOPY  Never done  . INFLUENZA VACCINE  Never done    There are no preventive care reminders to display for this patient.  Lab Results  Component Value Date   TSH 0.773 06/05/2020   Lab Results  Component Value Date   WBC 8.2 07/24/2020   HGB 10.2 (L) 07/24/2020   HCT 31.8 (L) 07/24/2020   MCV 89.8 07/24/2020   PLT 305 07/24/2020   Lab Results  Component Value Date    NA 139 07/24/2020   K 4.1 07/24/2020   CO2 25 07/24/2020   GLUCOSE 124 (H) 07/24/2020   BUN 14 07/24/2020   CREATININE 1.08 07/24/2020   BILITOT 1.1 07/24/2020   ALKPHOS 55 07/24/2020   AST 20 07/24/2020   ALT 15 07/24/2020   PROT 7.6  07/24/2020   ALBUMIN 3.9 07/24/2020   CALCIUM 9.1 07/24/2020   ANIONGAP 9 07/24/2020   Lab Results  Component Value Date   CHOL 83 06/06/2020   Lab Results  Component Value Date   HDL 22 (L) 06/06/2020   Lab Results  Component Value Date   LDLCALC 52 06/06/2020   Lab Results  Component Value Date   TRIG 47 06/06/2020   Lab Results  Component Value Date   CHOLHDL 3.8 06/06/2020   Lab Results  Component Value Date   HGBA1C 5.4 09/12/2020      Assessment & Plan:   1. Alcoholic cirrhosis, unspecified whether ascites present ([place]) GI follow up on 11/28/20. Continue abstaining from alcohol.  - folic acid (FOLVITE) 1 MG tablet; Take 1 tablet (1 mg total) by mouth daily.  Dispense: 30 tablet; Refill: 2 - ferrous sulfate (FEROSUL) 325 (65 FE) MG tablet; TAKE ONE TABLET BY MOUTH EVERY DAY WITH BREAKFAST.  Dispense: 30 tablet; Refill: 2 - thiamine 100 MG tablet; Take 1 tablet (100 mg total) by mouth daily.  Dispense: 30 tablet; Refill: 2  2. Gastroesophageal reflux disease without esophagitis Continue your Protonix as prescribed. Avoid spicy foods, carbonated drinks, and laying flat after meals. GI follow up on 11/28/20 - pantoprazole (PROTONIX) 40 MG tablet; Take 1 tablet (40 mg total) by mouth daily.  Dispense: 30 tablet; Refill: 2  3. Type 2 diabetes mellitus with hyperglycemia, without long-term current use of insulin (HCC) Glimeperide lowered to 43m tab PO daily. Continue checking sugar twice daily. - Urine Microalbumin w/creat. ratio; Future - HgB A1c; Future - glimepiride (AMARYL) 1 MG tablet; Take 1 tablet (1 mg total) by mouth daily with breakfast.  Dispense: 30 tablet; Refill: 1  4. History of congestive heart failure He will  continue current treatment regimen and follow-up with TDarylene Price NP on 10/01/20 at the CHF clinic. Advised to continue recording daily weight and notify provider of weekly weight gain >5 pounds.   Follow-up: Return in about 10 weeks (around 12/05/2020), or if symptoms worsen or fail to improve.    JMarina Gravel Student-NP

## 2020-09-26 NOTE — Patient Instructions (Signed)

## 2020-09-28 NOTE — Progress Notes (Signed)
Patient ID: Collin Byrd, male    DOB: [birth year], 59 y.o.   MRN: [id number]  HPI  [facility] interpreter present during entire visit   Collin Byrd is a 59 y/o male with a history of DM, cataract, PE, cirrhosis, previous tobacco/ alcohol use and chronic heart failure.   Echo report from 06/05/20 reviewed and showed an EF of >55% along with moderate LVH.   Admitted 06/05/20 due to shortness of breath due to acute PE. Cardiology, nephrology, GI and palliative care consults obtained. Needed oxygen but able to be weaned completely off of it. Paracentesis done with removal of 2.5L. Placed on lasix and aldactone. Antibiotic course completed due to clinical sepsis. Discharged after 16 days.   He presents today with a chief complaint of a follow-up visit. He does endorse difficulty sleeping at times along with easy bruising. He does note a slight gradual weight gain. He denies any fatigue, cough, shortness of breath, chest pain, pedal edema, palpitations, abdominal distention or dizziness.   Past Medical History:  Diagnosis Date  . Cataract   . CHF (congestive heart failure) ([place])   . Cirrhosis ([place])   . Diabetes mellitus without complication ([place])   . Pulmonary embolus [facility])    Past Surgical History:  Procedure Laterality Date  . CATARACT EXTRACTION     No family history on file. Social History   Tobacco Use  . Smoking status: Former Smoker    Packs/day: 0.25    Types: Cigarettes  . Smokeless tobacco: Never Used  Substance Use Topics  . Alcohol use: Not Currently   No Known Allergies  Prior to Admission medications   Medication Sig Start Date End Date Taking? Authorizing Provider  blood glucose meter kit and supplies KIT Dispense based on patient and insurance preference. Use up to four times daily as directed. (FOR ICD-9 250.00, 250.01). 07/12/20  Yes Iloabachie, Chioma E, NP  ferrous sulfate (FEROSUL) 325 (65 FE) MG tablet TAKE ONE TABLET BY MOUTH EVERY DAY WITH BREAKFAST. 09/26/20  Yes  Iloabachie, Chioma E, NP  folic acid (FOLVITE) 1 MG tablet Take 1 tablet (1 mg total) by mouth daily. 09/26/20  Yes Iloabachie, Chioma E, NP  furosemide (LASIX) 40 MG tablet Take 1 tablet (40 mg total) by mouth daily. 08/09/20  Yes Iloabachie, Chioma E, NP  glimepiride (AMARYL) 1 MG tablet Take 1 tablet (1 mg total) by mouth daily with breakfast. 09/26/20  Yes Iloabachie, Chioma E, NP  pantoprazole (PROTONIX) 40 MG tablet Take 1 tablet (40 mg total) by mouth daily. 09/26/20  Yes Iloabachie, Chioma E, NP  rivaroxaban (XARELTO) 20 MG TABS tablet Take 1 tablet (20 mg total) by mouth daily with supper. 07/24/20 11/21/20 Yes Collin Sickle, MD  spironolactone (ALDACTONE) 100 MG tablet Take 1 tablet (100 mg total) by mouth 2 (two) times daily. 08/09/20  Yes Iloabachie, Chioma E, NP    Review of Systems  Constitutional: Negative for appetite change and fatigue.  HENT: Negative for congestion, postnasal drip and sore throat.   Eyes: Negative.   Respiratory: Negative for cough, chest tightness and shortness of breath.   Cardiovascular: Negative for chest pain, palpitations and leg swelling.  Gastrointestinal: Negative for abdominal distention and abdominal pain.  Endocrine: Negative.   Genitourinary: Negative.   Musculoskeletal: Negative for back pain and neck pain.  Skin: Negative.   Allergic/Immunologic: Negative.   Neurological: Negative for dizziness and light-headedness.  Hematological: Negative for adenopathy. Bruises/bleeds easily.  Psychiatric/Behavioral: Positive for sleep disturbance (sleeping on 1  pillow). Negative for dysphoric mood. The patient is not nervous/anxious.    Vitals:   10/01/20 1206  BP: 127/81  Pulse: 90  Resp: 18  SpO2: 98%  Weight: 193 lb 2 oz (87.6 kg)  Height: _0  (1.702 m)   Wt Readings from Last 3 Encounters:  10/01/20 193 lb 2 oz (87.6 kg)  09/26/20 193 lb 12.8 oz (87.9 kg)  08/09/20 187 lb 3.2 oz (84.9 kg)   Lab Results  Component Value Date    CREATININE 1.08 07/24/2020   CREATININE 0.79 06/21/2020   CREATININE 0.74 06/20/2020    Physical Exam Vitals and nursing note reviewed.  Constitutional:      Appearance: Normal appearance.  HENT:     Head: Normocephalic and atraumatic.  Cardiovascular:     Rate and Rhythm: Normal rate and regular rhythm.  Pulmonary:     Effort: Pulmonary effort is normal. No respiratory distress.     Breath sounds: No wheezing or rales.  Abdominal:     General: There is no distension.     Palpations: Abdomen is soft.     Tenderness: There is no abdominal tenderness.  Musculoskeletal:        General: No tenderness.     Cervical back: Normal range of motion.     Right lower leg: No edema.     Left lower leg: No edema.  Skin:    General: Skin is warm and dry.  Neurological:     General: No focal deficit present.     Mental Status: He is alert and oriented to person, place, and time.  Psychiatric:        Mood and Affect: Mood normal.        Behavior: Behavior normal.        Thought Content: Thought content normal.    Assessment & Plan:  1: Chronic heart failure with preserved ejection fraction with structural changes (LVH)- - NYHA class I - euvolemic today - weighing daily; reminded to call for an overnight weight gain of >2 pounds or a weekly weight gain of >5 pounds; home weight chart was reviewed, no sudden weight gain noted - weight up 8 pounds from last visit 2 months ago - not adding salt to his food  - will add entresto 24/12m BID today; 30 day voucher given to patient and RX sent to [facility] along with 90 day RX sent to medication management clinic - check BMP at next visit - consider best blocker depending on HR - BMP 07/24/20 reviewed and showed sodium 139, potassium 4.1, creatinine 1.08 and GFR >60 - BNP 06/05/20 was 155.9  2: DM- - saw PCP @ [facility] 09/26/20 - glucose at home today was 135 - A1c 06/06/20 was 77.3% 3: Alcoholic cirrhosis- - paracentesis done 06/06/20  with removal of 2.5L - says that he hasn't had any alcohol since hospitalization  4: PE- - saw hematology ([place] Bussing 07/24/20 - taking xarelto    Medication bottles were reviewed.   Return in 1 month or sooner for any questions/problems before then.

## 2020-10-01 ENCOUNTER — Ambulatory Visit: Payer: Self-pay | Attending: Family | Admitting: Family

## 2020-10-01 ENCOUNTER — Other Ambulatory Visit: Payer: Self-pay | Admitting: Family

## 2020-10-01 ENCOUNTER — Encounter: Payer: Self-pay | Admitting: Family

## 2020-10-01 ENCOUNTER — Other Ambulatory Visit: Payer: Self-pay

## 2020-10-01 VITALS — BP 127/81 | HR 90 | Resp 18 | Ht 67.0 in | Wt 193.1 lb

## 2020-10-01 DIAGNOSIS — E1136 Type 2 diabetes mellitus with diabetic cataract: Secondary | ICD-10-CM | POA: Insufficient documentation

## 2020-10-01 DIAGNOSIS — I2782 Chronic pulmonary embolism: Secondary | ICD-10-CM

## 2020-10-01 DIAGNOSIS — K7030 Alcoholic cirrhosis of liver without ascites: Secondary | ICD-10-CM | POA: Insufficient documentation

## 2020-10-01 DIAGNOSIS — Z86711 Personal history of pulmonary embolism: Secondary | ICD-10-CM | POA: Insufficient documentation

## 2020-10-01 DIAGNOSIS — Z7901 Long term (current) use of anticoagulants: Secondary | ICD-10-CM | POA: Insufficient documentation

## 2020-10-01 DIAGNOSIS — Z79899 Other long term (current) drug therapy: Secondary | ICD-10-CM | POA: Insufficient documentation

## 2020-10-01 DIAGNOSIS — G479 Sleep disorder, unspecified: Secondary | ICD-10-CM | POA: Insufficient documentation

## 2020-10-01 DIAGNOSIS — E119 Type 2 diabetes mellitus without complications: Secondary | ICD-10-CM

## 2020-10-01 DIAGNOSIS — I5032 Chronic diastolic (congestive) heart failure: Secondary | ICD-10-CM | POA: Insufficient documentation

## 2020-10-01 DIAGNOSIS — Z7984 Long term (current) use of oral hypoglycemic drugs: Secondary | ICD-10-CM | POA: Insufficient documentation

## 2020-10-01 DIAGNOSIS — K7031 Alcoholic cirrhosis of liver with ascites: Secondary | ICD-10-CM

## 2020-10-01 DIAGNOSIS — Z87891 Personal history of nicotine dependence: Secondary | ICD-10-CM | POA: Insufficient documentation

## 2020-10-01 MED ORDER — SACUBITRIL-VALSARTAN 24-26 MG PO TABS
1.0000 | ORAL_TABLET | Freq: Two times a day (BID) | ORAL | 3 refills | Status: DC
Start: 2020-10-01 — End: 2020-12-05

## 2020-10-01 MED ORDER — SACUBITRIL-VALSARTAN 24-26 MG PO TABS: 1.0000 | ORAL_TABLET | Freq: Two times a day (BID) | ORAL | 0 refills | Status: DC

## 2020-10-01 MED ORDER — SACUBITRIL-VALSARTAN 24-26 MG PO TABS: 1.0000 | ORAL_TABLET | Freq: Two times a day (BID) | ORAL | 3 refills | Status: DC

## 2020-10-01 NOTE — Patient Instructions (Addendum)
Continue weighing daily and call for an overnight weight gain of > 2 pounds or a weekly weight gain of >5 pounds.    Take coupon to [facility] and you pick up entresto and you will begin taking it as 1 tablet in the morning and 1 tablet at bedtime or evening time with your other medications.

## 2020-10-02 ENCOUNTER — Telehealth: Payer: Self-pay | Admitting: Pharmacist

## 2020-10-02 NOTE — Telephone Encounter (Signed)
10/02/2020 11:55:01 AM - Collin Byrd forms to patient  -- Collin Byrd - Tuesday, October 02, 2020 11:53 AM --Received pharmacy printout for Collin Byrd 24/26 Take one tablet by mouth 2 times a day, printed [employer] application-mailing patient his portion to sign & return, --WILL THEN SEND TO TINA TO SIGN.

## 2020-10-23 ENCOUNTER — Inpatient Hospital Stay: Payer: Self-pay

## 2020-10-23 ENCOUNTER — Encounter: Payer: Self-pay | Admitting: Internal Medicine

## 2020-10-23 ENCOUNTER — Inpatient Hospital Stay: Payer: Self-pay | Attending: Internal Medicine | Admitting: Internal Medicine

## 2020-10-23 ENCOUNTER — Other Ambulatory Visit: Payer: Self-pay

## 2020-10-23 VITALS — BP 105/61 | HR 76 | Temp 98.4°F | Resp 16 | Wt 191.4 lb

## 2020-10-23 DIAGNOSIS — R599 Enlarged lymph nodes, unspecified: Secondary | ICD-10-CM | POA: Insufficient documentation

## 2020-10-23 DIAGNOSIS — R590 Localized enlarged lymph nodes: Secondary | ICD-10-CM

## 2020-10-23 DIAGNOSIS — I2699 Other pulmonary embolism without acute cor pulmonale: Secondary | ICD-10-CM

## 2020-10-23 DIAGNOSIS — Z7901 Long term (current) use of anticoagulants: Secondary | ICD-10-CM | POA: Insufficient documentation

## 2020-10-23 DIAGNOSIS — Z87891 Personal history of nicotine dependence: Secondary | ICD-10-CM | POA: Insufficient documentation

## 2020-10-23 DIAGNOSIS — J189 Pneumonia, unspecified organism: Secondary | ICD-10-CM

## 2020-10-23 DIAGNOSIS — K7460 Unspecified cirrhosis of liver: Secondary | ICD-10-CM | POA: Insufficient documentation

## 2020-10-23 LAB — CBC WITH DIFFERENTIAL/PLATELET
Abs Immature Granulocytes: 0.05 10*3/uL (ref 0.00–0.07)
Basophils Absolute: 0.1 10*3/uL (ref 0.0–0.1)
Basophils Relative: 1 %
Eosinophils Absolute: 0.1 10*3/uL (ref 0.0–0.5)
Eosinophils Relative: 1 %
HCT: 36.7 % — ABNORMAL LOW (ref 39.0–52.0)
Hemoglobin: 12.6 g/dL — ABNORMAL LOW (ref 13.0–17.0)
Immature Granulocytes: 1 %
Lymphocytes Relative: 9 %
Lymphs Abs: 0.9 10*3/uL (ref 0.7–4.0)
MCH: 31.2 pg (ref 26.0–34.0)
MCHC: 34.3 g/dL (ref 30.0–36.0)
MCV: 90.8 fL (ref 80.0–100.0)
Monocytes Absolute: 0.8 10*3/uL (ref 0.1–1.0)
Monocytes Relative: 9 %
Neutro Abs: 7.4 10*3/uL (ref 1.7–7.7)
Neutrophils Relative %: 79 %
Platelets: 193 10*3/uL (ref 150–400)
RBC: 4.04 MIL/uL — ABNORMAL LOW (ref 4.22–5.81)
RDW: 13.2 % (ref 11.5–15.5)
WBC: 9.2 10*3/uL (ref 4.0–10.5)
nRBC: 0 % (ref 0.0–0.2)

## 2020-10-23 LAB — BASIC METABOLIC PANEL
Anion gap: 10 (ref 5–15)
BUN: 20 mg/dL (ref 6–20)
CO2: 25 mmol/L (ref 22–32)
Calcium: 9.7 mg/dL (ref 8.9–10.3)
Chloride: 103 mmol/L (ref 98–111)
Creatinine, Ser: 0.9 mg/dL (ref 0.61–1.24)
GFR, Estimated: >60 mL/min (ref >60)
Glucose, Bld: 91 mg/dL (ref 70–99)
Potassium: 4.4 mmol/L (ref 3.5–5.1)
Sodium: 138 mmol/L (ref 135–145)

## 2020-10-23 NOTE — Addendum Note (Signed)
Addended by: Delice Bison E on: 10/23/2020 03:32 PM   Modules accepted: Orders

## 2020-10-23 NOTE — Progress Notes (Signed)
Pt medication had that xarelto was discontinued but pt has medications with him and states he has been taking Xarelto 20mg  daily.

## 2020-10-23 NOTE — Assessment & Plan Note (Addendum)
#   June 2021 -acute bil pul embolism-unprovoked.  Etiology is unclear.  Continue Eliquis until June 2022. Recommend hypercoagulable work-up after anticoagulation is discontinued.    #June 2021-pneumonia/hilar and mediastinal adenopathy.  Will order 28-month follow-up CT scan today to assess for resolution of the pneumonia/lymphadenopathy.   # cirrhosis: ? Alcohol- quit since June 2021.  Awaiting GI evaluation in dec 2021.   # DISPOSITION:  # CT scan chest in 1 week- # follow up in 4 months- MD; labs- cbc/bmp-Dr.B

## 2020-10-23 NOTE — Progress Notes (Signed)
[facility] NOTE  Patient Care Team: Langston Reusing, NP as PCP - General (Gerontology)  CHIEF COMPLAINTS/PURPOSE OF CONSULTATION: DVT/PE  #  June 16th,2021- Acute bil Segmentl/sub-segmental PE; no right heart strain.  Unprovoked.-Recommend 1 year anticoagulation  # Mediastinal/Hilar adenopathy; Pneumonia [June, 2021]  # Cirrhosis [normal WBC/platelets; mild anemia] ? Alcohol.   Oncology History   No history exists.   HISTORY OF PRESENTING ILLNESS:  Collin Byrd 59 y.o.  male history of alcoholism/cirrhosis-and history of bilateral PE/with pneumonia is here for follow-up.  Patient continues to be compliant with Xarelto.  Denies any blood in stools or black or stools but no nausea no vomiting.   He denies any worsening cough or shortness of breath.  Complains of mild to moderate fatigue but no swelling in the legs.  He is awaiting evaluation with GI.  Review of Systems  Constitutional: Positive for malaise/fatigue. Negative for chills, diaphoresis, fever and weight loss.  HENT: Negative for nosebleeds and sore throat.   Eyes: Negative for double vision.  Respiratory: Negative for cough, hemoptysis, sputum production, shortness of breath and wheezing.   Cardiovascular: Negative for chest pain, palpitations, orthopnea and leg swelling.  Gastrointestinal: Negative for abdominal pain, blood in stool, constipation, diarrhea, heartburn, melena, nausea and vomiting.  Genitourinary: Negative for dysuria, frequency and urgency.  Musculoskeletal: Negative for back pain and joint pain.  Skin: Negative.  Negative for itching and rash.  Neurological: Negative for dizziness, tingling, focal weakness, weakness and headaches.  Endo/Heme/Allergies: Bruises/bleeds easily.  Psychiatric/Behavioral: Negative for depression. The patient is not nervous/anxious and does not have insomnia.      MEDICAL HISTORY:  Past Medical History:  Diagnosis Date  . Cataract   .  CHF (congestive heart failure) ([place])   . Cirrhosis ([place])   . Diabetes mellitus without complication ([place])   . Pulmonary embolus ([place])     SURGICAL HISTORY: Past Surgical History:  Procedure Laterality Date  . CATARACT EXTRACTION      SOCIAL HISTORY: Social History   Socioeconomic History  . Marital status: Married    Spouse name: Not on file  . Number of children: Not on file  . Years of education: Not on file  . Highest education level: Not on file  Occupational History  . Not on file  Tobacco Use  . Smoking status: Former Smoker    Packs/day: 0.25    Types: Cigarettes  . Smokeless tobacco: Never Used  Vaping Use  . Vaping Use: Never used  Substance and Sexual Activity  . Alcohol use: Not Currently  . Drug use: Never  . Sexual activity: Not Currently  Other Topics Concern  . Not on file  Social History Narrative  . Not on file   Social Determinants of Health   Financial Resource Strain:   . Difficulty of Paying Living Expenses: Not on file  Food Insecurity:   . Worried About Charity fundraiser in the Last Year: Not on file  . Ran Out of Food in the Last Year: Not on file  Transportation Needs:   . Lack of Transportation (Medical): Not on file  . Lack of Transportation (Non-Medical): Not on file  Physical Activity:   . Days of Exercise per Week: Not on file  . Minutes of Exercise per Session: Not on file  Stress:   . Feeling of Stress : Not on file  Social Connections:   . Frequency of Communication with Friends and Family: Not on file  .  Frequency of Social Gatherings with Friends and Family: Not on file  . Attends Religious Services: Not on file  . Active Member of Clubs or Organizations: Not on file  . Attends Archivist Meetings: Not on file  . Marital Status: Not on file  Intimate Partner Violence:   . Fear of Current or Ex-Partner: Not on file  . Emotionally Abused: Not on file  . Physically Abused: Not on file  . Sexually Abused: Not  on file    FAMILY HISTORY: History reviewed. No pertinent family history.  ALLERGIES:  has No Known Allergies.  MEDICATIONS:  Current Outpatient Medications  Medication Sig Dispense Refill  . blood glucose meter kit and supplies KIT Dispense based on patient and insurance preference. Use up to four times daily as directed. (FOR ICD-9 250.00, 250.01). 1 each 0  . ferrous sulfate (FEROSUL) 325 (65 FE) MG tablet TAKE ONE TABLET BY MOUTH EVERY DAY WITH BREAKFAST. 30 tablet 2  . folic acid (FOLVITE) 1 MG tablet Take 1 tablet (1 mg total) by mouth daily. 30 tablet 2  . furosemide (LASIX) 40 MG tablet Take 1 tablet (40 mg total) by mouth daily. 30 tablet 2  . glimepiride (AMARYL) 1 MG tablet Take 1 tablet (1 mg total) by mouth daily with breakfast. 30 tablet 1  . pantoprazole (PROTONIX) 40 MG tablet Take 1 tablet (40 mg total) by mouth daily. 30 tablet 2  . rivaroxaban (XARELTO) 20 MG TABS tablet Take 20 mg by mouth daily with supper.    . sacubitril-valsartan (ENTRESTO) 24-26 MG Take 1 tablet by mouth 2 (two) times daily. 180 tablet 3  . spironolactone (ALDACTONE) 100 MG tablet Take 1 tablet (100 mg total) by mouth 2 (two) times daily. 60 tablet 2  . thiamine (VITAMIN B-1) 100 MG tablet Take 100 mg by mouth daily.     No current facility-administered medications for this visit.      Marland Kitchen  PHYSICAL EXAMINATION:  Vitals:   10/23/20 1100  BP: 105/61  Pulse: 76  Resp: 16  Temp: 98.4 F (36.9 C)  SpO2: 100%   Filed Weights   10/23/20 1100  Weight: 191 lb 6.4 oz (86.8 kg)    Physical Exam HENT:     Head: Normocephalic and atraumatic.     Mouth/Throat:     Pharynx: No oropharyngeal exudate.  Eyes:     Pupils: Pupils are equal, round, and reactive to light.  Cardiovascular:     Rate and Rhythm: Normal rate and regular rhythm.  Pulmonary:     Effort: No respiratory distress.     Breath sounds: No wheezing.  Abdominal:     General: Bowel sounds are normal. There is no  distension.     Palpations: Abdomen is soft. There is no mass.     Tenderness: There is no abdominal tenderness. There is no guarding or rebound.  Musculoskeletal:        General: No tenderness. Normal range of motion.     Cervical back: Normal range of motion and neck supple.  Skin:    General: Skin is warm.  Neurological:     Mental Status: He is alert and oriented to person, place, and time.  Psychiatric:        Mood and Affect: Affect normal.      LABORATORY DATA:  I have reviewed the data as listed Lab Results  Component Value Date   WBC 9.2 10/23/2020   HGB 12.6 (L) 10/23/2020   HCT  36.7 (L) 10/23/2020   MCV 90.8 10/23/2020   PLT 193 10/23/2020   Recent Labs    06/05/20 0933 06/06/20 0106 06/07/20 0814 06/07/20 0814 06/08/20 0129 06/08/20 1515 06/12/20 0424 06/13/20 0416 06/14/20 0419 06/15/20 0415 06/20/20 0607 06/20/20 0607 06/21/20 0448 07/24/20 1215 10/23/20 1027  NA 129*   < > 133*   < > 136   < > 135   < > 136   < > 133*   < > 133* 139 138  K 4.0   < > 3.9   < > 2.7*   < > 4.5   < > 4.2   < > 4.4   < > 3.8 4.1 4.4  CL 97*   < > 97*   < > 94*   < > 103   < > 102   < > 99   < > 98 105 103  CO2 25   < > 30   < > 33*   < > 25   < > 30   < > 24   < > 26 25 25   GLUCOSE 183*   < > 142*   < > 167*   < > 330*   < > 152*   < > 247*   < > 184* 124* 91  BUN 9   < > 8   < > 9   < > 20   < > 16   < > 21*   < > 22* 14 20  CREATININE 0.83   < > 0.54*   < > 0.56*   < > 0.54*   < > 0.47*   < > 0.74   < > 0.79 1.08 0.90  CALCIUM 6.8*   < > 7.0*   < > 6.6*   < > 8.1*   < > 8.0*   < > 9.0   < > 8.8* 9.1 9.7  GFRNONAA >60   < > >60   < > >60   < > >60   < > >60   < > >60   < > >60 >60 >60  GFRAA >60   < > >60   < > >60   < > >60   < > >60   < > >60  --  >60 >60  --   PROT 5.7*  --  5.2*  --  5.3*  --   --   --   --   --   --   --   --  7.6  --   ALBUMIN 1.7*  1.7*  --  1.7*   < > 2.4*   < > 3.0*  --  3.0*  --   --   --   --  3.9  --   AST 25  --  22  --  17  --   --   --    --   --   --   --   --  20  --   ALT 22  --  20  --  18  --   --   --   --   --   --   --   --  15  --   ALKPHOS 59  --  50  --  41  --   --   --   --   --   --   --   --  55  --   BILITOT 1.0  --  0.9  --  0.6  --   --   --   --   --   --   --   --  1.1  --   BILIDIR 0.2  --   --   --   --   --   --   --   --   --   --   --   --   --   --   IBILI 0.8  --   --   --   --   --   --   --   --   --   --   --   --   --   --    < > = values in this interval not displayed.    RADIOGRAPHIC STUDIES: I have personally reviewed the radiological images as listed and agreed with the findings in the report. No results found.  ASSESSMENT & PLAN:   Acute pulmonary embolus [facility]) # June 2021 -acute bil pul embolism-unprovoked.  Etiology is unclear.  Continue Eliquis until June 2022. Recommend hypercoagulable work-up after anticoagulation is discontinued.    #June 2021-pneumonia/hilar and mediastinal adenopathy.  Will order 49-monthfollow-up CT scan today to assess for resolution of the pneumonia/lymphadenopathy.   # cirrhosis: ? Alcohol- quit since June 2021.  Awaiting GI evaluation in dec 2021.   # DISPOSITION:  # CT scan chest in 1 week- # follow up in 4 months- MD; labs- cbc/bmp-Dr.B   All questions were answered. The patient knows to call the clinic with any problems, questions or concerns.   GCammie Sickle MD 10/23/2020 12:54 PM

## 2020-10-25 ENCOUNTER — Other Ambulatory Visit: Payer: Self-pay

## 2020-10-25 ENCOUNTER — Ambulatory Visit: Payer: Self-pay | Admitting: Gerontology

## 2020-10-25 ENCOUNTER — Encounter: Payer: Self-pay | Admitting: Gerontology

## 2020-10-25 VITALS — BP 105/65 | HR 92 | Resp 16 | Wt 191.4 lb

## 2020-10-25 DIAGNOSIS — H11001 Unspecified pterygium of right eye: Secondary | ICD-10-CM

## 2020-10-25 DIAGNOSIS — Z0000 Encounter for general adult medical examination without abnormal findings: Secondary | ICD-10-CM

## 2020-10-25 DIAGNOSIS — Z8719 Personal history of other diseases of the digestive system: Secondary | ICD-10-CM

## 2020-10-25 DIAGNOSIS — Z8679 Personal history of other diseases of the circulatory system: Secondary | ICD-10-CM

## 2020-10-25 DIAGNOSIS — I2699 Other pulmonary embolism without acute cor pulmonale: Secondary | ICD-10-CM

## 2020-10-25 MED ORDER — ARTIFICIAL TEARS OPHTHALMIC OINT: TOPICAL_OINTMENT | Freq: Three times a day (TID) | OPHTHALMIC | 0 refills | Status: DC

## 2020-10-25 NOTE — Progress Notes (Signed)
Established Patient Office Visit  Subjective:  Patient ID: Collin Byrd, male    DOB: Oct 09, 1961  Age: 59 y.o. MRN: [id number]  CC: No chief complaint on file.   HPI Collin Byrd presents for general follow up. He saw Dr. Rogue Bussing on 10/23/20 for follow up on his anticoagulation related to bilateral PEs and pneumonia. Patient is compliant with his Xarelto and denies any melena or hematochezia. He denies new unexplained bruising. He denies worsening cough, shortness of breath, or any hemoptysis. He also saw Darylene Price at the CHF clinic on 10/01/20 and was started on Entresto. He states that he is tolerating it well. He denies any weight gain > 5 lb, shortness of breath, or extremity swelling. Patient reports continued abstinence from alcohol. He denies abdominal pain, tenderness, or distension. Denies jaundice, nausea, vomiting or diarrhea. He reports compliance with his diabetic diet and medications. His A1c was 5.4% on 9/15. He checks his sugars twice daily and they run between 118-144 according to his log. Denies any peripheral neuropathy symptoms or hypo/hyperglycemia episodes. He reports redness and blurry vision to his R eye that started about 4 years ago. States that nothing makes it better or worse and he has tried no treatment at home. He denies itchiness, pain or irritation. He would like a referral for an Ophthalmologist. Overall, he states that he is doing well and offers no further complaint.   Past Medical History:  Diagnosis Date  . Cataract   . CHF (congestive heart failure) ([place])   . Cirrhosis ([place])   . Diabetes mellitus without complication ([place])   . Pulmonary embolus [facility])     Past Surgical History:  Procedure Laterality Date  . CATARACT EXTRACTION      No family history on file.  Social History   Socioeconomic History  . Marital status: Married    Spouse name: Not on file  . Number of children: Not on file  . Years of education: Not on file  . Highest  education level: Not on file  Occupational History  . Not on file  Tobacco Use  . Smoking status: Former Smoker    Packs/day: 0.25    Types: Cigarettes  . Smokeless tobacco: Never Used  Vaping Use  . Vaping Use: Never used  Substance and Sexual Activity  . Alcohol use: Not Currently  . Drug use: Never  . Sexual activity: Not Currently  Other Topics Concern  . Not on file  Social History Narrative  . Not on file   Social Determinants of Health   Financial Resource Strain:   . Difficulty of Paying Living Expenses: Not on file  Food Insecurity:   . Worried About Charity fundraiser in the Last Year: Not on file  . Ran Out of Food in the Last Year: Not on file  Transportation Needs:   . Lack of Transportation (Medical): Not on file  . Lack of Transportation (Non-Medical): Not on file  Physical Activity:   . Days of Exercise per Week: Not on file  . Minutes of Exercise per Session: Not on file  Stress:   . Feeling of Stress : Not on file  Social Connections:   . Frequency of Communication with Friends and Family: Not on file  . Frequency of Social Gatherings with Friends and Family: Not on file  . Attends Religious Services: Not on file  . Active Member of Clubs or Organizations: Not on file  . Attends Archivist Meetings: Not on file  .  Marital Status: Not on file  Intimate Partner Violence:   . Fear of Current or Ex-Partner: Not on file  . Emotionally Abused: Not on file  . Physically Abused: Not on file  . Sexually Abused: Not on file    Outpatient Medications Prior to Visit  Medication Sig Dispense Refill  . blood glucose meter kit and supplies KIT Dispense based on patient and insurance preference. Use up to four times daily as directed. (FOR ICD-9 250.00, 250.01). 1 each 0  . ferrous sulfate (FEROSUL) 325 (65 FE) MG tablet TAKE ONE TABLET BY MOUTH EVERY DAY WITH BREAKFAST. 30 tablet 2  . folic acid (FOLVITE) 1 MG tablet Take 1 tablet (1 mg total) by  mouth daily. 30 tablet 2  . furosemide (LASIX) 40 MG tablet Take 1 tablet (40 mg total) by mouth daily. 30 tablet 2  . glimepiride (AMARYL) 1 MG tablet Take 1 tablet (1 mg total) by mouth daily with breakfast. 30 tablet 1  . pantoprazole (PROTONIX) 40 MG tablet Take 1 tablet (40 mg total) by mouth daily. 30 tablet 2  . rivaroxaban (XARELTO) 20 MG TABS tablet Take 20 mg by mouth daily with supper.    . sacubitril-valsartan (ENTRESTO) 24-26 MG Take 1 tablet by mouth 2 (two) times daily. 180 tablet 3  . spironolactone (ALDACTONE) 100 MG tablet Take 1 tablet (100 mg total) by mouth 2 (two) times daily. 60 tablet 2  . thiamine (VITAMIN B-1) 100 MG tablet Take 100 mg by mouth daily.     No facility-administered medications prior to visit.    No Known Allergies  ROS Review of Systems  Constitutional: Negative.   HENT: Negative.   Eyes: Positive for redness.  Respiratory: Negative.   Cardiovascular: Negative.   Gastrointestinal: Negative.   Endocrine: Negative.   Genitourinary: Negative.   Musculoskeletal: Negative.   Skin: Negative.   Allergic/Immunologic: Negative.   Neurological: Negative.   Hematological: Negative.   Psychiatric/Behavioral: Negative.       Objective:    Physical Exam Constitutional:      Appearance: Normal appearance.  HENT:     Head: Normocephalic.  Eyes:     General: Lids are normal. Vision grossly intact.     Extraocular Movements: Extraocular movements intact.     Pupils: Pupils are equal, round, and reactive to light.     Funduscopic exam:    Right eye: Red reflex present.        Left eye: Red reflex present.  Cardiovascular:     Rate and Rhythm: Normal rate and regular rhythm.     Heart sounds: Normal heart sounds.  Pulmonary:     Effort: Pulmonary effort is normal.     Breath sounds: Normal breath sounds.  Abdominal:     General: Abdomen is flat. Bowel sounds are normal.     Palpations: Abdomen is soft.  Musculoskeletal:        General:  Normal range of motion.  Skin:    General: Skin is warm and dry.     Capillary Refill: Capillary refill takes less than 2 seconds.  Neurological:     General: No focal deficit present.     Mental Status: He is alert and oriented to person, place, and time.  Psychiatric:        Mood and Affect: Mood normal.        Behavior: Behavior normal.        Thought Content: Thought content normal.  Judgment: Judgment normal.     BP 105/65   Pulse 92   Resp 16   Wt 191 lb 6.4 oz (86.8 kg)   SpO2 98%   BMI 29.98 kg/m  Wt Readings from Last 3 Encounters:  10/25/20 191 lb 6.4 oz (86.8 kg)  10/23/20 191 lb 6.4 oz (86.8 kg)  10/01/20 193 lb 2 oz (87.6 kg)     Health Maintenance Due  Topic Date Due  . PNEUMOCOCCAL POLYSACCHARIDE VACCINE AGE 75-64 HIGH RISK  Never done  . FOOT EXAM  Never done  . OPHTHALMOLOGY EXAM  Never done  . URINE MICROALBUMIN  Never done  . COVID-19 Vaccine (1) Never done  . TETANUS/TDAP  Never done  . COLONOSCOPY  Never done  . INFLUENZA VACCINE  Never done    There are no preventive care reminders to display for this patient.  Lab Results  Component Value Date   TSH 0.773 06/05/2020   Lab Results  Component Value Date   WBC 9.2 10/23/2020   HGB 12.6 (L) 10/23/2020   HCT 36.7 (L) 10/23/2020   MCV 90.8 10/23/2020   PLT 193 10/23/2020   Lab Results  Component Value Date   NA 138 10/23/2020   K 4.4 10/23/2020   CO2 25 10/23/2020   GLUCOSE 91 10/23/2020   BUN 20 10/23/2020   CREATININE 0.90 10/23/2020   BILITOT 1.1 07/24/2020   ALKPHOS 55 07/24/2020   AST 20 07/24/2020   ALT 15 07/24/2020   PROT 7.6 07/24/2020   ALBUMIN 3.9 07/24/2020   CALCIUM 9.7 10/23/2020   ANIONGAP 10 10/23/2020   Lab Results  Component Value Date   CHOL 83 06/06/2020   Lab Results  Component Value Date   HDL 22 (L) 06/06/2020   Lab Results  Component Value Date   LDLCALC 52 06/06/2020   Lab Results  Component Value Date   TRIG 47 06/06/2020   Lab  Results  Component Value Date   CHOLHDL 3.8 06/06/2020   Lab Results  Component Value Date   HGBA1C 5.4 09/12/2020      Assessment & Plan:   1. Acute pulmonary embolism, unspecified pulmonary embolism type, unspecified whether acute cor pulmonale present (HCC) Continue medications as prescribed. Keep follow up appointment with Dr. Tish Men Verbalized understanding to go to the ED with any chest pain, increased shortness of breath or new concerning symptoms.   2. Health care maintenance Return 11/14/20 for blood draw. Diabetic Foot exam planned for next appointment. - Lipid panel; Future - Urine Microalbumin w/creat. ratio; Future  3. Pterygium of eye, right Start artificial tears as prescribed. Provided with [facility] application for Ophthalmology. Verbalizes understanding to report to ED for eye pain, sudden vision loss, or other new visual complaints. - Ambulatory referral to Ophthalmology - artificial tears (LACRILUBE) OINT ophthalmic ointment; Place into the right eye 3 (three) times daily.  Dispense: 1 g; Refill: 0  4. History of gastroesophageal reflux (GERD) Continue medication as prescribed.  -Also educated to: --Avoid spicy, fatty and fried food --Avoid sodas and sour juices --Avoid heavy meals --Avoid eating 4 hours before bedtime --Elevate head of bed at night --Report to the Emergency Department with chest pain, shortness of breath, hematemesis, or severe pain.   5. History of congestive heart failure Continue treatment plan as prescribed. Continue weighing daily and call CHF clinic for an overnight weight gain of > 2 pounds or a weekly weight gain of >5 pounds. Keep appointment with Darylene Price, FNP at the  CHF clinic.    Follow-up: Return in about 1 month (around 11/28/2020), or if symptoms worsen or fail to improve.    Marina Gravel, Student-NP

## 2020-10-30 ENCOUNTER — Ambulatory Visit: Payer: Self-pay | Attending: Family | Admitting: Family

## 2020-10-30 ENCOUNTER — Other Ambulatory Visit: Payer: Self-pay | Admitting: Gerontology

## 2020-10-30 ENCOUNTER — Other Ambulatory Visit: Payer: Self-pay

## 2020-10-30 ENCOUNTER — Encounter: Payer: Self-pay | Admitting: Family

## 2020-10-30 VITALS — BP 115/77 | HR 93 | Resp 20 | Ht 67.0 in | Wt 190.5 lb

## 2020-10-30 DIAGNOSIS — H11001 Unspecified pterygium of right eye: Secondary | ICD-10-CM

## 2020-10-30 DIAGNOSIS — Z86711 Personal history of pulmonary embolism: Secondary | ICD-10-CM | POA: Insufficient documentation

## 2020-10-30 DIAGNOSIS — K7030 Alcoholic cirrhosis of liver without ascites: Secondary | ICD-10-CM | POA: Insufficient documentation

## 2020-10-30 DIAGNOSIS — I5032 Chronic diastolic (congestive) heart failure: Secondary | ICD-10-CM

## 2020-10-30 DIAGNOSIS — Z7984 Long term (current) use of oral hypoglycemic drugs: Secondary | ICD-10-CM | POA: Insufficient documentation

## 2020-10-30 DIAGNOSIS — I2782 Chronic pulmonary embolism: Secondary | ICD-10-CM

## 2020-10-30 DIAGNOSIS — K7031 Alcoholic cirrhosis of liver with ascites: Secondary | ICD-10-CM

## 2020-10-30 DIAGNOSIS — E119 Type 2 diabetes mellitus without complications: Secondary | ICD-10-CM | POA: Insufficient documentation

## 2020-10-30 DIAGNOSIS — Z79899 Other long term (current) drug therapy: Secondary | ICD-10-CM | POA: Insufficient documentation

## 2020-10-30 DIAGNOSIS — Z7901 Long term (current) use of anticoagulants: Secondary | ICD-10-CM | POA: Insufficient documentation

## 2020-10-30 DIAGNOSIS — Z87891 Personal history of nicotine dependence: Secondary | ICD-10-CM | POA: Insufficient documentation

## 2020-10-30 DIAGNOSIS — I509 Heart failure, unspecified: Secondary | ICD-10-CM | POA: Insufficient documentation

## 2020-10-30 MED ORDER — ARTIFICIAL TEARS OPHTHALMIC OINT: TOPICAL_OINTMENT | Freq: Three times a day (TID) | OPHTHALMIC | 0 refills | Status: DC

## 2020-10-30 NOTE — Patient Instructions (Addendum)
[facility] [place], [place] [address] [phone]      Plan de alimentacin DASH DASH Eating Plan  DASH es la sigla en ingls de "Enfoques Alimentarios para Detener la Hipertensin" (Dietary Approaches to Stop Hypertension). El plan de alimentacin DASH ha demostrado bajar la presin arterial elevada (hipertensin). Tambin puede reducir [employer] de diabetes tipo 2, enfermedad cardaca y accidente cerebrovascular. Este plan tambin puede ayudar a Horticulturist, commercial. Consejos para seguir este plan  Pautas generales  Evite ingerir ms de 2,300 mg (miligramos) de sal (sodio) por da. Si tiene hipertensin, es posible que necesite reducir la ingesta de sodio a 1,500 mg por da.  Limite el consumo de alcohol a no ms de 37medida por da si es mujer y no est [place], y 39medidas por da si es hombre. Una medida equivale a 12oz (355ml) de cerveza, 5oz (153ml) de vino o 1oz (64ml) de bebidas alcohlicas de alta graduacin.  Trabaje con su mdico para mantener un peso saludable o perder [employer]. Pregntele cul es el peso recomendado para usted.  Realice al menos 30 minutos de ejercicio que haga que se acelere su corazn (ejercicio Arboriculturist) la [employer] de la [place]. Estas actividades pueden incluir caminar, nadar o andar en bicicleta.  Trabaje con su mdico o especialista en alimentacin y nutricin (nutricionista) para ajustar su plan alimentario a sus necesidades calricas personales. Lectura de las etiquetas de los alimentos   Verifique en las etiquetas de los alimentos, la cantidad de sodio por porcin. Elija alimentos con menos del 5 por ciento del valor diario de sodio. Generalmente, los alimentos con menos de 300 mg de sodio por porcin se encuadran dentro de este plan alimentario.  Para encontrar cereales integrales, busque la palabra "integral" como primera palabra en la lista de ingredientes. De compras  Compre productos en los que en su  etiqueta diga: "bajo contenido de sodio" o "sin agregado de sal".  Compre alimentos frescos. Evite los alimentos enlatados y comidas precocidas o congeladas. Coccin  Evite agregar sal cuando cocine. Use hierbas o aderezos sin sal, en lugar de sal de mesa o sal marina. Consulte al mdico o farmacutico antes de usar sustitutos de la sal.  No fra los alimentos. A la hora de cocinar los alimentos opte por hornearlos, hervirlos, grillarlos y asarlos a Administrator, arts.  Cocine con aceites cardiosaludables, como oliva, canola, soja o girasol. Planificacin de las comidas  Consuma una dieta equilibrada, que incluya lo siguiente: ? 5o ms porciones de frutas y Set designer. Trate de que la mitad del plato de cada comida sean frutas y verduras. ? Hasta 6 u 8 porciones de cereales integrales por da. ? Menos de 6 onzas de carne, aves o pescado Games developer. Una porcin de 3 onzas de carne tiene casi el mismo tamao que un mazo de cartas. Un huevo equivale a 1 onza. ? Dos porciones de productos lcteos descremados por Training and development officer. ? Una porcin de frutos secos, semillas o frijoles 5 veces por semana. ? Grasas cardiosaludables. Las grasas saludables llamadas cidos grasos omega-3 se encuentran en alimentos como semillas de lino y pescados de agua fra, como por ejemplo, sardinas, salmn y caballa.  Limite la cantidad que ingiere de los siguientes alimentos: ? Alimentos enlatados o envasados. ? Alimentos con alto contenido de grasa trans, como alimentos fritos. ? Alimentos con alto contenido de grasa saturada, como carne con grasa. ? Dulces, postres, bebidas azucaradas y otros alimentos con azcar  agregada. ? Productos lcteos enteros.  No le agregue sal a los alimentos antes de probarlos.  Trate de comer al menos 2 comidas vegetarianas por semana.  Consuma ms comida casera y menos de restaurante, de bufs y comida rpida.  Cuando coma en un restaurante, pida que preparen su comida con menos sal o,  en lo posible, sin nada de sal. Qu alimentos se recomiendan? Los alimentos enumerados a continuacin no constituyen Furniture conservator/restorer. Hable con el nutricionista sobre las mejores opciones alimenticias para usted. Cereales Pan de salvado o integral. Pasta de salvado o integral. Arroz integral. Avena. Quinua. Trigo burgol. Cereales integrales y con bajo contenido de sodio. Pan pita. Galletitas de [place] con bajo contenido de [place] y [place]. Tortillas de [place] integral. Verduras Verduras frescas o congeladas (crudas, al vapor, asadas o grilladas). Jugos de tomate y verduras con bajo contenido de sodio o reducidos en sodio. Salsa y pasta de tomate con bajo contenido de sodio o reducidas en sodio. Verduras enlatadas con bajo contenido de sodio o reducidas en sodio. Frutas Todas las frutas frescas, congeladas o disecadas. Frutas enlatadas en jugo natural (sin agregado de azcar). Carne y otros alimentos proteicos Pollo o pavo sin piel. Carne de pollo o de [place]. Cerdo desgrasado. Pescado y [employer]. Claras de huevo. Porotos, guisantes o lentejas secos. Frutos secos, mantequilla de frutos secos y semillas sin sal. Frijoles enlatados sin sal. Cortes de carne vacuna magra, desgrasada. Embutidos magros, con bajo contenido de [place]. Lcteos Leche descremada (1%) o descremada. Quesos sin grasa, con bajo contenido de grasa o descremados. Queso blanco o ricota sin grasa, con bajo contenido de [place]. Yogur semidescremado o descremado. Queso con bajo contenido de [place] y [place]. Grasas y [employer] untables que no contengan grasas trans. Aceite vegetal. Lubertha Basque y aderezos para ensaladas livianos o con bajo contenido de grasas (reducidos en sodio). Aceite de canola, crtamo, oliva, soja y [place]. [place]. Condimentos y otros alimentos Hierbas. Especias. Mezclas de condimentos sin sal. Palomitas de maz y pretzels sin sal. Dulces con bajo contenido de grasas. Qu alimentos no se recomiendan? Los  alimentos enumerados a continuacin no constituyen Furniture conservator/restorer. Hable con el nutricionista sobre las mejores opciones alimenticias para usted. Cereales Productos de panificacin hechos con grasa, como medialunas, magdalenas y algunos panes. Comidas con arroz o pasta seca listas para usar. Verduras Verduras con crema o fritas. Verduras en [place]. Verduras enlatadas regulares (que no sean con bajo contenido de sodio o reducidas en sodio). Pasta y salsa de tomates enlatadas regulares (que no sean con bajo contenido de sodio o reducidas en sodio). Jugos de tomate y verduras regulares (que no sean con bajo contenido de sodio o reducidos en sodio). Pepinillos. Aceitunas. Lambert Mody Fruta enlatada en almbar liviano o espeso. Frutas cocidas en aceite. Frutas con salsa de crema o [place]. Carne y otros alimentos proteicos Cortes de carne con grasa. Costillas. Carne frita. [place]. Salchichas. Mortadela y otras carnes procesadas. Salame. Panceta. Perros calientes (hotdogs). [place]. Frutos secos y semillas con sal. Frijoles enlatados con agregado de sal. Pescado enlatado o ahumado. Huevos enteros o yemas. Pollo o pavo con piel. Lcteos Leche entera o al 2%, crema y mitad leche y mitad crema. Queso crema entero o con toda su grasa. Yogur entero o endulzado. Quesos con toda su grasa. Sustitutos de cremas no lcteas. Coberturas batidas. Quesos para untar y quesos procesados. Grasas y [employer]. Margarina en barra. [place]. Materia grasa. Mantequilla clarificada. Grasa de panceta. Aceites  tropicales como aceite de coco, palmiste o palma. Condimentos y otros alimentos Palomitas de maz y pretzels con sal. Sal de cebolla, sal de ajo, sal condimentada, sal de mesa y sal marina. Salsa [place]. Salsa trtara. Salsa barbacoa. Salsa teriyaki. Salsa de soja, incluso la que tiene contenido reducido de [place]. Salsa de carne. Salsas en lata y envasadas. Salsa de pescado. Salsa  de [place]. Salsa rosada. Rbano picante envasado. [place]. Mostaza. Saborizantes y tiernizantes para carne. Caldo en cubitos. Salsa picante y salsa tabasco. Escabeches envasados o ya preparados. Aderezos para tacos prefabricados o envasados. Salsas. Aderezos comunes para ensalada. Dnde encontrar ms informacin:  [place], los Pulmones y Herbalist (National Heart, Lung, and [place]): https://wilson-eaton.com/  Asociacin Estadounidense del Corazn (American Heart Association): www.heart.org Resumen  El plan de alimentacin DASH ha demostrado bajar la presin arterial elevada (hipertensin). Tambin puede reducir UnitedHealth de diabetes tipo 2, enfermedad cardaca y accidente cerebrovascular.  Con el plan de alimentacin DASH, deber limitar el consumo de sal (sodio) a 2,300 mg por da. Si tiene hipertensin, es posible que necesite reducir la ingesta de sodio a 1,500 mg por da.  Cuando siga el plan de alimentacin DASH, trate de comer ms frutas frescas y verduras, cereales integrales, carnes magras, lcteos descremados y grasas cardiosaludables.  Trabaje con su mdico o especialista en alimentacin y nutricin (nutricionista) para ajustar su plan alimentario a sus necesidades calricas personales. Esta informacin no tiene Marine scientist el consejo del mdico. Asegrese de hacerle al mdico cualquier pregunta que tenga. Document Revised: 04/06/2017 Document Reviewed: 04/06/2017 Elsevier Patient Education  [place].   Extirpacin de terigion Pterygium Excision  La extirpacin de terigion es una ciruga para eliminar un terigion, que es un crecimiento carnoso no canceroso (benigno) en la superficie de adelante del ojo. Un terigion comienza en el tejido transparente externo del ojo (conjuntiva) y se expande hasta el tejido transparente (crnea) que cubre la parte coloreada del ojo (iris). En los casos graves, el terigion crece lo suficiente como para cubrir  la zona negra del centro del ojo (pupila). Es posible que necesite esta ciruga si el terigion causa molestias o afecta la visin, y otros tratamientos no funcionan. Otra de las razones para llevar a cabo esta ciruga es mejorar el aspecto del ojo (ciruga esttica). Informe al mdico acerca de lo siguiente:  Cualquier alergia que tenga.  Todos los [employer], incluidos vitaminas, hierbas, gotas oftlmicas, cremas y medicamentos de venta libre.  Cualquier problema que usted o sus familiares hayan tenido con anestsicos.  Cualquier enfermedad de la sangre que tenga.  Cualquier ciruga previa a la que se haya sometido, como la queratectoma fotorrefractiva (photorefractive keratectomy o PRK) o la queratomileusis in situ asistida por lser (laser-assisted in situ keratomileusis o LASIK).  Cualquier afeccin mdica que tenga.  Si est embarazada o podra estarlo. Cules son los riesgos? En general, se trata de un procedimiento seguro. Sin embargo, pueden ocurrir complicaciones, por ejemplo:  Reaparicin del terigion despus de la ciruga.  Dolor en el ojo.  Cambios en la visin, como visin borrosa debido a los cambios en la forma del ojo (astigmatismo).  Sensacin de [employer].  Formacin de cicatrices en el ojo (granuloma). Qu ocurre antes del procedimiento? Mantenerse hidratado Siga las indicaciones del mdico acerca de mantenerse hidratado, las cuales pueden incluir lo siguiente:  Hasta 2horas antes del procedimiento, puede beber lquidos transparentes, como agua, jugos frutales transparentes, caf negro y t solo.  Restricciones en las comidas y bebidas Siga las indicaciones del mdico respecto de las comidas y bebidas, las cuales pueden incluir lo siguiente:  Ocho horas antes del procedimiento, deje de ingerir comidas o alimentos pesados, por ejemplo, carne, alimentos fritos o alimentos grasos.  Seis horas antes del procedimiento, deje de ingerir  comidas o alimentos livianos, como tostadas o cereales.  Seis horas antes del procedimiento, deje de beber [place] o bebidas que [employer].  Dos horas antes del procedimiento, deje de beber lquidos transparentes. Instrucciones generales  Consulte al mdico sobre: ? Quarry manager o suspender los medicamentos que toma habitualmente. Esto es muy importante si toma medicamentos para la diabetes o anticoagulantes. ? Tomar medicamentos como aspirina e ibuprofeno. Estos medicamentos pueden tener un efecto anticoagulante en la [place]. No tome estos medicamentos a menos que el mdico se lo indique. ? Tomar medicamentos de [employer], vitaminas, hierbas y suplementos.  Haga que alguien lo lleve a su casa desde el hospital o la clnica.  Pdale a un adulto responsable que lo cuide durante al menos 24horas despus de que le den el alta del hospital o de la [place]. Esto es importante. Qu ocurre durante el procedimiento?  Le colocarn una va intravenosa en una de las venas.  Le administrarn un medicamento para adormecer el ojo (anestesia local). Tambin le administrarn un medicamento para ayudarla a relajarse (sedante).  Le colocarn un dispositivo para mantener el ojo abierto (espculo para prpados).  El terigion se eliminar del ojo.  Pueden colocarle un fragmento de tejido ocular (injerto) en la superficie donde se extirp el terigion. Este injerto puede extraerse de la conjuntiva, en una zona diferente del globo ocular.  El injerto se podr Optician, dispensing con pequeos puntos que se disolvern con el tiempo (suturas absorbibles) o con un tipo de pegamento, o ambos.  Se cerrar el ojo y se cubrir con un parche. Este procedimiento puede variar segn el mdico y el hospital. Sander Nephew ocurre despus del procedimiento?  Le controlarn la presin arterial, la frecuencia cardaca, la frecuencia respiratoria y Retail buyer de oxgeno en la sangre hasta que abandone el hospital o la  clnica.  Le darn analgsicos si los necesita.  Tendr que usar el parche como se lo haya indicado el mdico.  No conduzca hasta que el mdico lo autorice. Resumen  Un terigion es un crecimiento carnoso no canceroso (benigno) en la superficie de adelante del ojo.  La ciruga de extirpacin puede ser necesaria si el terigion causa molestias o afecta la visin.  Despus de la ciruga se cubrir el ojo con un parche. selo como se lo haya indicado el mdico. Esta informacin no tiene Marine scientist el consejo del mdico. Asegrese de hacerle al mdico cualquier pregunta que tenga. Document Revised: 02/09/2018 Document Reviewed: 02/09/2018 Elsevier Patient Education  2020 [employer].

## 2020-10-30 NOTE — Progress Notes (Signed)
Patient ID: Collin Byrd, male    DOB: 02-Aug-1961, 59 y.o.   MRN: [id number]  HPI  [facility] interpreter present during entire visit   Mr Achord is a 59 y/o male with a history of DM, cataract, PE, cirrhosis, previous tobacco/ alcohol use and chronic heart failure.   Echo report from 06/05/20 reviewed and showed an EF of >55% along with moderate LVH.   Admitted 06/05/20 due to shortness of breath due to acute PE. Cardiology, nephrology, GI and palliative care consults obtained. Needed oxygen but able to be weaned completely off of it. Paracentesis done with removal of 2.5L. Placed on lasix and aldactone. Antibiotic course completed due to clinical sepsis. Discharged after 16 days.   He presents today with a chief complaint of a follow-up visit. He reports he is doing well overall and has not been drinking any alcohol. Denies any shortness of breath, chest pain, N/V/D. Reports his sleep has been improving and that he has had no swelling to his legs or abdomen. Patient reports he is attempting to get a surgery done for his pterygium that is starting to impede his vision.   Past Medical History:  Diagnosis Date  . Cataract   . CHF (congestive heart failure) ([place])   . Cirrhosis ([place])   . Diabetes mellitus without complication ([place])   . Pulmonary embolus [facility])    Past Surgical History:  Procedure Laterality Date  . CATARACT EXTRACTION     No family history on file. Social History   Tobacco Use  . Smoking status: Former Smoker    Packs/day: 0.25    Types: Cigarettes  . Smokeless tobacco: Never Used  Substance Use Topics  . Alcohol use: Not Currently   No Known Allergies  Prior to Admission medications   Medication Sig Start Date End Date Taking? Authorizing Provider  blood glucose meter kit and supplies KIT Dispense based on patient and insurance preference. Use up to four times daily as directed. (FOR ICD-9 250.00, 250.01). 07/12/20  Yes Iloabachie, Chioma E, NP  ferrous sulfate  (FEROSUL) 325 (65 FE) MG tablet TAKE ONE TABLET BY MOUTH EVERY DAY WITH BREAKFAST. 09/26/20  Yes Iloabachie, Chioma E, NP  folic acid (FOLVITE) 1 MG tablet Take 1 tablet (1 mg total) by mouth daily. 09/26/20  Yes Iloabachie, Chioma E, NP  furosemide (LASIX) 40 MG tablet Take 1 tablet (40 mg total) by mouth daily. 08/09/20  Yes Iloabachie, Chioma E, NP  glimepiride (AMARYL) 1 MG tablet Take 1 tablet (1 mg total) by mouth daily with breakfast. 09/26/20  Yes Iloabachie, Chioma E, NP  pantoprazole (PROTONIX) 40 MG tablet Take 1 tablet (40 mg total) by mouth daily. 09/26/20  Yes Iloabachie, Chioma E, NP  rivaroxaban (XARELTO) 20 MG TABS tablet Take 1 tablet (20 mg total) by mouth daily with supper. 07/24/20 11/21/20 Yes Cammie Sickle, MD  spironolactone (ALDACTONE) 100 MG tablet Take 1 tablet (100 mg total) by mouth 2 (two) times daily. 08/09/20  Yes Iloabachie, Chioma E, NP    Review of Systems  Constitutional: Negative for activity change, appetite change, diaphoresis and fatigue.  HENT: Negative for congestion, postnasal drip and sore throat.   Eyes: Positive for visual disturbance (due to pterygium).  Respiratory: Negative for cough, chest tightness and shortness of breath.   Cardiovascular: Negative for chest pain, palpitations and leg swelling.  Gastrointestinal: Negative for abdominal distention and abdominal pain.  Endocrine: Negative.   Genitourinary: Negative.   Musculoskeletal: Negative for back pain, gait problem  and neck pain.  Skin: Negative.   Allergic/Immunologic: Negative.   Neurological: Negative for dizziness and light-headedness.  Hematological: Negative for adenopathy. Bruises/bleeds easily.  Psychiatric/Behavioral: Positive for sleep disturbance (sleeping on 1 pillow). Negative for dysphoric mood. The patient is not nervous/anxious.    Vitals:   10/30/20 1203  BP: 115/77  Pulse: 93  Resp: 20  SpO2: 99%  Weight: 190 lb 8 oz (86.4 kg)  Height: 5' 7"  (1.702 m)   Wt  Readings from Last 3 Encounters:  10/30/20 190 lb 8 oz (86.4 kg)  10/25/20 191 lb 6.4 oz (86.8 kg)  10/23/20 191 lb 6.4 oz (86.8 kg)   Lab Results  Component Value Date   CREATININE 0.90 10/23/2020   CREATININE 1.08 07/24/2020   CREATININE 0.79 06/21/2020    Physical Exam Vitals and nursing note reviewed.  Constitutional:      Appearance: Normal appearance.  HENT:     Head: Normocephalic and atraumatic.  Cardiovascular:     Rate and Rhythm: Normal rate and regular rhythm.  Pulmonary:     Effort: Pulmonary effort is normal. No respiratory distress.     Breath sounds: No wheezing or rales.  Abdominal:     General: There is no distension.     Palpations: Abdomen is soft.     Tenderness: There is no abdominal tenderness.  Musculoskeletal:        General: No tenderness.     Cervical back: Normal range of motion.     Right lower leg: No edema.     Left lower leg: No edema.  Skin:    General: Skin is warm and dry.  Neurological:     General: No focal deficit present.     Mental Status: He is alert and oriented to person, place, and time.  Psychiatric:        Mood and Affect: Mood normal.        Behavior: Behavior normal.        Thought Content: Thought content normal.    Assessment & Plan:  1: Chronic heart failure with preserved ejection fraction with structural changes (LVH)- - NYHA class I - euvolemic today - weighing daily; reminded to call for an overnight weight gain of >2 pounds or a weekly weight gain of >5 pounds; home weight chart was reviewed, no sudden weight gain noted -Weight stable from last visit -Patient keeps a log of his weights and brings them to his visits - not adding salt to his food  - Patient tolerating Entresto without any reported side effects - BMP 10/23/20 reviewed and showed sodium 138, potassium 4.4, creatinine 0.90 and GFR >60 - BNP 06/05/20 was 155.9  2: DM- - saw PCP @ [facility] 10/25/20 - glucose at home today was 123 - A1c  09/12/20 was 5.4% -Patient keeps a daily log of blood glucose levels and monitors them closely  3: Alcoholic cirrhosis- - paracentesis done 06/06/20 with removal of 2.5L - says that he hasn't had any alcohol since hospitalization, confirms today that he has not been drinking since June 2021.  4: PE- - saw hematology Rogue Bussing) 07/24/20 - taking xarelto    Medication bottles were reviewed.   Return in 3 month or sooner for any questions/problems before then.

## 2020-11-01 ENCOUNTER — Ambulatory Visit
Admission: RE | Admit: 2020-11-01 | Discharge: 2020-11-01 | Disposition: A | Discharge status: Home / Self Care | Payer: Self-pay | Source: Ambulatory Visit | Attending: Internal Medicine | Admitting: Internal Medicine

## 2020-11-01 ENCOUNTER — Other Ambulatory Visit: Payer: Self-pay

## 2020-11-01 DIAGNOSIS — R590 Localized enlarged lymph nodes: Secondary | ICD-10-CM | POA: Insufficient documentation

## 2020-11-01 DIAGNOSIS — J189 Pneumonia, unspecified organism: Secondary | ICD-10-CM | POA: Insufficient documentation

## 2020-11-01 DIAGNOSIS — I2699 Other pulmonary embolism without acute cor pulmonale: Secondary | ICD-10-CM | POA: Insufficient documentation

## 2020-11-01 MED ORDER — IOHEXOL 300 MG/ML  SOLN
75.0000 mL | Freq: Once | INTRAMUSCULAR | Status: AC | PRN
  Administered 2020-11-01: 75 mL via INTRAVENOUS

## 2020-11-09 ENCOUNTER — Telehealth: Payer: Self-pay | Admitting: Internal Medicine

## 2020-11-09 NOTE — Telephone Encounter (Signed)
On 11/08-spoke to patient/wife regarding results of the CT scan-shows resolution of the pneumonia; PE-  Improved-however will need continued anticoagulation for now.  Lung nodule in follow-up.   Follow-up as planned.

## 2020-11-12 ENCOUNTER — Other Ambulatory Visit: Payer: Self-pay | Admitting: Gerontology

## 2020-11-12 ENCOUNTER — Other Ambulatory Visit: Payer: Self-pay | Admitting: Internal Medicine

## 2020-11-12 DIAGNOSIS — Z8679 Personal history of other diseases of the circulatory system: Secondary | ICD-10-CM

## 2020-11-14 ENCOUNTER — Other Ambulatory Visit: Payer: Self-pay

## 2020-11-14 DIAGNOSIS — I2699 Other pulmonary embolism without acute cor pulmonale: Secondary | ICD-10-CM

## 2020-11-14 DIAGNOSIS — E1165 Type 2 diabetes mellitus with hyperglycemia: Secondary | ICD-10-CM

## 2020-11-14 DIAGNOSIS — R590 Localized enlarged lymph nodes: Secondary | ICD-10-CM

## 2020-11-14 DIAGNOSIS — Z0000 Encounter for general adult medical examination without abnormal findings: Secondary | ICD-10-CM

## 2020-11-14 DIAGNOSIS — J189 Pneumonia, unspecified organism: Secondary | ICD-10-CM

## 2020-11-15 ENCOUNTER — Telehealth: Payer: Self-pay

## 2020-11-15 LAB — BASIC METABOLIC PANEL
BUN/Creatinine Ratio: 13 (ref 9–20)
BUN: 12 mg/dL (ref 6–24)
CO2: 23 mmol/L (ref 20–29)
Calcium: 9.7 mg/dL (ref 8.7–10.2)
Chloride: 103 mmol/L (ref 96–106)
Creatinine, Ser: 0.92 mg/dL (ref 0.76–1.27)
GFR calc Af Amer: 106 mL/min/{1.73_m2} (ref >59)
GFR calc non Af Amer: 92 mL/min/{1.73_m2} (ref >59)
Glucose: 105 mg/dL — ABNORMAL HIGH (ref 65–99)
Potassium: 4.1 mmol/L (ref 3.5–5.2)
Sodium: 139 mmol/L (ref 134–144)

## 2020-11-15 LAB — CBC WITH DIFFERENTIAL/PLATELET
Basophils Absolute: 0 10*3/uL (ref 0.0–0.2)
Basos: 1 %
EOS (ABSOLUTE): 0.1 10*3/uL (ref 0.0–0.4)
Eos: 2 %
Hematocrit: 36.1 % — ABNORMAL LOW (ref 37.5–51.0)
Hemoglobin: 12.4 g/dL — ABNORMAL LOW (ref 13.0–17.7)
Immature Grans (Abs): 0 10*3/uL (ref 0.0–0.1)
Immature Granulocytes: 1 %
Lymphocytes Absolute: 0.8 10*3/uL (ref 0.7–3.1)
Lymphs: 12 %
MCH: 31.9 pg (ref 26.6–33.0)
MCHC: 34.3 g/dL (ref 31.5–35.7)
MCV: 93 fL (ref 79–97)
Monocytes Absolute: 0.6 10*3/uL (ref 0.1–0.9)
Monocytes: 8 %
Neutrophils Absolute: 5.1 10*3/uL (ref 1.4–7.0)
Neutrophils: 76 %
Platelets: 235 10*3/uL (ref 150–450)
RBC: 3.89 x10E6/uL — ABNORMAL LOW (ref 4.14–5.80)
RDW: 13.2 % (ref 11.6–15.4)
WBC: 6.6 10*3/uL (ref 3.4–10.8)

## 2020-11-15 LAB — HEMOGLOBIN A1C
Est. average glucose Bld gHb Est-mCnc: 111 mg/dL
Hgb A1c MFr Bld: 5.5 % (ref 4.8–5.6)

## 2020-11-15 LAB — MICROALBUMIN / CREATININE URINE RATIO
Creatinine, Urine: 9.4 mg/dL
Microalbumin, Urine: 6.4 ug/mL

## 2020-11-15 LAB — LIPID PANEL
Chol/HDL Ratio: 3.3 ratio (ref 0.0–5.0)
Cholesterol, Total: 183 mg/dL (ref 100–199)
HDL: 55 mg/dL (ref >39)
LDL Chol Calc (NIH): 112 mg/dL — ABNORMAL HIGH (ref 0–99)
Triglycerides: 87 mg/dL (ref 0–149)
VLDL Cholesterol Cal: 16 mg/dL (ref 5–40)

## 2020-11-15 NOTE — Telephone Encounter (Signed)
Called patient and asked him if he was interested in screening for the [facility] program and patient was not interested.

## 2020-11-28 ENCOUNTER — Other Ambulatory Visit: Payer: Self-pay

## 2020-11-28 ENCOUNTER — Encounter: Payer: Self-pay | Admitting: Gerontology

## 2020-11-28 ENCOUNTER — Ambulatory Visit: Payer: Self-pay | Admitting: Gerontology

## 2020-11-28 ENCOUNTER — Encounter: Payer: Self-pay | Admitting: Gastroenterology

## 2020-11-28 ENCOUNTER — Ambulatory Visit (INDEPENDENT_AMBULATORY_CARE_PROVIDER_SITE_OTHER): Payer: Self-pay | Admitting: Gastroenterology

## 2020-11-28 VITALS — BP 107/72 | HR 72 | Wt 190.7 lb

## 2020-11-28 VITALS — BP 113/68 | HR 82 | Temp 97.8°F | Wt 190.2 lb

## 2020-11-28 DIAGNOSIS — D649 Anemia, unspecified: Secondary | ICD-10-CM

## 2020-11-28 DIAGNOSIS — E1169 Type 2 diabetes mellitus with other specified complication: Secondary | ICD-10-CM

## 2020-11-28 DIAGNOSIS — F1011 Alcohol abuse, in remission: Secondary | ICD-10-CM

## 2020-11-28 DIAGNOSIS — K652 Spontaneous bacterial peritonitis: Secondary | ICD-10-CM

## 2020-11-28 DIAGNOSIS — K7030 Alcoholic cirrhosis of liver without ascites: Secondary | ICD-10-CM

## 2020-11-28 DIAGNOSIS — K7031 Alcoholic cirrhosis of liver with ascites: Secondary | ICD-10-CM

## 2020-11-28 DIAGNOSIS — I2699 Other pulmonary embolism without acute cor pulmonale: Secondary | ICD-10-CM

## 2020-11-28 DIAGNOSIS — I8500 Esophageal varices without bleeding: Secondary | ICD-10-CM

## 2020-11-28 DIAGNOSIS — Z1211 Encounter for screening for malignant neoplasm of colon: Secondary | ICD-10-CM

## 2020-11-28 MED ORDER — FISH OIL 1000 MG PO CAPS: 2.0000 | ORAL_CAPSULE | Freq: Two times a day (BID) | ORAL | 2 refills | Status: DC

## 2020-11-28 NOTE — Progress Notes (Signed)
Collin Darby, MD [address]  [place]  [place], [place] [address]  Main: [phone]  Fax: [phone]    Gastroenterology Consultation  Referring Provider:     Langston Reusing, NP Primary Care Physician:  Collin Reusing, NP Primary Gastroenterologist:  Dr. Cephas Byrd Reason for Consultation:     Cirrhosis of liver        HPI:   Collin Byrd is a 59 y.o. male referred by Dr. Langston Reusing, NP  for consultation & management of cirrhosis of liver.  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  He also has history of pneumonia and pulmonary embolism, is currently on anticoagulation.  Patient is followed by hematologist, Dr. Rogue Byrd.  Patient underwent therapeutic paracentesis in June 2021, was diagnosed with SBP as well as fluid analysis consistent with portal hypertension.  He was treated with antibiotics and was discharged home on Lasix as well as spironolactone.  Patient stopped drinking alcohol since June and is adherent to his medications.  He reports doing well since then.  He denies swelling of abdomen, distention of abdomen, swelling of legs.  He denies black stools, rectal bleeding, hematemesis or abdominal pain.  His most recent labs revealed mild normocytic anemia, normal LFTs and renal function.  He is currently on oral iron, thiamine and folate. He is on disability, currently not working  CT chest 11/01/2020 revealed resolution of prior pulmonary embolus  NSAIDs: None  Antiplts/Anticoagulants/Anti thrombotics: Xarelto for history of PE  GI Procedures: None He denies family history of GI malignancy, liver cancer  Past Medical History:  Diagnosis Date  . Cataract   . CHF (congestive heart failure) ([place])   . Cirrhosis ([place])   . Diabetes mellitus without complication ([place])   . Pulmonary embolus [facility])     Past Surgical History:  Procedure Laterality Date  . CATARACT EXTRACTION       Current Outpatient Medications:  .  artificial tears (LACRILUBE) OINT ophthalmic ointment, Place into the right eye 3 (three) times daily., Disp: 1 g, Rfl: 0 .  blood glucose meter kit and supplies KIT, Dispense based on patient and insurance preference. Use up to four times daily as directed. (FOR ICD-9 250.00, 250.01)., Disp: 1 each, Rfl: 0 .  ferrous sulfate (FEROSUL) 325 (65 FE) MG tablet, TAKE ONE TABLET BY MOUTH EVERY DAY WITH BREAKFAST., Disp: 30 tablet, Rfl: 2 .  folic acid (FOLVITE) 1 MG tablet, Take 1 tablet (1 mg total) by mouth daily., Disp: 30 tablet, Rfl: 2 .  furosemide (LASIX) 40 MG tablet, Take 1 tablet (40 mg total) by mouth daily., Disp: 30 tablet, Rfl: 2 .  glimepiride (AMARYL) 1 MG tablet, Take 1 tablet (1 mg total) by mouth daily with breakfast., Disp: 30 tablet, Rfl: 1 .  Omega-3 Fatty Acids (FISH OIL) 1000 MG CAPS, Take 2 capsules (2,000 mg total) by mouth in the morning and at bedtime., Disp: 60 capsule, Rfl: 2 .  pantoprazole (PROTONIX) 40 MG tablet, Take 1 tablet (40 mg total) by mouth daily., Disp: 30 tablet, Rfl: 2 .  sacubitril-valsartan (ENTRESTO) 24-26 MG, Take 1 tablet by mouth 2 (two) times daily., Disp: 180 tablet, Rfl: 3 .  spironolactone (ALDACTONE) 100 MG tablet, Take 1 tablet (100 mg total) by mouth 2 (two) times daily., Disp: 60 tablet, Rfl: 2 .  thiamine (VITAMIN B-1) 100 MG tablet, Take 100 mg by mouth daily., Disp: , Rfl:  .  XARELTO 20  MG TABS tablet, TAKE ONE TABLET BY MOUTH EVERY DAY WITH SUPPER, Disp: 90 tablet, Rfl: 0  History reviewed. No pertinent family history.   Social History   Tobacco Use  . Smoking status: Former Smoker    Packs/day: 0.25    Types: Cigarettes  . Smokeless tobacco: Never Used  Vaping Use  . Vaping Use: Never used  Substance Use Topics  . Alcohol use: Not Currently  . Drug use: Never    Allergies as of 11/28/2020  . (No Known Allergies)    Review of Systems:    All systems reviewed and negative except  where noted in HPI.   Physical Exam:  BP 113/68   Pulse 82   Temp 97.8 F (36.6 C)   Wt 190 lb 3.2 oz (86.3 kg)   BMI 29.79 kg/m  No LMP for male patient.  General:   Alert,  Well-developed, well-nourished, pleasant and cooperative in NAD Head:  Normocephalic and atraumatic. Eyes:  Sclera clear, no icterus.   Conjunctiva pink.  Congested right eye Ears:  Normal auditory acuity. Nose:  No deformity, discharge, or lesions. Mouth:  No deformity or lesions,oropharynx pink & moist. Neck:  Supple; no masses or thyromegaly. Lungs:  Respirations even and unlabored.  Clear throughout to auscultation.   No wheezes, crackles, or rhonchi. No acute distress. Heart:  Regular rate and rhythm; no murmurs, clicks, rubs, or gallops. Abdomen:  Normal bowel sounds. Soft, non-tender and non-distended without masses, hepatosplenomegaly or hernias noted.  No guarding or rebound tenderness.   Rectal: Not performed Msk:  Symmetrical without gross deformities. Good, equal movement & strength bilaterally. Pulses:  Normal pulses noted. Extremities:  No clubbing or edema.  No cyanosis. Neurologic:  Alert and oriented x3;  grossly normal neurologically. Skin:  Intact without significant lesions or rashes. No jaundice. Psych:  Alert and cooperative. Normal mood and affect.  Imaging Studies: Reviewed  Assessment and Plan:   Collin Byrd is a 59 y.o. Hispanic male with diabetes, decompensated alcoholic cirrhosis with ascites, history of SBP, pneumonia and pulmonary embolism on Xarelto is seen in consultation for cirrhosis  Alcoholic cirrhosis of liver, child Pugh class B, low meld Viral hepatitis panel negative Mild normocytic anemia, and coagulopathy, no evidence of thrombocytopenia Ascites: History of SBP, s/p therapeutic paracentesis in 6/21, consistent with portal hypertension Continue Lasix 40 mg daily and spironolactone 100 mg daily Continue low-sodium diet Recommend EGD for variceal  screening [place] screening: AFP levels normal and CT did not reveal liver lesions in 6/21.  Recommend right upper quadrant ultrasound in 6/22 PSE: None Continue to remain abstinent from alcohol use  Colon cancer screening Recommend colonoscopy Will obtain blood thinner request clearance from Dr. Rogue Byrd for interruption of Xarelto preprocedure   Follow up in 3 months   Collin Darby, MD

## 2020-11-28 NOTE — Progress Notes (Signed)
Patient: Collin Byrd Male    DOB: August 06, 1961   59 y.o.   MRN: [id number] Visit Date: 11/28/2020  Today's Provider: Langston Reusing, NP   Chief Complaint  Patient presents with  . Follow-up   Subjective:    HPI This is a 59 y/o Hispanic male presenting for routine f/u pulmonary embolism, CHF, cirrhosis due to alcohol abuse, T2DM and iron deficiency anemia. He has no specific complaints. He checks his sugar daily and this morning 162m/dl. The highest reading he has gotten since last visit was 1259mdl. No hypoglycemic episodes reported.  He is still waiting for his prescription from the pharmaceutical company for [place] He denies any abdominal swelling, lower extremity edema, chest pain, shortness of breath, abdominal pain, nausea and vomiting.  Overall he reports doing well.  His last lipid panel showed an LDL of 112.  The rest of his lipid panel was unremarkable.  LFTs with last checked in July and were normal.  He is not currently on a statin.  He has a follow-up appointment with gastroenterology today.  He reports continuous alcohol abstinence.     No Known Allergies Previous Medications   ARTIFICIAL TEARS (LACRILUBE) OINT OPHTHALMIC OINTMENT    Place into the right eye 3 (three) times daily.   BLOOD GLUCOSE METER KIT AND SUPPLIES KIT    Dispense based on patient and insurance preference. Use up to four times daily as directed. (FOR ICD-9 250.00, 250.01).   FERROUS SULFATE (FEROSUL) 325 (65 FE) MG TABLET    TAKE ONE TABLET BY MOUTH EVERY DAY WITH BREAKFAST.   FOLIC ACID (FOLVITE) 1 MG TABLET    Take 1 tablet (1 mg total) by mouth daily.   FUROSEMIDE (LASIX) 40 MG TABLET    Take 1 tablet (40 mg total) by mouth daily.   GLIMEPIRIDE (AMARYL) 1 MG TABLET    Take 1 tablet (1 mg total) by mouth daily with breakfast.   PANTOPRAZOLE (PROTONIX) 40 MG TABLET    Take 1 tablet (40 mg total) by mouth daily.   SACUBITRIL-VALSARTAN (ENTRESTO) 24-26 MG    Take 1 tablet by mouth 2 (two) times  daily.   SPIRONOLACTONE (ALDACTONE) 100 MG TABLET    Take 1 tablet (100 mg total) by mouth 2 (two) times daily.   THIAMINE (VITAMIN B-1) 100 MG TABLET    Take 100 mg by mouth daily.   XARELTO 20 MG TABS TABLET    TAKE ONE TABLET BY MOUTH EVERY DAY WITH SUPPER    Review of Systems  Constitutional: Negative.   Respiratory: Negative.   Cardiovascular: Negative.   Gastrointestinal: Negative.   Endocrine: Negative.   Genitourinary: Negative.   Musculoskeletal: Negative.   Skin: Negative.   Neurological: Negative.   Hematological: Negative.   Psychiatric/Behavioral: Negative.     Social History   Tobacco Use  . Smoking status: Former Smoker    Packs/day: 0.25    Types: Cigarettes  . Smokeless tobacco: Never Used  Substance Use Topics  . Alcohol use: Not Currently   Objective:   BP 107/72 (BP Location: Right Arm, Patient Position: Sitting)   Pulse 72   Wt 190 lb 11.2 oz (86.5 kg)   SpO2 97%   BMI 29.87 kg/m   Physical Exam Vitals and nursing note reviewed.  Constitutional:      Appearance: Normal appearance.  HENT:     Head: Normocephalic and atraumatic.     Nose: Nose normal.     Mouth/Throat:     Mouth:  Mucous membranes are moist.  Eyes:     Extraocular Movements: Extraocular movements intact.     Conjunctiva/sclera: Conjunctivae normal.     Pupils: Pupils are equal, round, and reactive to light.  Cardiovascular:     Rate and Rhythm: Normal rate and regular rhythm.     Pulses: Normal pulses.     Heart sounds: Normal heart sounds.  Abdominal:     General: Abdomen is flat. Bowel sounds are normal.     Palpations: Abdomen is soft.  Musculoskeletal:        General: Normal range of motion.     Cervical back: Normal range of motion and neck supple.  Skin:    General: Skin is warm.  Neurological:     General: No focal deficit present.     Mental Status: He is alert.  Psychiatric:        Mood and Affect: Mood normal.         Assessment & Plan:   1.  Hyperlipidemia associated with type 2 diabetes mellitus ([place]) Ideally patient be started on a statin given his history of diabetes.  However given his history of alcoholic hepatitis we will hold off on starting him on a statin.  We will repeat his lipid panel in 3 months.  We will start him on fish oil 2 g twice a day.  Principles of low-fat diet reviewed with patient.  Encouraged to continue abstaining from alcohol. - Lipid Profile; Future - HgB A1c; Future - Comp Met (CMET); Future - CBC w/Diff  2. Alcohol abuse, in remission Reports continues abstinence.  Relapse prevention reviewed.  He is to continue follow-up with GI as scheduled - Comp Met (CMET); Future  3. Alcoholic cirrhosis, unspecified whether ascites present ([place]) No signs and symptoms of ascites.  He is on Aldactone 100 mg twice a day and furosemide 40 mg daily.  Continue current regimen and follow-up with gastroenterology as scheduled.  4. Type 2 diabetes mellitus with other specified complication, without long-term current use of insulin (H[facility]) Glycemic control is optimal.  Hemoglobin A1c less than 6%.  Continue current medications.  Will repeat hemoglobin A1c in 3 months - Lipid Profile; Future - HgB A1c; Future - Comp Met (CMET); Future - CBC w/Diff  5. Acute pulmonary embolism, unspecified pulmonary embolism type, unspecified whether acute cor pulmonale present (HCC) Continue Xarelto 20 mg daily.  PE is unprovoked.  Per hematooncology, continue anticoagulation till June 2022.  Will obtain hypercoagulable work-up at the time.  6. Anemia, unspecified type He denies any bloody stools.  This is probably iron and folate deficiency anemia due to alcohol abuse.  Now that patient is in remission we will follow his hemoglobin closely and repeat his anemia panel in 3 months.   Mirabel Ahlgren S. Tukov ANP-BC  NB: This document was prepared using Systems analyst and may include unintentional dictation  errors.    [facility]

## 2020-12-03 ENCOUNTER — Telehealth: Payer: Self-pay

## 2020-12-03 NOTE — Telephone Encounter (Signed)
Per Dr. Rogue Bussing patient needs to stop the Xarelto 3 days before procedure and restart it 2 days after procedure.   Called patient with the interpreter line ID [id number]. The interpreter service had to leave a message for call back

## 2020-12-03 NOTE — Telephone Encounter (Signed)
Used the interpreter service and patient and patient wife verbalized understanding of instructions

## 2020-12-06 ENCOUNTER — Other Ambulatory Visit: Payer: Self-pay

## 2020-12-06 ENCOUNTER — Ambulatory Visit: Payer: Self-pay | Admitting: Gerontology

## 2020-12-10 ENCOUNTER — Other Ambulatory Visit: Payer: Self-pay | Admitting: Gerontology

## 2020-12-10 DIAGNOSIS — Z8679 Personal history of other diseases of the circulatory system: Secondary | ICD-10-CM

## 2020-12-12 ENCOUNTER — Other Ambulatory Visit: Payer: Self-pay | Admitting: Gerontology

## 2020-12-12 DIAGNOSIS — K7030 Alcoholic cirrhosis of liver without ascites: Secondary | ICD-10-CM

## 2020-12-13 ENCOUNTER — Other Ambulatory Visit: Payer: Self-pay | Admitting: Gerontology

## 2020-12-13 DIAGNOSIS — Z8679 Personal history of other diseases of the circulatory system: Secondary | ICD-10-CM

## 2020-12-18 ENCOUNTER — Other Ambulatory Visit: Payer: Self-pay | Admitting: Family

## 2020-12-18 ENCOUNTER — Other Ambulatory Visit: Payer: Self-pay

## 2020-12-18 DIAGNOSIS — Z8679 Personal history of other diseases of the circulatory system: Secondary | ICD-10-CM

## 2020-12-18 MED ORDER — FUROSEMIDE 40 MG PO TABS: 40.0000 mg | ORAL_TABLET | Freq: Every day | ORAL | 3 refills | Status: DC

## 2020-12-18 NOTE — Progress Notes (Signed)
Furosemide RX sent to [facility]

## 2021-01-01 ENCOUNTER — Other Ambulatory Visit: Payer: Self-pay

## 2021-01-01 ENCOUNTER — Other Ambulatory Visit
Admission: RE | Admit: 2021-01-01 | Discharge: 2021-01-01 | Disposition: A | Discharge status: Home / Self Care | Payer: HRSA Program | Source: Ambulatory Visit | Attending: Gastroenterology | Admitting: Gastroenterology

## 2021-01-01 DIAGNOSIS — Z01812 Encounter for preprocedural laboratory examination: Secondary | ICD-10-CM | POA: Insufficient documentation

## 2021-01-01 DIAGNOSIS — Z20822 Contact with and (suspected) exposure to covid-19: Secondary | ICD-10-CM | POA: Insufficient documentation

## 2021-01-02 LAB — SARS CORONAVIRUS 2 (TAT 6-24 HRS): SARS Coronavirus 2: NEGATIVE

## 2021-01-03 ENCOUNTER — Other Ambulatory Visit: Payer: Self-pay

## 2021-01-03 ENCOUNTER — Ambulatory Visit: Payer: Medicaid Other | Admitting: Certified Registered Nurse Anesthetist

## 2021-01-03 ENCOUNTER — Ambulatory Visit
Admission: RE | Admit: 2021-01-03 | Discharge: 2021-01-03 | Disposition: A | Discharge status: Home / Self Care | Payer: Medicaid Other | Attending: Gastroenterology | Admitting: Gastroenterology

## 2021-01-03 ENCOUNTER — Telehealth: Payer: Self-pay

## 2021-01-03 ENCOUNTER — Encounter: Payer: Self-pay | Admitting: Gastroenterology

## 2021-01-03 ENCOUNTER — Encounter
Admission: RE | Disposition: A | Discharge status: Home / Self Care | Payer: Self-pay | Source: Home / Self Care | Attending: Gastroenterology

## 2021-01-03 DIAGNOSIS — E1136 Type 2 diabetes mellitus with diabetic cataract: Secondary | ICD-10-CM | POA: Diagnosis not present

## 2021-01-03 DIAGNOSIS — Z7901 Long term (current) use of anticoagulants: Secondary | ICD-10-CM | POA: Diagnosis not present

## 2021-01-03 DIAGNOSIS — I110 Hypertensive heart disease with heart failure: Secondary | ICD-10-CM | POA: Insufficient documentation

## 2021-01-03 DIAGNOSIS — I8500 Esophageal varices without bleeding: Secondary | ICD-10-CM

## 2021-01-03 DIAGNOSIS — K648 Other hemorrhoids: Secondary | ICD-10-CM | POA: Insufficient documentation

## 2021-01-03 DIAGNOSIS — I509 Heart failure, unspecified: Secondary | ICD-10-CM | POA: Diagnosis not present

## 2021-01-03 DIAGNOSIS — Z5309 Procedure and treatment not carried out because of other contraindication: Secondary | ICD-10-CM | POA: Diagnosis not present

## 2021-01-03 DIAGNOSIS — I8510 Secondary esophageal varices without bleeding: Secondary | ICD-10-CM | POA: Insufficient documentation

## 2021-01-03 DIAGNOSIS — K4090 Unilateral inguinal hernia, without obstruction or gangrene, not specified as recurrent: Secondary | ICD-10-CM

## 2021-01-03 DIAGNOSIS — K766 Portal hypertension: Secondary | ICD-10-CM | POA: Diagnosis not present

## 2021-01-03 DIAGNOSIS — Z1211 Encounter for screening for malignant neoplasm of colon: Secondary | ICD-10-CM | POA: Diagnosis present

## 2021-01-03 DIAGNOSIS — Z79899 Other long term (current) drug therapy: Secondary | ICD-10-CM | POA: Diagnosis not present

## 2021-01-03 DIAGNOSIS — K7469 Other cirrhosis of liver: Secondary | ICD-10-CM | POA: Insufficient documentation

## 2021-01-03 DIAGNOSIS — D124 Benign neoplasm of descending colon: Secondary | ICD-10-CM | POA: Diagnosis not present

## 2021-01-03 DIAGNOSIS — Z7984 Long term (current) use of oral hypoglycemic drugs: Secondary | ICD-10-CM | POA: Diagnosis not present

## 2021-01-03 DIAGNOSIS — Z87891 Personal history of nicotine dependence: Secondary | ICD-10-CM | POA: Insufficient documentation

## 2021-01-03 DIAGNOSIS — K7031 Alcoholic cirrhosis of liver with ascites: Secondary | ICD-10-CM

## 2021-01-03 DIAGNOSIS — K644 Residual hemorrhoidal skin tags: Secondary | ICD-10-CM | POA: Diagnosis not present

## 2021-01-03 DIAGNOSIS — K2950 Unspecified chronic gastritis without bleeding: Secondary | ICD-10-CM | POA: Insufficient documentation

## 2021-01-03 DIAGNOSIS — K3189 Other diseases of stomach and duodenum: Secondary | ICD-10-CM | POA: Diagnosis not present

## 2021-01-03 DIAGNOSIS — K317 Polyp of stomach and duodenum: Secondary | ICD-10-CM | POA: Diagnosis not present

## 2021-01-03 DIAGNOSIS — K635 Polyp of colon: Secondary | ICD-10-CM

## 2021-01-03 DIAGNOSIS — Z86711 Personal history of pulmonary embolism: Secondary | ICD-10-CM | POA: Diagnosis not present

## 2021-01-03 HISTORY — PX: ESOPHAGOGASTRODUODENOSCOPY (EGD) WITH PROPOFOL: SHX5813

## 2021-01-03 HISTORY — PX: COLONOSCOPY WITH PROPOFOL: SHX5780

## 2021-01-03 LAB — GLUCOSE, CAPILLARY: Glucose-Capillary: 121 mg/dL — ABNORMAL HIGH (ref 70–99)

## 2021-01-03 SURGERY — COLONOSCOPY WITH PROPOFOL
Anesthesia: General

## 2021-01-03 MED ORDER — LIDOCAINE HCL (CARDIAC) PF 100 MG/5ML IV SOSY
PREFILLED_SYRINGE | INTRAVENOUS | Status: DC | PRN
  Administered 2021-01-03: 100 mg via INTRAVENOUS

## 2021-01-03 MED ORDER — PROPOFOL 500 MG/50ML IV EMUL
INTRAVENOUS | Status: DC | PRN
  Administered 2021-01-03: 150 ug/kg/min via INTRAVENOUS

## 2021-01-03 MED ORDER — PROPOFOL 10 MG/ML IV BOLUS
INTRAVENOUS | Status: DC | PRN
  Administered 2021-01-03: 20 mg via INTRAVENOUS
  Administered 2021-01-03: 50 mg via INTRAVENOUS
  Administered 2021-01-03: 20 mg via INTRAVENOUS

## 2021-01-03 MED ORDER — EPHEDRINE SULFATE 50 MG/ML IJ SOLN
INTRAMUSCULAR | Status: DC | PRN
  Administered 2021-01-03 (×5): 5 mg via INTRAVENOUS

## 2021-01-03 MED ORDER — FENTANYL CITRATE (PF) 100 MCG/2ML IJ SOLN
INTRAMUSCULAR | Status: DC | PRN
  Administered 2021-01-03 (×4): 25 ug via INTRAVENOUS

## 2021-01-03 MED ORDER — PHENYLEPHRINE HCL (PRESSORS) 10 MG/ML IV SOLN
INTRAVENOUS | Status: DC | PRN
  Administered 2021-01-03 (×12): 100 ug via INTRAVENOUS

## 2021-01-03 MED ORDER — SODIUM CHLORIDE 0.9 % IV SOLN: INTRAVENOUS | Status: DC

## 2021-01-03 MED ORDER — FENTANYL CITRATE (PF) 100 MCG/2ML IJ SOLN
INTRAMUSCULAR | Status: AC
  Filled 2021-01-03: qty 2

## 2021-01-03 NOTE — Anesthesia Preprocedure Evaluation (Signed)
Anesthesia Evaluation  Patient identified by MRN, date of birth, ID band Patient awake    Reviewed: Allergy & Precautions, H&P , NPO status , Patient's Chart, lab work & pertinent test results  History of Anesthesia Complications Negative for: history of anesthetic complications  Airway Mallampati: III  TM Distance: >3 FB Neck ROM: limited    Dental  (+) Chipped, Poor Dentition, Missing   Pulmonary neg shortness of breath, former smoker,    Pulmonary exam normal        Cardiovascular Exercise Tolerance: Good (-) angina+CHF  Normal cardiovascular exam     Neuro/Psych PSYCHIATRIC DISORDERS negative neurological ROS  negative psych ROS   GI/Hepatic negative GI ROS, Neg liver ROS, neg GERD  ,  Endo/Other  diabetes, Type 2  Renal/GU negative Renal ROS  negative genitourinary   Musculoskeletal   Abdominal   Peds  Hematology negative hematology ROS (+)   Anesthesia Other Findings Past Medical History: No date: Cataract No date: CHF (congestive heart failure) (HCC) No date: Cirrhosis (HCC) No date: Diabetes mellitus without complication (HCC) No date: Pulmonary embolus (HCC)  Past Surgical History: No date: CATARACT EXTRACTION  BMI    Body Mass Index: 29.13 kg/m      Reproductive/Obstetrics negative OB ROS                             Anesthesia Physical Anesthesia Plan  ASA: III  Anesthesia Plan: General   Post-op Pain Management:    Induction: Intravenous  PONV Risk Score and Plan: Propofol infusion and TIVA  Airway Management Planned: Natural Airway and Nasal Cannula  Additional Equipment:   Intra-op Plan:   Post-operative Plan:   Informed Consent: I have reviewed the patients History and Physical, chart, labs and discussed the procedure including the risks, benefits and alternatives for the proposed anesthesia with the patient or authorized representative who has  indicated his/her understanding and acceptance.     Dental Advisory Given  Plan Discussed with: Anesthesiologist, CRNA and Surgeon  Anesthesia Plan Comments: (History and consent via interpreter   Patient consented for risks of anesthesia including but not limited to:  - adverse reactions to medications - risk of airway placement if required - damage to eyes, teeth, lips or other oral mucosa - nerve damage due to positioning  - sore throat or hoarseness - Damage to heart, brain, nerves, lungs, other parts of body or loss of life  Patient voiced understanding.)        Anesthesia Quick Evaluation

## 2021-01-03 NOTE — Anesthesia Procedure Notes (Signed)
Procedure Name: MAC Date/Time: 01/03/2021 9:21 AM Performed by: Lily Peer, Armando Bukhari, CRNA Pre-anesthesia Checklist: Patient identified, Emergency Drugs available, Suction available, Patient being monitored and Timeout performed Patient Re-evaluated:Patient Re-evaluated prior to induction Oxygen Delivery Method: Nasal cannula Induction Type: IV induction

## 2021-01-03 NOTE — Op Note (Signed)
[facility] Gastroenterology Patient Name: Collin Byrd Procedure Date: 01/03/2021 9:14 AM MRN: [id number] Account #: [id number] Date of Birth: [birth year] Admit Type: Outpatient Age: 60 Room: [facility] ENDO ROOM 4 Gender: Male Note Status: Finalized Procedure:             Upper GI endoscopy Indications:           Cirrhosis rule out esophageal varices Providers:             Lin Landsman MD, MD Referring MD:          Lonny Prude. [place] (Referring MD) Medicines:             General Anesthesia Complications:         No immediate complications. Estimated blood loss: None. Procedure:             Pre-Anesthesia Assessment:                        - Prior to the procedure, a History and Physical was                         performed, and patient medications and allergies were                         reviewed. The patient is competent. The risks and                         benefits of the procedure and the sedation options and                         risks were discussed with the patient. All questions                         were answered and informed consent was obtained.                         Patient identification and proposed procedure were                         verified by the physician, the nurse, the                         anesthesiologist, the anesthetist and the technician                         in the pre-procedure area in the procedure room in the                         endoscopy suite. Mental Status Examination: alert and                         oriented. Airway Examination: normal oropharyngeal                         airway and neck mobility. Respiratory Examination:                         clear to auscultation. CV Examination: normal.  Prophylactic Antibiotics: The patient does not require                         prophylactic antibiotics. Prior Anticoagulants: The                         patient has taken no previous  anticoagulant or                         antiplatelet agents. ASA Grade Assessment: III - A                         patient with severe systemic disease. After reviewing                         the risks and benefits, the patient was deemed in                         satisfactory condition to undergo the procedure. The                         anesthesia plan was to use general anesthesia.                         Immediately prior to administration of medications,                         the patient was re-assessed for adequacy to receive                         sedatives. The heart rate, respiratory rate, oxygen                         saturations, blood pressure, adequacy of pulmonary                         ventilation, and response to care were monitored                         throughout the procedure. The physical status of the                         patient was re-assessed after the procedure.                        After obtaining informed consent, the endoscope was                         passed under direct vision. Throughout the procedure,                         the patient's blood pressure, pulse, and oxygen                         saturations were monitored continuously. The Endoscope                         was introduced through the mouth, and advanced to the  second part of duodenum. The upper GI endoscopy was                         accomplished without difficulty. The patient tolerated                         the procedure well. Findings:      The duodenal bulb and second portion of the duodenum were normal.      Severe, diffuse portal hypertensive gastropathy was found in the entire       examined stomach. Biopsies were taken with a cold forceps for histology.      A single 6 mm sessile polyp with no bleeding and no stigmata of recent       bleeding was found in the gastric antrum. Biopsies were taken with a       cold forceps for histology.  Estimated blood loss was minimal. To prevent       bleeding after the biopsy, one hemostatic clip was successfully placed       (MR conditional). There was no bleeding at the end of the procedure. No       evidence of gastric varices      Single column of small (< 5 mm) varices were found in the lower third of       the esophagus.      Esophagogastric landmarks were identified: the gastroesophageal junction       was found at 40 cm from the incisors.      The proximal esophagus, mid esophagus and gastroesophageal junction were       normal. Impression:            - Normal duodenal bulb and second portion of the                         duodenum.                        - Portal hypertensive gastropathy. Biopsied.                        - A single gastric polyp. Biopsied. Clip (MR                         conditional) was placed.                        - Small (< 5 mm) esophageal varices.                        - Esophagogastric landmarks identified.                        - Normal proximal esophagus, mid esophagus and                         gastroesophageal junction. Recommendation:        - Discharge patient to home (with escort).                        - Low sodium diet today.                        -  Proceed with colonoscopy as scheduled                        See colonoscopy report                        - Await pathology results.                        - Return to my office as previously scheduled.                        - Repeat upper endoscopy in 3 years for surveillance                         or sooner based on his cirrhosis. Procedure Code(s):     --- Professional ---                        [phone], Esophagogastroduodenoscopy, flexible,                         transoral; with biopsy, single or multiple Diagnosis Code(s):     --- Professional ---                        K74.60, Unspecified cirrhosis of liver                        I85.10, Secondary esophageal varices without  bleeding                        K76.6, Portal hypertension                        K31.89, Other diseases of stomach and duodenum                        K31.7, Polyp of stomach and duodenum CPT copyright 2019 American Medical Association. All rights reserved. The codes documented in this report are preliminary and upon coder review may  be revised to meet current compliance requirements. Dr. Libby Maw Toney Reil MD, MD 01/03/2021 9:34:31 AM This report has been signed electronically. Number of Addenda: 0 Note Initiated On: 01/03/2021 9:14 AM Estimated Blood Loss:  Estimated blood loss: none.      [facility]

## 2021-01-03 NOTE — Op Note (Signed)
[facility] Gastroenterology Patient Name: Collin Byrd Procedure Date: 01/03/2021 9:13 AM MRN: [id number] Account #: [id number] Date of Birth: Apr 24, 1961 Admit Type: Outpatient Age: 60 Room: [facility] ENDO ROOM 4 Gender: Male Note Status: Finalized Procedure:             Colonoscopy Indications:           Screening for colorectal malignant neoplasm, This is                         the patient's first colonoscopy Providers:             Lin Landsman MD, MD Referring MD:          Lonny Prude. Iloabachie (Referring MD) Medicines:             General Anesthesia Complications:         No immediate complications. Estimated blood loss: None. Procedure:             Pre-Anesthesia Assessment:                        - Prior to the procedure, a History and Physical was                         performed, and patient medications and allergies were                         reviewed. The patient is competent. The risks and                         benefits of the procedure and the sedation options and                         risks were discussed with the patient. All questions                         were answered and informed consent was obtained.                         Patient identification and proposed procedure were                         verified by the physician, the nurse, the                         anesthesiologist, the anesthetist and the technician                         in the pre-procedure area in the procedure room in the                         endoscopy suite. Mental Status Examination: alert and                         oriented. Airway Examination: normal oropharyngeal                         airway and neck mobility. Respiratory Examination:  clear to auscultation. CV Examination: normal.                         Prophylactic Antibiotics: The patient does not require                         prophylactic antibiotics. Prior Anticoagulants:  The                         patient has taken Xarelto (rivaroxaban), last dose was                         4 days prior to procedure. ASA Grade Assessment: III -                         A patient with severe systemic disease. After                         reviewing the risks and benefits, the patient was                         deemed in satisfactory condition to undergo the                         procedure. The anesthesia plan was to use general                         anesthesia. Immediately prior to administration of                         medications, the patient was re-assessed for adequacy                         to receive sedatives. The heart rate, respiratory                         rate, oxygen saturations, blood pressure, adequacy of                         pulmonary ventilation, and response to care were                         monitored throughout the procedure. The physical                         status of the patient was re-assessed after the                         procedure.                        After obtaining informed consent, the colonoscope was                         passed under direct vision. Throughout the procedure,                         the patient's blood pressure, pulse, and oxygen  saturations were monitored continuously. The                         Colonoscope was introduced through the anus with the                         intention of advancing to the cecum. The scope was                         advanced to the transverse colon before the procedure                         was aborted. Medications were given. The colonoscopy                         was extremely difficult due to large left inguinal                         hernia. The procedure was unsuccessful despite                         changing the patient to a supine position, changing                         the patient to a prone position and withdrawing the                          scope and replacing with the pediatric colonoscope.                         Scope was withdrawn. The patient tolerated the                         procedure well. The quality of the bowel preparation                         was evaluated using the BBPS [facility] Bowel Preparation                         Scale) with scores of: Right Colon = 3, Transverse                         Colon = 3 and Left Colon = 3 (entire mucosa seen well                         with no residual staining, small fragments of stool or                         opaque liquid). The total BBPS score equals 9. Findings:      Procedure aborted due to large left inguinal hernia, advanced to       transverse colon only      The perianal and digital rectal examinations were normal. Pertinent       negatives include normal sphincter tone and no palpable rectal lesions.      Two sessile polyps were found in the descending colon and transverse       colon. The  polyps were 3 to 5 mm in size. These polyps were removed with       a cold snare. Resection and retrieval were complete. Estimated blood       loss: none.      Non-bleeding external and internal hemorrhoids were found during       retroflexion. The hemorrhoids were large. Impression:            - Two 3 to 5 mm polyps in the descending colon and in                         the transverse colon, removed with a cold snare.                         Resected and retrieved.                        - Non-bleeding external and internal hemorrhoids. Recommendation:        - Discharge patient to home (with escort).                        - Resume previous diet today.                        - Continue present medications.                        - Await pathology results.                        - Refer to a general surgeon at appointment to be                         scheduled.                        - Repeat colonoscopy in 6 months because the                          examination was incomplete after repair of the                         inguinal hernia.                        - Return to my office as previously scheduled. Procedure Code(s):     --- Professional ---                        [phone], 52, Colonoscopy, flexible; with removal of                         tumor(s), polyp(s), or other lesion(s) by snare                         technique Diagnosis Code(s):     --- Professional ---                        Z12.11, Encounter for screening for malignant neoplasm  of colon                        K63.5, Polyp of colon                        K64.8, Other hemorrhoids CPT copyright 2019 American Medical Association. All rights reserved. The codes documented in this report are preliminary and upon coder review may  be revised to meet current compliance requirements. Dr. Ulyess Mort Lin Landsman MD, MD 01/03/2021 10:32:35 AM This report has been signed electronically. Number of Addenda: 0 Note Initiated On: 01/03/2021 9:13 AM Total Procedure Duration: 0 hours 49 minutes 58 seconds  Estimated Blood Loss:  Estimated blood loss: none.      [facility]

## 2021-01-03 NOTE — Anesthesia Postprocedure Evaluation (Signed)
Anesthesia Post Note  Patient: Barack Nicodemus  Procedure(s) Performed: COLONOSCOPY WITH PROPOFOL (N/A ) ESOPHAGOGASTRODUODENOSCOPY (EGD) WITH PROPOFOL (N/A )  Patient location during evaluation: Endoscopy Anesthesia Type: General Level of consciousness: awake and alert Pain management: pain level controlled Vital Signs Assessment: post-procedure vital signs reviewed and stable Respiratory status: spontaneous breathing, nonlabored ventilation, respiratory function stable and patient connected to nasal cannula oxygen Cardiovascular status: blood pressure returned to baseline and stable Postop Assessment: no apparent nausea or vomiting Anesthetic complications: no   No complications documented.   Last Vitals:  Vitals:   01/03/21 1041 01/03/21 1051  BP: 119/80 119/70  Pulse: 94 62  Resp: (!) 23 13  Temp:    SpO2: 100%     Last Pain:  Vitals:   01/03/21 1051  TempSrc:   PainSc: 0-No pain                 Cleda Mccreedy Maurissa Ambrose

## 2021-01-03 NOTE — Telephone Encounter (Signed)
-----   Message from Toney Reil, MD sent at 01/03/2021 10:45 AM EST ----- Regarding: Referral to gen surg Please refer him to gen surg for large left inguinal hernia repair, unable to complete colonoscopy due to large hernia  RV

## 2021-01-03 NOTE — Transfer of Care (Signed)
Immediate Anesthesia Transfer of Care Note  Patient: Collin Byrd  Procedure(s) Performed: COLONOSCOPY WITH PROPOFOL (N/A ) ESOPHAGOGASTRODUODENOSCOPY (EGD) WITH PROPOFOL (N/A )  Patient Location: PACU and Endoscopy Unit  Anesthesia Type:General  Level of Consciousness: awake, alert  and oriented  Airway & Oxygen Therapy: Patient Spontanous Breathing  Post-op Assessment: Report given to RN and Post -op Vital signs reviewed and stable  Post vital signs: Reviewed and stable  Last Vitals:  Vitals Value Taken Time  BP 103/70 01/03/21 1031  Temp    Pulse 87 01/03/21 1031  Resp 14 01/03/21 1031  SpO2 100 % 01/03/21 1031  Vitals shown include unvalidated device data.  Last Pain:  Vitals:   01/03/21 0847  TempSrc: Temporal  PainSc: 0-No pain         Complications: No complications documented.

## 2021-01-03 NOTE — Telephone Encounter (Signed)
Placed referral per Dr. Marius Ditch request

## 2021-01-03 NOTE — H&P (Signed)
Collin Darby, MD [address]  [place]  [place], [place] [address]  Main: [phone]  Fax: [phone] Pager: [phone]  Primary Care Physician:  Langston Reusing, NP Primary Gastroenterologist:  Dr. Cephas Byrd  Pre-Procedure History & Physical: HPI:  Collin Byrd is a 60 y.o. male is here for an endoscopy and colonoscopy.   Past Medical History:  Diagnosis Date  . Cataract   . CHF (congestive heart failure) ([place])   . Cirrhosis ([place])   . Diabetes mellitus without complication ([place])   . Pulmonary embolus [facility])     Past Surgical History:  Procedure Laterality Date  . CATARACT EXTRACTION      Prior to Admission medications   Medication Sig Start Date End Date Taking? Authorizing Provider  FEROSUL 325 (65 Fe) MG tablet TAKE ONE TABLET BY MOUTH EVERY DAY WITHBREAKFAST. 12/13/20  Yes Iloabachie, Chioma E, NP  folic acid (FOLVITE) 1 MG tablet Take 1 tablet (1 mg total) by mouth daily. 09/26/20  Yes Iloabachie, Chioma E, NP  furosemide (LASIX) 40 MG tablet Take 1 tablet (40 mg total) by mouth daily. 12/18/20  Yes Hackney, Otila Kluver A, FNP  glimepiride (AMARYL) 1 MG tablet Take 1 tablet (1 mg total) by mouth daily with breakfast. 09/26/20  Yes Iloabachie, Chioma E, NP  Omega-3 Fatty Acids (FISH OIL) 1000 MG CAPS Take 2 capsules (2,000 mg total) by mouth in the morning and at bedtime. 11/28/20  Yes Tukov-Yual, Magdalene S, NP  pantoprazole (PROTONIX) 40 MG tablet Take 1 tablet (40 mg total) by mouth daily. 09/26/20  Yes Iloabachie, Chioma E, NP  spironolactone (ALDACTONE) 100 MG tablet TAKE ONE TABLET BY MOUTH 2 TIMES A DAY. 12/05/20  Yes Tukov-Yual, Magdalene S, NP  spironolactone (ALDACTONE) 50 MG tablet TAKE TWO TABLETS (153m) BY MOUTH 2 TIMES A DAY 12/05/20  Yes Tukov-Yual, Magdalene S, NP  thiamine (VITAMIN B-1) 100 MG tablet Take 100 mg by mouth daily.   Yes [provider]  artificial tears (LACRILUBE) OINT ophthalmic ointment Place into the right eye 3  (three) times daily. 10/30/20   Iloabachie, Chioma E, NP  blood glucose meter kit and supplies KIT Dispense based on patient and insurance preference. Use up to four times daily as directed. (FOR ICD-9 250.00, 250.01). 07/12/20   Iloabachie, Chioma E, NP  XARELTO 20 MG TABS tablet TAKE ONE TABLET BY MOUTH EVERY DAY WITH SUPPER 11/12/20   BCammie Sickle MD    Allergies as of 11/29/2020  . (No Known Allergies)    History reviewed. No pertinent family history.  Social History   Socioeconomic History  . Marital status: Married    Spouse name: Not on file  . Number of children: Not on file  . Years of education: Not on file  . Highest education level: Not on file  Occupational History  . Not on file  Tobacco Use  . Smoking status: Former Smoker    Packs/day: 0.25    Types: Cigarettes  . Smokeless tobacco: Never Used  Vaping Use  . Vaping Use: Never used  Substance and Sexual Activity  . Alcohol use: Not Currently  . Drug use: Never  . Sexual activity: Not Currently  Other Topics Concern  . Not on file  Social History Narrative  . Not on file   Social Determinants of Health   Financial Resource Strain: Not on file  Food Insecurity: Not on file  Transportation Needs: Not on file  Physical Activity: Not on file  Stress: Not on file  Social Connections: Not on file  Intimate Partner Violence: Not on file    Review of Systems: See HPI, otherwise negative ROS  Physical Exam: BP 113/82   Pulse 79   Temp 98.3 F (36.8 C) (Temporal)   Resp 16   Ht _0  (1.702 m)   Wt 84.4 kg   SpO2 100%   BMI 29.13 kg/m  General:   Alert,  pleasant and cooperative in NAD Head:  Normocephalic and atraumatic. Neck:  Supple; no masses or thyromegaly. Lungs:  Clear throughout to auscultation.    Heart:  Regular rate and rhythm. Abdomen:  Soft, nontender and nondistended. Normal bowel sounds, without guarding, and without rebound.   Neurologic:  Alert and  oriented x4;  grossly  normal neurologically.  Impression/Plan: Collin Byrd is here for an endoscopy and colonoscopy to be performed for h/o cirrhosis, colon cancer screening  Risks, benefits, limitations, and alternatives regarding  endoscopy and colonoscopy have been reviewed with the patient.  Questions have been answered.  All parties agreeable.   Sherri Sear, MD  01/03/2021, 9:00 AM

## 2021-01-07 ENCOUNTER — Other Ambulatory Visit: Payer: Self-pay | Admitting: Gerontology

## 2021-01-07 ENCOUNTER — Telehealth: Payer: Self-pay

## 2021-01-07 DIAGNOSIS — K2971 Gastritis, unspecified, with bleeding: Secondary | ICD-10-CM

## 2021-01-07 DIAGNOSIS — K219 Gastro-esophageal reflux disease without esophagitis: Secondary | ICD-10-CM

## 2021-01-07 DIAGNOSIS — K2991 Gastroduodenitis, unspecified, with bleeding: Secondary | ICD-10-CM

## 2021-01-07 LAB — SURGICAL PATHOLOGY

## 2021-01-07 NOTE — Telephone Encounter (Signed)
Order lab work. Can you please call patient

## 2021-01-07 NOTE — Telephone Encounter (Signed)
Called patient to let him know that Dr. Marius Ditch wanted him to know that he had inflammation in his stomach and she wanted him to get checked for H Pylori. I told him to come to our clinic or [facility] in [place] off [address]. Patient understood and had no further questions.

## 2021-01-07 NOTE — Telephone Encounter (Signed)
-----   Message from Lin Landsman, MD sent at 01/07/2021 12:38 PM EST ----- The gastric biopsy results showed moderately active inflammation in the stomach, therefore recommend to check H. pylori IgG and will treat if positive  RV

## 2021-01-08 ENCOUNTER — Telehealth: Payer: Self-pay | Admitting: Gastroenterology

## 2021-01-08 NOTE — Telephone Encounter (Signed)
Can you please call patient.

## 2021-01-08 NOTE — Telephone Encounter (Signed)
Patient came in and has questions regarding his lab results, difficult to understand exactly what he needs.

## 2021-01-08 NOTE — Telephone Encounter (Signed)
Called patient back and he didn't answer. I left a voicemail to call us back.

## 2021-01-09 ENCOUNTER — Other Ambulatory Visit: Payer: Self-pay | Admitting: Gerontology

## 2021-01-13 NOTE — Telephone Encounter (Signed)
Called patient again and he did not answer his phone. I left him a voicemail to call us back.

## 2021-01-14 ENCOUNTER — Ambulatory Visit: Payer: Self-pay | Admitting: Surgery

## 2021-01-21 ENCOUNTER — Ambulatory Visit (INDEPENDENT_AMBULATORY_CARE_PROVIDER_SITE_OTHER): Payer: Self-pay | Admitting: Surgery

## 2021-01-21 ENCOUNTER — Other Ambulatory Visit: Payer: Self-pay

## 2021-01-21 ENCOUNTER — Other Ambulatory Visit
Admission: RE | Admit: 2021-01-21 | Discharge: 2021-01-21 | Disposition: A | Discharge status: Home / Self Care | Payer: Self-pay | Source: Ambulatory Visit | Attending: Surgery | Admitting: Surgery

## 2021-01-21 ENCOUNTER — Encounter: Payer: Self-pay | Admitting: Surgery

## 2021-01-21 VITALS — BP 117/78 | HR 76 | Temp 98.5°F | Ht 67.0 in | Wt 182.0 lb

## 2021-01-21 DIAGNOSIS — K7030 Alcoholic cirrhosis of liver without ascites: Secondary | ICD-10-CM | POA: Insufficient documentation

## 2021-01-21 DIAGNOSIS — K4030 Unilateral inguinal hernia, with obstruction, without gangrene, not specified as recurrent: Secondary | ICD-10-CM

## 2021-01-21 LAB — CBC WITH DIFFERENTIAL/PLATELET
Abs Immature Granulocytes: 0.06 10*3/uL (ref 0.00–0.07)
Basophils Absolute: 0.1 10*3/uL (ref 0.0–0.1)
Basophils Relative: 1 %
Eosinophils Absolute: 0 10*3/uL (ref 0.0–0.5)
Eosinophils Relative: 0 %
HCT: 40.5 % (ref 39.0–52.0)
Hemoglobin: 13.8 g/dL (ref 13.0–17.0)
Immature Granulocytes: 1 %
Lymphocytes Relative: 9 %
Lymphs Abs: 0.9 10*3/uL (ref 0.7–4.0)
MCH: 31.9 pg (ref 26.0–34.0)
MCHC: 34.1 g/dL (ref 30.0–36.0)
MCV: 93.8 fL (ref 80.0–100.0)
Monocytes Absolute: 0.8 10*3/uL (ref 0.1–1.0)
Monocytes Relative: 8 %
Neutro Abs: 8.5 10*3/uL — ABNORMAL HIGH (ref 1.7–7.7)
Neutrophils Relative %: 81 %
Platelets: 237 10*3/uL (ref 150–400)
RBC: 4.32 MIL/uL (ref 4.22–5.81)
RDW: 12.4 % (ref 11.5–15.5)
WBC: 10.4 10*3/uL (ref 4.0–10.5)
nRBC: 0 % (ref 0.0–0.2)

## 2021-01-21 LAB — COMPREHENSIVE METABOLIC PANEL
ALT: 18 U/L (ref 0–44)
AST: 19 U/L (ref 15–41)
Albumin: 4.5 g/dL (ref 3.5–5.0)
Alkaline Phosphatase: 62 U/L (ref 38–126)
Anion gap: 12 (ref 5–15)
BUN: 16 mg/dL (ref 6–20)
CO2: 26 mmol/L (ref 22–32)
Calcium: 9.7 mg/dL (ref 8.9–10.3)
Chloride: 100 mmol/L (ref 98–111)
Creatinine, Ser: 0.8 mg/dL (ref 0.61–1.24)
GFR, Estimated: >60 mL/min (ref >60)
Glucose, Bld: 110 mg/dL — ABNORMAL HIGH (ref 70–99)
Potassium: 4.3 mmol/L (ref 3.5–5.1)
Sodium: 138 mmol/L (ref 135–145)
Total Bilirubin: 1.4 mg/dL — ABNORMAL HIGH (ref 0.3–1.2)
Total Protein: 8.2 g/dL — ABNORMAL HIGH (ref 6.5–8.1)

## 2021-01-21 LAB — PROTIME-INR
INR: 2.9 — ABNORMAL HIGH (ref 0.8–1.2)
Prothrombin Time: 29.1 seconds — ABNORMAL HIGH (ref 11.4–15.2)

## 2021-01-21 NOTE — Progress Notes (Signed)
Patient ID: Collin Byrd, male   DOB: 1961/05/15, 60 y.o.   MRN: [id number]  HPI Collin Byrd is a 60 y.o. male seen at the request of Dr. Marius Ditch for left inguinal hernia.  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  Last drink was about 8 months ago.  Reports some intermittent fullness and discomfort mild pain left inguinal area.  Pain is intermittent dull without any specific aggravating alleviating factors.  He has had a hernia for more than 25 years or so.  He had an admission about 6 months ago due to pneumonia and shortness of breath.  At that time was found to have cirrhosis that was decompensated.  Ever since he has been on his spironolactone, he has stopped drinking.  There has been significant improvement on his overall health.  He denies any dyspnea on exertion.  His weight now is around 180 pounds.  He also had a history of PE and is currently on anticoagulation Xarelto. He recently saw Dr. Marius Ditch and a colonoscopy was attempted however the scope was found within the scrotum and the procedure had to be stopped.  He did have some polyps in the descending colon that were benign.  Please note that I have personally reviewed the images of the colonoscopy.  He also had a CT scan 6 months ago that have personally reviewed at that time there was evidence of cirrhosis and ascites with a giant left inguinal hernia with multiple loops of bowel within the sac. He denies any prior abdominal operations He is on both spironolactone and Lasix CBC from 2 months ago shows normal platelet count and a hemoglobin of 12.4 CMP from over 6 months ago shows creatinine, normal LFTs  HPI  Past Medical History:  Diagnosis Date  . Cataract   . CHF (congestive heart failure) ([place])   . Cirrhosis ([place])   . Diabetes mellitus without complication ([place])   . Pulmonary embolus [facility])     Past Surgical History:  Procedure Laterality Date  . CATARACT EXTRACTION    . COLONOSCOPY WITH PROPOFOL N/A  01/03/2021   Procedure: COLONOSCOPY WITH PROPOFOL;  Surgeon: Lin Landsman, MD;  Location: [facility] ENDOSCOPY;  Service: Gastroenterology;  Laterality: N/A;  . ESOPHAGOGASTRODUODENOSCOPY (EGD) WITH PROPOFOL N/A 01/03/2021   Procedure: ESOPHAGOGASTRODUODENOSCOPY (EGD) WITH PROPOFOL;  Surgeon: Lin Landsman, MD;  Location: [facility] ENDOSCOPY;  Service: Gastroenterology;  Laterality: N/A;    History reviewed. No pertinent family history.  Social History Social History   Tobacco Use  . Smoking status: Former Smoker    Packs/day: 0.25    Types: Cigarettes  . Smokeless tobacco: Never Used  Vaping Use  . Vaping Use: Never used  Substance Use Topics  . Alcohol use: Not Currently  . Drug use: Never    No Known Allergies  Current Outpatient Medications  Medication Sig Dispense Refill  . artificial tears (LACRILUBE) OINT ophthalmic ointment Place into the right eye 3 (three) times daily. 1 g 0  . blood glucose meter kit and supplies KIT Dispense based on patient and insurance preference. Use up to four times daily as directed. (FOR ICD-9 250.00, 250.01). 1 each 0  . FEROSUL 325 (65 Fe) MG tablet TAKE ONE TABLET BY MOUTH EVERY DAY WITHBREAKFAST. 30 tablet 0  . folic acid (FOLVITE) 1 MG tablet Take 1 tablet (1 mg total) by mouth daily. 30 tablet 2  . furosemide (LASIX) 40 MG tablet Take 1 tablet (40 mg total) by mouth daily. 90 tablet  3  . glimepiride (AMARYL) 1 MG tablet Take 1 tablet (1 mg total) by mouth daily with breakfast. 30 tablet 1  . Omega-3 Fatty Acids (FISH OIL) 1000 MG CAPS Take 2 capsules (2,000 mg total) by mouth in the morning and at bedtime. 60 capsule 2  . pantoprazole (PROTONIX) 40 MG tablet TAKE ONE TABLET BY MOUTH EVERY DAY 90 tablet 0  . spironolactone (ALDACTONE) 100 MG tablet TAKE ONE TABLET BY MOUTH 2 TIMES A DAY. 180 tablet 0  . spironolactone (ALDACTONE) 50 MG tablet TAKE TWO TABLETS (141m) BY MOUTH 2 TIMES A DAY 120 tablet 0  . thiamine (VITAMIN B-1) 100 MG tablet  TAKE ONE TABLET BY MOUTH EVERY DAY 30 tablet 2  . XARELTO 20 MG TABS tablet TAKE ONE TABLET BY MOUTH EVERY DAY WITH SUPPER 90 tablet 0   No current facility-administered medications for this visit.     Review of Systems Full ROS  was asked and was negative except for the information on the HPI  Physical Exam Blood pressure 117/78, pulse 76, temperature 98.5 F (36.9 C), temperature source Oral, height 5' 7"  (1.702 m), weight 182 lb (82.6 kg), SpO2 98 %. CONSTITUTIONAL: NAD EYES: Pupils are equal, round, Sclera are non-icteric. EARS, NOSE, MOUTH AND THROAT:HE is wearing a mask. Hearing is intact to voice. LYMPH NODES:  Lymph nodes in the neck are normal. RESPIRATORY:  Lungs are clear. There is normal respiratory effort, with equal breath sounds bilaterally, and without pathologic use of accessory muscles. CARDIOVASCULAR: Heart is regular without murmurs, gallops, or rubs. GI: The abdomen is  soft, nontender, and nondistended. There are no palpable masses. There is no hepatosplenomegaly. There are normal bowel sounds in all quadrants. There is reducible umbilical hernia and chronically incarcerated Giant Left inguinal hernia, no peritonitis. Bowel inside sac GU: Rectal deferred.   MUSCULOSKELETAL: Normal muscle strength and tone. No cyanosis or edema.   SKIN: Turgor is good and there are no pathologic skin lesions or ulcers. NEUROLOGIC: Motor and sensation is grossly normal. Cranial nerves are grossly intact. PSYCH:  Oriented to person, place and time. Affect is normal.  Data Reviewed   I have personally reviewed the patient's imaging, laboratory findings and medical records.    Assessment/Plan 60year old male with a giant chronically incarcerated left inguinal hernia and improving cirrhosis.  I suspect that currently he is [name] but we do not have any recent laboratory values to appropriately assess his liver function.  We will first establish a current liver function test and  child score.  We will also obtain a CT scan of the abdomen and pelvis to assess for the hernia defect as well as for the degree of ascites and cirrhosis.   He does not seem to have portal hypertension which is very favorable in the situation. This is definitely a complex patient given his cirrhosis.  There is no need for urgent surgical intervention.  First order of business is to perform a comprehensive preoperative risk assessment.  Extensive counseling provided Time spent with the patient was 65 minutes, with more than 50% of the time spent in face-to-face education, counseling and care coordination.   A copy of this report was sent to the referring provider  DCaroleen Hamman MD FACS General Surgeon 01/21/2021, 1:25 PM

## 2021-01-21 NOTE — Patient Instructions (Addendum)
CT Abdomen / Pelvis scheduled 01/29/2021 . Nothing to eat or drink 4 hours prior. Please stop by the Radiology desk today to pick up the prep kit and instructions.   Please go directly to the Lab at [place] of [facility] to have blood work done. Please see your follow up appointment listed below.        CT Abdomen /Pelvis scheduled Inguinal Hernia, Adult An inguinal hernia is when fat or your intestines push through a weak spot in a muscle where your leg meets your lower belly (groin). This causes a bulge. This kind of hernia could also be:  In your scrotum, if you are male.  In folds of skin around your vagina, if you are male. There are three types of inguinal hernias:  Hernias that can be pushed back into the belly (are reducible). This type rarely causes pain.  Hernias that cannot be pushed back into the belly (are incarcerated).  Hernias that cannot be pushed back into the belly and lose their blood supply (are strangulated). This type needs emergency surgery. What are the causes? This condition is caused by having a weak spot in the muscles or tissues in your groin. This develops over time. The hernia may poke through the weak spot when you strain your lower belly muscles all of a sudden, such as when you:  Lift a heavy object.  Strain to poop (have a bowel movement). Trouble pooping (constipation) can lead to straining.  Cough. What increases the risk? This condition is more likely to develop in:  Males.  Pregnant females.  People who: ? Are overweight. ? Work in jobs that require long periods of standing or heavy lifting. ? Have had an inguinal hernia before. ? Smoke or have lung disease. These factors can lead to long-term (chronic) coughing. What are the signs or symptoms? Symptoms may depend on the size of the hernia. Often, a small hernia has no symptoms. Symptoms of a larger hernia may include:  A bulge in the groin area. This is easier to see when standing.  You might not be able to see it when you are lying down.  Pain or burning in the groin. This may get worse when you lift, strain, or cough.  A dull ache or a feeling of pressure in the groin.  An abnormal bulge in the scrotum, in males. Symptoms of a strangulated inguinal hernia may include:  A bulge in your groin that is very painful and tender to the touch.  A bulge that turns red or purple.  Fever, feeling like you may vomit (nausea), and vomiting.  Not being able to poop or to pass gas. How is this treated? Treatment depends on the size of your hernia and whether you have symptoms. If you do not have symptoms, your doctor may have you watch your hernia carefully and have you come in for follow-up visits. If your hernia is large or if you have symptoms, you may need surgery to repair the hernia. Follow these instructions at home: Lifestyle  Avoid lifting heavy objects.  Avoid standing for long amounts of time.  Do not smoke or use any products that contain nicotine or tobacco. If you need help quitting, ask your doctor.  Stay at a healthy weight. Prevent trouble pooping You may need to take these actions to prevent or treat trouble pooping:  Drink enough fluid to keep your pee (urine) pale yellow.  Take over-the-counter or prescription medicines.  Eat foods that are high  in fiber. These include beans, whole grains, and fresh fruits and vegetables.  Limit foods that are high in fat and sugar. These include fried or sweet foods. General instructions  You may try to push your hernia back in place by very gently pressing on it when you are lying down. Do not try to push the bulge back in if it will not go in easily.  Watch your hernia for any changes in shape, size, or color. Tell your doctor if you see any changes.  Take over-the-counter and prescription medicines only as told by your doctor.  Keep all follow-up visits. Contact a doctor if:  You have a fever or  chills.  You have new symptoms.  Your symptoms get worse. Get help right away if:  You have pain in your groin that gets worse all of a sudden.  You have a bulge in your groin that: ? Gets bigger all of a sudden, and it does not get smaller after that. ? Turns red or purple. ? Is painful when you touch it.  You are a male, and you have: ? Sudden pain in your scrotum. ? A sudden change in the size of your scrotum.  You cannot push the hernia back in place by very gently pressing on it when you are lying down.  You feel like you may vomit, and that feeling does not go away.  You keep vomiting.  You have a fast heartbeat.  You cannot poop or pass gas. These symptoms may be an emergency. Get help right away. Call your local emergency services (911 in the [place]  Do not wait to see if the symptoms will go away.  Do not drive yourself to the hospital. Summary  An inguinal hernia is when fat or your intestines push through a weak spot in a muscle where your leg meets your lower belly (groin). This causes a bulge.  If you do not have symptoms, you may not need treatment. If you have symptoms or a large hernia, you may need surgery.  Avoid lifting heavy objects. Also, avoid standing for long amounts of time.  Do not try to push the bulge back in if it will not go in easily. This information is not intended to replace advice given to you by your health care provider. Make sure you discuss any questions you have with your health care provider. Document Revised: 08/14/2020 Document Reviewed: 08/14/2020 Elsevier Patient Education  2021 [employer].

## 2021-01-22 ENCOUNTER — Other Ambulatory Visit: Payer: Self-pay | Admitting: *Deleted

## 2021-01-22 ENCOUNTER — Telehealth: Payer: Self-pay | Admitting: *Deleted

## 2021-01-22 ENCOUNTER — Telehealth: Payer: Self-pay

## 2021-01-22 DIAGNOSIS — R590 Localized enlarged lymph nodes: Secondary | ICD-10-CM

## 2021-01-22 DIAGNOSIS — Z7901 Long term (current) use of anticoagulants: Secondary | ICD-10-CM

## 2021-01-22 DIAGNOSIS — I2699 Other pulmonary embolism without acute cor pulmonale: Secondary | ICD-10-CM

## 2021-01-22 IMAGING — CT CT CHEST W/ CM
2 of 4 series · 14 of 36 positions shown, 17 images · IV contrast (omnipaque)
Comparison: 06/14/2020

CLINICAL DATA: # May 2020 -acute bil pul embolism-unprovoked.
Etiology is unclear. #May 2020-pneumonia/hilar and mediastinal
adenopathy. Will order 3-month follow-up CT scan today to assess for
resolution of the pneumonia/lymphadenopathy. # cirrhosis:

EXAM:
CT CHEST WITH CONTRAST
TECHNIQUE: Multidetector CT imaging of the chest was performed during
intravenous contrast administration.
CONTRAST:  75mL OMNIPAQUE IOHEXOL 300 MG/ML  SOLN

[Series 2: axial chest 2.00 · axial · 0.74mm/px · z∈[-1222,-914]mm · 11 of 184 slices shown, 14 images]
[im 15/184  mediastinal]
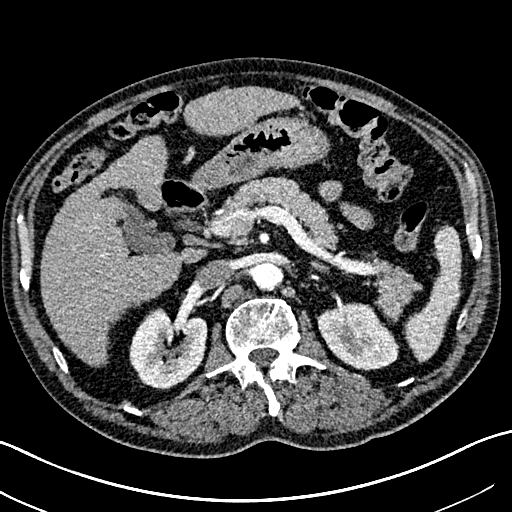
[im 15/184  lung]
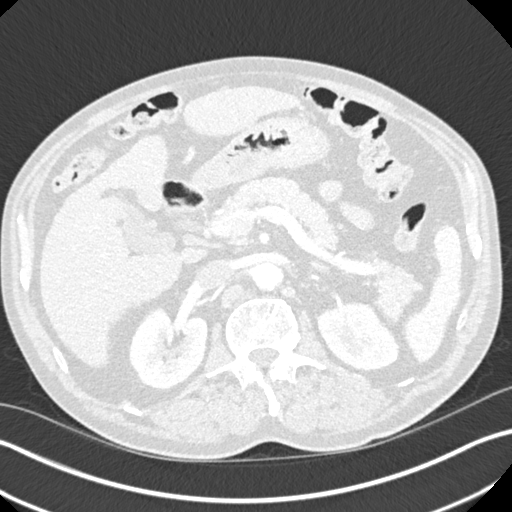
[im 29/184  lung]
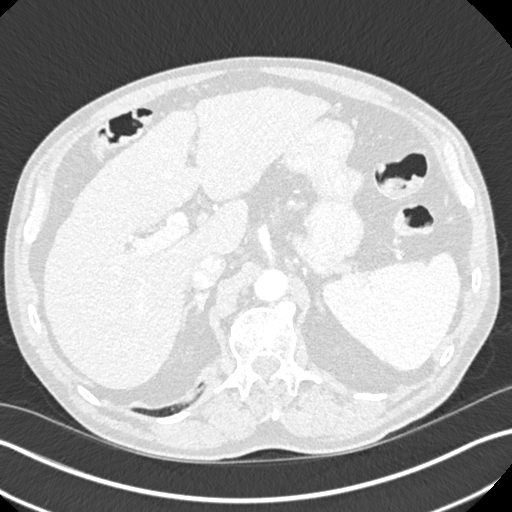
[im 43/184  lung]
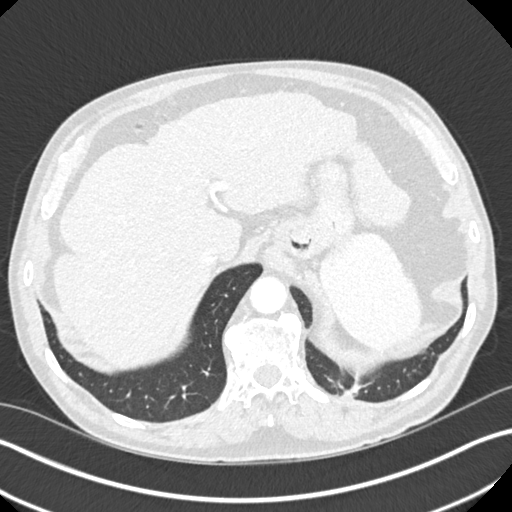
[im 57/184  lung]
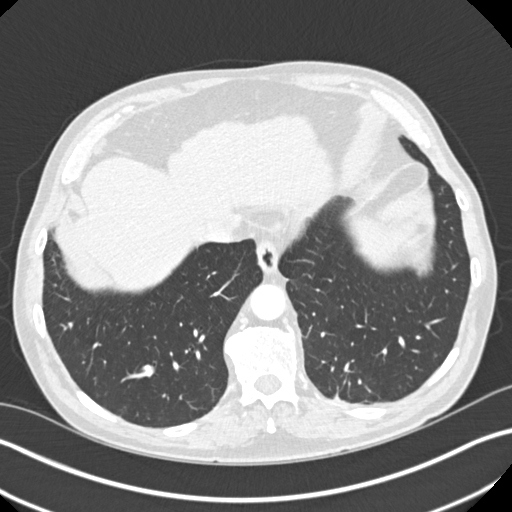
[im 71/184  mediastinal]
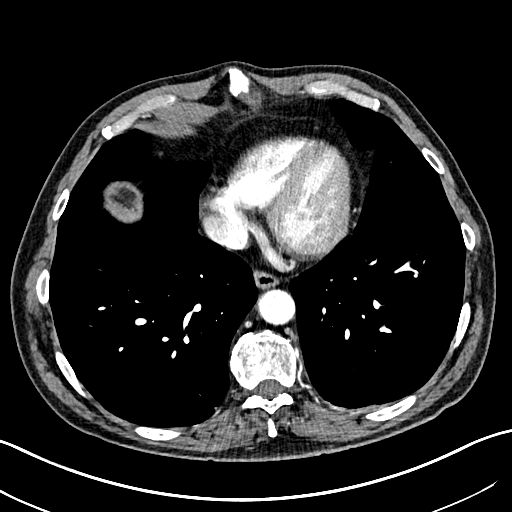
[im 71/184  lung]
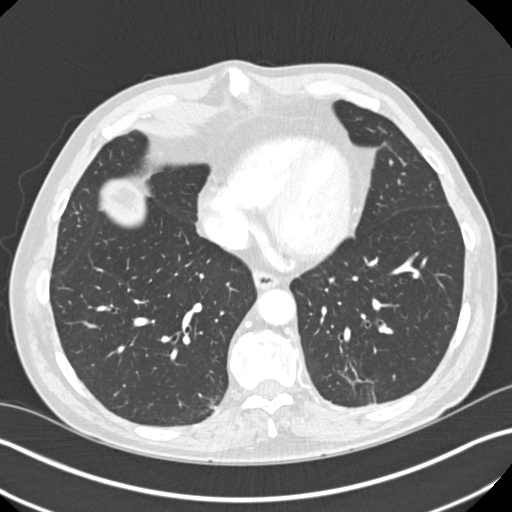
[im 99/184  lung]
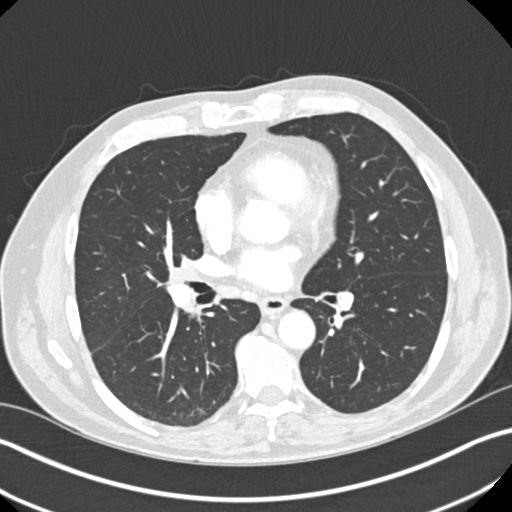
[im 113/184  lung]
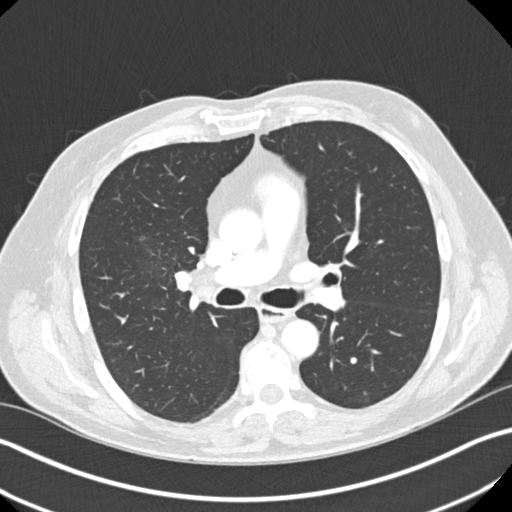
[im 127/184  lung]
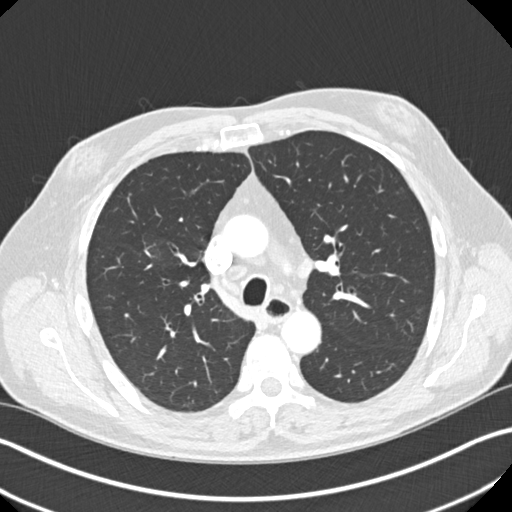
[im 141/184  mediastinal]
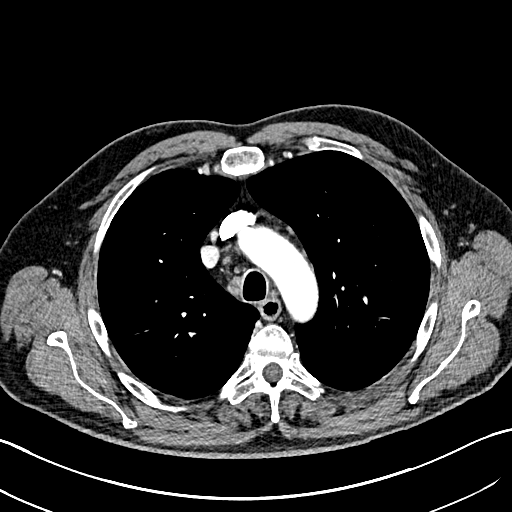
[im 141/184  lung]
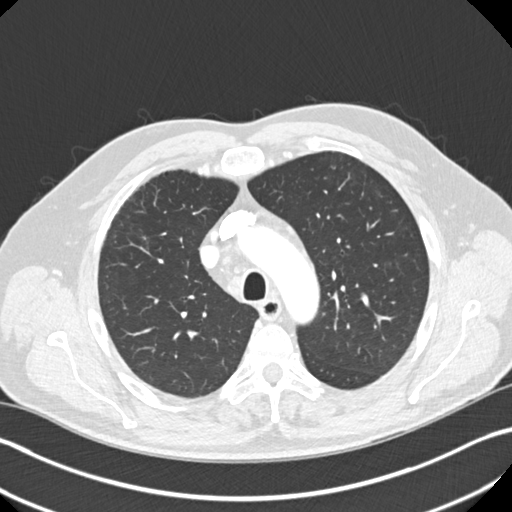
[im 155/184  lung]
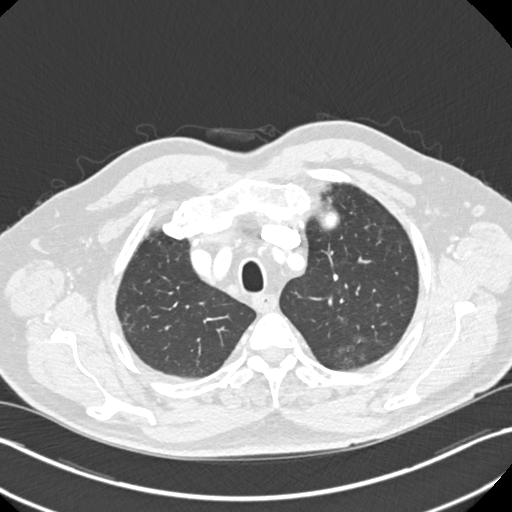
[im 169/184  lung]
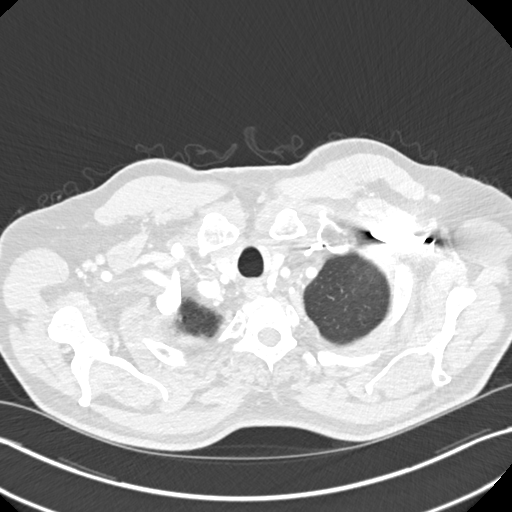

[Series 4: coronal chest 2.00 cor · coronal · 0.72mm/px · 3 of 151 slices shown]
[im 31/151  lung]
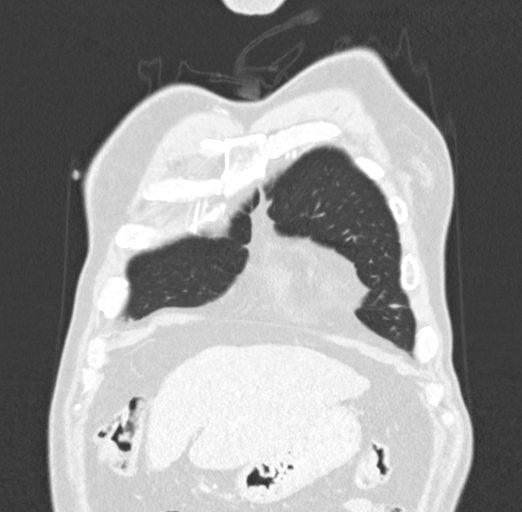
[im 61/151  lung]
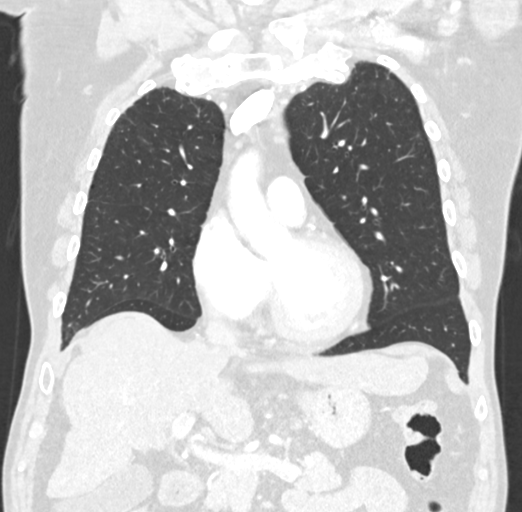
[im 91/151  lung]
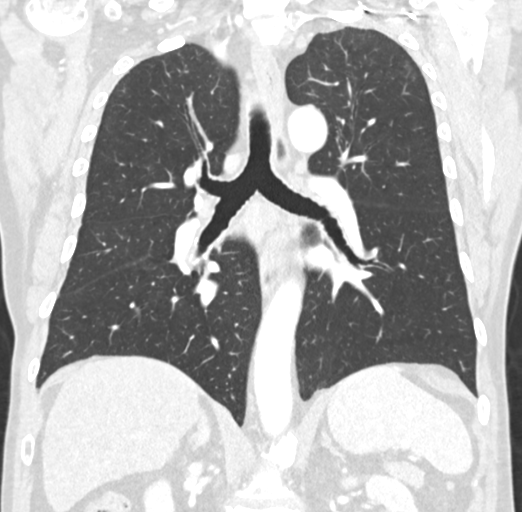

[14 of 36 positions shown; findings below may reference images not displayed]

FINDINGS: Cardiovascular: Left anterior descending coronary artery
atherosclerosis. Mild aortic atherosclerosis.

Today's exam was not protocol to as a CT angiogram of the chest.
That noted, no obvious filling defect is identified in the pulmonary
arterial tree. Areas of thrombus seen on the prior exam, such as
medially in the right lower lobe, appear to have cleared.

Mediastinum/Nodes: Numerous scattered small mediastinal lymph nodes
are not pathologically enlarged by size criteria but are notable in
number and may be reactive. An index right paratracheal node on
image 46 of series 2 measures 0.7 cm in short axis, previously
cm.

A right infrahilar node measures 1.2 cm in short axis on image 93 of
series 2, mildly enlarged, and previously measured 1.4 cm.

Lungs/Pleura: Paraseptal emphysema. Airway thickening is present,
suggesting bronchitis or reactive airways disease. 0.7 by 0.5 cm
posterior basal segment right lower lobe pulmonary nodule on image
136 of series 3, previously somewhat obscured by atelectasis and the
adjacent pulmonary embolus, this merits surveillance. There is some
very faint patchy ground-glass opacities at the lung apices in a
symmetric manner, likely inflammatory or postinflammatory. Minimal
residual scarring in the left lower lobe with some adjacent
localized cylindrical bronchiectasis.

Upper Abdomen: Hepatic cirrhosis. Right gastric and porta hepatis
lymph nodes are all small and likely reactive.

Musculoskeletal: Nonspecific 1.0 by 0.5 cm sclerotic lesion in the
right sixth rib laterally on image 82 of series 3, no change from
the earliest available comparison of 06/05/2020, technically
nonspecific although probably benign.

Midthoracic spondylosis with bridging anterior spurring along the
vertebral body column.
IMPRESSION: 1. Resolution of the prior pulmonary embolus. No current pulmonary
embolus identified.
2. Prior left medial basilar airspace opacities have resolved with
only minimal residual scarring.
3. Airway thickening is present, suggesting bronchitis or reactive
airways disease.
4. Paraseptal emphysema.
5. There is a 7 by 5 by 6 mm (volume = 100 mm^3) posterior basal
segment right lower lobe pulmonary nodule on image 136 of series 3,
previously somewhat obscured by atelectasis and the adjacent
pulmonary embolus. Non-contrast chest CT at 6-12 months is
recommended. If the nodule is stable at time of repeat CT, then
future CT at 18-24 months (from today's scan) is considered optional
for low-risk patients, but is recommended for high-risk patients.
This recommendation follows the consensus statement: Guidelines for
Management of Incidental Pulmonary Nodules Detected on CT Images:
6. Hepatic cirrhosis.
7. Numerous small mediastinal lymph nodes are not pathologically
enlarged by size criteria but are notable in number and may be
reactive.
8. Nonspecific 1.0 by 0.5 cm sclerotic lesion in the right sixth rib
laterally, no change from the earliest available comparison of
06/05/2020, technically nonspecific although probably benign.
9. Left anterior descending coronary artery atherosclerosis.
10. There is some very faint patchy ground-glass opacities at the
lung apices, likely inflammatory or postinflammatory.

Aortic Atherosclerosis (SK4QC-7U2.2) and Emphysema (SK4QC-XPA.N).

## 2021-01-22 NOTE — Telephone Encounter (Signed)
Patient called office at this time-he was instructed to stop Xarelto until further notice per Dr.Pabon- patient verbalized understanding.   Received Anticoagulant clearance from Dr.Brahamnaday -patient needs further work up- scheduled appointment with Dr Rogue Bussing 02/05/2021 .

## 2021-01-22 NOTE — Telephone Encounter (Signed)
Per Dr.Pabon- Sheils would you please send referral to Dr. Rogue Bussing regarding anticoagulation. Please also tell the pt to stop the xarelto given that his INR is too high ( too thin) and we need to investigate further -  Spoke with abraham through interpreter services-  Spoke with patients grandson Gwyndolyn Saxon he will let patient know to call office- patient needs to stop Xarelto due to high INR per Dr.Pabon. Patient will follow up with Dr.Brahamanday.

## 2021-01-22 NOTE — Telephone Encounter (Signed)
Spoke with patient.  *chg apt to 2/8 from 2/22 per Dr. Jacinto Reap. updated patient on plan of care. MD will need to discuss Xarelto instructions prior his surgical procedure. Patient instructed to keep his other apts with Dr. Dahlia Byes and his ct scans.  Spanish Interpreter arranged for md visit.  Clearance Form faxed to Dr. Corlis Leak office.

## 2021-01-22 NOTE — Telephone Encounter (Signed)
Anticoagulant clearance faxed to DR.Brahmanday at this time. Patient is currently taking Xarelto.

## 2021-01-29 ENCOUNTER — Ambulatory Visit
Admission: RE | Admit: 2021-01-29 | Discharge: 2021-01-29 | Disposition: A | Discharge status: Home / Self Care | Payer: Medicaid Other | Source: Ambulatory Visit | Attending: Surgery | Admitting: Surgery

## 2021-01-29 ENCOUNTER — Other Ambulatory Visit: Payer: Self-pay

## 2021-01-29 DIAGNOSIS — K7030 Alcoholic cirrhosis of liver without ascites: Secondary | ICD-10-CM

## 2021-01-29 MED ORDER — IOHEXOL 300 MG/ML  SOLN
100.0000 mL | Freq: Once | INTRAMUSCULAR | Status: AC | PRN
  Administered 2021-01-29: 100 mL via INTRAVENOUS

## 2021-01-31 ENCOUNTER — Encounter: Payer: Self-pay | Admitting: Family

## 2021-01-31 ENCOUNTER — Ambulatory Visit: Payer: Self-pay | Admitting: Family

## 2021-01-31 ENCOUNTER — Other Ambulatory Visit: Payer: Self-pay | Admitting: Gerontology

## 2021-01-31 ENCOUNTER — Other Ambulatory Visit: Payer: Self-pay

## 2021-01-31 ENCOUNTER — Ambulatory Visit: Payer: Self-pay | Attending: Family | Admitting: Family

## 2021-01-31 VITALS — BP 118/78 | HR 92 | Resp 18 | Ht 67.0 in | Wt 182.1 lb

## 2021-01-31 DIAGNOSIS — K7031 Alcoholic cirrhosis of liver with ascites: Secondary | ICD-10-CM

## 2021-01-31 DIAGNOSIS — E1165 Type 2 diabetes mellitus with hyperglycemia: Secondary | ICD-10-CM

## 2021-01-31 DIAGNOSIS — I2782 Chronic pulmonary embolism: Secondary | ICD-10-CM

## 2021-01-31 DIAGNOSIS — K7030 Alcoholic cirrhosis of liver without ascites: Secondary | ICD-10-CM | POA: Insufficient documentation

## 2021-01-31 DIAGNOSIS — Z7984 Long term (current) use of oral hypoglycemic drugs: Secondary | ICD-10-CM | POA: Insufficient documentation

## 2021-01-31 DIAGNOSIS — Z7901 Long term (current) use of anticoagulants: Secondary | ICD-10-CM | POA: Insufficient documentation

## 2021-01-31 DIAGNOSIS — Z87891 Personal history of nicotine dependence: Secondary | ICD-10-CM | POA: Insufficient documentation

## 2021-01-31 DIAGNOSIS — I5032 Chronic diastolic (congestive) heart failure: Secondary | ICD-10-CM | POA: Insufficient documentation

## 2021-01-31 DIAGNOSIS — E119 Type 2 diabetes mellitus without complications: Secondary | ICD-10-CM | POA: Insufficient documentation

## 2021-01-31 DIAGNOSIS — Z86711 Personal history of pulmonary embolism: Secondary | ICD-10-CM | POA: Insufficient documentation

## 2021-01-31 MED ORDER — SPIRONOLACTONE 100 MG PO TABS
ORAL_TABLET | ORAL | 3 refills | Status: DC
Start: 2021-01-31 — End: 2021-01-31

## 2021-01-31 NOTE — Progress Notes (Signed)
Patient ID: Collin Byrd, male    DOB: 1961-08-07, 60 y.o.   MRN: [id number]  HPI  Interpreter showed up for appointment but patient declined saying that he didn't need her here.    Collin Byrd is a 60 y/o male with a history of DM, cataract, PE, cirrhosis, previous tobacco/ alcohol use and chronic heart failure.   Echo report from 06/05/20 reviewed and showed an EF of >55% along with moderate LVH.   Has not been admitted or been in the ED in the last 6 months.   He presents today with a chief complaint of a follow-up visit. Reports chronic difficulty sleeping that has been present for several years. Has some easy bruising along with this. Denies any dizziness, abdominal distention, palpitations, pedal edema, chest pain, shortness of breath, cough, fatigue or weight gain.   Denies having any issues with any of his medications and is currently off of xarelto.   Past Medical History:  Diagnosis Date  . Cataract   . CHF (congestive heart failure) ([place])   . Cirrhosis ([place])   . Diabetes mellitus without complication ([place])   . Pulmonary embolus [facility])    Past Surgical History:  Procedure Laterality Date  . CATARACT EXTRACTION    . COLONOSCOPY WITH PROPOFOL N/A 01/03/2021   Procedure: COLONOSCOPY WITH PROPOFOL;  Surgeon: Lin Landsman, MD;  Location[facility] ENDOSCOPY;  Service: Gastroenterology;  Laterality: N/A;  . ESOPHAGOGASTRODUODENOSCOPY (EGD) WITH PROPOFOL N/A 01/03/2021   Procedure: ESOPHAGOGASTRODUODENOSCOPY (EGD) WITH PROPOFOL;  Surgeon: Lin Landsman, MD;  Location: [facility] ENDOSCOPY;  Service: Gastroenterology;  Laterality: N/A;   No family history on file. Social History   Tobacco Use  . Smoking status: Former Smoker    Packs/day: 0.25    Types: Cigarettes  . Smokeless tobacco: Never Used  Substance Use Topics  . Alcohol use: Not Currently   No Known Allergies  Prior to Admission medications   Medication Sig Start Date End Date Taking? Authorizing Provider   artificial tears (LACRILUBE) OINT ophthalmic ointment Place into the right eye 3 (three) times daily. 10/30/20  Yes Iloabachie, Chioma E, NP  blood glucose meter kit and supplies KIT Dispense based on patient and insurance preference. Use up to four times daily as directed. (FOR ICD-9 250.00, 250.01). 07/12/20  Yes Iloabachie, Chioma E, NP  furosemide (LASIX) 40 MG tablet Take 1 tablet (40 mg total) by mouth daily. 12/18/20  Yes Alvie Fowles, Otila Kluver A, FNP  glimepiride (AMARYL) 1 MG tablet Take 1 tablet (1 mg total) by mouth daily with breakfast. 09/26/20  Yes Iloabachie, Chioma E, NP  Omega-3 Fatty Acids (FISH OIL) 1000 MG CAPS Take 2 capsules (2,000 mg total) by mouth in the morning and at bedtime. 11/28/20  Yes Tukov-Yual, Magdalene S, NP  pantoprazole (PROTONIX) 40 MG tablet TAKE ONE TABLET BY MOUTH EVERY DAY 01/09/21  Yes Iloabachie, Chioma E, NP  thiamine (VITAMIN B-1) 100 MG tablet TAKE ONE TABLET BY MOUTH EVERY DAY 01/09/21  Yes Iloabachie, Chioma E, NP  folic acid (FOLVITE) 1 MG tablet Take 1 tablet (1 mg total) by mouth daily. Patient not taking: Reported on 01/31/2021 09/26/20   Iloabachie, Chioma E, NP  spironolactone (ALDACTONE) 100 MG tablet TAKE ONE TABLET BY MOUTH 2 TIMES A DAY. 01/31/21   Jean Alejos A, FNP  XARELTO 20 MG TABS tablet TAKE ONE TABLET BY MOUTH EVERY DAY WITH SUPPER Patient not taking: Reported on 01/31/2021 11/12/20   Cammie Sickle, MD    Review of Systems  Constitutional: Negative for activity change, appetite change, diaphoresis and fatigue.  HENT: Negative for congestion, postnasal drip and sore throat.   Respiratory: Negative for cough, chest tightness and shortness of breath.   Cardiovascular: Negative for chest pain, palpitations and leg swelling.  Gastrointestinal: Negative for abdominal distention and abdominal pain.  Endocrine: Negative.   Genitourinary: Negative.   Musculoskeletal: Negative for back pain, gait problem and neck pain.  Skin: Negative.    Allergic/Immunologic: Negative.   Neurological: Negative for dizziness and light-headedness.  Hematological: Negative for adenopathy. Bruises/bleeds easily.  Psychiatric/Behavioral: Positive for sleep disturbance (sleeping on 1 pillow). Negative for dysphoric mood. The patient is not nervous/anxious.    Vitals:   01/31/21 1159  BP: 118/78  Pulse: 92  Resp: 18  SpO2: 100%  Weight: 182 lb 2 oz (82.6 kg)  Height: 5' 7"  (1.702 m)   Wt Readings from Last 3 Encounters:  01/31/21 182 lb 2 oz (82.6 kg)  01/21/21 182 lb (82.6 kg)  01/03/21 186 lb (84.4 kg)   Lab Results  Component Value Date   CREATININE 0.80 01/21/2021   CREATININE 0.92 11/14/2020   CREATININE 0.90 10/23/2020    Physical Exam Vitals and nursing note reviewed.  Constitutional:      Appearance: Normal appearance.  HENT:     Head: Normocephalic and atraumatic.  Cardiovascular:     Rate and Rhythm: Normal rate and regular rhythm.  Pulmonary:     Effort: Pulmonary effort is normal. No respiratory distress.     Breath sounds: No wheezing or rales.  Abdominal:     General: There is no distension.     Palpations: Abdomen is soft.     Tenderness: There is no abdominal tenderness.  Musculoskeletal:        General: No tenderness.     Cervical back: Normal range of motion.     Right lower leg: No edema.     Left lower leg: No edema.  Skin:    General: Skin is warm and dry.  Neurological:     General: No focal deficit present.     Mental Status: He is alert and oriented to person, place, and time.  Psychiatric:        Mood and Affect: Mood normal.        Behavior: Behavior normal.        Thought Content: Thought content normal.    Assessment & Plan:  1: Chronic heart failure with preserved ejection fraction with structural changes (LVH)- - NYHA class I - euvolemic today - weighing daily; reminded to call for an overnight weight gain of >2 pounds or a weekly weight gain of >5 pounds; home weight chart was  reviewed, no sudden weight gain noted - Weight down 8 pounds from last visit here 3 months ago - not adding salt to his food  - BP may not allow for further titration of entresto - BMP 01/21/21 reviewed and showed sodium 138, potassium 4.3, creatinine 0.80 and GFR >60 - BNP 06/05/20 was 155.9  2: DM- - saw PCP @ [facility] 11/28/20 - glucose at home today was 107 - A1c 11/14/20 was 0.6%  3: Alcoholic cirrhosis- - says that he hasn't had any alcohol since June 2021. - saw GI ([place]) 11/28/20; endoscopy done 01/03/21  4: PE- - saw hematology Rogue Bussing) 10/23/20 - currently off of xarelto    Medication bottles were reviewed.   Return in 3 months or sooner for any questions/ problems before then. Asked patient  if he would like an interpreter scheduled for his next visit and he declines.

## 2021-01-31 NOTE — Patient Instructions (Signed)
Continue weighing daily and call for an overnight weight gain of > 2 pounds or a weekly weight gain of >5 pounds. 

## 2021-02-04 ENCOUNTER — Encounter: Payer: Self-pay | Admitting: Surgery

## 2021-02-04 ENCOUNTER — Other Ambulatory Visit: Payer: Self-pay

## 2021-02-04 ENCOUNTER — Ambulatory Visit (INDEPENDENT_AMBULATORY_CARE_PROVIDER_SITE_OTHER): Payer: Self-pay | Admitting: Surgery

## 2021-02-04 VITALS — BP 125/83 | HR 79 | Temp 98.3°F | Ht 67.0 in | Wt 184.8 lb

## 2021-02-04 DIAGNOSIS — K4030 Unilateral inguinal hernia, with obstruction, without gangrene, not specified as recurrent: Secondary | ICD-10-CM

## 2021-02-04 NOTE — Progress Notes (Signed)
Outpatient Surgical Follow Up  02/04/2021  Collin Byrd is an 60 y.o. male.   Chief Complaint  Patient presents with  . Follow-up    Left inguinal hernia    HPI: Collin Byrd is a 60 year old male well-known to me with a history of cirrhosis [name] a but this has been confounding given that his INR was close to 3.  Patient was anticoagulated for a PE.  I have discussed the case in detail with Dr. Rogue Bussing from hematology and we have decided on holding Xarelto at this time.  He does have a face-to-face appointment with him tomorrow and repeat labs.  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  Last time his CMP was pretty normal except mild elevation of bilirubin to 1.4 but his INR was 2.9.  The time he was actively on Xarelto CT showed a normal hemoglobin and morning probably normal platelet counts.  He continues to have intermittent left inguinal that is mild to moderate in nature.  No evidence of obstruction.  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 He is on both spironolactone and Lasix   Past Medical History:  Diagnosis Date  . Cataract   . CHF (congestive heart failure) ([place])   . Cirrhosis ([place])   . Diabetes mellitus without complication ([place])   . Pulmonary embolus [facility])     Past Surgical History:  Procedure Laterality Date  . CATARACT EXTRACTION    . COLONOSCOPY WITH PROPOFOL N/A 01/03/2021   Procedure: COLONOSCOPY WITH PROPOFOL;  Surgeon: Lin Landsman, MD;  Location: [facility] ENDOSCOPY;  Service: Gastroenterology;  Laterality: N/A;  . ESOPHAGOGASTRODUODENOSCOPY (EGD) WITH PROPOFOL N/A 01/03/2021   Procedure: ESOPHAGOGASTRODUODENOSCOPY (EGD) WITH PROPOFOL;  Surgeon: Lin Landsman, MD;  Location: [facility] ENDOSCOPY;  Service: Gastroenterology;  Laterality: N/A;    History reviewed. No pertinent family history.  Social History:  reports that he  has quit smoking. His smoking use included cigarettes. He smoked 0.25 packs per day. He has never used smokeless tobacco. He reports previous alcohol use. He reports that he does not use drugs.  Allergies: No Known Allergies  Medications reviewed.    ROS Full ROS performed and is otherwise negative other than what is stated in HPI   BP 125/83   Pulse 79   Temp 98.3 F (36.8 C) (Oral)   Ht 5\' 7"  (1.702 m)   Wt 184 lb 12.8 oz (83.8 kg)   SpO2 98%   BMI 28.94 kg/m   Physical Exam CONSTITUTIONAL: NAD EYES: Pupils are equal, round, Sclera are non-icteric. EARS, NOSE, MOUTH AND THROAT:HE is wearing a mask. Hearing is intact to voice. LYMPH NODES:  Lymph nodes in the neck are normal. RESPIRATORY:  Lungs are clear. There is normal respiratory effort, with equal breath sounds bilaterally, and without pathologic use of accessory muscles. CARDIOVASCULAR: Heart is regular without murmurs, gallops, or rubs. GI: The abdomen is  soft, nontender, and nondistended. There are no palpable masses. There is no hepatosplenomegaly. There are normal bowel sounds in all quadrants. There is reducible umbilical hernia and chronically incarcerated Giant Left inguinal hernia, no peritonitis. GU: Rectal deferred.   MUSCULOSKELETAL: Normal muscle strength and tone. No cyanosis or edema.   SKIN: Turgor is good and there are no pathologic skin lesions or ulcers. NEUROLOGIC: Motor and sensation is grossly normal. Cranial nerves are grossly intact. PSYCH:  Oriented to person, place and time.  Affect is normal.    Assessment/Plan: 60 year old male with cirrhosis Childs A presumably history of PE and giant chronically incarcerated left inguinal hernia.  Given that his INR was severely elevated and he was on anticoagulation I have requested evaluation by hematology.  They will see him tomorrow.  I do think that one repeat LFTs will have a better idea about his liver function and its reserve.  We will have to  develop a game plan regarding anticoagulation and potential surgery.  This will not be an easy case given that his defect has multiple loops of bowel and I anticipated this medically complex and challenging case given with a robotic approach.  Unfortunately no good solutions for this gentleman.  I do think that methodical planning best interest.  There is no need for any emergent surgical intervention at this time.  Extensive counseling provided  Greater than 50% of the 45 minutes  visit was spent in counseling/coordination of care   Caroleen Hamman, MD [place] Surgeon

## 2021-02-04 NOTE — Patient Instructions (Signed)
Our surgery scheduler will call you within 24-48 hours to schedule your surgery, Please have the Blue surgery sheet available when speaking with her.    Inguinal Hernia, Adult An inguinal hernia is when fat or your intestines push through a weak spot in a muscle where your leg meets your lower belly (groin). This causes a bulge. This kind of hernia could also be:  In your scrotum, if you are male.  In folds of skin around your vagina, if you are male. There are three types of inguinal hernias:  Hernias that can be pushed back into the belly (are reducible). This type rarely causes pain.  Hernias that cannot be pushed back into the belly (are incarcerated).  Hernias that cannot be pushed back into the belly and lose their blood supply (are strangulated). This type needs emergency surgery. What are the causes? This condition is caused by having a weak spot in the muscles or tissues in your groin. This develops over time. The hernia may poke through the weak spot when you strain your lower belly muscles all of a sudden, such as when you:  Lift a heavy object.  Strain to poop (have a bowel movement). Trouble pooping (constipation) can lead to straining.  Cough. What increases the risk? This condition is more likely to develop in:  Males.  Pregnant females.  People who: ? Are overweight. ? Work in jobs that require long periods of standing or heavy lifting. ? Have had an inguinal hernia before. ? Smoke or have lung disease. These factors can lead to long-term (chronic) coughing. What are the signs or symptoms? Symptoms may depend on the size of the hernia. Often, a small hernia has no symptoms. Symptoms of a larger hernia may include:  A bulge in the groin area. This is easier to see when standing. You might not be able to see it when you are lying down.  Pain or burning in the groin. This may get worse when you lift, strain, or cough.  A dull ache or a feeling of pressure in  the groin.  An abnormal bulge in the scrotum, in males. Symptoms of a strangulated inguinal hernia may include:  A bulge in your groin that is very painful and tender to the touch.  A bulge that turns red or purple.  Fever, feeling like you may vomit (nausea), and vomiting.  Not being able to poop or to pass gas. How is this treated? Treatment depends on the size of your hernia and whether you have symptoms. If you do not have symptoms, your doctor may have you watch your hernia carefully and have you come in for follow-up visits. If your hernia is large or if you have symptoms, you may need surgery to repair the hernia. Follow these instructions at home: Lifestyle  Avoid lifting heavy objects.  Avoid standing for long amounts of time.  Do not smoke or use any products that contain nicotine or tobacco. If you need help quitting, ask your doctor.  Stay at a healthy weight. Prevent trouble pooping You may need to take these actions to prevent or treat trouble pooping:  Drink enough fluid to keep your pee (urine) pale yellow.  Take over-the-counter or prescription medicines.  Eat foods that are high in fiber. These include beans, whole grains, and fresh fruits and vegetables.  Limit foods that are high in fat and sugar. These include fried or sweet foods. General instructions  You may try to push your hernia back in  place by very gently pressing on it when you are lying down. Do not try to push the bulge back in if it will not go in easily.  Watch your hernia for any changes in shape, size, or color. Tell your doctor if you see any changes.  Take over-the-counter and prescription medicines only as told by your doctor.  Keep all follow-up visits. Contact a doctor if:  You have a fever or chills.  You have new symptoms.  Your symptoms get worse. Get help right away if:  You have pain in your groin that gets worse all of a sudden.  You have a bulge in your groin  that: ? Gets bigger all of a sudden, and it does not get smaller after that. ? Turns red or purple. ? Is painful when you touch it.  You are a male, and you have: ? Sudden pain in your scrotum. ? A sudden change in the size of your scrotum.  You cannot push the hernia back in place by very gently pressing on it when you are lying down.  You feel like you may vomit, and that feeling does not go away.  You keep vomiting.  You have a fast heartbeat.  You cannot poop or pass gas. These symptoms may be an emergency. Get help right away. Call your local emergency services (911 in the [place]  Do not wait to see if the symptoms will go away.  Do not drive yourself to the hospital. Summary  An inguinal hernia is when fat or your intestines push through a weak spot in a muscle where your leg meets your lower belly (groin). This causes a bulge.  If you do not have symptoms, you may not need treatment. If you have symptoms or a large hernia, you may need surgery.  Avoid lifting heavy objects. Also, avoid standing for long amounts of time.  Do not try to push the bulge back in if it will not go in easily. This information is not intended to replace advice given to you by your health care provider. Make sure you discuss any questions you have with your health care provider. Document Revised: 08/14/2020 Document Reviewed: 08/14/2020 Elsevier Patient Education  2021 [employer].

## 2021-02-05 ENCOUNTER — Inpatient Hospital Stay: Payer: Self-pay

## 2021-02-05 ENCOUNTER — Encounter: Payer: Self-pay | Admitting: Internal Medicine

## 2021-02-05 ENCOUNTER — Inpatient Hospital Stay: Payer: Self-pay | Attending: Internal Medicine | Admitting: Internal Medicine

## 2021-02-05 DIAGNOSIS — Z7901 Long term (current) use of anticoagulants: Secondary | ICD-10-CM | POA: Insufficient documentation

## 2021-02-05 DIAGNOSIS — D689 Coagulation defect, unspecified: Secondary | ICD-10-CM | POA: Insufficient documentation

## 2021-02-05 DIAGNOSIS — I2699 Other pulmonary embolism without acute cor pulmonale: Secondary | ICD-10-CM

## 2021-02-05 DIAGNOSIS — F17200 Nicotine dependence, unspecified, uncomplicated: Secondary | ICD-10-CM | POA: Insufficient documentation

## 2021-02-05 DIAGNOSIS — R911 Solitary pulmonary nodule: Secondary | ICD-10-CM | POA: Insufficient documentation

## 2021-02-05 DIAGNOSIS — K7460 Unspecified cirrhosis of liver: Secondary | ICD-10-CM | POA: Insufficient documentation

## 2021-02-05 LAB — CBC WITH DIFFERENTIAL/PLATELET
Abs Immature Granulocytes: 0.05 10*3/uL (ref 0.00–0.07)
Basophils Absolute: 0 10*3/uL (ref 0.0–0.1)
Basophils Relative: 1 %
Eosinophils Absolute: 0.1 10*3/uL (ref 0.0–0.5)
Eosinophils Relative: 1 %
HCT: 40.3 % (ref 39.0–52.0)
Hemoglobin: 13.8 g/dL (ref 13.0–17.0)
Immature Granulocytes: 1 %
Lymphocytes Relative: 8 %
Lymphs Abs: 0.6 10*3/uL — ABNORMAL LOW (ref 0.7–4.0)
MCH: 31.7 pg (ref 26.0–34.0)
MCHC: 34.2 g/dL (ref 30.0–36.0)
MCV: 92.6 fL (ref 80.0–100.0)
Monocytes Absolute: 0.6 10*3/uL (ref 0.1–1.0)
Monocytes Relative: 8 %
Neutro Abs: 6.2 10*3/uL (ref 1.7–7.7)
Neutrophils Relative %: 81 %
Platelets: 227 10*3/uL (ref 150–400)
RBC: 4.35 MIL/uL (ref 4.22–5.81)
RDW: 12.5 % (ref 11.5–15.5)
WBC: 7.6 10*3/uL (ref 4.0–10.5)
nRBC: 0 % (ref 0.0–0.2)

## 2021-02-05 LAB — COMPREHENSIVE METABOLIC PANEL
ALT: 20 U/L (ref 0–44)
AST: 20 U/L (ref 15–41)
Albumin: 4.5 g/dL (ref 3.5–5.0)
Alkaline Phosphatase: 68 U/L (ref 38–126)
Anion gap: 11 (ref 5–15)
BUN: 20 mg/dL (ref 6–20)
CO2: 26 mmol/L (ref 22–32)
Calcium: 9.5 mg/dL (ref 8.9–10.3)
Chloride: 99 mmol/L (ref 98–111)
Creatinine, Ser: 0.78 mg/dL (ref 0.61–1.24)
GFR, Estimated: >60 mL/min (ref >60)
Glucose, Bld: 86 mg/dL (ref 70–99)
Potassium: 4.2 mmol/L (ref 3.5–5.1)
Sodium: 136 mmol/L (ref 135–145)
Total Bilirubin: 1.3 mg/dL — ABNORMAL HIGH (ref 0.3–1.2)
Total Protein: 8.2 g/dL — ABNORMAL HIGH (ref 6.5–8.1)

## 2021-02-05 LAB — APTT: aPTT: 31 seconds (ref 24–36)

## 2021-02-05 LAB — PROTIME-INR
INR: 1.2 (ref 0.8–1.2)
Prothrombin Time: 14.3 seconds (ref 11.4–15.2)

## 2021-02-05 NOTE — Progress Notes (Signed)
[name] NOTE  Patient Care Team: Langston Reusing, NP as PCP - General (Gerontology)  CHIEF COMPLAINTS/PURPOSE OF CONSULTATION: DVT/PE  #  June 16th,2021- Acute bil Segmentl/sub-segmental PE; no right heart strain.  Unprovoked.-Recommend 1 year anticoagulation; STOPPED in mid-Late JAN 2022 [elevated PT/INR on pre-op blood work]  # Mediastinal/Hilar adenopathy; Pneumonia [June, 2021]  # Cirrhosis [normal WBC/platelets; mild anemia] ? Alcohol' # JAN 2022-EGD/ colonoscopy-ingiunal hernia [Dr.Vanga]  Oncology History   No history exists.   HISTORY OF PRESENTING ILLNESS:  Collin Byrd 60 y.o.  male history of alcoholism/cirrhosis-and history of bilateral PE on xarelto is here for follow-up.  In the interim patient was evaluated by Dr.Pabone surgery for hernia repair.  Patient noted to have supratherapeutic INR-referred to [place] for further evaluation/clearance prior to surgical repair.  Patient is currently off anticoagulation in preparation for his surgery. Stopped 3 weeks ago.   No blood in stools or black in stools. No difficulty breathing.   Review of Systems  Constitutional: Positive for malaise/fatigue. Negative for chills, diaphoresis, fever and weight loss.  HENT: Negative for nosebleeds and sore throat.   Eyes: Negative for double vision.  Respiratory: Negative for cough, hemoptysis, sputum production, shortness of breath and wheezing.   Cardiovascular: Negative for chest pain, palpitations, orthopnea and leg swelling.  Gastrointestinal: Negative for abdominal pain, blood in stool, constipation, diarrhea, heartburn, melena, nausea and vomiting.  Genitourinary: Negative for dysuria, frequency and urgency.  Musculoskeletal: Negative for back pain and joint pain.  Skin: Negative.  Negative for itching and rash.  Neurological: Negative for dizziness, tingling, focal weakness, weakness and headaches.  Psychiatric/Behavioral: Negative for depression.  The patient is not nervous/anxious and does not have insomnia.      MEDICAL HISTORY:  Past Medical History:  Diagnosis Date  . Cataract   . CHF (congestive heart failure) ([place])   . Cirrhosis ([place])   . Diabetes mellitus without complication ([place])   . Pulmonary embolus ([place])     SURGICAL HISTORY: Past Surgical History:  Procedure Laterality Date  . CATARACT EXTRACTION    . COLONOSCOPY WITH PROPOFOL N/A 01/03/2021   Procedure: COLONOSCOPY WITH PROPOFOL;  Surgeon: Lin Landsman, MD;  Location: [facility] ENDOSCOPY;  Service: Gastroenterology;  Laterality: N/A;  . ESOPHAGOGASTRODUODENOSCOPY (EGD) WITH PROPOFOL N/A 01/03/2021   Procedure: ESOPHAGOGASTRODUODENOSCOPY (EGD) WITH PROPOFOL;  Surgeon: Lin Landsman, MD;  Location: [facility] ENDOSCOPY;  Service: Gastroenterology;  Laterality: N/A;    SOCIAL HISTORY: Social History   Socioeconomic History  . Marital status: Married    Spouse name: Not on file  . Number of children: Not on file  . Years of education: Not on file  . Highest education level: Not on file  Occupational History  . Not on file  Tobacco Use  . Smoking status: Former Smoker    Packs/day: 0.25    Types: Cigarettes  . Smokeless tobacco: Never Used  Vaping Use  . Vaping Use: Never used  Substance and Sexual Activity  . Alcohol use: Not Currently  . Drug use: Never  . Sexual activity: Not Currently  Other Topics Concern  . 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 Smoking- 1-3 cig/day. Lives in [place]; with wife. No children.    Social Determinants of Health   Financial Resource Strain: Not on file  Food Insecurity: Not on file  Transportation Needs: Not on file  Physical Activity: Not on file  Stress: Not on file  Social Connections: Not on file  Intimate Partner Violence: Not on file    FAMILY HISTORY: No family history on file.  ALLERGIES:  has No Known  Allergies.  MEDICATIONS:  Current Outpatient Medications  Medication Sig Dispense Refill  . artificial tears (LACRILUBE) OINT ophthalmic ointment Place into the right eye 3 (three) times daily. 1 g 0  . blood glucose meter kit and supplies KIT Dispense based on patient and insurance preference. Use up to four times daily as directed. (FOR ICD-9 250.00, 250.01). 1 each 0  . folic acid (FOLVITE) 1 MG tablet Take 1 mg by mouth daily.    . furosemide (LASIX) 40 MG tablet Take 1 tablet (40 mg total) by mouth daily. 90 tablet 3  . glimepiride (AMARYL) 1 MG tablet Take 1 mg by mouth daily with breakfast.    . Omega-3 Fatty Acids (FISH OIL) 1000 MG CAPS Take 2 capsules (2,000 mg total) by mouth in the morning and at bedtime. 60 capsule 2  . pantoprazole (PROTONIX) 40 MG tablet TAKE ONE TABLET BY MOUTH EVERY DAY 90 tablet 0  . sacubitril-valsartan (ENTRESTO) 24-26 MG Take 1 tablet by mouth 2 (two) times daily.    Marland Kitchen spironolactone (ALDACTONE) 100 MG tablet TAKE ONE TABLET BY MOUTH 2 TIMES A DAY. 180 tablet 3  . thiamine (VITAMIN B-1) 100 MG tablet TAKE ONE TABLET BY MOUTH EVERY DAY 30 tablet 2   No current facility-administered medications for this visit.      Marland Kitchen  PHYSICAL EXAMINATION:  Vitals:   02/05/21 1013  BP: 110/81  Pulse: 78  Resp: 16  Temp: 98.5 F (36.9 C)  SpO2: 100%   Filed Weights   02/05/21 1013  Weight: 185 lb (83.9 kg)    Physical Exam HENT:     Head: Normocephalic and atraumatic.     Mouth/Throat:     Pharynx: No oropharyngeal exudate.  Eyes:     Pupils: Pupils are equal, round, and reactive to light.  Cardiovascular:     Rate and Rhythm: Normal rate and regular rhythm.  Pulmonary:     Effort: No respiratory distress.     Breath sounds: No wheezing.  Abdominal:     General: Bowel sounds are normal. There is no distension.     Palpations: Abdomen is soft. There is no mass.     Tenderness: There is no abdominal tenderness. There is no guarding or rebound.   Musculoskeletal:        General: No tenderness. Normal range of motion.     Cervical back: Normal range of motion and neck supple.  Skin:    General: Skin is warm.  Neurological:     Mental Status: He is alert and oriented to person, place, and time.  Psychiatric:        Mood and Affect: Affect normal.      LABORATORY DATA:  I have reviewed the data as listed Lab Results  Component Value Date   WBC 7.6 02/05/2021   HGB 13.8 02/05/2021   HCT 40.3 02/05/2021   MCV 92.6 02/05/2021   PLT 227 02/05/2021   Recent Labs    06/05/20 0933 06/06/20 0106 06/21/20 0448 07/24/20 1215 10/23/20 1027 11/14/20 1126 01/21/21 1252 02/05/21 0959  NA 129*   < > 133* 139   < > 139 138 136  K 4.0   < > 3.8 4.1   < > 4.1 4.3 4.2  CL 97*   < > 98  105   < > 103 100 99  CO2 25   < > 26 25   < > _0 GLUCOSE 183*   < > 184* 124*   < > 105* 110* 86  BUN 9   < > 22* 14   < > _1 CREATININE 0.83   < > 0.79 1.08   < > 0.92 0.80 0.78  CALCIUM 6.8*   < > 8.8* 9.1   < > 9.7 9.7 9.5  GFRNONAA >60   < > >60 >60   < > 92 >60 >60  GFRAA >60   < > >60 >60  --  106  --   --   PROT 5.7*   < >  --  7.6  --   --  8.2* 8.2*  ALBUMIN 1.7*  1.7*   < >  --  3.9  --   --  4.5 4.5  AST 25   < >  --  20  --   --  19 20  ALT 22   < >  --  15  --   --  18 20  ALKPHOS 59   < >  --  55  --   --  62 68  BILITOT 1.0   < >  --  1.1  --   --  1.4* 1.3*  BILIDIR 0.2  --   --   --   --   --   --   --   IBILI 0.8  --   --   --   --   --   --   --    < > = values in this interval not displayed.    RADIOGRAPHIC STUDIES: I have personally reviewed the radiological images as listed and agreed with the findings in the report. CT Abdomen Pelvis W Contrast  Result Date: 01/29/2021 CLINICAL DATA:  Alcoholic cirrhosis.  Inguinal hernia. EXAM: CT ABDOMEN AND PELVIS WITH CONTRAST TECHNIQUE: Multidetector CT imaging of the abdomen and pelvis was performed using the standard protocol following bolus administration of  intravenous contrast. CONTRAST:  144m OMNIPAQUE IOHEXOL 300 MG/ML  SOLN COMPARISON:  06/05/2020 FINDINGS: Lower chest: The visualized lung bases are clear. Minimal coronary artery calcification. Global cardiac size within normal limits. No pericardial effusion. Hepatobiliary: The liver is cirrhotic. No focal intrahepatic mass is identified. There is no intrahepatic or extrahepatic biliary ductal dilation. Gallbladder unremarkable. Pancreas: Unremarkable. No pancreatic ductal dilatation or surrounding inflammatory changes. Spleen: Unremarkable Adrenals/Urinary Tract: Adrenal glands are unremarkable. Kidneys are normal, without renal calculi, focal lesion, or hydronephrosis. Bladder is unremarkable. Stomach/Bowel: A large left indirect inguinal hernia is present containing portions of the a distal descending and proximal sigmoid colon which appear unremarkable. The stomach, small bowel, and large bowel are otherwise unremarkable. Appendix normal. No evidence of obstruction or focal inflammation. No free intraperitoneal gas or fluid. Small fat containing umbilical hernia noted. Vascular/Lymphatic: Mild aortoiliac atherosclerotic calcification. No aneurysm. No pathologic adenopathy within the abdomen and pelvis. Reproductive: Mild prostatic enlargement. Seminal vesicles unremarkable. Other: Rectum unremarkable. Musculoskeletal: No acute bone abnormality. No lytic or blastic bone lesions are seen. IMPRESSION: Cirrhosis.  No focal hepatic mass identified. Large left indirect inguinal hernia containing and unremarkable loop of distal colon. No evidence of obstruction or focal inflammation. Aortic Atherosclerosis (ICD10-I70.0). Electronically Signed   By: AFidela SalisburyMD   On: 01/29/2021 15:29    ASSESSMENT & PLAN:   Acute pulmonary embolus (  [facility]) # June 2021 -acute bil pul embolism-unprovoked.  Etiology is unclear.  Stop Xarelto; January 2022 [6 months of anticoagulation].   #June 2021-pneumonia/hilar and  mediastinal adenopathy-CT scan November 2021 -stable/improved with gastroenteropathy.  6-7 mm nodule- right lower lobe pulmonary-need surveillance in 6-12 months.   #Elevated PT/INR-PTT-secondary Xarelto [especially given patient's underlying cirrhosis].  Coagulopathy is certainly NOT attributable completely to cirrhosis/liver failure.  # cirrhosis: ? Alcohol- quit since June 2021.  No evidence of decompensation.  #Active smoker-encouraged patient to quit smoking.   # DISPOSITION:  # follow up in 6 months- MD; labs- cbc/cmp/- Dr.B  Addendum: Patient's PT/INR-off anticoagulation is normal. [pt-14;PTT-31- WNL]. will discuss with Dr.Pabone re: post op DVT/PE porphylaxis.     All questions were answered. The patient knows to call the clinic with any problems, questions or concerns.   Cammie Sickle, MD 02/05/2021 1:11 PM

## 2021-02-05 NOTE — Assessment & Plan Note (Addendum)
#   June 2021 -acute bil pul embolism-unprovoked.  Etiology is unclear.  Stop Xarelto; January 2022 [6 months of anticoagulation].   #June 2021-pneumonia/hilar and mediastinal adenopathy-CT scan November 2021 -stable/improved with gastroenteropathy.  6-7 mm nodule- right lower lobe pulmonary-need surveillance in 6-12 months.   #Elevated PT/INR-PTT-secondary Xarelto [especially given patient's underlying cirrhosis].  Coagulopathy is certainly NOT attributable completely to cirrhosis/liver failure.  # cirrhosis: ? Alcohol- quit since June 2021.  No evidence of decompensation.  #Active smoker-encouraged patient to quit smoking.   # DISPOSITION:  # follow up in 6 months- MD; labs- cbc/cmp/- Dr.B  Addendum: Patient's PT/INR-off anticoagulation is normal. [pt-14;PTT-31- WNL]. will discuss with Dr.Pabone re: post op DVT/PE porphylaxis.

## 2021-02-08 LAB — HM DIABETES EYE EXAM

## 2021-02-12 ENCOUNTER — Telehealth: Payer: Self-pay

## 2021-02-12 NOTE — Telephone Encounter (Signed)
Per-Dr.Brahmanday stop Xarelto -aniticoagulated for 6 months- please see office visit note on 02/05/21 Dr.Brahmanday.

## 2021-02-13 ENCOUNTER — Other Ambulatory Visit: Payer: Medicaid Other

## 2021-02-13 ENCOUNTER — Other Ambulatory Visit: Payer: Self-pay

## 2021-02-13 ENCOUNTER — Other Ambulatory Visit: Payer: Self-pay | Admitting: Gerontology

## 2021-02-13 ENCOUNTER — Telehealth: Payer: Self-pay | Admitting: Internal Medicine

## 2021-02-13 DIAGNOSIS — E1169 Type 2 diabetes mellitus with other specified complication: Secondary | ICD-10-CM

## 2021-02-13 DIAGNOSIS — E1165 Type 2 diabetes mellitus with hyperglycemia: Secondary | ICD-10-CM

## 2021-02-13 DIAGNOSIS — I2699 Other pulmonary embolism without acute cor pulmonale: Secondary | ICD-10-CM

## 2021-02-13 DIAGNOSIS — F1011 Alcohol abuse, in remission: Secondary | ICD-10-CM

## 2021-02-13 DIAGNOSIS — E785 Hyperlipidemia, unspecified: Secondary | ICD-10-CM

## 2021-02-13 MED ORDER — FOLIC ACID 1 MG PO TABS: 1.0000 mg | ORAL_TABLET | Freq: Every day | ORAL | 2 refills | Status: DC

## 2021-02-13 NOTE — Progress Notes (Unsigned)
inr

## 2021-02-13 NOTE — Telephone Encounter (Signed)
On 2/15-discussed with Dr. Dahlia Byes; plan prophylactic dose of Lovenox 40 mg once a day after surgery for 7 days. Discontinue Xarelto. Our office will be informed regarding surgery date to initiate the Lovenox prescription.

## 2021-02-14 ENCOUNTER — Telehealth: Payer: Self-pay

## 2021-02-14 LAB — COMPREHENSIVE METABOLIC PANEL
ALT: 16 IU/L (ref 0–44)
AST: 17 IU/L (ref 0–40)
Albumin/Globulin Ratio: 1.4 (ref 1.2–2.2)
Albumin: 4.6 g/dL (ref 3.8–4.9)
Alkaline Phosphatase: 86 IU/L (ref 44–121)
BUN/Creatinine Ratio: 21 — ABNORMAL HIGH (ref 9–20)
BUN: 18 mg/dL (ref 6–24)
Bilirubin Total: 0.8 mg/dL (ref 0.0–1.2)
CO2: 22 mmol/L (ref 20–29)
Calcium: 9.8 mg/dL (ref 8.7–10.2)
Chloride: 99 mmol/L (ref 96–106)
Creatinine, Ser: 0.87 mg/dL (ref 0.76–1.27)
GFR calc Af Amer: 111 mL/min/{1.73_m2} (ref >59)
GFR calc non Af Amer: 96 mL/min/{1.73_m2} (ref >59)
Globulin, Total: 3.2 g/dL (ref 1.5–4.5)
Glucose: 93 mg/dL (ref 65–99)
Potassium: 4.2 mmol/L (ref 3.5–5.2)
Sodium: 138 mmol/L (ref 134–144)
Total Protein: 7.8 g/dL (ref 6.0–8.5)

## 2021-02-14 LAB — LIPID PANEL
Chol/HDL Ratio: 2.9 ratio (ref 0.0–5.0)
Cholesterol, Total: 192 mg/dL (ref 100–199)
HDL: 67 mg/dL (ref >39)
LDL Chol Calc (NIH): 114 mg/dL — ABNORMAL HIGH (ref 0–99)
Triglycerides: 61 mg/dL (ref 0–149)
VLDL Cholesterol Cal: 11 mg/dL (ref 5–40)

## 2021-02-14 LAB — HEMOGLOBIN A1C
Est. average glucose Bld gHb Est-mCnc: 108 mg/dL
Hgb A1c MFr Bld: 5.4 % (ref 4.8–5.6)

## 2021-02-14 LAB — PROTIME-INR
INR: 1.1 (ref 0.9–1.2)
Prothrombin Time: 11.4 s (ref 9.1–12.0)

## 2021-02-14 NOTE — Telephone Encounter (Signed)
Anticoagulant clearance received from DR.Brahmanday-currently off Xarelto -plan to restart Lovenox 40 mg -surgery -discussed with Dr.Pabon 02/12/2021 per Dr.Brahmanday.

## 2021-02-19 ENCOUNTER — Other Ambulatory Visit: Payer: Self-pay

## 2021-02-19 ENCOUNTER — Ambulatory Visit: Payer: Self-pay | Admitting: Internal Medicine

## 2021-02-19 ENCOUNTER — Other Ambulatory Visit: Payer: Self-pay | Admitting: Gerontology

## 2021-02-19 ENCOUNTER — Telehealth: Payer: Self-pay | Admitting: Pharmacist

## 2021-02-19 DIAGNOSIS — F1011 Alcohol abuse, in remission: Secondary | ICD-10-CM

## 2021-02-19 MED ORDER — FOLIC ACID 1 MG PO TABS: 1.0000 mg | ORAL_TABLET | Freq: Every day | ORAL | 2 refills | Status: DC

## 2021-02-19 NOTE — Telephone Encounter (Signed)
02/19/2021 11:21:46 AM - Delene Loll script to [facility] to sign  -- Elmer Picker - Tuesday, February 19, 2021 11:21 AM -- Interoffice script for Delene Loll to [facility] to sign and return for refill.

## 2021-02-20 ENCOUNTER — Other Ambulatory Visit: Payer: Self-pay

## 2021-02-21 ENCOUNTER — Other Ambulatory Visit: Payer: Self-pay

## 2021-02-26 ENCOUNTER — Other Ambulatory Visit: Payer: Self-pay

## 2021-02-26 ENCOUNTER — Encounter: Payer: Self-pay | Admitting: Gastroenterology

## 2021-02-26 ENCOUNTER — Ambulatory Visit (INDEPENDENT_AMBULATORY_CARE_PROVIDER_SITE_OTHER): Payer: Medicaid Other | Admitting: Gastroenterology

## 2021-02-26 ENCOUNTER — Other Ambulatory Visit: Payer: Self-pay | Admitting: Gerontology

## 2021-02-26 VITALS — BP 116/78 | HR 84 | Temp 97.5°F | Ht 67.0 in | Wt 189.5 lb

## 2021-02-26 DIAGNOSIS — K7031 Alcoholic cirrhosis of liver with ascites: Secondary | ICD-10-CM

## 2021-02-26 DIAGNOSIS — K4090 Unilateral inguinal hernia, without obstruction or gangrene, not specified as recurrent: Secondary | ICD-10-CM

## 2021-02-26 DIAGNOSIS — K2950 Unspecified chronic gastritis without bleeding: Secondary | ICD-10-CM

## 2021-02-26 DIAGNOSIS — Z23 Encounter for immunization: Secondary | ICD-10-CM

## 2021-02-26 NOTE — Progress Notes (Signed)
[name], MD [address]  [place]  [place], [place] [address]  Main: [phone]  Fax: [phone]    Gastroenterology Consultation  Referring Provider:     Langston Reusing, NP Primary Care Physician:  Langston Reusing, NP Primary Gastroenterologist:  Dr. [name] Reason for Consultation:     Cirrhosis of liver        HPI:   Collin Byrd is a 60 y.o. male referred by Dr. Langston Reusing, NP  for consultation & management of cirrhosis of liver.  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  He also has history of pneumonia and pulmonary embolism, is currently on anticoagulation.  Patient is followed by hematologist, Dr. Rogue Bussing.  Patient underwent therapeutic paracentesis in June 2021, was diagnosed with SBP as well as fluid analysis consistent with portal hypertension.  He was treated with antibiotics and was discharged home on Lasix as well as spironolactone.  Patient stopped drinking alcohol since June and is adherent to his medications.  He reports doing well since then.  He denies swelling of abdomen, distention of abdomen, swelling of legs.  He denies black stools, rectal bleeding, hematemesis or abdominal pain.  His most recent labs revealed mild normocytic anemia, normal LFTs and renal function.  He is currently on oral iron, thiamine and folate. He is on disability, currently not working  CT chest 11/01/2020 revealed resolution of prior pulmonary embolus  Follow-up visit 02/26/2021 Patient is doing well from cirrhosis standpoint.  He does not have swelling of legs or abdominal distention.  He is taking spironolactone 100 g daily.  He underwent upper endoscopy which revealed mild gastropathy only.  Gastric biopsies also revealed chronic active gastritis.  Colonoscopy was incomplete due to presence of large left inguinal hernia.  Patient is referred to general surgery, has appointment with Dr.  Dahlia Byes on 3/9  NSAIDs: None  Antiplts/Anticoagulants/Anti thrombotics: Xarelto for history of PE  GI Procedures:  - Normal duodenal bulb and second portion of the duodenum. - Portal hypertensive gastropathy. Biopsied. - A single gastric polyp. Biopsied. Clip (MR conditional) was placed. - Small (< 5 mm) esophageal varices. - Esophagogastric landmarks identified. - Normal proximal esophagus, mid esophagus and gastroesophageal junction.  Procedure aborted due to large left inguinal hernia, advanced to transverse colon only The perianal and digital rectal examinations were normal. Pertinent negatives include normal sphincter tone and no palpable rectal lesions. Two sessile polyps were found in the descending colon and transverse colon. The polyps were 3 to 5 mm in size. These polyps were removed with a cold snare. Resection and retrieval were complete. Estimated blood loss: none. Non-bleeding external and internal hemorrhoids were found during retroflexion. The hemorrhoids were large.  DIAGNOSIS:  A. STOMACH; COLD BIOPSY:  - MODERATE CHRONIC ACTIVE GASTRITIS.  - ANTRAL MUCOSA WITH FOCAL INTESTINAL METAPLASIA.  - SIDEROSIS.  - SEE COMMENT.  - NEGATIVE FOR H.PYLORI, DYSPLASIA AND MALIGNANCY.   B. STOMACH POLYPS; COLD BIOPSY:  - INFLAMED POLYPOID FRAGMENT OF MUCOSA WITH REACTIVE FOVEOLAR  HYPERPLASIA.  - NEGATIVE FOR H.PYLORI, CMV, DYSPLASIA AND MALIGNANCY.   C. COLON POLYP X3, DESCENDING; COLD SNARE:  - TUBULAR ADENOMA (MULTIPLE FRAGMENTS).  - NEGATIVE FOR HIGH-GRADE DYSPLASIA AND MALIGNANCY.   He denies family history of GI malignancy, liver cancer  Past Medical History:  Diagnosis Date  . Cataract   . CHF (congestive heart failure) ([place])   . Cirrhosis ([place])   . Diabetes mellitus without  complication ([place])   . Pulmonary embolus [facility])     Past Surgical History:  Procedure Laterality Date  . CATARACT EXTRACTION    . COLONOSCOPY WITH PROPOFOL N/A 01/03/2021   Procedure:  COLONOSCOPY WITH PROPOFOL;  Surgeon: Lin Landsman, MD;  Location: [facility] ENDOSCOPY;  Service: Gastroenterology;  Laterality: N/A;  . ESOPHAGOGASTRODUODENOSCOPY (EGD) WITH PROPOFOL N/A 01/03/2021   Procedure: ESOPHAGOGASTRODUODENOSCOPY (EGD) WITH PROPOFOL;  Surgeon: Lin Landsman, MD;  Location: [facility] ENDOSCOPY;  Service: Gastroenterology;  Laterality: N/A;    Current Outpatient Medications:  .  artificial tears (LACRILUBE) OINT ophthalmic ointment, Place into the right eye 3 (three) times daily., Disp: 1 g, Rfl: 0 .  blood glucose meter kit and supplies KIT, Dispense based on patient and insurance preference. Use up to four times daily as directed. (FOR ICD-9 250.00, 250.01)., Disp: 1 each, Rfl: 0 .  folic acid (FOLVITE) 1 MG tablet, Take 1 tablet (1 mg total) by mouth daily., Disp: 30 tablet, Rfl: 2 .  furosemide (LASIX) 40 MG tablet, Take 1 tablet (40 mg total) by mouth daily., Disp: 90 tablet, Rfl: 3 .  Omega-3 Fatty Acids (FISH OIL) 1000 MG CAPS, Take 2 capsules (2,000 mg total) by mouth in the morning and at bedtime., Disp: 60 capsule, Rfl: 2 .  pantoprazole (PROTONIX) 40 MG tablet, TAKE ONE TABLET BY MOUTH EVERY DAY, Disp: 90 tablet, Rfl: 0 .  sacubitril-valsartan (ENTRESTO) 24-26 MG, Take 1 tablet by mouth 2 (two) times daily., Disp: , Rfl:  .  spironolactone (ALDACTONE) 100 MG tablet, TAKE ONE TABLET BY MOUTH 2 TIMES A DAY., Disp: 180 tablet, Rfl: 3 .  thiamine (VITAMIN B-1) 100 MG tablet, TAKE ONE TABLET BY MOUTH EVERY DAY, Disp: 30 tablet, Rfl: 2  History reviewed. No pertinent family history.   Social History   Tobacco Use  . Smoking status: Former Smoker    Packs/day: 0.25    Types: Cigarettes  . Smokeless tobacco: Never Used  Vaping Use  . Vaping Use: Never used  Substance Use Topics  . Alcohol use: Not Currently  . Drug use: Never    Allergies as of 02/26/2021  . (No Known Allergies)    Review of Systems:    All systems reviewed and negative except where  noted in HPI.   Physical Exam:  BP 116/78 (BP Location: Left Arm, Patient Position: Sitting, Cuff Size: Normal)   Pulse 84   Temp (!) 97.5 F (36.4 C) (Oral)   Ht 5' 7"  (1.702 m)   Wt 189 lb 8 oz (86 kg)   BMI 29.68 kg/m  No LMP for male patient.  General:   Alert,  Well-developed, well-nourished, pleasant and cooperative in NAD Head:  Normocephalic and atraumatic. Eyes:  Sclera clear, no icterus.   Conjunctiva pink.  Congested right eye Ears:  Normal auditory acuity. Nose:  No deformity, discharge, or lesions. Mouth:  No deformity or lesions,oropharynx pink & moist. Neck:  Supple; no masses or thyromegaly. Lungs:  Respirations even and unlabored.  Clear throughout to auscultation.   No wheezes, crackles, or rhonchi. No acute distress. Heart:  Regular rate and rhythm; no murmurs, clicks, rubs, or gallops. Abdomen:  Normal bowel sounds. Soft, non-tender and non-distended without masses, hepatosplenomegaly, large left inguinal hernia noted.  No guarding or rebound tenderness.   Rectal: Not performed Msk:  Symmetrical without gross deformities. Good, equal movement & strength bilaterally. Pulses:  Normal pulses noted. Extremities:  No clubbing or edema.  No cyanosis. Neurologic:  Alert and  oriented x3;  grossly normal neurologically. Skin:  Intact without significant lesions or rashes. No jaundice. Psych:  Alert and cooperative. Normal mood and affect.  Imaging Studies: Reviewed  Assessment and Plan:   Collin Byrd is a 60 y.o. Hispanic male with diabetes, decompensated alcoholic cirrhosis with ascites, history of SBP, pneumonia and pulmonary embolism on Xarelto is seen in consultation for cirrhosis  Alcoholic cirrhosis of liver, child Pugh class B, low meld Viral hepatitis panel negative, recommend to start Twinrix vaccine, first dose today No evidence of anemia or coagulopathy, no evidence of thrombocytopenia Ascites: History of SBP, s/p therapeutic paracentesis in 6/21,  consistent with portal hypertension Continue spironolactone 100 mg daily.  Patient is currently euvolemic Continue low-sodium diet No evidence of esophageal or gastric varices HCC screening: AFP levels normal and CT did not reveal liver lesions in 6/21.  Recommend right upper quadrant ultrasound in 6/22 PSE: None Continue to remain abstinent from alcohol use  Colon cancer screening Incomplete colonoscopy due to presence of large left inguinal hernia Perform colonoscopy after repair of the hernia in 6 months  Moderate chronic active gastritis Check H. pylori IgG given his ethnicity and treat if positive even though gastric biopsies did not reveal H. pylori infection   Follow up in 6 months   [name], MD

## 2021-02-26 NOTE — Addendum Note (Signed)
Addended by: Sherri Sear R on: 02/26/2021 11:45 AM   Modules accepted: Level of Service

## 2021-02-27 ENCOUNTER — Ambulatory Visit: Payer: Medicaid Other | Admitting: Gerontology

## 2021-02-27 VITALS — BP 97/60 | HR 82 | Temp 98.4°F | Resp 16 | Wt 191.2 lb

## 2021-02-27 DIAGNOSIS — E1165 Type 2 diabetes mellitus with hyperglycemia: Secondary | ICD-10-CM

## 2021-02-27 LAB — H. PYLORI ANTIBODY, IGG: H. pylori, IgG AbS: 5.03 Index Value — ABNORMAL HIGH (ref 0.00–0.79)

## 2021-02-27 NOTE — Progress Notes (Signed)
Established Patient Office Visit  Subjective:  Patient ID: Collin Byrd, male    DOB: 10-30-1961  Age: 60 y.o. MRN: [id number]  CC: No chief complaint on file.   HPI Collin Byrd 60 y/o male with history of Cirrhosis of the liver due to alcohol abuse, CHF  And T2DM presents for routine follow up. He states that he's compliant with his medication and continues to make healthy lifestyle changes. He LDL done on 02/13/21 was 114 mg/dl , his HgbA1c was 5.4% and his glymepiride was discontinued. He will follow up with Dr Dahlia Byes with regards to his left inguinal hernia. His Xarelto was discontinued by Dr Rogue Bussing on 02/05/21. He was also seen on 02/26/21 by Gastroenterologist Dr Marius Ditch, his colonoscopy was incomplete due to the presence of large left inguinal hernia and he will follow up in 6 months. Overall, he states that he's doing well and offers no further complaint.  Past Medical History:  Diagnosis Date  . Cataract   . CHF (congestive heart failure) ([place])   . Cirrhosis ([place])   . Diabetes mellitus without complication ([place])   . Pulmonary embolus [facility])     Past Surgical History:  Procedure Laterality Date  . CATARACT EXTRACTION    . COLONOSCOPY WITH PROPOFOL N/A 01/03/2021   Procedure: COLONOSCOPY WITH PROPOFOL;  Surgeon: Lin Landsman, MD;  Location: [facility] ENDOSCOPY;  Service: Gastroenterology;  Laterality: N/A;  . ESOPHAGOGASTRODUODENOSCOPY (EGD) WITH PROPOFOL N/A 01/03/2021   Procedure: ESOPHAGOGASTRODUODENOSCOPY (EGD) WITH PROPOFOL;  Surgeon: Lin Landsman, MD;  Location: [facility] ENDOSCOPY;  Service: Gastroenterology;  Laterality: N/A;    No family history on file.  Social History   Socioeconomic History  . Marital status: Married    Spouse name: Not on file  . Number of children: Not on file  . Years of education: Not on file  . Highest education level: Not on file  Occupational History  . Not on file  Tobacco Use  . Smoking status: Former Smoker    Packs/day:  0.25    Types: Cigarettes  . Smokeless tobacco: Never Used  Vaping Use  . Vaping Use: Never used  Substance and Sexual Activity  . Alcohol use: Not Currently  . Drug use: Never  . Sexual activity: Not Currently  Other Topics Concern  . 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 Smoking- 1-3 cig/day. Lives in [place]; with wife. No children.    Social Determinants of Health   Financial Resource Strain: Not on file  Food Insecurity: Not on file  Transportation Needs: Not on file  Physical Activity: Not on file  Stress: Not on file  Social Connections: Not on file  Intimate Partner Violence: Not on file    Outpatient Medications Prior to Visit  Medication Sig Dispense Refill  . artificial tears (LACRILUBE) OINT ophthalmic ointment Place into the right eye 3 (three) times daily. 1 g 0  . folic acid (FOLVITE) 1 MG tablet Take 1 tablet (1 mg total) by mouth daily. 30 tablet 2  . furosemide (LASIX) 40 MG tablet Take 1 tablet (40 mg total) by mouth daily. 90 tablet 3  . omega-3 acid ethyl esters (LOVAZA) 1 g capsule TAKE 2 CAPSULES BY MOUTH IN THE MORNING AND AT BEDTIME 120 capsule 0  . pantoprazole (PROTONIX) 40 MG tablet TAKE ONE TABLET BY MOUTH EVERY DAY 90 tablet 0  . sacubitril-valsartan (ENTRESTO) 24-26 MG Take 1 tablet by  mouth 2 (two) times daily.    Marland Kitchen spironolactone (ALDACTONE) 100 MG tablet TAKE ONE TABLET BY MOUTH 2 TIMES A DAY. 180 tablet 3  . thiamine (VITAMIN B-1) 100 MG tablet TAKE ONE TABLET BY MOUTH EVERY DAY 30 tablet 2  . blood glucose meter kit and supplies KIT Dispense based on patient and insurance preference. Use up to four times daily as directed. (FOR ICD-9 250.00, 250.01). 1 each 0   No facility-administered medications prior to visit.    No Known Allergies  ROS Review of Systems  Constitutional: Negative.   HENT: Negative.   Eyes: Negative.   Respiratory: Negative.    Cardiovascular: Negative.   Endocrine: Negative.   Genitourinary: Negative.   Skin: Negative.   Neurological: Negative.       Objective:    Physical Exam HENT:     Head: Normocephalic and atraumatic.  Cardiovascular:     Rate and Rhythm: Normal rate and regular rhythm.     Pulses: Normal pulses.     Heart sounds: Normal heart sounds.  Pulmonary:     Effort: Pulmonary effort is normal.     Breath sounds: Normal breath sounds.  Abdominal:     General: Abdomen is flat. Bowel sounds are normal.     Palpations: Abdomen is soft.  Skin:    General: Skin is warm.  Neurological:     General: No focal deficit present.     Mental Status: He is alert and oriented to person, place, and time. Mental status is at baseline.  Psychiatric:        Mood and Affect: Mood normal.        Behavior: Behavior normal.        Thought Content: Thought content normal.        Judgment: Judgment normal.     BP 97/60 (BP Location: Left Arm, Patient Position: Sitting, Cuff Size: Normal)   Pulse 82   Temp 98.4 F (36.9 C)   Resp 16   Wt 191 lb 3.2 oz (86.7 kg)   SpO2 96%   BMI 29.95 kg/m  Wt Readings from Last 3 Encounters:  02/27/21 191 lb 3.2 oz (86.7 kg)  02/26/21 189 lb 8 oz (86 kg)  02/13/21 186 lb (84.4 kg)     Health Maintenance Due  Topic Date Due  . COVID-19 Vaccine (1) Never done  . TETANUS/TDAP  Never done    There are no preventive care reminders to display for this patient.  Lab Results  Component Value Date   TSH 0.773 06/05/2020   Lab Results  Component Value Date   WBC 7.6 02/05/2021   HGB 13.8 02/05/2021   HCT 40.3 02/05/2021   MCV 92.6 02/05/2021   PLT 227 02/05/2021   Lab Results  Component Value Date   NA 138 02/13/2021   K 4.2 02/13/2021   CO2 22 02/13/2021   GLUCOSE 93 02/13/2021   BUN 18 02/13/2021   CREATININE 0.87 02/13/2021   BILITOT 0.8 02/13/2021   ALKPHOS 86 02/13/2021   AST 17 02/13/2021   ALT 16 02/13/2021   PROT 7.8 02/13/2021    ALBUMIN 4.6 02/13/2021   CALCIUM 9.8 02/13/2021   ANIONGAP 11 02/05/2021   Lab Results  Component Value Date   CHOL 192 02/13/2021   Lab Results  Component Value Date   HDL 67 02/13/2021   Lab Results  Component Value Date   LDLCALC 114 (H) 02/13/2021   Lab Results  Component Value Date  TRIG 61 02/13/2021   Lab Results  Component Value Date   CHOLHDL 2.9 02/13/2021   Lab Results  Component Value Date   HGBA1C 5.4 02/13/2021      Assessment & Plan:    1. Type 2 diabetes mellitus with hyperglycemia, without long-term current use of insulin (HCC) - His HgbA1c was 5.4%, his glimepiride was discontinued, and was advised to check blood glucose bi weekly and notify clinic if his fasting readings are consistently greater than 130 mg/dl. He was encouraged to continue on low crab/ non concentrated sweet diet.   Follow-up: Return in about 4 months (around 06/27/2021).    Oyinkansola Truax Jerold Coombe, NP

## 2021-02-27 NOTE — Patient Instructions (Signed)

## 2021-03-01 ENCOUNTER — Telehealth: Payer: Self-pay

## 2021-03-01 ENCOUNTER — Other Ambulatory Visit: Payer: Self-pay | Admitting: Gastroenterology

## 2021-03-01 MED ORDER — CLARITHROMYCIN 500 MG PO TABS: 500.0000 mg | ORAL_TABLET | Freq: Two times a day (BID) | ORAL | 0 refills | Status: DC

## 2021-03-01 MED ORDER — AMOXICILLIN 500 MG PO TABS: 1000.0000 mg | ORAL_TABLET | Freq: Two times a day (BID) | ORAL | 0 refills | Status: DC

## 2021-03-01 MED ORDER — OMEPRAZOLE 20 MG PO CPDR: 20.0000 mg | DELAYED_RELEASE_CAPSULE | Freq: Two times a day (BID) | ORAL | 0 refills | Status: DC

## 2021-03-01 NOTE — Telephone Encounter (Signed)
Sent medication to the pharmacy. Called patient and left a message for call back

## 2021-03-01 NOTE — Telephone Encounter (Signed)
-----   Message from Lin Landsman, MD sent at 02/28/2021  5:03 PM EST ----- The blood test for H. pylori infection came back positive.  Recommend triple therapy for 14 days  Omeprazole 20mg  BID Clarithromycin 500mg  BID Amoxicillin 1gm BID  He should stop the pantoprazole/Protonix when he takes omeprazole  Thanks RV

## 2021-03-04 NOTE — Telephone Encounter (Signed)
Patient verbalized understanding and will pick up the medications

## 2021-03-05 ENCOUNTER — Other Ambulatory Visit: Payer: Self-pay | Admitting: Gerontology

## 2021-03-05 ENCOUNTER — Other Ambulatory Visit: Payer: Self-pay

## 2021-03-06 ENCOUNTER — Other Ambulatory Visit: Payer: Self-pay

## 2021-03-06 ENCOUNTER — Ambulatory Visit (INDEPENDENT_AMBULATORY_CARE_PROVIDER_SITE_OTHER): Payer: Medicaid Other | Admitting: Surgery

## 2021-03-06 ENCOUNTER — Other Ambulatory Visit: Payer: Self-pay | Admitting: Family

## 2021-03-06 ENCOUNTER — Encounter: Payer: Self-pay | Admitting: Surgery

## 2021-03-06 VITALS — BP 108/70 | HR 88 | Temp 97.8°F | Ht 67.0 in | Wt 189.4 lb

## 2021-03-06 DIAGNOSIS — K4030 Unilateral inguinal hernia, with obstruction, without gangrene, not specified as recurrent: Secondary | ICD-10-CM

## 2021-03-06 MED ORDER — LOSARTAN POTASSIUM 25 MG PO TABS: 25.0000 mg | ORAL_TABLET | Freq: Every day | ORAL | 3 refills | Status: DC

## 2021-03-06 NOTE — Progress Notes (Signed)
Received message from pharmacy tech at [facility] stating that there's a delay in patient getting his entresto and he will be out soon.   Advised that we could bridge him with losartan 25mg  daily until his entresto shipment arrives. [facility].

## 2021-03-06 NOTE — Patient Instructions (Addendum)
Our surgery scheduler will call you within 24-48 hours to schedule your surgery, Please have the Blue surgery sheet available when speaking with her. Please call with any questions or concerns.  Please pick up your medication at  [facility]  [address] [place] [address] [phone]    Hernia inguinal en los adultos Inguinal Hernia, Adult Una hernia inguinal se produce cuando la grasa o los intestinos empujan a travs de una zona dbil de un msculo donde se unen la pierna y el abdomen inferior (ingle). Esto produce un bulto. Este tipo de hernia tambin puede aparecer en:  En el escroto, si es varn.  En los pliegues de la piel alrededor de la vagina, si es [place]. Existen tres tipos de hernias inguinales:  Hernias que se pueden empujar hacia adentro del abdomen (son reducibles). Este tipo de hernias rara vez provoca dolor.  Hernias que no se pueden empujar hacia adentro del abdomen (encarceladas).  Hernias que no se pueden empujar hacia adentro del abdomen y pierden la irrigacin sangunea (estranguladas). Este tipo de hernia requiere ciruga de Freight forwarder. Cules son las causas? Esta afeccin se produce cuando tiene una zona dbil en los msculos o en los tejidos de la ingle. Esto se desarrolla con el tiempo. La hernia puede salirse por la zona dbil cuando ejerce un esfuerzo [employer] de la parte baja del vientre de [place] repentina, por ejemplo, cuando usted:  Levanta un objeto pesado.  Hace esfuerzos para mover el vientre (defecar). La dificultad para mover el vientre (estreimiento) puede derivar en un esfuerzo.  Tos. Qu incrementa el riesgo? Es ms probable que Orthoptist en:  Los hombres.  [place].  Personas que: ? Tienen sobrepeso. ? Realizan trabajos que requieren Haematologist de pie durante largos perodos o levantar cargas pesadas. ? Han tenido una hernia inguinal previamente. ? Fuman o tienen una  enfermedad pulmonar. Estos factores pueden causar tos a largo plazo (crnica). Cules son los signos o sntomas? Los sntomas pueden depender del tamao de la hernia. Con frecuencia, una pequea hernia no tiene sntomas. Estos son algunos de los sntomas de una hernia ms grande:  Un bulto en la zona de la ingle. Este es fcil de ver cuando se encuentra de pie. Es posible que no pueda verlo cuando est recostado.  Dolor o ardor en la ingle. Esto podra empeorar cuando levanta un objeto, realiza un esfuerzo o tose.  Un dolor sordo o una sensacin de presin en la ingle.  Un bulto anormal en el escroto, en los hombres. Los sntomas de una hernia inguinal estrangulada pueden incluir lo siguiente:  Un bulto en la ingle que es muy doloroso y sensible al tacto.  Un bulto que se torna de color rojo o prpura.  Fiebre, sensacin de que va a vomitar (nuseas) y vmitos.  No poder mover el vientre ni expulsar gases. Cmo se trata? El tratamiento depende del tamao de la hernia y de si tiene sntomas o no. Si no tiene sntomas, el mdico podr indicarle que controle cuidadosamente la hernia y que concurra a las visitas de seguimiento. Si tiene sntomas o una hernia grande, es posible que necesite ciruga para repararla. Siga estas instrucciones en su casa: Estilo de vida  Evite levantar objetos pesados.  Evite estar de pie durante mucho tiempo.  No fume ni consuma ningn producto que contenga nicotina o tabaco. Si necesita ayuda para dejar de fumar, consulte al mdico.  Mantenga un peso saludable. Evite la  dificultad para defecar Es posible que deba tomar estas medidas para prevenir o tratar los problemas para defecar:  Electronics engineer suficiente lquido para Contractor pis (orina) de color amarillo plido.  Usar medicamentos recetados o de Radio broadcast assistant.  Comer alimentos ricos en fibra. Entre ellos, frijoles, cereales integrales y frutas y verduras frescas.  Limitar los alimentos con alto  contenido de grasa y Location manager. Estos incluyen alimentos fritos o dulces. Instrucciones generales  Puede intentar empujar la hernia hacia adentro presionando muy suavemente sobre esta cuando est acostado. No intente empujar el bulto hacia adentro si este no entra fcilmente.  Observe si la hernia cambia de forma, de color o de tamao. Informe al mdico si observa algn cambio.  Use los medicamentos de venta libre y los recetados solamente como se lo haya indicado el mdico.  Cumpla con todas las visitas de seguimiento. Comunquese con un mdico si:  Tiene fiebre o escalofros.  Aparecen nuevos sntomas.  Sus sntomas empeoran. Solicite ayuda de inmediato si:  Tiene dolor en la ingle que empeora de repente.  Tiene un bulto en la ingle que tiene las siguientes caractersticas: ? Se agranda de repente y no se encoge despus. ? Se pone rojo o morado. ? Le duele al tocarlo.  Usted es hombre y tiene: ? Dolor repentino en el escroto. ? Un cambio repentino en el tamao del escroto.  No puede volver a Public affairs consultant hernia en su lugar al ejercer sobre esta una presin muy suave mientras est acostado.  Siente ganas de vomitar y la sensacin no desaparece.  Sigue vomitando.  Tiene latidos cardacos acelerados.  No puede mover el vientre o expulsar gases. Estos sntomas pueden Sales executive. Solicite ayuda de inmediato. Comunquese con el servicio de emergencias de su localidad (911 en los [place]).  No espere a ver si los sntomas desaparecen.  No conduzca por sus propios medios [employer] hospital. Resumen  Una hernia inguinal se produce cuando la grasa o los intestinos empujan a travs de una zona dbil de un msculo donde se unen la pierna y el abdomen inferior (ingle). Esto produce un bulto.  Si no tiene sntomas, es posible que no necesite tratamiento. Si tiene sntomas o una hernia grande, es posible que necesite [place].  Evite levantar objetos pesados. Tambin  evite estar de pie durante mucho tiempo.  No intente empujar el bulto hacia adentro si este no entra fcilmente. Esta informacin no tiene Marine scientist el consejo del mdico. Asegrese de hacerle al mdico cualquier pregunta que tenga. Document Revised: 10/15/2020 Document Reviewed: 08/31/2020 Elsevier Patient Education  2021 [employer].

## 2021-03-08 ENCOUNTER — Encounter: Payer: Self-pay | Admitting: Surgery

## 2021-03-08 NOTE — Progress Notes (Signed)
Outpatient Surgical Follow Up  03/08/2021  Collin Byrd is an 60 y.o. male.   Chief Complaint  Patient presents with  . Follow-up    Right inguinal hernia    HPI:  Collin Byrd is a 60 year old male well-known to me with a history of cirrhosis [name] a but this has been confounding given that his INR was close to 3.  Patient was anticoagulated for a PE.  I have discussed the case in detail with Dr. Rogue Bussing from hematology and we have decided on holding Xarelto at this time.   After he was removed from his anticoagulation his INR has normalized.  Oncology has recommended not to initiate anticoagulation but after his surgery.  .  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  Last time his CMP was pretty normal except mild elevation of bilirubin to 1.4 but his INR was 2.9.   He continues to have intermittent left inguinal  pAIN that is mild to moderate in nature.  No evidence of obstruction.  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 He is on both spironolactone and Lasix. No encephalopathy.  No evidence of liver decompensation. Based on his latest labs to include CBC and CMP he is Childs A.  Both portal hypertension and synthetic liver function is well under control  Past Medical History:  Diagnosis Date  . Cataract   . CHF (congestive heart failure) ([place])   . Cirrhosis ([place])   . Diabetes mellitus without complication ([place])   . Pulmonary embolus [facility])     Past Surgical History:  Procedure Laterality Date  . CATARACT EXTRACTION    . COLONOSCOPY WITH PROPOFOL N/A 01/03/2021   Procedure: COLONOSCOPY WITH PROPOFOL;  Surgeon: Lin Landsman, MD;  Location: [facility] ENDOSCOPY;  Service: Gastroenterology;  Laterality: N/A;  . ESOPHAGOGASTRODUODENOSCOPY (EGD) WITH PROPOFOL N/A 01/03/2021   Procedure: ESOPHAGOGASTRODUODENOSCOPY (EGD) WITH PROPOFOL;  Surgeon: Lin Landsman, MD;  Location: [facility] ENDOSCOPY;  Service: Gastroenterology;  Laterality: N/A;    History reviewed. No pertinent family history.  Social History:  reports that he has quit smoking. His smoking use included cigarettes. He smoked 0.25 packs per day. He has never used smokeless tobacco. He reports previous alcohol use. He reports that he does not use drugs.  Allergies: No Known Allergies  Medications reviewed.    ROS Full ROS performed and is otherwise negative other than what is stated in HPI   BP 108/70   Pulse 88   Temp 97.8 F (36.6 C) (Oral)   Ht 5\' 7"  (1.702 m)   Wt 189 lb 6.4 oz (85.9 kg)   SpO2 98%   BMI 29.66 kg/m   Physical Exam  Physical Exam CONSTITUTIONAL:NAD EYES: Pupils are equal, round, Sclera are non-icteric. EARS, NOSE, MOUTH AND THROAT:HE is wearing a mask.Hearing is intact to voice. LYMPH NODES: Lymph nodes in the neck are normal. RESPIRATORY: Lungs are clear. There is normal respiratory effort, with equal breath sounds bilaterally, and without pathologic use of accessory muscles. CARDIOVASCULAR: Heart is regular without murmurs, gallops, or rubs. GI: The abdomen is soft, nontender, and nondistended. There are no palpable masses. There is no hepatosplenomegaly. There are normal bowel sounds in all quadrants. There is reducible umbilical hernia and chronically incarcerated Giant Left inguinal hernia, no peritonitis. GU: Rectal deferred.  MUSCULOSKELETAL: Normal muscle strength and tone. No cyanosis or edema.  SKIN: Turgor is good and  there are no pathologic skin lesions or ulcers. NEUROLOGIC: Motor and sensation is grossly normal. Cranial nerves are grossly intact. PSYCH: Oriented to person, place and time. Affect is normal.      Assessment/Plan: 60 year old male with chronic [name] incarcerated left inguinal hernia on a patient with cirrhosis that is currently compensated and without ascites.  He has been optimized from a liver  perspective.  Discussed with patient in detail about his disease process.  Now that he is optimized I do think that is reasonable to fix his hernia.  Procedure discussed with the patient in detail.  Risks, benefits and possible applications discussed with patient in detail.  Discussed bleeding, flexion, injuries to the bowel and bladder, colon injuries given the fact that he has a chronically incarcerated bowel within the left inguinal region.  Recurrence and inability to control the ascites as well as decompensation of his liver was discussed at length with the patient.  I have also discussed with Dr. Rogue Bussing about perioperative anticoagulation given the fact that he has a history of PE.  We will also address the umbilical hernia at the same time Again complex is an area with no great solutions.  On the other hand if we do not fix the hernia I am afraid that his bowel might incarcerated and then it may turn into emergent scenario that will significantly and exponentially increase his perioperative morbidity and mortality.   Greater than 50% of the 45 minutes  visit was spent in counseling/coordination of care   Caroleen Hamman, MD [place] Surgeon

## 2021-03-08 NOTE — H&P (View-Only) (Signed)
Outpatient Surgical Follow Up  03/08/2021  Collin Byrd is an 59 y.o. male.   Chief Complaint  Patient presents with  . Follow-up    Right inguinal hernia    HPI:  Collin Byrd is a 59-year-old male well-known to me with a history of cirrhosis Childs a but this has been confounding given that his INR was close to 3.  Patient was anticoagulated for a PE.  I have discussed the case in detail with Dr. Brahmanday from hematology and we have decided on holding Xarelto at this time.   After he was removed from his anticoagulation his INR has normalized.  Oncology has recommended not to initiate anticoagulation but after his surgery.  .  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  Last time his CMP was pretty normal except mild elevation of bilirubin to 1.4 but his INR was 2.9.   He continues to have intermittent left inguinal  pAIN that is mild to moderate in nature.  No evidence of obstruction.  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 He is on both spironolactone and Lasix. No encephalopathy.  No evidence of liver decompensation. Based on his latest labs to include CBC and CMP he is Childs A.  Both portal hypertension and synthetic liver function is well under control  Past Medical History:  Diagnosis Date  . Cataract   . CHF (congestive heart failure) ([facility])   . Cirrhosis ([facility])   . Diabetes mellitus without complication ([facility])   . Pulmonary embolus ([facility])     Past Surgical History:  Procedure Laterality Date  . CATARACT EXTRACTION    . COLONOSCOPY WITH PROPOFOL N/A 01/03/2021   Procedure: COLONOSCOPY WITH PROPOFOL;  Surgeon: Vanga, Rohini Reddy, MD;  Location: [facility] ENDOSCOPY;  Service: Gastroenterology;  Laterality: N/A;  . ESOPHAGOGASTRODUODENOSCOPY (EGD) WITH PROPOFOL N/A 01/03/2021   Procedure: ESOPHAGOGASTRODUODENOSCOPY (EGD) WITH PROPOFOL;  Surgeon: Vanga,  Rohini Reddy, MD;  Location: [facility] ENDOSCOPY;  Service: Gastroenterology;  Laterality: N/A;    History reviewed. No pertinent family history.  Social History:  reports that he has quit smoking. His smoking use included cigarettes. He smoked 0.25 packs per day. He has never used smokeless tobacco. He reports previous alcohol use. He reports that he does not use drugs.  Allergies: No Known Allergies  Medications reviewed.    ROS Full ROS performed and is otherwise negative other than what is stated in HPI   BP 108/70   Pulse 88   Temp 97.8 F (36.6 C) (Oral)   Ht 5' 7" (1.702 m)   Wt 189 lb 6.4 oz (85.9 kg)   SpO2 98%   BMI 29.66 kg/m   Physical Exam  Physical Exam CONSTITUTIONAL:NAD EYES: Pupils are equal, round, Sclera are non-icteric. EARS, NOSE, MOUTH AND THROAT:HE is wearing a mask.Hearing is intact to voice. LYMPH NODES: Lymph nodes in the neck are normal. RESPIRATORY: Lungs are clear. There is normal respiratory effort, with equal breath sounds bilaterally, and without pathologic use of accessory muscles. CARDIOVASCULAR: Heart is regular without murmurs, gallops, or rubs. GI: The abdomen is soft, nontender, and nondistended. There are no palpable masses. There is no hepatosplenomegaly. There are normal bowel sounds in all quadrants. There is reducible umbilical hernia and chronically incarcerated Giant Left inguinal hernia, no peritonitis. GU: Rectal deferred.  MUSCULOSKELETAL: Normal muscle strength and tone. No cyanosis or edema.  SKIN: Turgor is good and   there are no pathologic skin lesions or ulcers. NEUROLOGIC: Motor and sensation is grossly normal. Cranial nerves are grossly intact. PSYCH: Oriented to person, place and time. Affect is normal.      Assessment/Plan: 60 year old male with chronic Collin Byrd incarcerated left inguinal hernia on a patient with cirrhosis that is currently compensated and without ascites.  He has been optimized from a liver  perspective.  Discussed with patient in detail about his disease process.  Now that he is optimized I do think that is reasonable to fix his hernia.  Procedure discussed with the patient in detail.  Risks, benefits and possible applications discussed with patient in detail.  Discussed bleeding, flexion, injuries to the bowel and bladder, colon injuries given the fact that he has a chronically incarcerated bowel within the left inguinal region.  Recurrence and inability to control the ascites as well as decompensation of his liver was discussed at length with the patient.  I have also discussed with Dr. Rogue Bussing about perioperative anticoagulation given the fact that he has a history of PE.  We will also address the umbilical hernia at the same time Again complex is an area with no great solutions.  On the other hand if we do not fix the hernia I am afraid that his bowel might incarcerated and then it may turn into emergent scenario that will significantly and exponentially increase his perioperative morbidity and mortality.   Greater than 50% of the 45 minutes  visit was spent in counseling/coordination of care   Caroleen Hamman, MD [place] Surgeon

## 2021-03-11 ENCOUNTER — Telehealth: Payer: Self-pay | Admitting: Surgery

## 2021-03-11 NOTE — Telephone Encounter (Signed)
Outgoing call is made, unable to leave message.  Please advise patient of Pre-Admission date/time, COVID Testing date and Surgery date.  Surgery Date: 04/04/21 Preadmission Testing Date: 03/28/21 (phone 8a-1p) Covid Testing Date: 04/02/21 in person at 8:40 am, patient advised to go to the [place] [facility] [facility]  Also patient to call at [phone], between 1-3:00pm the day before surgery, to find out what time to arrive for surgery.

## 2021-03-12 NOTE — Telephone Encounter (Signed)
Outgoing call is made again, this time was able to speak with patient and his partner.  They both are aware of all dates regarding his surgery.  Not quite sure if they completely understood everything as it seemed they were writing down information incorrectly.  I have gone over the information several times with them via phone, this is also mailed to them.

## 2021-03-22 ENCOUNTER — Other Ambulatory Visit: Payer: Self-pay | Admitting: Internal Medicine

## 2021-03-22 MED ORDER — ENOXAPARIN SODIUM 40 MG/0.4ML ~~LOC~~ SOLN: 40.0000 mg | SUBCUTANEOUS | 0 refills | Status: DC

## 2021-03-22 NOTE — Progress Notes (Signed)
Collin Le- pt needs to have this lovenox script filled. He needs to made aware that this is to prevent blood clots.   He needs to start the day fatre surgery. Please check if he needs financial help; and also lovenox teaching.   [name] - please follow up if he needs financial help.

## 2021-03-25 NOTE — Progress Notes (Signed)
Tried calling pt. No answer and VM was full. Will call back.

## 2021-03-26 NOTE — Progress Notes (Signed)
Spoke to patient. He will need patient assistance. He is ok with me completing and submitting the form to [facility] patient assistance. Form has been completed and faxed to number listed.

## 2021-03-27 ENCOUNTER — Other Ambulatory Visit: Payer: Self-pay

## 2021-03-27 ENCOUNTER — Ambulatory Visit (INDEPENDENT_AMBULATORY_CARE_PROVIDER_SITE_OTHER): Payer: Medicaid Other | Admitting: Gastroenterology

## 2021-03-27 DIAGNOSIS — Z23 Encounter for immunization: Secondary | ICD-10-CM

## 2021-03-27 NOTE — Progress Notes (Signed)
Patient received Hepatitis A and B vaccine in left arm. He had no side effects to the medication and tolerated the vaccine okay

## 2021-03-27 NOTE — Progress Notes (Signed)
Called pt to let him know that he needs to come by this week to sign Patient assistant forms in order to resend back to [facility]. He will come by this week at his convenience.

## 2021-03-28 ENCOUNTER — Other Ambulatory Visit: Admission: RE | Admit: 2021-03-28 | Payer: Medicaid Other | Source: Ambulatory Visit

## 2021-03-28 HISTORY — DX: Spontaneous bacterial peritonitis: K65.2

## 2021-03-28 HISTORY — DX: Portal hypertension: K76.6

## 2021-03-28 HISTORY — DX: Other ascites: R18.8

## 2021-03-28 HISTORY — DX: Cellulitis, unspecified: L03.90

## 2021-03-28 HISTORY — DX: Sepsis, unspecified organism: A41.9

## 2021-03-28 HISTORY — DX: Alcohol abuse, uncomplicated: F10.10

## 2021-03-29 NOTE — Progress Notes (Signed)
Tried calling pt again to remind him to come in to sign form. There was no answer and VM was full.

## 2021-03-29 NOTE — Progress Notes (Signed)
Dr. Jacinto Reap and Dr. Dahlia Byes Lovena Le, CMA has attempted multiple times to reach out to the patient to get him to sign forms for Lovenox patient assistance. We have been unsuccessful in reaching him directly; Lovena Le did speak to the pt's significant other. He has not come to the office to complete the financial assistance form.  I have contacted the preadmit testing dept to let them know as well. I am just concerned that he will not have the Lovenox prior to the surgery. Preadmit testing said they would make a note on the schedule to remind the patient to come to the cancer center to discuss the financial assistance.  I am cc: Honor Loh, NP on this patient as well as he works in Adult nurse.

## 2021-04-01 ENCOUNTER — Other Ambulatory Visit: Payer: Self-pay

## 2021-04-01 ENCOUNTER — Other Ambulatory Visit
Admission: RE | Admit: 2021-04-01 | Discharge: 2021-04-01 | Disposition: A | Discharge status: Home / Self Care | Payer: Medicaid Other | Source: Ambulatory Visit | Attending: Surgery | Admitting: Surgery

## 2021-04-01 ENCOUNTER — Telehealth: Payer: Self-pay | Admitting: Surgery

## 2021-04-01 DIAGNOSIS — I10 Essential (primary) hypertension: Secondary | ICD-10-CM | POA: Insufficient documentation

## 2021-04-01 DIAGNOSIS — Z01818 Encounter for other preprocedural examination: Secondary | ICD-10-CM | POA: Insufficient documentation

## 2021-04-01 DIAGNOSIS — Z20822 Contact with and (suspected) exposure to covid-19: Secondary | ICD-10-CM | POA: Insufficient documentation

## 2021-04-01 LAB — SARS CORONAVIRUS 2 (TAT 6-24 HRS): SARS Coronavirus 2: NEGATIVE

## 2021-04-01 NOTE — Telephone Encounter (Signed)
Patient is calling and had some questions about surgery coming up and some medication and is asking for one of the nurses to give him a call. Please call patient and advise.

## 2021-04-01 NOTE — Progress Notes (Signed)
Lovenox patient assistance forms were signed by patient and faxed back to [employer]. Allyn Kenner

## 2021-04-01 NOTE — Patient Instructions (Signed)
Your procedure is scheduled on: Thursday April 04, 2021. Su procedimiento est programado para: Jueves 7 de Abril del 2022. Report to Day Surgery inside [place] 2nd floor (stop by Admissions desk first before getting on elevator). Presntese a: [facility] 2ndo piso (registrese primero antes de subir al elevador) To find out your arrival time please call [phone] between 1PM - 3PM on Wednesday April 03, 2021. Para saber su hora de llegada por favor llame al [phone] entre la 1PM - 3PM el da: [place] 2022.  Remember: Instructions that are not followed completely may result in serious medical risk, up to and including death,  or upon the discretion of your surgeon and anesthesiologist your surgery may need to be rescheduled.  Recuerde: Las instrucciones que no se siguen completamente Heritage manager en un riesgo de salud grave, incluyendo hasta  la [place] o a discrecin de su cirujano y Environmental health practitioner, su ciruga se puede posponer.   __X_ 1.Do not eat food after midnight the night before your procedure. No    gum chewing or hard candies. You may drink clear liquids up to 2 hours     before you are scheduled to arrive for your surgery- DO not drink clear     Liquids within 2 hours of the start of your surgery.     Clear Liquids include:    water, apple juice without pulp, clear carbohydrate drink such as    Clearfast of [place]ade, Black Coffee or Tea (Do not add anything to coffee or tea).      No coma nada despus de la medianoche de la noche anterior a su    procedimiento. No coma chicles ni caramelos duros. Puede tomar    lquidos claros hasta 2 horas antes de su hora programada de llegada al     hospital para su procedimiento. No tome lquidos claros durante el     transcurso de las 2 horas de su llegada programada al hospital para su     procedimiento, ya que esto puede llevar a que su procedimiento se    retrase o tenga que  volver a Health and safety inspector.  Los lquidos claros incluyen:          - Agua o jugo de [place] sin pulpa          - Bebidas claras con carbohidratos como ClearFast o Gatorade          - Caf negro o t claro (sin leche, sin cremas, no agregue nada al caf ni al t)  No tome nada que no est en esta lista.  Los pacientes con diabetes tipo 1 y tipo 2 solo deben Agricultural engineer.  Llame a la clnica de PreCare o a la unidad de Same Day Surgery si  tiene alguna pregunta sobre estas instrucciones.              _X__ 2.Do Not Smoke or use e-cigarettes For 24 Hours Prior to Your Surgery.    Do not use any chewable tobacco products for at least 6   hours prior to surgery.    No fume ni use cigarrillos electrnicos durante las 24 horas previas    a su [place].  No use ningn producto de tabaco masticable durante   al menos 6 horas antes de la [place].     __X_ 3. No alcohol for 24 hours before or after surgery.    No tome alcohol durante las 24 horas antes ni  despus de la ciruga.   __X__4. On the morning of surgery brush your teeth with toothpaste and water, you                may rinse your mouth with mouthwash if you wish.  Do not swallow any toothpaste of mouthwash.   En la maana de la [place], cepllese los dientes con pasta de dientes y [place],                [place] enjuagarse la boca con enjuague bucal si lo desea. No ingiera ninguna pasta de dientes o enjuague bucal.   __X__ 5. Notify your doctor if there is any change in your medical condition (cold,fever, infections).    Informe a su mdico si hay algn cambio en su condicin mdica  (resfriado, fiebre, infecciones).   Do not wear jewelry, make-up, hairpins, clips or nail polish.  No use joyas, maquillajes, pinzas/ganchos para el cabello ni esmalte de uas.  Do not wear lotions, powders, or perfumes. You may wear deodorant.  No use lociones, polvos o perfumes.  Puede usar desodorante.    Do not shave 48 hours prior to surgery. Men may shave face  and neck.  No se afeite 48 horas antes de la [place].  Los hombres pueden [employer] cara  y el cuello.   Do not bring valuables to the hospital.   No lleve objetos [place] is not responsible for any belongings or valuables.  [place] no se hace responsable de ningn tipo de pertenencias u objetos de Geographical information systems officer.               Contacts, dentures or bridgework may not be worn into surgery.  Los lentes de [place], las dentaduras postizas o puentes no se pueden usar en la [place].   Leave your suitcase in the car. After surgery it may be brought to your room.  Deje su maleta en el auto.  Despus de la ciruga podr traerla a su habitacin.   For patients admitted to the hospital, discharge time is determined by your  treatment team.  Para los pacientes que sean ingresados al hospital, el tiempo en el cual se le  dar de alta es determinado por su equipo de [place].   Patients discharged the day of surgery will not be allowed to drive home. A los pacientes que se les da de alta el mismo da de la ciruga no se les permitir conducir a Holiday representative.   Please read over the following fact sheets that you were given: Por favor [place] hojas de informacin que le dieron:      __X__ Take these medicines the morning of surgery with A SIP OF WATER:          [employer] medicinas la maana de la ciruga con UN SORBO DE AGUA:  1. pantoprazole (PROTONIX) 40 MG   2.   3.   4.       5.  6.  ____ Fleet Enema (as directed)          Enema de Fleet (segn lo indicado)    __X__ Use CHG Soap as directed          Utilice el jabn de CHG segn lo indicado  ____ Use inhalers on the day of surgery          Use los inhaladores el da de la ciruga  ____ Stop metformin 2 days prior to surgery  Deje de tomar el metformin 2 das antes de la ciruga    ____ Take 1/2 of usual insulin dose the night before surgery and none on the morning of surgery           Tome  la mitad de la dosis habitual de insulina la noche antes de la [place] y no tome nada en la maana de la             ciruga  ____ Stop Coumadin/Plavix/aspirin           Deje de tomar el Coumadin/Plavix/aspirina el da:  __X__ Stop Anti-inflammatories such as Ibuprofen, Aleve, Advil, naproxen, aspirin and or BC powders or Goody powders.            Deje de tomar antiinflamatorios como Ibuprofin, Aleve, Advil, naproxen, aspirinas o polvos de BC powders o Goody powders   __X__ Stop supplements until after surgery  omega-3 acid ethyl esters (LOVAZA) 1 g (fish oil) and folic acid (FOLVITE) 1 MG           Deje de tomar suplementos hasta despus de la ciruga omega-3 acid ethyl esters (LOVAZA) 1 g (fish oil) and folic acid (FOLVITE) 1 MG   __X__ Do not start any herbal supplements before your procedure.           No empiece a tomar supplementos de hierbas antes de su cirugia.

## 2021-04-01 NOTE — Telephone Encounter (Signed)
Spoke with the patient and they are on their way to his pre admit testing appointment. I let the patient know they should answer most questions he would have about the surgery and medication. I let him know if he had any further questions that they did not cover he could call back here. He is amendable to this.

## 2021-04-02 ENCOUNTER — Other Ambulatory Visit: Admission: RE | Admit: 2021-04-02 | Payer: Medicaid Other | Source: Ambulatory Visit

## 2021-04-02 ENCOUNTER — Telehealth: Payer: Self-pay

## 2021-04-02 NOTE — Telephone Encounter (Signed)
Tried calling [employer] about lovenox medication. They stated that they could not expedite the order shipment to our office. They also stated that they will ONLY ship lovenox to the prescriber office. It will take 3-5 business days to receive. I am unsure that patient will be able to start the lovenox on 4/8 the day after his surgery. Can we get the rx sent in to the med management for assistance if pt does not get the medication on time?

## 2021-04-02 NOTE — Telephone Encounter (Signed)
Pt notified of neg covid results. Verbalizes understanding.

## 2021-04-03 DIAGNOSIS — H11001 Unspecified pterygium of right eye: Secondary | ICD-10-CM | POA: Insufficient documentation

## 2021-04-03 MED ORDER — ENOXAPARIN SODIUM 40 MG/0.4ML ~~LOC~~ SOLN: 40.0000 mg | SUBCUTANEOUS | 0 refills | Status: DC

## 2021-04-03 NOTE — Telephone Encounter (Signed)
Pt received approval for lovenox from the [employer] patient assistance program until 04/01/2022. They can not guarantee that pt will receive rx on 04/05/21 which is when pt needs it. Per [name] I reached out to Barnabas Lister to see if pt qualifies for the [place] program where medication can be covered for pt. [facility].  -Tried calling pt to let him know [facility]. No answer and VM was full. Will try back shortly.

## 2021-04-04 ENCOUNTER — Encounter
Admission: RE | Disposition: A | Discharge status: Home / Self Care | Payer: Self-pay | Source: Home / Self Care | Attending: Surgery

## 2021-04-04 ENCOUNTER — Other Ambulatory Visit: Payer: Self-pay

## 2021-04-04 ENCOUNTER — Ambulatory Visit
Admission: RE | Admit: 2021-04-04 | Discharge: 2021-04-04 | Disposition: A | Discharge status: Home / Self Care | Payer: Medicaid Other | Attending: Surgery | Admitting: Surgery

## 2021-04-04 ENCOUNTER — Ambulatory Visit: Payer: Medicaid Other | Admitting: Anesthesiology

## 2021-04-04 ENCOUNTER — Encounter: Payer: Self-pay | Admitting: Surgery

## 2021-04-04 DIAGNOSIS — K439 Ventral hernia without obstruction or gangrene: Secondary | ICD-10-CM

## 2021-04-04 DIAGNOSIS — K7469 Other cirrhosis of liver: Secondary | ICD-10-CM | POA: Insufficient documentation

## 2021-04-04 DIAGNOSIS — K4030 Unilateral inguinal hernia, with obstruction, without gangrene, not specified as recurrent: Secondary | ICD-10-CM | POA: Diagnosis present

## 2021-04-04 DIAGNOSIS — K429 Umbilical hernia without obstruction or gangrene: Secondary | ICD-10-CM | POA: Insufficient documentation

## 2021-04-04 DIAGNOSIS — Z86711 Personal history of pulmonary embolism: Secondary | ICD-10-CM | POA: Diagnosis not present

## 2021-04-04 HISTORY — PX: XI ROBOTIC ASSISTED INGUINAL HERNIA REPAIR WITH MESH: SHX6706

## 2021-04-04 HISTORY — PX: UMBILICAL HERNIA REPAIR: SHX196

## 2021-04-04 LAB — GLUCOSE, CAPILLARY
Glucose-Capillary: 119 mg/dL — ABNORMAL HIGH (ref 70–99)
Glucose-Capillary: 151 mg/dL — ABNORMAL HIGH (ref 70–99)

## 2021-04-04 SURGERY — REPAIR, HERNIA, INGUINAL, ROBOT-ASSISTED, LAPAROSCOPIC, USING MESH
Anesthesia: General

## 2021-04-04 MED ORDER — CHLORHEXIDINE GLUCONATE CLOTH 2 % EX PADS
6.0000 | MEDICATED_PAD | Freq: Once | CUTANEOUS | Status: AC
  Administered 2021-04-04: 6 via TOPICAL

## 2021-04-04 MED ORDER — DROPERIDOL 2.5 MG/ML IJ SOLN
0.6250 mg | Freq: Once | INTRAMUSCULAR | Status: DC | PRN
  Filled 2021-04-04: qty 2

## 2021-04-04 MED ORDER — FENTANYL CITRATE (PF) 100 MCG/2ML IJ SOLN
INTRAMUSCULAR | Status: AC
  Administered 2021-04-04: 50 ug via INTRAVENOUS
  Filled 2021-04-04: qty 2

## 2021-04-04 MED ORDER — BUPIVACAINE LIPOSOME 1.3 % IJ SUSP
INTRAMUSCULAR | Status: AC
  Filled 2021-04-04: qty 20

## 2021-04-04 MED ORDER — FENTANYL CITRATE (PF) 100 MCG/2ML IJ SOLN
INTRAMUSCULAR | Status: DC | PRN
  Administered 2021-04-04 (×2): 50 ug via INTRAVENOUS

## 2021-04-04 MED ORDER — SODIUM CHLORIDE 0.9 % IV SOLN: INTRAVENOUS | Status: DC

## 2021-04-04 MED ORDER — BUPIVACAINE-EPINEPHRINE 0.25% -1:200000 IJ SOLN
INTRAMUSCULAR | Status: DC | PRN
  Administered 2021-04-04: 30 mL

## 2021-04-04 MED ORDER — CHLORHEXIDINE GLUCONATE CLOTH 2 % EX PADS
6.0000 | MEDICATED_PAD | Freq: Once | CUTANEOUS | Status: AC
Start: 2021-04-04 — End: 2021-04-04
  Administered 2021-04-04: 6 via TOPICAL

## 2021-04-04 MED ORDER — PROPOFOL 10 MG/ML IV BOLUS
INTRAVENOUS | Status: AC
  Filled 2021-04-04: qty 40

## 2021-04-04 MED ORDER — PREGABALIN 50 MG PO CAPS
100.0000 mg | ORAL_CAPSULE | ORAL | Status: AC
  Filled 2021-04-04: qty 2

## 2021-04-04 MED ORDER — ROCURONIUM BROMIDE 100 MG/10ML IV SOLN
INTRAVENOUS | Status: DC | PRN
  Administered 2021-04-04: 20 mg via INTRAVENOUS
  Administered 2021-04-04 (×3): 10 mg via INTRAVENOUS
  Administered 2021-04-04: 50 mg via INTRAVENOUS

## 2021-04-04 MED ORDER — GABAPENTIN 300 MG PO CAPS: 300.0000 mg | ORAL_CAPSULE | ORAL | Status: AC

## 2021-04-04 MED ORDER — SODIUM CHLORIDE 0.9 % IV SOLN
INTRAVENOUS | Status: DC | PRN
  Administered 2021-04-04: 50 ug/min via INTRAVENOUS

## 2021-04-04 MED ORDER — OXYCODONE HCL 5 MG PO TABS: 5.0000 mg | ORAL_TABLET | ORAL | 0 refills | Status: DC | PRN

## 2021-04-04 MED ORDER — MIDAZOLAM HCL 2 MG/2ML IJ SOLN
INTRAMUSCULAR | Status: DC | PRN
  Administered 2021-04-04: 2 mg via INTRAVENOUS

## 2021-04-04 MED ORDER — GABAPENTIN 300 MG PO CAPS
ORAL_CAPSULE | ORAL | Status: AC
  Administered 2021-04-04: 300 mg via ORAL
  Filled 2021-04-04: qty 1

## 2021-04-04 MED ORDER — MIDAZOLAM HCL 2 MG/2ML IJ SOLN
INTRAMUSCULAR | Status: AC
  Filled 2021-04-04: qty 2

## 2021-04-04 MED ORDER — PROMETHAZINE HCL 25 MG/ML IJ SOLN: 6.2500 mg | INTRAMUSCULAR | Status: DC | PRN

## 2021-04-04 MED ORDER — PHENYLEPHRINE HCL (PRESSORS) 10 MG/ML IV SOLN
INTRAVENOUS | Status: DC | PRN
  Administered 2021-04-04 (×5): 100 ug via INTRAVENOUS

## 2021-04-04 MED ORDER — ONDANSETRON HCL 4 MG/2ML IJ SOLN
INTRAMUSCULAR | Status: DC | PRN
  Administered 2021-04-04: 4 mg via INTRAVENOUS

## 2021-04-04 MED ORDER — CHLORHEXIDINE GLUCONATE 0.12 % MT SOLN: 15.0000 mL | Freq: Once | OROMUCOSAL | Status: AC

## 2021-04-04 MED ORDER — BUPIVACAINE-EPINEPHRINE (PF) 0.25% -1:200000 IJ SOLN
INTRAMUSCULAR | Status: AC
  Filled 2021-04-04: qty 30

## 2021-04-04 MED ORDER — PROPOFOL 10 MG/ML IV BOLUS
INTRAVENOUS | Status: DC | PRN
  Administered 2021-04-04: 50 mg via INTRAVENOUS
  Administered 2021-04-04: 100 mg via INTRAVENOUS
  Administered 2021-04-04: 30 mg via INTRAVENOUS

## 2021-04-04 MED ORDER — HYDROCODONE-ACETAMINOPHEN 7.5-325 MG PO TABS
1.0000 | ORAL_TABLET | Freq: Once | ORAL | Status: AC | PRN
  Administered 2021-04-04: 1 via ORAL

## 2021-04-04 MED ORDER — CELECOXIB 200 MG PO CAPS: 200.0000 mg | ORAL_CAPSULE | ORAL | Status: AC

## 2021-04-04 MED ORDER — ACETAMINOPHEN 325 MG PO TABS
325.0000 mg | ORAL_TABLET | ORAL | Status: DC | PRN
Start: 2021-04-04 — End: 2021-04-04

## 2021-04-04 MED ORDER — CEFAZOLIN SODIUM-DEXTROSE 2-4 GM/100ML-% IV SOLN
2.0000 g | INTRAVENOUS | Status: AC
  Administered 2021-04-04: 2 g via INTRAVENOUS

## 2021-04-04 MED ORDER — PREGABALIN 50 MG PO CAPS
ORAL_CAPSULE | ORAL | Status: AC
  Administered 2021-04-04: 100 mg via ORAL
  Filled 2021-04-04: qty 2

## 2021-04-04 MED ORDER — VISTASEAL 10 ML SINGLE DOSE KIT
PACK | CUTANEOUS | Status: AC
  Filled 2021-04-04: qty 10

## 2021-04-04 MED ORDER — VISTASEAL 10 ML SINGLE DOSE KIT
PACK | CUTANEOUS | Status: DC | PRN
  Administered 2021-04-04: 10 mL via TOPICAL

## 2021-04-04 MED ORDER — CELECOXIB 200 MG PO CAPS
ORAL_CAPSULE | ORAL | Status: AC
  Administered 2021-04-04: 200 mg via ORAL
  Filled 2021-04-04: qty 1

## 2021-04-04 MED ORDER — DEXAMETHASONE SODIUM PHOSPHATE 10 MG/ML IJ SOLN
INTRAMUSCULAR | Status: DC | PRN
  Administered 2021-04-04: 5 mg via INTRAVENOUS

## 2021-04-04 MED ORDER — ORAL CARE MOUTH RINSE: 15.0000 mL | Freq: Once | OROMUCOSAL | Status: AC

## 2021-04-04 MED ORDER — LACTATED RINGERS IV SOLN: INTRAVENOUS | Status: DC | PRN

## 2021-04-04 MED ORDER — CEFAZOLIN SODIUM-DEXTROSE 2-4 GM/100ML-% IV SOLN
INTRAVENOUS | Status: AC
  Filled 2021-04-04: qty 100

## 2021-04-04 MED ORDER — ACETAMINOPHEN 160 MG/5ML PO SOLN
325.0000 mg | ORAL | Status: DC | PRN
Start: 2021-04-04 — End: 2021-04-04
  Filled 2021-04-04: qty 20.3

## 2021-04-04 MED ORDER — FENTANYL CITRATE (PF) 100 MCG/2ML IJ SOLN
INTRAMUSCULAR | Status: AC
  Filled 2021-04-04: qty 2

## 2021-04-04 MED ORDER — BUPIVACAINE LIPOSOME 1.3 % IJ SUSP
INTRAMUSCULAR | Status: DC | PRN
  Administered 2021-04-04: 20 mL

## 2021-04-04 MED ORDER — FENTANYL CITRATE (PF) 100 MCG/2ML IJ SOLN
25.0000 ug | INTRAMUSCULAR | Status: DC | PRN
  Administered 2021-04-04: 50 ug via INTRAVENOUS

## 2021-04-04 MED ORDER — CHLORHEXIDINE GLUCONATE 0.12 % MT SOLN
OROMUCOSAL | Status: AC
  Administered 2021-04-04: 15 mL via OROMUCOSAL
  Filled 2021-04-04: qty 15

## 2021-04-04 MED ORDER — SUGAMMADEX SODIUM 200 MG/2ML IV SOLN
INTRAVENOUS | Status: DC | PRN
  Administered 2021-04-04: 200 mg via INTRAVENOUS

## 2021-04-04 MED ORDER — HYDROCODONE-ACETAMINOPHEN 7.5-325 MG PO TABS
ORAL_TABLET | ORAL | Status: AC
  Filled 2021-04-04: qty 1

## 2021-04-04 SURGICAL SUPPLY — 72 items
ADH SKN CLS APL DERMABOND .7 (GAUZE/BANDAGES/DRESSINGS) ×2
APL LAPSCP 35 DL APL RGD (MISCELLANEOUS) ×2
APL PRP STRL LF DISP 70% ISPRP (MISCELLANEOUS) ×2
APPLICATOR VISTASEAL 35 (MISCELLANEOUS) ×3 IMPLANT
APPLIER CLIP 11 MED OPEN (CLIP)
APR CLP MED 11 20 MLT OPN (CLIP)
BLADE CLIPPER SURG (BLADE) ×3 IMPLANT
BLADE SURG 15 STRL LF DISP TIS (BLADE) IMPLANT
BLADE SURG 15 STRL SS (BLADE)
CANISTER SUCT 1200ML W/VALVE (MISCELLANEOUS) IMPLANT
CANNULA REDUC XI 12-8 STAPL (CANNULA) ×1
CANNULA REDUCER 12-8 DVNC XI (CANNULA) ×2 IMPLANT
CHLORAPREP W/TINT 26 (MISCELLANEOUS) ×3 IMPLANT
CLIP APPLIE 11 MED OPEN (CLIP) IMPLANT
COVER TIP SHEARS 8 DVNC (MISCELLANEOUS) ×2 IMPLANT
COVER TIP SHEARS 8MM DA VINCI (MISCELLANEOUS) ×1
COVER WAND RF STERILE (DRAPES) ×3 IMPLANT
DEFOGGER SCOPE WARMER CLEARIFY (MISCELLANEOUS) ×3 IMPLANT
DERMABOND ADVANCED (GAUZE/BANDAGES/DRESSINGS) ×1
DERMABOND ADVANCED .7 DNX12 (GAUZE/BANDAGES/DRESSINGS) ×2 IMPLANT
DRAPE 3/4 80X56 (DRAPES) IMPLANT
DRAPE ARM DVNC X/XI (DISPOSABLE) ×6 IMPLANT
DRAPE COLUMN DVNC XI (DISPOSABLE) ×2 IMPLANT
DRAPE DA VINCI XI ARM (DISPOSABLE) ×3
DRAPE DA VINCI XI COLUMN (DISPOSABLE) ×1
DRAPE INCISE IOBAN 66X45 STRL (DRAPES) IMPLANT
DRAPE LAPAROTOMY 77X122 PED (DRAPES) IMPLANT
DRSG TELFA 3X8 NADH (GAUZE/BANDAGES/DRESSINGS) IMPLANT
ELECT CAUTERY BLADE 6.4 (BLADE) ×3 IMPLANT
ELECT REM PT RETURN 9FT ADLT (ELECTROSURGICAL) ×3
ELECTRODE REM PT RTRN 9FT ADLT (ELECTROSURGICAL) ×2 IMPLANT
GLOVE SURG ENC MOIS LTX SZ7 (GLOVE) ×12 IMPLANT
GOWN STRL REUS W/ TWL LRG LVL3 (GOWN DISPOSABLE) ×8 IMPLANT
GOWN STRL REUS W/TWL LRG LVL3 (GOWN DISPOSABLE) ×12
IRRIGATION STRYKERFLOW (MISCELLANEOUS) IMPLANT
IRRIGATOR STRYKERFLOW (MISCELLANEOUS)
IV NS 1000ML (IV SOLUTION)
IV NS 1000ML BAXH (IV SOLUTION) IMPLANT
KIT PINK PAD W/HEAD ARE REST (MISCELLANEOUS) ×3
KIT PINK PAD W/HEAD ARM REST (MISCELLANEOUS) ×2 IMPLANT
LABEL OR SOLS (LABEL) ×3 IMPLANT
MANIFOLD NEPTUNE II (INSTRUMENTS) ×3 IMPLANT
MESH 3DMAX 5X7 LT XLRG (Mesh General) ×3 IMPLANT
MESH VENTRALEX ST 1-7/10 CRC S (Mesh General) ×3 IMPLANT
NEEDLE HYPO 22GX1.5 SAFETY (NEEDLE) ×3 IMPLANT
NS IRRIG 500ML POUR BTL (IV SOLUTION) ×3 IMPLANT
OBTURATOR OPTICAL STANDARD 8MM (TROCAR) ×1
OBTURATOR OPTICAL STND 8 DVNC (TROCAR) ×2
OBTURATOR OPTICALSTD 8 DVNC (TROCAR) ×2 IMPLANT
PACK BASIN MINOR ARMC (MISCELLANEOUS) IMPLANT
PACK LAP CHOLECYSTECTOMY (MISCELLANEOUS) ×3 IMPLANT
PENCIL ELECTRO HAND CTR (MISCELLANEOUS) ×3 IMPLANT
SEAL CANN UNIV 5-8 DVNC XI (MISCELLANEOUS) ×6 IMPLANT
SEAL XI 5MM-8MM UNIVERSAL (MISCELLANEOUS) ×3
SET TUBE SMOKE EVAC HIGH FLOW (TUBING) ×3 IMPLANT
SOLUTION ELECTROLUBE (MISCELLANEOUS) ×3 IMPLANT
SPONGE LAP 18X18 RF (DISPOSABLE) ×3 IMPLANT
SPONGE LAP 4X18 RFD (DISPOSABLE) ×3 IMPLANT
STAPLER CANNULA SEAL DVNC XI (STAPLE) ×2 IMPLANT
STAPLER CANNULA SEAL XI (STAPLE) ×1
SUT ETHIBOND NAB MO 7 #0 18IN (SUTURE) ×6 IMPLANT
SUT MNCRL AB 4-0 PS2 18 (SUTURE) ×3 IMPLANT
SUT V-LOC 90 ABS 3-0 VLT  V-20 (SUTURE) ×2
SUT V-LOC 90 ABS 3-0 VLT V-20 (SUTURE) ×4 IMPLANT
SUT VIC AB 2-0 SH 27 (SUTURE) ×6
SUT VIC AB 2-0 SH 27XBRD (SUTURE) ×4 IMPLANT
SUT VIC AB 3-0 SH 27 (SUTURE) ×3
SUT VIC AB 3-0 SH 27X BRD (SUTURE) ×2 IMPLANT
SUT VICRYL 0 AB UR-6 (SUTURE) ×6 IMPLANT
SYR 20ML LL LF (SYRINGE) ×3 IMPLANT
TAPE TRANSPORE STRL 2 31045 (GAUZE/BANDAGES/DRESSINGS) ×3 IMPLANT
TROCAR BALLN GELPORT 12X130M (ENDOMECHANICALS) ×3 IMPLANT

## 2021-04-04 NOTE — Anesthesia Preprocedure Evaluation (Addendum)
Anesthesia Evaluation  Patient identified by MRN, date of birth, ID band Patient awake    Reviewed: Allergy & Precautions, H&P , NPO status , reviewed documented beta blocker date and time   Airway Mallampati: III  TM Distance: >3 FB Neck ROM: limited    Dental  (+) Poor Dentition, Chipped, Missing   Pulmonary pneumonia, resolved, Current Smoker and Patient abstained from smoking.,    Pulmonary exam normal        Cardiovascular Exercise Tolerance: Good +CHF  Normal cardiovascular exam  05/2020 ECHO IMPRESSIONS    1. Left ventricular ejection fraction, by estimation, is >55%. The left  ventricle has normal function. Left ventricular endocardial border not  optimally defined to evaluate regional wall motion. There is moderate left  ventricular hypertrophy. Left  ventricular diastolic function could not be evaluated.  2. Right ventricular systolic function is normal. The right ventricular  size is normal.  3. The mitral valve is grossly normal. No evidence of mitral valve  regurgitation.  4. The aortic valve was not well visualized. Aortic valve regurgitation  not well assessed.  5. Pulmonic valve regurgitation not well assessed.    Neuro/Psych PSYCHIATRIC DISORDERS    GI/Hepatic neg GERD  ,(+) Cirrhosis       , Compensated liver function without ascites    Endo/Other  diabetes  Renal/GU      Musculoskeletal   Abdominal   Peds  Hematology   Anesthesia Other Findings Past Medical History: No date: Alcohol abuse No date: Ascites No date: Cataract No date: Cellulitis No date: CHF (congestive heart failure) (HCC) No date: Cirrhosis ([place]) No date: Diabetes mellitus without complication ([place]) 7741: History of methicillin resistant staphylococcus aureus (MRSA) 2021: Pneumonia No date: Portal hypertension ([place]) No date: Pulmonary embolus ([place]) No date: Sepsis ([place]) No date: Spontaneous bacterial  peritonitis ([place]) Past Surgical History: No date: CATARACT EXTRACTION 01/03/2021: COLONOSCOPY WITH PROPOFOL; N/A     Comment:  Procedure: COLONOSCOPY WITH PROPOFOL;  Surgeon: Lin Landsman, MD;  Location: [facility] ENDOSCOPY;  Service:               Gastroenterology;  Laterality: N/A; 01/03/2021: ESOPHAGOGASTRODUODENOSCOPY (EGD) WITH PROPOFOL; N/A     Comment:  Procedure: ESOPHAGOGASTRODUODENOSCOPY (EGD) WITH               PROPOFOL;  Surgeon: Lin Landsman, MD;  Location:               [facility] ENDOSCOPY;  Service: Gastroenterology;  Laterality:               N/A; No date: EYE SURGERY 2021: PARACENTESIS   Reproductive/Obstetrics                           Anesthesia Physical Anesthesia Plan  ASA: III  Anesthesia Plan: General ETT   Post-op Pain Management:    Induction: Intravenous  PONV Risk Score and Plan: 2 and Ondansetron, Treatment may vary due to age or medical condition, Midazolam and Dexamethasone  Airway Management Planned: Oral ETT  Additional Equipment:   Intra-op Plan:   Post-operative Plan: Extubation in OR  Informed Consent: I have reviewed the patients History and Physical, chart, labs and discussed the procedure including the risks, benefits and alternatives for the proposed anesthesia with the patient or authorized representative who has indicated his/her understanding and acceptance.     Dental Advisory  Given  Plan Discussed with: CRNA  Anesthesia Plan Comments:        Anesthesia Quick Evaluation

## 2021-04-04 NOTE — Transfer of Care (Signed)
Immediate Anesthesia Transfer of Care Note  Patient: Collin Byrd  Procedure(s) Performed: XI ROBOTIC ASSISTED INGUINAL HERNIA REPAIR WITH MESH (Left ) HERNIA REPAIR UMBILICAL ADULT, open (N/A )  Patient Location: PACU  Anesthesia Type:General  Level of Consciousness: drowsy  Airway & Oxygen Therapy: Patient Spontanous Breathing  Post-op Assessment: Report given to RN and Post -op Vital signs reviewed and stable  Post vital signs: Reviewed and stable  Last Vitals:  Vitals Value Taken Time  BP 100/61 04/04/21 1107  Temp 37.1 C 04/04/21 1107  Pulse 83 04/04/21 1110  Resp 16 04/04/21 1110  SpO2 99 % 04/04/21 1110  Vitals shown include unvalidated device data.  Last Pain:  Vitals:   04/04/21 0619  PainSc: 0-No pain         Complications: No complications documented.

## 2021-04-04 NOTE — Anesthesia Procedure Notes (Signed)
Procedure Name: Intubation Date/Time: 04/04/2021 7:40 AM Performed by: Louann Sjogren, CRNA Pre-anesthesia Checklist: Patient identified, Patient being monitored, Timeout performed, Emergency Drugs available and Suction available Patient Re-evaluated:Patient Re-evaluated prior to induction Oxygen Delivery Method: Circle system utilized Preoxygenation: Pre-oxygenation with 100% oxygen Induction Type: IV induction Ventilation: Mask ventilation without difficulty Laryngoscope Size: Mac and 4 Grade View: Grade II Tube type: Oral Tube size: 7.0 mm Number of attempts: 1 Airway Equipment and Method: Stylet Placement Confirmation: ETT inserted through vocal cords under direct vision,  positive ETCO2 and breath sounds checked- equal and bilateral Secured at: 22 cm Tube secured with: Tape Dental Injury: Teeth and Oropharynx as per pre-operative assessment

## 2021-04-04 NOTE — Interval H&P Note (Signed)
History and Physical Interval Note:  04/04/2021 7:23 AM  Collin Byrd  has presented today for surgery, with the diagnosis of inguinal hernia.  The various methods of treatment have been discussed with the patient and family. After consideration of risks, benefits and other options for treatment, the patient has consented to  Procedure(s): XI ROBOTIC [place] (Left) HERNIA REPAIR UMBILICAL ADULT, open (N/A) as a surgical intervention.  The patient's history has been reviewed, patient examined, no change in status, stable for surgery.  I have reviewed the patient's chart and labs.  Questions were answered to the patient's satisfaction.     [place]

## 2021-04-04 NOTE — Telephone Encounter (Signed)
Left message with girlfriend Altha Harm to make sure that [facility]. She took down information. I am not certain she fully understood message. I have made it clear that patient NEEDS to start lovenox tomorrow. She stated she will let pt know and make sure medication gets picked up. Patient is currently having procedure done so he was not home.

## 2021-04-04 NOTE — Anesthesia Postprocedure Evaluation (Signed)
Anesthesia Post Note  Patient: Collin Byrd  Procedure(s) Performed: XI ROBOTIC ASSISTED INGUINAL HERNIA REPAIR WITH MESH (Left ) HERNIA REPAIR UMBILICAL ADULT, open (N/A )  Patient location during evaluation: PACU Anesthesia Type: General Level of consciousness: awake and alert Pain management: pain level controlled Vital Signs Assessment: post-procedure vital signs reviewed and stable Respiratory status: spontaneous breathing, nonlabored ventilation and respiratory function stable Cardiovascular status: blood pressure returned to baseline and stable Postop Assessment: no apparent nausea or vomiting Anesthetic complications: no   No complications documented.   Last Vitals:  Vitals:   04/04/21 1145 04/04/21 1220  BP: 112/72 101/61  Pulse: 76 70  Resp: 11 15  Temp: 37 C (!) 36.3 C  SpO2: 97% 100%    Last Pain:  Vitals:   04/04/21 1220  TempSrc: Tympanic  PainSc: 10-Worst pain ever                 Alphonsus Sias

## 2021-04-04 NOTE — Telephone Encounter (Signed)
Tried reaching out to Brogan, the family member that took patient to procedure, to let her know about lovenox rx. Was unable to get in touch with her. Called the Altha Harm back to make sure she called [facility] to make her aware to stop by pharmacy after procedure to get Lovenox rx. She stated that she told Samantha and they were on the way to get the rx now.

## 2021-04-04 NOTE — Op Note (Signed)
Robotic assisted Laparoscopic Transabdominal Left Giant Inguinal Hernia Repair with 3 D X-large Mesh       Pre-operative Diagnosis: Giant Left chronically incarcerated Inguinal Hernia, ventral hernia Compensated cirrhosis   Post-operative Diagnosis: Same   Procedure: Robotic  Laparoscopic  repair of Left indirect inguinal hernia Open repair of ventral hernia ( periumbilical) with mesh Ventralex patch    Surgeon: Caroleen Hamman, MD FACS   Anesthesia: Gen. with endotracheal tube   Findings: Giant Left inguinal hernia and 2 cm ventral hernia  . No ascites Complex case due to sigmoid colon being chronically incarcerated.   Procedure Details  The patient was seen again in the Holding Room. The benefits, complications, treatment options, and expected outcomes were discussed with the patient. The risks of bleeding, infection, recurrence of symptoms, failure to resolve symptoms, recurrence of hernia, ischemic orchitis, chronic pain syndrome or neuroma, were discussed again. The likelihood of improving the patient's symptoms with return to their baseline status is good.  The patient and/or family concurred with the proposed plan, giving informed consent.  The patient was taken to Operating Room, identified  and the procedure verified as Laparoscopic Inguinal Hernia Repair. Laterality confirmed.  A Time Out was held and the above information confirmed.   Prior to the induction of general anesthesia, antibiotic prophylaxis was administered. VTE prophylaxis was in place. General endotracheal anesthesia was then administered and tolerated well. After the induction, the abdomen was prepped with Chloraprep and draped in the sterile fashion. The patient was positioned in the supine position.     Supraumbilical incision created and cut down technique used to enter the abdominal cavity. Hernia encountered and edges were dissected with cautery. Stay sutures placed and hernia sac excised. Hasson trochar placed  un der direct visualization. Pneumoperitoneum obtained w/o HD changes. No evidence of bowel injuries.  Two 8 mm placed under direct vision. The laparoscopy revealed massive indirect defect with chronically incarcerated sigmoid. I inserted the needles and the mesh. The robot was brought ot the table and docked in the standard fashion, no collision between arms was observed. Instruments were kept under direct view at all times. Lysis of adhesions created tto mobilize sigmoid colon from hernia sac, This was used mainly with scissors and some areas with cautery where I felt it was safe to  Use it. We started on the Left side were a flap was created. The sac was reduced and dissected free from adjacent structures. We preserved the vas and the vessels. Please note that this was a very tedious process given the giant defect, the chronicity. We were very safe and avoided any inadvertent injuries.  Once dissection was completed a Xlarge 3D mesh was placed and secured with two interrupted vicryl attached to the pubic tubercle. There was good coverage of the direct, indirect and femoral spaces. The flap was closed with v lock suture.     Once assuring that hemostasis was adequate the ports were removed pneumoperitoneum release. Liposomal marcaine was used on all incisions. A small Ventralex patch was inserted and was secured in 4 corners with transfascial sutures using 0 Ethibond sutures.  The fascial defect was closed also incorporating the anterior aspect of the mesh.  The mesh layed really nice against abdominal wall.  3-0 Vicryl sutures was used to close subcutaneous tissue.  4-0 subcuticular Monocryl was used at all skin edges. Dermabond was placed.  Patient tolerated the procedure well. There were no complications. He was taken to the recovery room in stable condition.  Caroleen Hamman, MD, FACS

## 2021-04-04 NOTE — Discharge Instructions (Addendum)
Reparacin laparoscpica de hernia inguinal en los adultos, cuidados posteriores Laparoscopic Inguinal Hernia Repair, Adult, Care After La siguiente informacin ofrece orientacin sobre cmo cuidarse despus del procedimiento. El mdico tambin podr darle instrucciones ms especficas. Comunquese con el mdico si tiene problemas o preguntas. Qu puedo esperar despus del procedimiento? Despus del procedimiento, es normal tener los siguientes sntomas:  Dolor.  Hinchazn y moretones alrededor de la zona de la incisin.  Hinchazn en el escroto, en los hombres.  Un poco de lquido o [employer] de las incisiones. Siga estas instrucciones en su casa: Medicamentos  Use los medicamentos de venta libre y los recetados solamente como se lo haya indicado el mdico.  Pregntele al mdico si el medicamento recetado: ? Hace necesario que evite conducir o Musician. ? Puede causarle estreimiento. Es posible que tenga que tomar estas medidas para prevenir o tratar el estreimiento:  Electronics engineer suficiente lquido como para Theatre manager la orina de color amarillo plido.  Usar medicamentos recetados o de Radio broadcast assistant.  Consumir alimentos ricos en fibra, como frijoles, cereales integrales, y frutas y verduras frescas.  Limitar el consumo de alimentos ricos en grasa y azcares procesados, como los alimentos fritos o dulces. Cuidado de la incisin  Siga las instrucciones del mdico acerca del cuidado de la incisin. Asegrese de hacer lo siguiente: ? Lvese las manos con agua y jabn durante al menos 20segundos antes y despus de cambiarse la venda (vendaje). Use desinfectante para manos si no dispone de [place]. ? Cambie el vendaje como se lo haya indicado el mdico. ? No retire los puntos (suturas), la goma para cerrar la piel o las tiras [place]. Es posible que estos cierres cutneos deban quedar puestos en la piel durante 2semanas o ms tiempo. Si los bordes de las tiras adhesivas  empiezan a despegarse y Therapist, sports, puede recortar los que estn sueltos. No retire las tiras [facility] por completo a menos que el mdico se lo indique.  Controle la zona de la incisin todos los das para detectar signos de infeccin. Est atento a los siguientes signos: ? Aumento del enrojecimiento, la hinchazn o Conservation officer, historic buildings. ? Ms lquido Delorise Shiner. ? Calor. ? Pus o mal olor.  Use ropa holgada y [employer] las incisiones se hayan curado.   Control del dolor y la hinchazn Si se lo indican, aplique hielo sobre las zonas doloridas o hinchadas. Para hacer esto:  Ponga el hielo en una bolsa plstica.  Coloque una toalla entre la piel y Therapist, nutritional.  Aplique el hielo durante 32minutos, 2 o 3veces por da.  Retire el hielo si la piel se pone de color rojo brillante. Esto es [employer]. Si no puede sentir dolor, calor o fro, tiene un mayor riesgo de que se dae la zona.   Actividad  No levante ningn objeto que pese ms de 10libras (4.5kg) o que supere el lmite de peso que le hayan indicado, hasta que el mdico le diga que puede [place].  Pregntele al mdico qu actividades son seguras para usted. Mucha actividad durante la primera semana despus de la ciruga puede aumentar el dolor y la hinchazn. Durante 1semana despus del procedimiento: ? Evite las actividades que requieran mucho esfuerzo, como el ejercicio o los deportes. ? Puede caminar y subir escaleras segn sea necesario para las actividades cotidianas, pero evite realizar caminatas largas o subir escaleras para hacer ejercicio. Instrucciones generales  Si le administraron un sedante durante el procedimiento, puede afectarlo por varias horas. No  conduzca ni opere maquinaria hasta que el mdico le indique que es seguro [place].  No tome baos de inmersin, no nade ni use el jacuzzi hasta que el mdico lo autorice. Pregntele al mdico si puede ducharse. Thurston Pounds solo le permitan darse baos de [place].  No consuma  ningn producto que contenga nicotina o tabaco. Estos productos incluyen cigarrillos, tabaco para Higher education careers adviser y aparatos de vapeo, como los Psychologist, sport and exercise. Si necesita ayuda para dejar de fumar, consulte al mdico.  Cumpla con todas las visitas de seguimiento. Esto es importante. Comunquese con un mdico si:  Tiene cualquiera de estos signos de infeccin: ? Aumento del enrojecimiento, la hinchazn o el dolor alrededor de las incisiones o de la zona de la ingle. ? Ms lquido o sangre proveniente de una incisin. ? Calor proveniente de una incisin. ? Pus o mal olor provenientes de la incisin. ? Cristy Hilts o escalofros.  Ms hinchazn en el escroto, si es hombre.  Siente un dolor intenso y los medicamentos no Community education officer.  Tiene dolor o hinchazn abdominales.  No puede orinar o defecar.  Se siente mareado o se desmaya.  Tiene nuseas y vmitos. Solicite ayuda de inmediato si:  Tiene enrojecimiento, calor o dolor en la pierna.  Siente dolor en el pecho.  Tiene dificultad para respirar. Estos sntomas pueden representar un problema grave que constituye Engineer, maintenance (IT). No espere a ver si los sntomas desaparecen. Solicite atencin mdica de inmediato. Comunquese con el servicio de emergencias de su localidad (911 en los [place]). No conduzca por sus propios medios [employer]. Resumen  Despus del procedimiento, es normal tener dolor, hinchazn y moretones.  Fortuna zona de la incisin para Hydrographic surveyor signos de infeccin, como aumento del enrojecimiento, de la hinchazn o del Social research officer, government.  Aplique hielo en las zonas doloridas o hinchadas durante 97minutos, de 2a3veces por da. Esta informacin no tiene Marine scientist el consejo del mdico. Asegrese de hacerle al mdico cualquier pregunta que tenga. Document Revised: 10/12/2020 Document Reviewed: 10/12/2020 Elsevier Patient Education  2021 [place]   1) The drugs that you were given will stay in your system until tomorrow so for the next 24 hours you should not:  A) Drive an automobile B) Make any legal decisions C) Drink any alcoholic beverage   2) You may resume regular meals tomorrow.  Today it is better to start with liquids and gradually work up to solid foods.  You may eat anything you prefer, but it is better to start with liquids, then soup and crackers, and gradually work up to solid foods.   3) Please notify your doctor immediately if you have any unusual bleeding, trouble breathing, redness and pain at the surgery site, drainage, fever, or pain not relieved by medication.  4) Your post-operative visit with Dr.                                     is: Date:                        Time:    Please call to schedule your post-operative visit.  5) Additional Instructions:   Bupivacaine Liposomal Suspension for Injection  LEAVE GREEN ARM BAND ON FOR 4 DAYS What is this medicine? BUPIVACAINE LIPOSOMAL (bue PIV a kane LIP oh som al) is  an anesthetic. It causes loss of feeling in the skin or other tissues. It is used to prevent and to treat pain from some procedures. This medicine may be used for other purposes; ask your health care provider or pharmacist if you have questions. COMMON BRAND NAME(S): EXPAREL What should I tell my health care provider before I take this medicine? They need to know if you have any of these conditions:  G6PD deficiency  heart disease  kidney disease  liver disease  low blood pressure  lung or breathing disease, like asthma  an unusual or allergic reaction to bupivacaine, other medicines, foods, dyes, or preservatives  pregnant or trying to get pregnant  breast-feeding How should I use this medicine? This medicine is injected into the affected area. It is given by a health care provider in a hospital or clinic setting. Talk to your health care provider about the  use of this medicine in children. While it may be given to children as young as 6 years for selected conditions, precautions do apply. Overdosage: If you think you have taken too much of this medicine contact a poison control center or emergency room at once. NOTE: This medicine is only for you. Do not share this medicine with others. What if I miss a dose? This does not apply. What may interact with this medicine? This medicine may interact with the following medications:  acetaminophen  certain antibiotics like dapsone, nitrofurantoin, aminosalicylic acid, sulfonamides  certain medicines for seizures like phenobarbital, phenytoin, valproic acid  chloroquine  cyclophosphamide  flutamide  hydroxyurea  ifosfamide  metoclopramide  nitric oxide  nitroglycerin  nitroprusside  nitrous oxide  other local anesthetics like lidocaine, pramoxine, tetracaine  primaquine  quinine  rasburicase  sulfasalazine This list may not describe all possible interactions. Give your health care provider a list of all the medicines, herbs, non-prescription drugs, or dietary supplements you use. Also tell them if you smoke, drink alcohol, or use illegal drugs. Some items may interact with your medicine. What should I watch for while using this medicine? Your condition will be monitored carefully while you are receiving this medicine. Be careful to avoid injury while the area is numb, and you are not aware of pain. What side effects may I notice from receiving this medicine? Side effects that you should report to your doctor or health care professional as soon as possible:  allergic reactions like skin rash, itching or hives, swelling of the face, lips, or tongue  seizures  signs and symptoms of a dangerous change in heartbeat or heart rhythm like chest pain; dizziness; fast, irregular heartbeat; palpitations; feeling faint or lightheaded; falls; breathing problems  signs and symptoms of  methemoglobinemia such as pale, gray, or blue colored skin; headache; fast heartbeat; shortness of breath; feeling faint or lightheaded, falls; tiredness Side effects that usually do not require medical attention (report to your doctor or health care professional if they continue or are bothersome):  anxious  back pain  changes in taste  changes in vision  constipation  dizziness  fever  nausea, vomiting This list may not describe all possible side effects. Call your doctor for medical advice about side effects. You may report side effects to FDA at 1-800-FDA-1088. Where should I keep my medicine? This drug is given in a hospital or clinic and will not be stored at home. NOTE: This sheet is a summary. It may not cover all possible information. If you have questions about this medicine, talk to your doctor,  pharmacist, or health care provider.  2021 Elsevier/Gold Standard (2020-03-22 12:24:57)

## 2021-04-05 ENCOUNTER — Encounter: Payer: Self-pay | Admitting: Surgery

## 2021-04-05 LAB — SURGICAL PATHOLOGY

## 2021-04-07 ENCOUNTER — Emergency Department: Payer: Self-pay

## 2021-04-07 ENCOUNTER — Emergency Department
Admission: EM | Admit: 2021-04-07 | Discharge: 2021-04-08 | Disposition: A | Discharge status: Home / Self Care | Payer: Self-pay | Attending: Emergency Medicine | Admitting: Emergency Medicine

## 2021-04-07 ENCOUNTER — Encounter: Payer: Self-pay | Admitting: Emergency Medicine

## 2021-04-07 DIAGNOSIS — T8141XA Infection following a procedure, superficial incisional surgical site, initial encounter: Secondary | ICD-10-CM | POA: Insufficient documentation

## 2021-04-07 DIAGNOSIS — Z7901 Long term (current) use of anticoagulants: Secondary | ICD-10-CM | POA: Insufficient documentation

## 2021-04-07 DIAGNOSIS — I509 Heart failure, unspecified: Secondary | ICD-10-CM | POA: Insufficient documentation

## 2021-04-07 DIAGNOSIS — E119 Type 2 diabetes mellitus without complications: Secondary | ICD-10-CM | POA: Insufficient documentation

## 2021-04-07 DIAGNOSIS — F17210 Nicotine dependence, cigarettes, uncomplicated: Secondary | ICD-10-CM | POA: Insufficient documentation

## 2021-04-07 LAB — CBC WITH DIFFERENTIAL/PLATELET
Abs Immature Granulocytes: 0.15 10*3/uL — ABNORMAL HIGH (ref 0.00–0.07)
Basophils Absolute: 0.1 10*3/uL (ref 0.0–0.1)
Basophils Relative: 1 %
Eosinophils Absolute: 0.2 10*3/uL (ref 0.0–0.5)
Eosinophils Relative: 1 %
HCT: 30.4 % — ABNORMAL LOW (ref 39.0–52.0)
Hemoglobin: 10.3 g/dL — ABNORMAL LOW (ref 13.0–17.0)
Immature Granulocytes: 1 %
Lymphocytes Relative: 6 %
Lymphs Abs: 0.8 10*3/uL (ref 0.7–4.0)
MCH: 30.7 pg (ref 26.0–34.0)
MCHC: 33.9 g/dL (ref 30.0–36.0)
MCV: 90.5 fL (ref 80.0–100.0)
Monocytes Absolute: 1.4 10*3/uL — ABNORMAL HIGH (ref 0.1–1.0)
Monocytes Relative: 11 %
Neutro Abs: 10.4 10*3/uL — ABNORMAL HIGH (ref 1.7–7.7)
Neutrophils Relative %: 80 %
Platelets: 257 10*3/uL (ref 150–400)
RBC: 3.36 MIL/uL — ABNORMAL LOW (ref 4.22–5.81)
RDW: 14.5 % (ref 11.5–15.5)
WBC: 13.1 10*3/uL — ABNORMAL HIGH (ref 4.0–10.5)
nRBC: 0 % (ref 0.0–0.2)

## 2021-04-07 LAB — COMPREHENSIVE METABOLIC PANEL
ALT: 13 U/L (ref 0–44)
AST: 14 U/L — ABNORMAL LOW (ref 15–41)
Albumin: 3.5 g/dL (ref 3.5–5.0)
Alkaline Phosphatase: 46 U/L (ref 38–126)
Anion gap: 9 (ref 5–15)
BUN: 25 mg/dL — ABNORMAL HIGH (ref 6–20)
CO2: 24 mmol/L (ref 22–32)
Calcium: 8.2 mg/dL — ABNORMAL LOW (ref 8.9–10.3)
Chloride: 99 mmol/L (ref 98–111)
Creatinine, Ser: 1.02 mg/dL (ref 0.61–1.24)
GFR, Estimated: >60 mL/min (ref >60)
Glucose, Bld: 126 mg/dL — ABNORMAL HIGH (ref 70–99)
Potassium: 4.1 mmol/L (ref 3.5–5.1)
Sodium: 132 mmol/L — ABNORMAL LOW (ref 135–145)
Total Bilirubin: 2.6 mg/dL — ABNORMAL HIGH (ref 0.3–1.2)
Total Protein: 7.8 g/dL (ref 6.5–8.1)

## 2021-04-07 LAB — LACTIC ACID, PLASMA: Lactic Acid, Venous: 1.3 mmol/L (ref 0.5–1.9)

## 2021-04-07 LAB — PROTIME-INR
INR: 1.4 — ABNORMAL HIGH (ref 0.8–1.2)
Prothrombin Time: 16.2 seconds — ABNORMAL HIGH (ref 11.4–15.2)

## 2021-04-07 LAB — TROPONIN I (HIGH SENSITIVITY): Troponin I (High Sensitivity): 4 ng/L (ref <18)

## 2021-04-07 MED ORDER — IOHEXOL 300 MG/ML  SOLN
100.0000 mL | Freq: Once | INTRAMUSCULAR | Status: AC | PRN
  Administered 2021-04-07: 100 mL via INTRAVENOUS

## 2021-04-07 NOTE — ED Provider Notes (Signed)
[facility] Emergency Department Provider Note   ____________________________________________    I have reviewed the triage vital signs and the nursing notes.   HISTORY  Chief Complaint Post-op Problem     HPI Collin Byrd is a 60 y.o. male who had laparoscopic hernia repair performed on April 7 with Dr. Titus Mould.  The patient does have a history of diabetes, CHF.  He reports he has developed redness and pain around the incision site.  He does not think that he has had a fever but he is not sure.  Contacted on-call number and was told to come to the emergency department for evaluation.  Has not take anything for this.  Past Medical History:  Diagnosis Date  . Alcohol abuse   . Ascites   . Cataract   . Cellulitis   . CHF (congestive heart failure) ([place])   . Cirrhosis ([place])   . Diabetes mellitus without complication ([place])   . History of methicillin resistant staphylococcus aureus (MRSA) 2016  . Pneumonia 2021  . Portal hypertension ([place])   . Pulmonary embolus ([place])   . Sepsis ([place])   . Spontaneous bacterial peritonitis [facility])     Patient Active Problem List   Diagnosis Date Noted  . Screening for colon cancer   . History of congestive heart failure 07/12/2020  . Ascites due to alcoholic cirrhosis ([place])   . Type 2 diabetes mellitus with hyperglycemia, without long-term current use of insulin ([place])   . SBP (spontaneous bacterial peritonitis) ([place]) 06/07/2020  . Hepatic cirrhosis ([place]) 06/07/2020  . Alcohol abuse 06/05/2020  . Traumatic dislocation of finger 09/28/2018    Past Surgical History:  Procedure Laterality Date  . CATARACT EXTRACTION    . COLONOSCOPY WITH PROPOFOL N/A 01/03/2021   Procedure: COLONOSCOPY WITH PROPOFOL;  Surgeon: Lin Landsman, MD;  Location: [facility] ENDOSCOPY;  Service: Gastroenterology;  Laterality: N/A;  . ESOPHAGOGASTRODUODENOSCOPY (EGD) WITH PROPOFOL N/A 01/03/2021   Procedure: ESOPHAGOGASTRODUODENOSCOPY  (EGD) WITH PROPOFOL;  Surgeon: Lin Landsman, MD;  Location: [facility] ENDOSCOPY;  Service: Gastroenterology;  Laterality: N/A;  . EYE SURGERY    . PARACENTESIS  2021  . UMBILICAL HERNIA REPAIR N/A 04/04/2021   Procedure: HERNIA REPAIR UMBILICAL ADULT, open;  Surgeon: Jules Husbands, MD;  Location: [facility] ORS;  Service: General;  Laterality: N/A;  . XI ROBOTIC ASSISTED INGUINAL HERNIA REPAIR WITH MESH Left 04/04/2021   Procedure: XI ROBOTIC ASSISTED INGUINAL HERNIA REPAIR WITH MESH;  Surgeon: Jules Husbands, MD;  Location: [facility] ORS;  Service: General;  Laterality: Left;    Prior to Admission medications   Medication Sig Start Date End Date Taking? Authorizing Provider  blood glucose meter kit and supplies KIT Dispense based on patient and insurance preference. Use up to four times daily as directed. (FOR ICD-9 250.00, 250.01). 07/12/20  Yes Iloabachie, Chioma E, NP  enoxaparin (LOVENOX) 40 MG/0.4ML injection Inject 0.4 mLs (40 mg total) into the skin daily. START the day after your surgery. Take for 7 days. 04/03/21  Yes Cammie Sickle, MD  folic acid (FOLVITE) 1 MG tablet TAKE ONE TABLET BY MOUTH EVERY DAY 02/19/21 02/19/22 Yes Iloabachie, Chioma E, NP  furosemide (LASIX) 40 MG tablet TAKE ONE TABLET BY MOUTH EVERY DAY 12/18/20 12/18/21 Yes Hackney, Aura Fey, FNP  omega-3 acid ethyl esters (LOVAZA) 1 g capsule TAKE 2 CAPSULES BY MOUTH IN THE MORNING AND 2 AT BEDTIME Patient taking differently: Take 2 g by mouth 2 (two) times daily. 02/26/21 02/26/22  Yes Iloabachie, Chioma E, NP  oxyCODONE (OXY IR/ROXICODONE) 5 MG immediate release tablet Take 1 tablet (5 mg total) by mouth every 4 (four) hours as needed for severe pain. 04/04/21  Yes Pabon, Diego F, MD  pantoprazole (PROTONIX) 40 MG tablet TAKE ONE TABLET BY MOUTH EVERY DAY Patient taking differently: Take 40 mg by mouth daily. 01/09/21 01/09/22 Yes Iloabachie, Chioma E, NP  sacubitril-valsartan (ENTRESTO) 24-26 MG Take 1 tablet by mouth 2 (two) times daily.    Yes [provider]  spironolactone (ALDACTONE) 100 MG tablet TAKE ONE TABLET BY MOUTH 2 TIMES A DAY. Patient taking differently: Take 100 mg by mouth 2 (two) times daily. TAKE ONE TABLET BY MOUTH 2 TIMES A DAY. 01/31/21 03/28/21 Yes Hackney, Tina A, FNP  thiamine (VITAMIN B-1) 100 MG tablet TAKE ONE TABLET BY MOUTH EVERY DAY Patient taking differently: Take 100 mg by mouth daily. 01/09/21 01/09/22 Yes Iloabachie, Chioma E, NP  amoxicillin (AMOXIL) 500 MG capsule TAKE TWO CAPSULES BY MOUTH 2 TIMES A DAY FOR 14 DAYS Patient not taking: No sig reported 03/01/21 03/01/22  Lin Landsman, MD  artificial tears (LACRILUBE) OINT ophthalmic ointment Place into the right eye 3 (three) times daily. Patient not taking: No sig reported 10/30/20   Iloabachie, Chioma E, NP  clarithromycin (BIAXIN) 500 MG tablet TAKE ONE TABLET BY MOUTH 2 TIMES A DAY FOR 14 DAYS. Patient not taking: No sig reported 03/01/21 04/27/21  Lin Landsman, MD  losartan (COZAAR) 25 MG tablet Take 1 tablet (25 mg total) by mouth daily. Take until entresto shipment comes in. Patient not taking: No sig reported 03/06/21 06/04/21  Alisa Graff, FNP  omeprazole (PRILOSEC) 20 MG capsule TAKE ONE CAPSULE BY MOUTH 2 TIMES A DAY BEFORE A MEAL FOR 14 DAYS Patient not taking: No sig reported 03/01/21 08/28/21  Lin Landsman, MD     Allergies Patient has no known allergies.  History reviewed. No pertinent family history.  Social History Social History   Tobacco Use  . Smoking status: Current Some Day Smoker    Packs/day: 0.25    Types: Cigarettes  . Smokeless tobacco: Never Used  Vaping Use  . Vaping Use: Never used  Substance Use Topics  . Alcohol use: Not Currently  . Drug use: Never    Review of Systems  Constitutional: No fever/chills Eyes: No visual changes.  ENT: No sore throat. Cardiovascular: Denies chest pain. Respiratory: Denies shortness of breath. Gastrointestinal: Increased abdominal  pain Genitourinary: Negative for dysuria. Musculoskeletal: Negative for back pain. Skin: Redness Neurological: Negative for headaches or weakness   ____________________________________________   PHYSICAL EXAM:  VITAL SIGNS: ED Triage Vitals  Enc Vitals Group     BP 04/07/21 2023 108/67     Pulse Rate 04/07/21 2023 97     Resp 04/07/21 2023 17     Temp 04/07/21 2023 99.4 F (37.4 C)     Temp Source 04/07/21 2023 Oral     SpO2 04/07/21 2023 94 %     Weight --      Height --      Head Circumference --      Peak Flow --      Pain Score 04/07/21 2058 10     Pain Loc --      Pain Edu? --      Excl. in [place]? --     Constitutional: Alert and oriented.  Nose: No congestion/rhinnorhea. Mouth/Throat: Mucous membranes are moist.    Cardiovascular: Normal  rate, regular rhythm. Grossly normal heart sounds.  Good peripheral circulation. Respiratory: Normal respiratory effort.  No retractions. Lungs CTAB. Gastrointestinal: Area of erythema surrounding surgical incision site which is intact. No distention.  He does have tenderness overlying the area of erythema    Musculoskeletal: Warm and well perfused Neurologic:  Normal speech and language. No gross focal neurologic deficits are appreciated.  Skin:  Skin is warm, dry and intact.  See above Psychiatric: Mood and affect are normal. Speech and behavior are normal.  ____________________________________________   LABS (all labs ordered are listed, but only abnormal results are displayed)  Labs Reviewed  COMPREHENSIVE METABOLIC PANEL - Abnormal; Notable for the following components:      Result Value   Sodium 132 (*)    Glucose, Bld 126 (*)    BUN 25 (*)    Calcium 8.2 (*)    AST 14 (*)    Total Bilirubin 2.6 (*)    All other components within normal limits  CBC WITH DIFFERENTIAL/PLATELET - Abnormal; Notable for the following components:   WBC 13.1 (*)    RBC 3.36 (*)    Hemoglobin 10.3 (*)    HCT 30.4 (*)    Neutro Abs  10.4 (*)    Monocytes Absolute 1.4 (*)    Abs Immature Granulocytes 0.15 (*)    All other components within normal limits  PROTIME-INR - Abnormal; Notable for the following components:   Prothrombin Time 16.2 (*)    INR 1.4 (*)    All other components within normal limits  CULTURE, BLOOD (ROUTINE X 2)  CULTURE, BLOOD (ROUTINE X 2)  LACTIC ACID, PLASMA  LACTIC ACID, PLASMA  URINALYSIS, COMPLETE (UACMP) WITH MICROSCOPIC  TROPONIN I (HIGH SENSITIVITY)  TROPONIN I (HIGH SENSITIVITY)   ____________________________________________  EKG  ED ECG REPORT I, Lavonia Drafts, the attending physician, personally viewed and interpreted this ECG.  Date: 04/07/2021  Rhythm: normal sinus rhythm QRS Axis: normal Intervals: normal ST/T Wave abnormalities: normal Narrative Interpretation: no evidence of acute ischemia  ____________________________________________  RADIOLOGY  CT abdomen pelvis ____________________________________________   PROCEDURES  Procedure(s) performed: No  Procedures   Critical Care performed: No ____________________________________________   INITIAL IMPRESSION / ASSESSMENT AND PLAN / ED COURSE  Pertinent labs & imaging results that were available during my care of the patient were reviewed by me and considered in my medical decision making (see chart for details).  Patient presents with erythema surrounding incision site status post hernia repair 3 days ago.  Differential includes cellulitis versus deeper abscess/surgical site infection.  Blood cell count is elevated, temperature 99.3, heart rate normal.  We will send for CT abdomen pelvis, have asked my colleague to follow-up on results and discuss with surgery if needed    ____________________________________________   FINAL CLINICAL IMPRESSION(S) / ED DIAGNOSES  Final diagnoses:  Infection of superficial incisional surgical site after procedure, initial encounter        Note:  This  document was prepared using Dragon voice recognition software and may include unintentional dictation errors.   Lavonia Drafts, MD 04/07/21 [id number]

## 2021-04-07 NOTE — ED Triage Notes (Signed)
Pt had hernia surgery on 4/7, by Pabon MD and today pt has had fever and redness noted to the incision site. Denies drainage. Pt directed by on call to come to ED due to infection concerns.

## 2021-04-08 MED ORDER — AMOXICILLIN-POT CLAVULANATE 875-125 MG PO TABS
1.0000 | ORAL_TABLET | Freq: Once | ORAL | Status: AC
  Administered 2021-04-08: 1 via ORAL
  Filled 2021-04-08: qty 1

## 2021-04-08 MED ORDER — AMOXICILLIN-POT CLAVULANATE 875-125 MG PO TABS: 1.0000 | ORAL_TABLET | Freq: Two times a day (BID) | ORAL | 0 refills | Status: DC

## 2021-04-08 NOTE — Progress Notes (Signed)
I was contacted by the EDP, Dr. Beather Arbour, regarding this patient of Dr. Dahlia Byes. He had a robot-assisted left inguinal hernia repair and an open umbilical hernia repair on Thursday. He is cirrhotic, but compensated. He had called the nurse triage line earlier in the evening with concerns regarding a fever and possible wound infection.  Per their algorithm, he was sent to the ED.  Seen by Dr. Corky Downs who placed a photo of the umbilical incision in the chart:    He had leukocytosis to 13.1; CMP with mild hyponatremia, biliruibin of 2.6.  INR was 1.4. Lactic acid normal at 1.3. A CT scan was obtained to rule out a deeper infection; none was seen, but the hernia appears to have recurred. I personally reviewed the images and compared them to the pre-operative CT scan. It does appear that the repair has not held, however there does not appear to be any indication for emergent re-operation.  I recommended to Dr. Beather Arbour that she initiate Augmentin for cellulitis and to have the patient contact our surgical office in the morning.  I will also communicate this information to Dr. Dahlia Byes, who performed the procedure.

## 2021-04-08 NOTE — ED Provider Notes (Signed)
-----------------------------------------   12:09 AM on 04/08/2021 -----------------------------------------  CT abdomen/pelvis interpreted per Dr. Joneen Caraway:  1. Large indirect left inguinal hernia with sigmoid colon extending  into the mouth of the hernia and sigmoid mesocolon and fluid  extending into the hernia.  2. Multiple separate collections of fat in the hernia more distally  with some twisting of the associated vessels in the distal aspect of  the hernia in the upper scrotum. These could represent areas of fat  necrosis.  3. Postsurgical soft tissue stranding and air or gas in the  subcutaneous fat on either side of the umbilicus and extending into  the underlying anterior peritoneal fat.  4. Stable changes of cirrhosis of the liver.  5. Moderately enlarged prostate gland.   Discussed case and CT scan with surgery on-call Dr. Celine Ahr who recommends placing patient on Augmentin and follow-up in office later today.  Strict return precautions given.  Patient verbalizes understanding agrees with plan of care.   Paulette Blanch, MD 04/08/21 [phone]

## 2021-04-08 NOTE — Discharge Instructions (Signed)
Start antibiotic as prescribed (Augmentin 875mg  twice daily x7 days).  Call Dr. Corlis Leak office this morning for appointment to be seen today.  Return to the ER for worsening symptoms, persistent vomiting, fever or other concerns

## 2021-04-10 ENCOUNTER — Other Ambulatory Visit: Payer: Self-pay

## 2021-04-10 ENCOUNTER — Telehealth: Payer: Self-pay | Admitting: Surgery

## 2021-04-10 ENCOUNTER — Encounter: Payer: Self-pay | Admitting: Surgery

## 2021-04-10 ENCOUNTER — Ambulatory Visit (INDEPENDENT_AMBULATORY_CARE_PROVIDER_SITE_OTHER): Payer: Self-pay | Admitting: Surgery

## 2021-04-10 VITALS — BP 93/62 | HR 93 | Temp 98.5°F | Ht 67.0 in | Wt 189.0 lb

## 2021-04-10 DIAGNOSIS — K4030 Unilateral inguinal hernia, with obstruction, without gangrene, not specified as recurrent: Secondary | ICD-10-CM

## 2021-04-10 NOTE — Telephone Encounter (Signed)
This is a next day add on for surgery.  Patient informed of the following below, Dr. Dahlia Byes is used for interpretation.  Patient voices understanding with all.   Pt has been advised of Pre-Admission date/time, COVID Testing date and Surgery date.  Surgery Date: 04/11/21 Preadmission Testing Date: 04/11/21 patient to arrive at 10:00 am on 04/11/21 for pre-admit screening and Covid testing and to same day surgery after.  Patient instructed not to eat or drink after midnight.  Patient voices understanding.

## 2021-04-10 NOTE — H&P (View-Only) (Signed)
Outpatient Surgical Follow Up  04/10/2021  Collin Byrd is an 60 y.o. male.   Chief Complaint  Patient presents with  . 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  He did well postoperatively but the weekend came over with more pain.  And he did have a CT scan of the abdomen pelvis that I personally reviewed showing early recurrence of the left inguinal hernia.  There is no evidence strangulation or incarceration.  The patient is doing otherwise well.  He is walking on his own he is eating he is passing gas.  No fevers no chills.  He was placed on therapeutic Lovenox given the history of PE.  His last shot was yesterday  Past Medical History:  Diagnosis Date  . Alcohol abuse   . Ascites   . Cataract   . Cellulitis   . CHF (congestive heart failure) ([place])   . Cirrhosis ([place])   . Diabetes mellitus without complication ([place])   . History of methicillin resistant staphylococcus aureus (MRSA) 2016  . Pneumonia 2021  . Portal hypertension ([place])   . Pulmonary embolus ([place])   . Sepsis ([place])   . Spontaneous bacterial peritonitis [facility])     Past Surgical History:  Procedure Laterality Date  . CATARACT EXTRACTION    . COLONOSCOPY WITH PROPOFOL N/A 01/03/2021   Procedure: COLONOSCOPY WITH PROPOFOL;  Surgeon: Lin Landsman, MD;  Location: [facility] ENDOSCOPY;  Service: Gastroenterology;  Laterality: N/A;  . ESOPHAGOGASTRODUODENOSCOPY (EGD) WITH PROPOFOL N/A 01/03/2021   Procedure: ESOPHAGOGASTRODUODENOSCOPY (EGD) WITH PROPOFOL;  Surgeon: Lin Landsman, MD;  Location: [facility] ENDOSCOPY;  Service: Gastroenterology;  Laterality: N/A;  . EYE SURGERY    . PARACENTESIS  2021  . UMBILICAL HERNIA REPAIR N/A 04/04/2021   Procedure: HERNIA REPAIR UMBILICAL ADULT, open;  Surgeon: Jules Husbands, MD;  Location: [facility] ORS;  Service:  General;  Laterality: N/A;  . XI ROBOTIC ASSISTED INGUINAL HERNIA REPAIR WITH MESH Left 04/04/2021   Procedure: XI ROBOTIC ASSISTED INGUINAL HERNIA REPAIR WITH MESH;  Surgeon: Jules Husbands, MD;  Location: [facility] ORS;  Service: General;  Laterality: Left;    No family history on file.  Social History:  reports that he has been smoking cigarettes. He has been smoking about 0.25 packs per day. He has never used smokeless tobacco. He reports previous alcohol use. He reports that he does not use drugs.  Allergies: No Known Allergies  Medications reviewed.    ROS Full ROS performed and is otherwise negative other than what is stated in HPI   BP 93/62   Pulse 93   Temp 98.5 F (36.9 C)   Ht 5\' 7"  (1.702 m)   Wt 189 lb (85.7 kg)   SpO2 95%   BMI 29.60 kg/m   Physical Exam Vitals and nursing note reviewed. Exam conducted with a chaperone present.  Constitutional:      General: He is not in acute distress.    Appearance: Normal appearance. He is normal weight.  Pulmonary:     Effort: Pulmonary effort is normal. No respiratory distress.     Breath sounds: Normal breath sounds. No stridor.  Abdominal:     General: Abdomen is flat. There is no distension.     Palpations: Abdomen is soft. There is no mass.  Tenderness: There is no abdominal tenderness. There is no guarding or rebound.     Hernia: A hernia is present.     Comments: There is a recurrent left inguinal hernia, no peritonitis, Tender w/o signs of strangulation. Abdominal incisions c/d/i, nop infection, no hematomas  Musculoskeletal:     Cervical back: Normal range of motion and neck supple. No rigidity or tenderness.  Skin:    General: Skin is warm and dry.     Capillary Refill: Capillary refill takes less than 2 seconds.  Neurological:     General: No focal deficit present.     Mental Status: He is alert and oriented to person, place, and time.  Psychiatric:        Mood and Affect: Mood normal.        Behavior:  Behavior normal.        Thought Content: Thought content normal.        Judgment: Judgment normal.      Assessment/Plan: Difficult situation with an area of recurrent giant left inguinal hernia on a cirrhotic patient.  I do think that the best option for him is to perform early redo hernia repair in an open fashion.  He is compensated and I do not think that waiting will at anything.  Waiting might even complicate things and make strangulation possible.  Patient is nontoxic does not require admission to the hospital.  We will do him tomorrow as an open fashion.  I have asked him to hold his Lovenox therapeutically.  Procedure discussed with patient in detail.  Risks, benefits and possible occasions including but not limited to: Bleeding, infection bowel injuries.  He understands and wished to proceed.  He understands that this is a complicated scenario given his complexity of his medical condition and the complexity of this massive hernia that I tried to do laparoscopically but unfortunately recurred. Extensive counseling provided    Caroleen Hamman, MD [facility] General Surgeon

## 2021-04-10 NOTE — Progress Notes (Signed)
Outpatient Surgical Follow Up  04/10/2021  Collin Byrd is an 60 y.o. male.   Chief Complaint  Patient presents with  . 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  He did well postoperatively but the weekend came over with more pain.  And he did have a CT scan of the abdomen pelvis that I personally reviewed showing early recurrence of the left inguinal hernia.  There is no evidence strangulation or incarceration.  The patient is doing otherwise well.  He is walking on his own he is eating he is passing gas.  No fevers no chills.  He was placed on therapeutic Lovenox given the history of PE.  His last shot was yesterday  Past Medical History:  Diagnosis Date  . Alcohol abuse   . Ascites   . Cataract   . Cellulitis   . CHF (congestive heart failure) ([place])   . Cirrhosis ([place])   . Diabetes mellitus without complication ([place])   . History of methicillin resistant staphylococcus aureus (MRSA) 2016  . Pneumonia 2021  . Portal hypertension ([place])   . Pulmonary embolus ([place])   . Sepsis ([place])   . Spontaneous bacterial peritonitis [facility])     Past Surgical History:  Procedure Laterality Date  . CATARACT EXTRACTION    . COLONOSCOPY WITH PROPOFOL N/A 01/03/2021   Procedure: COLONOSCOPY WITH PROPOFOL;  Surgeon: Lin Landsman, MD;  Location: [facility] ENDOSCOPY;  Service: Gastroenterology;  Laterality: N/A;  . ESOPHAGOGASTRODUODENOSCOPY (EGD) WITH PROPOFOL N/A 01/03/2021   Procedure: ESOPHAGOGASTRODUODENOSCOPY (EGD) WITH PROPOFOL;  Surgeon: Lin Landsman, MD;  Location: [facility] ENDOSCOPY;  Service: Gastroenterology;  Laterality: N/A;  . EYE SURGERY    . PARACENTESIS  2021  . UMBILICAL HERNIA REPAIR N/A 04/04/2021   Procedure: HERNIA REPAIR UMBILICAL ADULT, open;  Surgeon: Jules Husbands, MD;  Location: [facility] ORS;  Service:  General;  Laterality: N/A;  . XI ROBOTIC ASSISTED INGUINAL HERNIA REPAIR WITH MESH Left 04/04/2021   Procedure: XI ROBOTIC ASSISTED INGUINAL HERNIA REPAIR WITH MESH;  Surgeon: Jules Husbands, MD;  Location: [facility] ORS;  Service: General;  Laterality: Left;    No family history on file.  Social History:  reports that he has been smoking cigarettes. He has been smoking about 0.25 packs per day. He has never used smokeless tobacco. He reports previous alcohol use. He reports that he does not use drugs.  Allergies: No Known Allergies  Medications reviewed.    ROS Full ROS performed and is otherwise negative other than what is stated in HPI   BP 93/62   Pulse 93   Temp 98.5 F (36.9 C)   Ht 5\' 7"  (1.702 m)   Wt 189 lb (85.7 kg)   SpO2 95%   BMI 29.60 kg/m   Physical Exam Vitals and nursing note reviewed. Exam conducted with a chaperone present.  Constitutional:      General: He is not in acute distress.    Appearance: Normal appearance. He is normal weight.  Pulmonary:     Effort: Pulmonary effort is normal. No respiratory distress.     Breath sounds: Normal breath sounds. No stridor.  Abdominal:     General: Abdomen is flat. There is no distension.     Palpations: Abdomen is soft. There is no mass.  Tenderness: There is no abdominal tenderness. There is no guarding or rebound.     Hernia: A hernia is present.     Comments: There is a recurrent left inguinal hernia, no peritonitis, Tender w/o signs of strangulation. Abdominal incisions c/d/i, nop infection, no hematomas  Musculoskeletal:     Cervical back: Normal range of motion and neck supple. No rigidity or tenderness.  Skin:    General: Skin is warm and dry.     Capillary Refill: Capillary refill takes less than 2 seconds.  Neurological:     General: No focal deficit present.     Mental Status: He is alert and oriented to person, place, and time.  Psychiatric:        Mood and Affect: Mood normal.        Behavior:  Behavior normal.        Thought Content: Thought content normal.        Judgment: Judgment normal.      Assessment/Plan: Difficult situation with an area of recurrent giant left inguinal hernia on a cirrhotic patient.  I do think that the best option for him is to perform early redo hernia repair in an open fashion.  He is compensated and I do not think that waiting will at anything.  Waiting might even complicate things and make strangulation possible.  Patient is nontoxic does not require admission to the hospital.  We will do him tomorrow as an open fashion.  I have asked him to hold his Lovenox therapeutically.  Procedure discussed with patient in detail.  Risks, benefits and possible occasions including but not limited to: Bleeding, infection bowel injuries.  He understands and wished to proceed.  He understands that this is a complicated scenario given his complexity of his medical condition and the complexity of this massive hernia that I tried to do laparoscopically but unfortunately recurred. Extensive counseling provided    Caroleen Hamman, MD [facility] General Surgeon

## 2021-04-10 NOTE — Patient Instructions (Addendum)
We will get you schedule for surgery.

## 2021-04-11 ENCOUNTER — Ambulatory Visit: Payer: Medicaid Other | Admitting: Anesthesiology

## 2021-04-11 ENCOUNTER — Observation Stay
Admission: RE | Admit: 2021-04-11 | Discharge: 2021-04-12 | Disposition: A | Discharge status: Home / Self Care | Payer: Medicaid Other | Attending: Surgery | Admitting: Surgery

## 2021-04-11 ENCOUNTER — Encounter: Payer: Self-pay | Admitting: Surgery

## 2021-04-11 ENCOUNTER — Encounter
Admission: RE | Disposition: A | Discharge status: Home / Self Care | Payer: Self-pay | Source: Home / Self Care | Attending: Surgery

## 2021-04-11 DIAGNOSIS — I110 Hypertensive heart disease with heart failure: Secondary | ICD-10-CM | POA: Diagnosis not present

## 2021-04-11 DIAGNOSIS — K4031 Unilateral inguinal hernia, with obstruction, without gangrene, recurrent: Principal | ICD-10-CM | POA: Insufficient documentation

## 2021-04-11 DIAGNOSIS — Z8719 Personal history of other diseases of the digestive system: Secondary | ICD-10-CM

## 2021-04-11 DIAGNOSIS — K4091 Unilateral inguinal hernia, without obstruction or gangrene, recurrent: Secondary | ICD-10-CM | POA: Diagnosis present

## 2021-04-11 DIAGNOSIS — F17210 Nicotine dependence, cigarettes, uncomplicated: Secondary | ICD-10-CM | POA: Diagnosis not present

## 2021-04-11 DIAGNOSIS — I509 Heart failure, unspecified: Secondary | ICD-10-CM | POA: Insufficient documentation

## 2021-04-11 DIAGNOSIS — Z5331 Laparoscopic surgical procedure converted to open procedure: Secondary | ICD-10-CM

## 2021-04-11 DIAGNOSIS — N5089 Other specified disorders of the male genital organs: Secondary | ICD-10-CM | POA: Diagnosis not present

## 2021-04-11 DIAGNOSIS — K4030 Unilateral inguinal hernia, with obstruction, without gangrene, not specified as recurrent: Secondary | ICD-10-CM

## 2021-04-11 DIAGNOSIS — Z9889 Other specified postprocedural states: Secondary | ICD-10-CM

## 2021-04-11 DIAGNOSIS — E119 Type 2 diabetes mellitus without complications: Secondary | ICD-10-CM | POA: Insufficient documentation

## 2021-04-11 DIAGNOSIS — Z20822 Contact with and (suspected) exposure to covid-19: Secondary | ICD-10-CM | POA: Insufficient documentation

## 2021-04-11 HISTORY — PX: INGUINAL HERNIA REPAIR: SHX194

## 2021-04-11 HISTORY — PX: TESTICULAR EXPLORATION: SHX5145

## 2021-04-11 LAB — CBC
HCT: 26.7 % — ABNORMAL LOW (ref 39.0–52.0)
Hemoglobin: 8.9 g/dL — ABNORMAL LOW (ref 13.0–17.0)
MCH: 30.7 pg (ref 26.0–34.0)
MCHC: 33.3 g/dL (ref 30.0–36.0)
MCV: 92.1 fL (ref 80.0–100.0)
Platelets: 307 10*3/uL (ref 150–400)
RBC: 2.9 MIL/uL — ABNORMAL LOW (ref 4.22–5.81)
RDW: 14.3 % (ref 11.5–15.5)
WBC: 14 10*3/uL — ABNORMAL HIGH (ref 4.0–10.5)
nRBC: 0 % (ref 0.0–0.2)

## 2021-04-11 LAB — RESP PANEL BY RT-PCR (FLU A&B, COVID) ARPGX2
Influenza A by PCR: NEGATIVE
Influenza B by PCR: NEGATIVE
SARS Coronavirus 2 by RT PCR: NEGATIVE

## 2021-04-11 LAB — GLUCOSE, CAPILLARY
Glucose-Capillary: 111 mg/dL — ABNORMAL HIGH (ref 70–99)
Glucose-Capillary: 173 mg/dL — ABNORMAL HIGH (ref 70–99)
Glucose-Capillary: 177 mg/dL — ABNORMAL HIGH (ref 70–99)

## 2021-04-11 LAB — CREATININE, SERUM
Creatinine, Ser: 0.98 mg/dL (ref 0.61–1.24)
GFR, Estimated: >60 mL/min (ref >60)

## 2021-04-11 SURGERY — REPAIR, HERNIA, INGUINAL, ADULT
Anesthesia: General | Laterality: Left

## 2021-04-11 MED ORDER — ROCURONIUM BROMIDE 10 MG/ML (PF) SYRINGE
PREFILLED_SYRINGE | INTRAVENOUS | Status: AC
  Filled 2021-04-11: qty 10

## 2021-04-11 MED ORDER — CHLORHEXIDINE GLUCONATE CLOTH 2 % EX PADS: 6.0000 | MEDICATED_PAD | Freq: Once | CUTANEOUS | Status: DC

## 2021-04-11 MED ORDER — SODIUM CHLORIDE 0.9 % IV SOLN: INTRAVENOUS | Status: DC

## 2021-04-11 MED ORDER — HYDROMORPHONE HCL 1 MG/ML IJ SOLN
INTRAMUSCULAR | Status: DC | PRN
  Administered 2021-04-11 (×2): .5 mg via INTRAVENOUS

## 2021-04-11 MED ORDER — FENTANYL CITRATE (PF) 100 MCG/2ML IJ SOLN
INTRAMUSCULAR | Status: DC | PRN
  Administered 2021-04-11: 50 ug via INTRAVENOUS
  Administered 2021-04-11: 100 ug via INTRAVENOUS
  Administered 2021-04-11: 50 ug via INTRAVENOUS

## 2021-04-11 MED ORDER — PROCHLORPERAZINE EDISYLATE 10 MG/2ML IJ SOLN
5.0000 mg | Freq: Four times a day (QID) | INTRAMUSCULAR | Status: DC | PRN
Start: 2021-04-11 — End: 2021-04-13

## 2021-04-11 MED ORDER — VISTASEAL 10 ML SINGLE DOSE KIT
PACK | CUTANEOUS | Status: AC
  Filled 2021-04-11: qty 10

## 2021-04-11 MED ORDER — DEXAMETHASONE SODIUM PHOSPHATE 10 MG/ML IJ SOLN
INTRAMUSCULAR | Status: AC
  Filled 2021-04-11: qty 1

## 2021-04-11 MED ORDER — SUGAMMADEX SODIUM 200 MG/2ML IV SOLN
INTRAVENOUS | Status: DC | PRN
  Administered 2021-04-11: 200 mg via INTRAVENOUS

## 2021-04-11 MED ORDER — KETOROLAC TROMETHAMINE 15 MG/ML IJ SOLN
15.0000 mg | Freq: Four times a day (QID) | INTRAMUSCULAR | Status: DC
  Administered 2021-04-12 (×4): 15 mg via INTRAVENOUS
  Filled 2021-04-11 (×4): qty 1

## 2021-04-11 MED ORDER — MORPHINE SULFATE (PF) 2 MG/ML IV SOLN: 2.0000 mg | INTRAVENOUS | Status: DC | PRN

## 2021-04-11 MED ORDER — PROCHLORPERAZINE MALEATE 10 MG PO TABS
10.0000 mg | ORAL_TABLET | Freq: Four times a day (QID) | ORAL | Status: DC | PRN
  Filled 2021-04-11: qty 1

## 2021-04-11 MED ORDER — CEFAZOLIN SODIUM-DEXTROSE 2-4 GM/100ML-% IV SOLN
2.0000 g | Freq: Three times a day (TID) | INTRAVENOUS | Status: AC
  Administered 2021-04-12 (×2): 2 g via INTRAVENOUS
  Filled 2021-04-11 (×3): qty 100

## 2021-04-11 MED ORDER — BUPIVACAINE-EPINEPHRINE (PF) 0.25% -1:200000 IJ SOLN
INTRAMUSCULAR | Status: AC
  Filled 2021-04-11: qty 30

## 2021-04-11 MED ORDER — CHLORHEXIDINE GLUCONATE 0.12 % MT SOLN
OROMUCOSAL | Status: AC
  Filled 2021-04-11: qty 15

## 2021-04-11 MED ORDER — INDOCYANINE GREEN 25 MG IV SOLR
INTRAVENOUS | Status: DC | PRN
  Administered 2021-04-11: 5 mg via INTRAVENOUS

## 2021-04-11 MED ORDER — ACETAMINOPHEN 160 MG/5ML PO SOLN
325.0000 mg | ORAL | Status: DC | PRN
  Filled 2021-04-11: qty 20.3

## 2021-04-11 MED ORDER — MIDAZOLAM HCL 2 MG/2ML IJ SOLN
INTRAMUSCULAR | Status: AC
  Filled 2021-04-11: qty 2

## 2021-04-11 MED ORDER — ROCURONIUM BROMIDE 100 MG/10ML IV SOLN
INTRAVENOUS | Status: DC | PRN
  Administered 2021-04-11 (×2): 20 mg via INTRAVENOUS
  Administered 2021-04-11: 10 mg via INTRAVENOUS
  Administered 2021-04-11: 50 mg via INTRAVENOUS
  Administered 2021-04-11: 40 mg via INTRAVENOUS
  Administered 2021-04-11: 20 mg via INTRAVENOUS

## 2021-04-11 MED ORDER — LIDOCAINE HCL (CARDIAC) PF 100 MG/5ML IV SOSY
PREFILLED_SYRINGE | INTRAVENOUS | Status: DC | PRN
  Administered 2021-04-11: 100 mg via INTRAVENOUS

## 2021-04-11 MED ORDER — DIPHENHYDRAMINE HCL 50 MG/ML IJ SOLN: 12.5000 mg | Freq: Four times a day (QID) | INTRAMUSCULAR | Status: DC | PRN

## 2021-04-11 MED ORDER — CEFAZOLIN SODIUM-DEXTROSE 2-4 GM/100ML-% IV SOLN
INTRAVENOUS | Status: AC
  Administered 2021-04-12: 2 g via INTRAVENOUS
  Filled 2021-04-11: qty 100

## 2021-04-11 MED ORDER — OXYCODONE HCL 5 MG PO TABS: 5.0000 mg | ORAL_TABLET | ORAL | Status: DC | PRN

## 2021-04-11 MED ORDER — SODIUM CHLORIDE (PF) 0.9 % IJ SOLN
INTRAMUSCULAR | Status: AC
  Filled 2021-04-11: qty 20

## 2021-04-11 MED ORDER — ONDANSETRON HCL 4 MG/2ML IJ SOLN
INTRAMUSCULAR | Status: AC
  Filled 2021-04-11: qty 2

## 2021-04-11 MED ORDER — SEVOFLURANE IN SOLN
RESPIRATORY_TRACT | Status: AC
  Filled 2021-04-11: qty 250

## 2021-04-11 MED ORDER — DEXAMETHASONE SODIUM PHOSPHATE 10 MG/ML IJ SOLN
INTRAMUSCULAR | Status: DC | PRN
  Administered 2021-04-11: 5 mg via INTRAVENOUS

## 2021-04-11 MED ORDER — GABAPENTIN 300 MG PO CAPS
ORAL_CAPSULE | ORAL | Status: AC
  Administered 2021-04-11: 300 mg via ORAL
  Filled 2021-04-11: qty 1

## 2021-04-11 MED ORDER — DIPHENHYDRAMINE HCL 12.5 MG/5ML PO ELIX
12.5000 mg | ORAL_SOLUTION | Freq: Four times a day (QID) | ORAL | Status: DC | PRN
  Filled 2021-04-11: qty 5

## 2021-04-11 MED ORDER — VISTASEAL 10 ML SINGLE DOSE KIT
PACK | CUTANEOUS | Status: DC | PRN
  Administered 2021-04-11: 10 mL via TOPICAL

## 2021-04-11 MED ORDER — OXYCODONE-ACETAMINOPHEN 7.5-325 MG PO TABS: 1.0000 | ORAL_TABLET | Freq: Once | ORAL | Status: DC

## 2021-04-11 MED ORDER — PREGABALIN 50 MG PO CAPS
100.0000 mg | ORAL_CAPSULE | Freq: Two times a day (BID) | ORAL | Status: DC
  Administered 2021-04-11 – 2021-04-12 (×2): 100 mg via ORAL
  Filled 2021-04-11 (×2): qty 2

## 2021-04-11 MED ORDER — PHENYLEPHRINE HCL (PRESSORS) 10 MG/ML IV SOLN
INTRAVENOUS | Status: DC | PRN
  Administered 2021-04-11: 25 ug via INTRAVENOUS

## 2021-04-11 MED ORDER — HYDROMORPHONE HCL 1 MG/ML IJ SOLN
INTRAMUSCULAR | Status: AC
  Filled 2021-04-11: qty 1

## 2021-04-11 MED ORDER — OXYCODONE-ACETAMINOPHEN 7.5-325 MG PO TABS
ORAL_TABLET | ORAL | Status: AC
  Administered 2021-04-11: 1 via ORAL
  Filled 2021-04-11: qty 1

## 2021-04-11 MED ORDER — BUPIVACAINE-EPINEPHRINE 0.25% -1:200000 IJ SOLN
INTRAMUSCULAR | Status: DC | PRN
  Administered 2021-04-11: 25 mL

## 2021-04-11 MED ORDER — CELECOXIB 200 MG PO CAPS: 200.0000 mg | ORAL_CAPSULE | ORAL | Status: AC

## 2021-04-11 MED ORDER — PROPOFOL 10 MG/ML IV BOLUS
INTRAVENOUS | Status: DC | PRN
  Administered 2021-04-11: 120 mg via INTRAVENOUS
  Administered 2021-04-11: 50 mg via INTRAVENOUS

## 2021-04-11 MED ORDER — FENTANYL CITRATE (PF) 100 MCG/2ML IJ SOLN
INTRAMUSCULAR | Status: AC
  Filled 2021-04-11: qty 2

## 2021-04-11 MED ORDER — BUPIVACAINE LIPOSOME 1.3 % IJ SUSP
INTRAMUSCULAR | Status: DC | PRN
  Administered 2021-04-11: 20 mL

## 2021-04-11 MED ORDER — OXYCODONE-ACETAMINOPHEN 7.5-325 MG PO TABS: 1.0000 | ORAL_TABLET | Freq: Once | ORAL | Status: AC

## 2021-04-11 MED ORDER — SODIUM CHLORIDE 0.9 % IV SOLN: INTRAVENOUS | Status: DC | PRN

## 2021-04-11 MED ORDER — ACETAMINOPHEN 10 MG/ML IV SOLN
INTRAVENOUS | Status: AC
  Filled 2021-04-11: qty 100

## 2021-04-11 MED ORDER — FENTANYL CITRATE (PF) 100 MCG/2ML IJ SOLN
INTRAMUSCULAR | Status: AC
  Administered 2021-04-11: 25 ug via INTRAVENOUS
  Filled 2021-04-11: qty 2

## 2021-04-11 MED ORDER — CEFAZOLIN SODIUM-DEXTROSE 2-4 GM/100ML-% IV SOLN
2.0000 g | INTRAVENOUS | Status: AC
  Administered 2021-04-11 (×2): 2 g via INTRAVENOUS

## 2021-04-11 MED ORDER — LIDOCAINE HCL (PF) 2 % IJ SOLN
INTRAMUSCULAR | Status: AC
  Filled 2021-04-11: qty 5

## 2021-04-11 MED ORDER — ONDANSETRON HCL 4 MG/2ML IJ SOLN: 4.0000 mg | Freq: Four times a day (QID) | INTRAMUSCULAR | Status: DC | PRN

## 2021-04-11 MED ORDER — ORAL CARE MOUTH RINSE: 15.0000 mL | Freq: Once | OROMUCOSAL | Status: DC

## 2021-04-11 MED ORDER — PIPERACILLIN-TAZOBACTAM 3.375 G IVPB 30 MIN
3.3750 g | Freq: Once | INTRAVENOUS | Status: AC
  Administered 2021-04-11: 3.375 g via INTRAVENOUS
  Filled 2021-04-11: qty 50

## 2021-04-11 MED ORDER — ONDANSETRON 4 MG PO TBDP
4.0000 mg | ORAL_TABLET | Freq: Four times a day (QID) | ORAL | Status: DC | PRN
  Filled 2021-04-11: qty 1

## 2021-04-11 MED ORDER — OXYCODONE HCL 5 MG PO TABS: 5.0000 mg | ORAL_TABLET | Freq: Once | ORAL | Status: DC

## 2021-04-11 MED ORDER — PANTOPRAZOLE SODIUM 40 MG IV SOLR
40.0000 mg | Freq: Every day | INTRAVENOUS | Status: DC
  Administered 2021-04-11: 40 mg via INTRAVENOUS
  Filled 2021-04-11: qty 40

## 2021-04-11 MED ORDER — EPHEDRINE SULFATE 50 MG/ML IJ SOLN
INTRAMUSCULAR | Status: DC | PRN
  Administered 2021-04-11: 5 mg via INTRAVENOUS

## 2021-04-11 MED ORDER — OXYCODONE-ACETAMINOPHEN 5-325 MG PO TABS: 1.0000 | ORAL_TABLET | Freq: Once | ORAL | Status: DC

## 2021-04-11 MED ORDER — SODIUM CHLORIDE 0.45 % IV SOLN: INTRAVENOUS | Status: DC

## 2021-04-11 MED ORDER — PROMETHAZINE HCL 25 MG/ML IJ SOLN: 6.2500 mg | INTRAMUSCULAR | Status: DC | PRN

## 2021-04-11 MED ORDER — ACETAMINOPHEN 325 MG PO TABS: 325.0000 mg | ORAL_TABLET | ORAL | Status: DC | PRN

## 2021-04-11 MED ORDER — FENTANYL CITRATE (PF) 100 MCG/2ML IJ SOLN
25.0000 ug | INTRAMUSCULAR | Status: DC | PRN
  Administered 2021-04-11: 25 ug via INTRAVENOUS
  Administered 2021-04-11: 50 ug via INTRAVENOUS

## 2021-04-11 MED ORDER — SODIUM CHLORIDE 0.9 % IV SOLN
INTRAVENOUS | Status: DC | PRN
  Administered 2021-04-11: 30 ug/min via INTRAVENOUS

## 2021-04-11 MED ORDER — CELECOXIB 200 MG PO CAPS
ORAL_CAPSULE | ORAL | Status: AC
  Administered 2021-04-11: 200 mg via ORAL
  Filled 2021-04-11: qty 1

## 2021-04-11 MED ORDER — MIDAZOLAM HCL 2 MG/2ML IJ SOLN
INTRAMUSCULAR | Status: DC | PRN
  Administered 2021-04-11: 2 mg via INTRAVENOUS

## 2021-04-11 MED ORDER — ENOXAPARIN SODIUM 40 MG/0.4ML ~~LOC~~ SOLN
40.0000 mg | SUBCUTANEOUS | Status: DC
  Administered 2021-04-11: 40 mg via SUBCUTANEOUS
  Filled 2021-04-11: qty 0.4

## 2021-04-11 MED ORDER — GABAPENTIN 300 MG PO CAPS: 300.0000 mg | ORAL_CAPSULE | ORAL | Status: AC

## 2021-04-11 MED ORDER — CHLORHEXIDINE GLUCONATE 0.12 % MT SOLN: 15.0000 mL | Freq: Once | OROMUCOSAL | Status: DC

## 2021-04-11 MED ORDER — DEXMEDETOMIDINE (PRECEDEX) IN NS 20 MCG/5ML (4 MCG/ML) IV SYRINGE
PREFILLED_SYRINGE | INTRAVENOUS | Status: DC | PRN
  Administered 2021-04-11: 8 ug via INTRAVENOUS

## 2021-04-11 MED ORDER — BUPIVACAINE LIPOSOME 1.3 % IJ SUSP
INTRAMUSCULAR | Status: AC
  Filled 2021-04-11: qty 20

## 2021-04-11 MED ORDER — CEFAZOLIN SODIUM 1 G IJ SOLR
INTRAMUSCULAR | Status: AC
  Filled 2021-04-11: qty 20

## 2021-04-11 MED ORDER — HYDROCODONE-ACETAMINOPHEN 7.5-325 MG PO TABS: 1.0000 | ORAL_TABLET | Freq: Once | ORAL | Status: DC | PRN

## 2021-04-11 MED ORDER — ONDANSETRON HCL 4 MG/2ML IJ SOLN
INTRAMUSCULAR | Status: DC | PRN
  Administered 2021-04-11: 4 mg via INTRAVENOUS

## 2021-04-11 MED ORDER — PIPERACILLIN-TAZOBACTAM 3.375 G IVPB
INTRAVENOUS | Status: AC
  Filled 2021-04-11: qty 50

## 2021-04-11 SURGICAL SUPPLY — 66 items
3-0 VICRYL ×2 IMPLANT
BLADE CLIPPER SURG (BLADE) ×2 IMPLANT
BULB RESERV EVAC DRAIN JP 100C (MISCELLANEOUS) ×2 IMPLANT
CHLORAPREP W/TINT 26 (MISCELLANEOUS) ×2 IMPLANT
COVER TIP SHEARS 8 DVNC (MISCELLANEOUS) ×1 IMPLANT
COVER TIP SHEARS 8MM DA VINCI (MISCELLANEOUS) ×1
COVER WAND RF STERILE (DRAPES) ×2 IMPLANT
DEFOGGER SCOPE WARMER CLEARIFY (MISCELLANEOUS) ×2 IMPLANT
DERMABOND ADVANCED (GAUZE/BANDAGES/DRESSINGS) ×1
DERMABOND ADVANCED .7 DNX12 (GAUZE/BANDAGES/DRESSINGS) ×1 IMPLANT
DRAIN CHANNEL JP 19F (MISCELLANEOUS) ×2 IMPLANT
DRAIN PENROSE 0.625X18 (DRAIN) ×2 IMPLANT
DRAPE ARM DVNC X/XI (DISPOSABLE) ×4 IMPLANT
DRAPE COLUMN DVNC XI (DISPOSABLE) ×1 IMPLANT
DRAPE DA VINCI XI ARM (DISPOSABLE) ×4
DRAPE DA VINCI XI COLUMN (DISPOSABLE) ×1
DRAPE INCISE IOBAN 66X45 STRL (DRAPES) ×2 IMPLANT
DRAPE LAPAROTOMY 77X122 PED (DRAPES) ×2 IMPLANT
ELECT BLADE 6.5 EXT (BLADE) ×2 IMPLANT
ELECT CAUTERY BLADE 6.4 (BLADE) ×2 IMPLANT
ELECT CAUTERY BLADE TIP 2.5 (TIP) ×2
ELECT REM PT RETURN 9FT ADLT (ELECTROSURGICAL) ×2
ELECTRODE CAUTERY BLDE TIP 2.5 (TIP) ×1 IMPLANT
ELECTRODE REM PT RTRN 9FT ADLT (ELECTROSURGICAL) ×1 IMPLANT
GLOVE SURG ENC MOIS LTX SZ7 (GLOVE) ×2 IMPLANT
GOWN STRL REUS W/ TWL LRG LVL3 (GOWN DISPOSABLE) ×2 IMPLANT
GOWN STRL REUS W/TWL LRG LVL3 (GOWN DISPOSABLE) ×2
HANDLE YANKAUER SUCT BULB TIP (MISCELLANEOUS) ×2 IMPLANT
IRRIGATION STRYKERFLOW (MISCELLANEOUS) ×1 IMPLANT
IRRIGATOR STRYKERFLOW (MISCELLANEOUS) ×2
KIT IMAGING PINPOINTPAQ (MISCELLANEOUS) ×2 IMPLANT
MANIFOLD NEPTUNE II (INSTRUMENTS) ×2 IMPLANT
MESH PHASIX RESORB RECT 25X30 (Mesh General) ×2 IMPLANT
NEEDLE HYPO 22GX1.5 SAFETY (NEEDLE) ×2 IMPLANT
NEEDLE INSUFFLATION 14GA 120MM (NEEDLE) ×2 IMPLANT
NS IRRIG 1000ML POUR BTL (IV SOLUTION) ×2 IMPLANT
OBTURATOR OPTICAL STANDARD 8MM (TROCAR) ×1
OBTURATOR OPTICAL STND 8 DVNC (TROCAR) ×1
OBTURATOR OPTICALSTD 8 DVNC (TROCAR) ×1 IMPLANT
PACK BASIN MINOR ARMC (MISCELLANEOUS) ×2 IMPLANT
RELOAD STAPLER WHITE 60MM (STAPLE) ×2 IMPLANT
SEAL CANN UNIV 5-8 DVNC XI (MISCELLANEOUS) ×2 IMPLANT
SEAL XI 5MM-8MM UNIVERSAL (MISCELLANEOUS) ×2
SET TUBE SMOKE EVAC HIGH FLOW (TUBING) ×4 IMPLANT
SOL PREP POV-IOD 4OZ 10% (MISCELLANEOUS) ×4 IMPLANT
SOLUTION ELECTROLUBE (MISCELLANEOUS) ×2 IMPLANT
SPONGE KITTNER 5P (MISCELLANEOUS) ×2 IMPLANT
SPONGE LAP 18X18 RF (DISPOSABLE) ×10 IMPLANT
SPONGE LAP 4X18 RFD (DISPOSABLE) ×4 IMPLANT
STAPLE ECHEON FLEX 60 POW ENDO (STAPLE) ×2 IMPLANT
STAPLER CANNULA SEAL DVNC XI (STAPLE) ×1 IMPLANT
STAPLER CANNULA SEAL XI (STAPLE) ×1
STAPLER RELOAD WHITE 60MM (STAPLE) ×4
SUT ETHIBOND NAB MO 7 #0 18IN (SUTURE) ×6 IMPLANT
SUT MNCRL AB 4-0 PS2 18 (SUTURE) IMPLANT
SUT PDS AB 0 CT1 27 (SUTURE) ×2 IMPLANT
SUT VIC AB 0 CT1 36 (SUTURE) ×4 IMPLANT
SUT VIC AB 3-0 54X BRD REEL (SUTURE) IMPLANT
SUT VIC AB 3-0 BRD 54 (SUTURE)
SUT VIC AB 3-0 SH 27 (SUTURE) ×1
SUT VIC AB 3-0 SH 27X BRD (SUTURE) ×1 IMPLANT
SUT VICRYL 3-0 27IN (SUTURE) ×2 IMPLANT
SUT VLOC 90 S/L VL9 GS22 (SUTURE) ×2 IMPLANT
SYR 20ML LL LF (SYRINGE) ×2 IMPLANT
TROCAR XCEL NON-BLD 5MMX100MML (ENDOMECHANICALS) ×2 IMPLANT
TUBING CONNECTING 10 (TUBING) ×2 IMPLANT

## 2021-04-11 NOTE — Anesthesia Postprocedure Evaluation (Signed)
Anesthesia Post Note  Patient: Collin Byrd  Procedure(s) Performed: HERNIA REPAIR INGUINAL ADULT, open, WITH DIAGNOSTIC LAPAROSCOPY, AND ROBOT ASSISTED (Left ) TESTICULAR EXPLORATION AND REMOVAL OF LEFT TESTICLE (Left )  Patient location during evaluation: PACU Anesthesia Type: General Level of consciousness: awake and alert Pain management: pain level controlled Vital Signs Assessment: post-procedure vital signs reviewed and stable Respiratory status: spontaneous breathing, nonlabored ventilation, respiratory function stable and patient connected to nasal cannula oxygen Cardiovascular status: blood pressure returned to baseline and stable Postop Assessment: no apparent nausea or vomiting Anesthetic complications: no   No complications documented.   Last Vitals:  Vitals:   04/11/21 2044 04/11/21 2107  BP: 100/67 103/66  Pulse: 83 85  Resp: 18 16  Temp: 36.9 C 36.6 C  SpO2: 99% 98%    Last Pain:  Vitals:   04/11/21 2107  TempSrc: Oral  PainSc: 6                  Arita Miss

## 2021-04-11 NOTE — Op Note (Signed)
Date of procedure: 04/11/21  Preoperative diagnosis:  1. Left inguinal hernia  Postoperative diagnosis:  1. Left inguinal hernia 2. Necrotic left testicle  Procedure: 1. Left radical orchiectomy  Surgeon: Nickolas Madrid, MD  Anesthesia: General  Complications: None  Intraoperative findings:  1.  Dusky and non-viable appearing left testicle  EBL: Minimal for urology portion  Specimens: Left testicle and spermatic cord  Drains: None for urology portion  Indication: Collin Byrd is a 60 y.o. patient with a number of comorbidities including cirrhosis who had a chronic large left inguinal hernia repair last week by Dr. Dahlia Byes.  He had a recurrence and was taken to the OR today for exploration and repair.  During the course of the surgery, Dr. Dahlia Byes entered the tunica vaginalis and the testicle appeared dusky and urology was consulted intraoperatively.  Description of procedure:  When I arrived there was a large horizontal incision in the left groin with the left testicle on the field and the tunica vaginalis had been closed by general surgery with a running Vicryl stitch.  They had docked the robot as well to better identify structures intra-abdominally and confirm all bowel was out of the hernia and plan for repair with mesh.  I started by removing the running Vicryl stitch that had closed the tunica vaginalis.  Within, the testicle appeared dusky and non-viable, and the epididymis was dark maroon in color and edematous.  The cord structures and tunica vaginalis were extremely fibrotic and difficult to identify in the setting of his multiple surgeries and inflammation.  I was very concerned for the viability of the testicle long-term, and with his comorbidities I did not think that an attempted salvage or replacing the testicle into the scrotal sac was a viable plan and would be at very high risk for subsequent necrosis, abscess, and pain.  At this point, Dr. Dahlia Byes contacted the  patient's point of contact Renaldo Harrison and she consented to left-sided orchiectomy.  This conversation was witnessed by myself and Einar Crow, RN.  I was able to divide the cord structures into a single packet and this was taken with a 60 mm vascular load.  The residual thickened tunica vaginalis that was fibrotic and inflamed was also taken with a vascular load.  The left testicle and spermatic cord was sent for permanent in the setting of the significant edema and abnormal tissue.    The case was returned back to general surgery for attempted repair of the left inguinal defect.  Disposition: Stable to PACU  Plan: Will follow up pathology Rest of care per general surgery  Nickolas Madrid, MD

## 2021-04-11 NOTE — Op Note (Addendum)
Diagnostic laparoscopy Robotic asssited Open Inguinal hernia repair Recurrent with Phasix Mesh Adjacent Tissue transfer Myocutaneous flap to be able to close Giant Left Inguinal defect measuring 270 cm2 ( 15x18cm defect) Removal of mesh abdominal wall   Pre-operative Diagnosis: Recurrent incarcerated Left inguinal hernia  Post-operative Diagnosis: same  Surgeon: Caroleen Hamman, MD FACS  Asssitant: Dr. Hampton Abbot, Required for exposure and due to the complexity of the case  Anesthesia: Gen. with endotracheal tube  Findings: Incarcerated Recurrent Left inguinal hernia viable sigmoid colon, incarcerated cord lipoma, epiploic appendix of sigmoid, Evidence of friable left testicle, I called Dr. Diamantina Providence intraoperatively to assess the testicle and the need for orchiectomy   Estimated Blood Loss: 100cc       Specimens:hernia sac            Procedure Details  The patient was seen again in the Holding Room. The benefits, complications, treatment options, and expected outcomes were discussed with the patient. The risks of bleeding, infection, recurrence of symptoms, failure to resolve symptoms, bowel injury, bladder injury and testicular injuries which could require further surgery were reviewed with the patient. The likelihood of improving the patient's symptoms with return to their baseline status is good.  The patient  concurred with the proposed plan, giving informed consent.  The patient was taken to Operating Room, identified  and the procedure verified. A Time Out was held and the above information confirmed.  Prior to the induction of general anesthesia, antibiotic prophylaxis was administered. VTE prophylaxis was in place. General endotracheal anesthesia was then administered and tolerated well. After the induction, the abdomen was prepped with Chloraprep and draped in the sterile fashion. The patient was positioned in the supine position. There was an obvious incarcerated left inguinal hernia  that I was not able to reduce even after induction of general endotracheal anesthesia.  We prepped and draped very whitely and I personally placed a Foley catheter.  We incorporated the penis and the scrotum within repair due to the complexity and recurrence of his hernia.  The first thing I did was perform a diagnostic laparoscopy because I was concerned about the viability of the colon. Using a varies needle Palmer's point was identified and we had positive confirmation of intra-abdominal placement of the needle.  Pneumoperitoneum was obtained without evidence of any hemodynamic compromise.  At this point the diagnosed laparoscopy of renal no evidence of ischemic bowel.  It was obvious that we could not perform the repair laparoscopically.  At this time I went to the left inguinal area and perform a generous inguinal incision.  I extremely careful peeling the layers of the left inguinal region including the external oblique.  There was a complete disruption of the left inguinal anatomy.  There was no really good place to identify.  Upon entering the hernia sac I was able to visualize the sac this was very thick and chronic in nature.  There was no distinction between the left testicle and the hernia sac.  While pilling the hernia sac we entered the tunica vaginalis and the testicle.  This was examined and found to be viable but with some questionable ischemic changes.  At this time I decided to call Dr. Diamantina Providence and that this case was very complex in a patient with cirrhosis, recurrent hernia and reoperation. Given that there was a lot of difficulty in identifying the tissue with the left inguinal region and I wanted to make sure that I was able to assess the sigmoid colon I decided  to bring the robotic platform.  I placed 2 additional 8 mm trochars in the upper abdomen under crystallization.  The robot was brought to the surgical field in a sterile fashion and docked in the standard fashion.  I scrubbed out  and went to the console.  I was able to lyse adhesions and was able to assess the colon.  There was evidence of an obvious recurrence of the hernia with some of the epiploic appendage of the sigmoid colon as well as the cord lipoma going underneath and lateral to the mesh.  I also was able to assess the sigmoid colon and did give IV ICG to assess for its perfusion.  There was no evidence of any ischemia in the colon and he was viable.   The urology team assessed the testicle and deemed that he was the safest thing to do was to perform an orchiectomy. I did call Renaldo Harrison who is the girlfriend who has lived for more than 30 years.  I did explain the situation over the phone and took consent for orchiectomy.  Einar Crow, RN witness that conversation.  See urology operative report for details.  This point Dr. Hampton Abbot was also trying to remove the mesh from the inguinal region.  At this time I was able to use a robotic platform to cut the stitch that was attached to the pubic tubercle.  Piscoya was able to remove the mesh.  I decided since there was significant redundant sac that would imbricate the sac using a 2 OV lock.  I also mobilized the white line of Toldt to make sure that the sigmoid laid in the pelvis and would not go into the inguinal canal again.  I was happy with a diagnostic laparoscopy and with the laparoscopic portion of the procedure.  At this time I joined Dr. Hampton Abbot in the open portion.  We were able to identify the inguinal anatomy and again he was distorted.  We were able to develop a retroperitoneal space behind the pubis and the pelvic bone.  We were also able to dissect into the retroperitoneal space posterior to the rectus abdominal muscle.  We were able to place a 16 x 12 cm mesh Phasix in the retroperitoneal space making sure that the medial portion of the inferior portions were behind the pubic tubercle and the pelvis.  The mesh was secured using Vistaseal glue. We then  were able to perform a myocutaneous flap and we developed a flap between the external oblique and internal oblique this was done circumferentially to an area of 15 x 18 cm.  The idea was to reinforce this flap with an additional piece of mesh.  Musculofascial advancement flaps were performed to facilitate closure of large inguinal hernia defect Able to perform mag of a repair approximated the conjoined tendon to the shelving edge of the inguinal ligament.  We again reinforced this repair with another piece of mesh Another 12 x 16 cm Phasix mesh was used on top of the tissue repair attaching to the.  Cervical medially shelving edge laterally and internal oblique muscle superiorly. Proceeded to place 32 Blake drain within this space given its chronicity.  The drain went to the scrotum as well.  I was able to perform also an ilioinguinal nerve block using liposomal Marcaine.  The external big muscle was closed with a running layer of 2-0 Vicryl and subcutaneous tissue was also approximated with 3-0 Vicryl.  Skin incisions were closed with 4-0 Monocryl in a  subcuticular fashion.  Dermabond was applied to all skin incisions.   The patient was then extubated and brought to the recovery room in stable condition. Sponge, lap, and needle counts were correct at closure and at the conclusion of the case.               Caroleen Hamman, MD, FACS

## 2021-04-11 NOTE — Interval H&P Note (Signed)
History and Physical Interval Note:  04/11/2021 12:46 PM  Collin Byrd  has presented today for surgery, with the diagnosis of recurrent hernia.  The various methods of treatment have been discussed with the patient and family. After consideration of risks, benefits and other options for treatment, the patient has consented to  Procedure(s) with comments: Blawnox, open (Left) - provider requesting 2 hours / 120 minutes for procedure.  Diagnostic laparoscopy would also be likely used to make sure bowel is viable .  The patient's history has been reviewed, patient examined, no change in status, stable for surgery.  I have reviewed the patient's chart and labs.  Questions were answered to the patient's satisfaction.     [place]

## 2021-04-11 NOTE — OR Nursing (Signed)
Dr. Dahlia Byes call Collin Byrd via number [phone]. Dr. Dahlia Byes updated Collin Byrd on plan of care for patient. This RN spoke with Collin Byrd, she confirmed that Collin Byrd is her boyfriend and she is aware of removal of left testicle.Dr Claudie Leach at bedside at this time.

## 2021-04-11 NOTE — Transfer of Care (Signed)
Immediate Anesthesia Transfer of Care Note  Patient: Collin Byrd  Procedure(s) Performed: HERNIA REPAIR INGUINAL ADULT, open, WITH DIAGNOSTIC LAPAROSCOPY, AND ROBOT ASSISTED (Left ) TESTICULAR EXPLORATION AND REMOVAL OF LEFT TESTICLE (Left )  Patient Location: PACU  Anesthesia Type:General  Level of Consciousness: awake, alert  and oriented  Airway & Oxygen Therapy: Patient connected to face mask oxygen  Post-op Assessment: Post -op Vital signs reviewed and stable  Post vital signs: stable  Last Vitals:  Vitals Value Taken Time  BP 119/74 04/11/21 1904  Temp    Pulse 98 04/11/21 1906  Resp 20 04/11/21 1906  SpO2 100 % 04/11/21 1906  Vitals shown include unvalidated device data.  Last Pain:  Vitals:   04/11/21 1238  TempSrc: Temporal  PainSc: 3          Complications: No complications documented.

## 2021-04-11 NOTE — Anesthesia Preprocedure Evaluation (Addendum)
Anesthesia Evaluation  Patient identified by MRN, date of birth, ID band Patient awake    Reviewed: Allergy & Precautions, H&P , NPO status , Patient's Chart, lab work & pertinent test results  History of Anesthesia Complications Negative for: history of anesthetic complications  Airway Mallampati: II  TM Distance: >3 FB Neck ROM: full    Dental  (+) Teeth Intact   Pulmonary neg sleep apnea, neg COPD, Current Smoker,    breath sounds clear to auscultation       Cardiovascular (-) angina+CHF  (-) Past MI and (-) Cardiac Stents (-) dysrhythmias  Rhythm:regular Rate:Normal  Normal EF. Mod LVH. Normal RV. H/o PE   Neuro/Psych negative neurological ROS  negative psych ROS   GI/Hepatic negative GI ROS, (+) Cirrhosis   ascites  substance abuse  alcohol use, Portal hypertension   Endo/Other  diabetes  Renal/GU      Musculoskeletal   Abdominal   Peds  Hematology Takes Lovenox for h/o PE, last dose yesterday   Anesthesia Other Findings Poor insight into medical history Reports he quit drinking June 2021  Past Medical History: No date: Alcohol abuse No date: Ascites No date: Cataract No date: Cellulitis No date: CHF (congestive heart failure) ([place]) No date: Cirrhosis ([place]) No date: Diabetes mellitus without complication ([place]) 5462: History of methicillin resistant staphylococcus aureus (MRSA) 2021: Pneumonia No date: Portal hypertension ([place]) No date: Pulmonary embolus ([place]) No date: Sepsis ([place]) No date: Spontaneous bacterial peritonitis ([place])  Past Surgical History: No date: CATARACT EXTRACTION 01/03/2021: COLONOSCOPY WITH PROPOFOL; N/A     Comment:  Procedure: COLONOSCOPY WITH PROPOFOL;  Surgeon: Lin Landsman, MD;  Location: [facility] ENDOSCOPY;  Service:               Gastroenterology;  Laterality: N/A; 01/03/2021: ESOPHAGOGASTRODUODENOSCOPY (EGD) WITH PROPOFOL; N/A     Comment:  Procedure:  ESOPHAGOGASTRODUODENOSCOPY (EGD) WITH               PROPOFOL;  Surgeon: Lin Landsman, MD;  Location:               [facility] ENDOSCOPY;  Service: Gastroenterology;  Laterality:               N/A; No date: EYE SURGERY 2021: PARACENTESIS 7/0/3500: UMBILICAL HERNIA REPAIR; N/A     Comment:  Procedure: HERNIA REPAIR UMBILICAL ADULT, open;                Surgeon: Jules Husbands, MD;  Location: [facility] ORS;                Service: General;  Laterality: N/A; 04/04/2021: XI ROBOTIC ASSISTED INGUINAL HERNIA REPAIR WITH MESH; Left     Comment:  Procedure: XI ROBOTIC ASSISTED INGUINAL HERNIA REPAIR               WITH MESH;  Surgeon: Jules Husbands, MD;  Location: [facility]               ORS;  Service: General;  Laterality: Left;     Reproductive/Obstetrics negative OB ROS                            Anesthesia Physical Anesthesia Plan  ASA: III  Anesthesia Plan: General ETT and Modified Rapid Sequence   Post-op Pain Management:    Induction:   PONV Risk Score and Plan: Ondansetron,  Dexamethasone and Treatment may vary due to age or medical condition  Airway Management Planned:   Additional Equipment:   Intra-op Plan:   Post-operative Plan:   Informed Consent: I have reviewed the patients History and Physical, chart, labs and discussed the procedure including the risks, benefits and alternatives for the proposed anesthesia with the patient or authorized representative who has indicated his/her understanding and acceptance.     Dental Advisory Given  Plan Discussed with: Anesthesiologist, CRNA and Surgeon  Anesthesia Plan Comments:         Anesthesia Quick Evaluation

## 2021-04-11 NOTE — Anesthesia Procedure Notes (Signed)
Procedure Name: Intubation Date/Time: 04/11/2021 1:43 PM Performed by: Justus Memory, CRNA Pre-anesthesia Checklist: Patient identified, Patient being monitored, Timeout performed, Emergency Drugs available and Suction available Patient Re-evaluated:Patient Re-evaluated prior to induction Oxygen Delivery Method: Circle system utilized Preoxygenation: Pre-oxygenation with 100% oxygen Induction Type: IV induction Ventilation: Mask ventilation without difficulty Laryngoscope Size: Mac and 3 Grade View: Grade I Tube type: Oral Tube size: 7.0 mm Number of attempts: 1 Airway Equipment and Method: Stylet,  Patient positioned with wedge pillow and Video-laryngoscopy Placement Confirmation: ETT inserted through vocal cords under direct vision,  positive ETCO2 and breath sounds checked- equal and bilateral Secured at: 21 cm Tube secured with: Tape Dental Injury: Teeth and Oropharynx as per pre-operative assessment  Difficulty Due To: Difficulty was anticipated and Difficult Airway- due to anterior larynx Future Recommendations: Recommend- induction with short-acting agent, and alternative techniques readily available

## 2021-04-12 DIAGNOSIS — K4031 Unilateral inguinal hernia, with obstruction, without gangrene, recurrent: Secondary | ICD-10-CM | POA: Diagnosis not present

## 2021-04-12 LAB — PHOSPHORUS: Phosphorus: 4 mg/dL (ref 2.5–4.6)

## 2021-04-12 LAB — CBC
HCT: 24.5 % — ABNORMAL LOW (ref 39.0–52.0)
Hemoglobin: 8.3 g/dL — ABNORMAL LOW (ref 13.0–17.0)
MCH: 31.3 pg (ref 26.0–34.0)
MCHC: 33.9 g/dL (ref 30.0–36.0)
MCV: 92.5 fL (ref 80.0–100.0)
Platelets: 295 10*3/uL (ref 150–400)
RBC: 2.65 MIL/uL — ABNORMAL LOW (ref 4.22–5.81)
RDW: 14.5 % (ref 11.5–15.5)
WBC: 15.2 10*3/uL — ABNORMAL HIGH (ref 4.0–10.5)
nRBC: 0 % (ref 0.0–0.2)

## 2021-04-12 LAB — COMPREHENSIVE METABOLIC PANEL
ALT: 14 U/L (ref 0–44)
AST: 17 U/L (ref 15–41)
Albumin: 2.5 g/dL — ABNORMAL LOW (ref 3.5–5.0)
Alkaline Phosphatase: 46 U/L (ref 38–126)
Anion gap: 6 (ref 5–15)
BUN: 23 mg/dL — ABNORMAL HIGH (ref 6–20)
CO2: 22 mmol/L (ref 22–32)
Calcium: 7.6 mg/dL — ABNORMAL LOW (ref 8.9–10.3)
Chloride: 105 mmol/L (ref 98–111)
Creatinine, Ser: 1.1 mg/dL (ref 0.61–1.24)
GFR, Estimated: >60 mL/min (ref >60)
Glucose, Bld: 180 mg/dL — ABNORMAL HIGH (ref 70–99)
Potassium: 5 mmol/L (ref 3.5–5.1)
Sodium: 133 mmol/L — ABNORMAL LOW (ref 135–145)
Total Bilirubin: 0.8 mg/dL (ref 0.3–1.2)
Total Protein: 6.6 g/dL (ref 6.5–8.1)

## 2021-04-12 LAB — CULTURE, BLOOD (ROUTINE X 2)
Culture: NO GROWTH
Special Requests: ADEQUATE

## 2021-04-12 LAB — MAGNESIUM: Magnesium: 2 mg/dL (ref 1.7–2.4)

## 2021-04-12 MED ORDER — OXYCODONE HCL 5 MG PO TABS: 5.0000 mg | ORAL_TABLET | Freq: Four times a day (QID) | ORAL | 0 refills | Status: DC | PRN

## 2021-04-12 MED ORDER — PREGABALIN 100 MG PO CAPS: 100.0000 mg | ORAL_CAPSULE | Freq: Two times a day (BID) | ORAL | 0 refills | Status: DC

## 2021-04-12 MED ORDER — CHLORHEXIDINE GLUCONATE CLOTH 2 % EX PADS
6.0000 | MEDICATED_PAD | Freq: Every day | CUTANEOUS | Status: DC
  Administered 2021-04-12: 6 via TOPICAL

## 2021-04-12 NOTE — Progress Notes (Deleted)
[facility] Day(s): 0.   Post op day(s): 1 Day Post-Op.   Interval History:  Patient seen and examined No acute events or new complaints overnight.  Patient reports He is doing well, minimal (if any) incisional pain No fever, chills, nausea, emesis Slight leukocytosis this morning to 15.1K; likely reactive from OR Hgb low but stable at 8.3; suspect some dilutional component to this Maintained normal renal function sCr - 1.10; UO - 650 ccs Mild hyponatremia to 133 but o/w no electrolyte derangements Surgical drain with 45 ccs out; serosanguinous  He is on carb modified diet  Vital signs in last 24 hours: [min-max] current  Temp:  [97.6 F (36.4 C)-98.5 F (36.9 C)] 98 F (36.7 C) (04/15 0328) Pulse Rate:  [78-98] 78 (04/15 0328) Resp:  [14-20] 18 (04/15 0328) BP: (91-115)/(54-Collin) 92/54 (04/15 0328) SpO2:  [95 %-100 %] 97 % (04/15 0328) Weight:  [85.7 kg-89.8 kg] 89.8 kg (04/14 2008)     Height: 5\' 7"  (170.2 cm) Weight: 89.8 kg BMI (Calculated): 31   Intake/Output last 2 shifts:  04/14 0701 - 04/15 0700 In: 1498.5 [P.O.:50; I.V.:1298.5; IV Piggyback:150] Out: 795 [Urine:650; Drains:45; Blood:100]   Physical Exam:  Constitutional: alert, cooperative and no distress  Respiratory: breathing non-labored at rest  Cardiovascular: regular rate and sinus rhythm  Gastrointestinal: Soft, incisional soreness, non-distended, no rebound/guarding. Surgical drain in the LLQ with serosanguinous drainage Genitourinary: Foley in place, some left sided scrotal swelling Integumentary: Laparoscopic and left inguinal incisions are CDI with dermabond, no erythema or drainage  Labs:  CBC Latest Ref Rng & Units 04/12/2021 04/11/2021 04/07/2021  WBC 4.0 - 10.5 K/uL 15.2(H) 14.0(H) 13.1(H)  Hemoglobin 13.0 - 17.0 g/dL 8.3(L) 8.9(L) 10.3(L)  Hematocrit 39.0 - 52.0 % 24.5(L) 26.7(L) 30.4(L)  Platelets 150 - 400 K/uL 295 307 257   CMP Latest Ref Rng &  Units 04/12/2021 04/11/2021 04/07/2021  Glucose 70 - 99 mg/dL 180(H) - 126(H)  BUN 6 - 20 mg/dL 23(H) - 25(H)  Creatinine 0.61 - 1.24 mg/dL 1.10 0.98 1.02  Sodium 135 - 145 mmol/L 133(L) - 132(L)  Potassium 3.5 - 5.1 mmol/L 5.0 - 4.1  Chloride 98 - 111 mmol/L 105 - 99  CO2 22 - 32 mmol/L 22 - 24  Calcium 8.9 - 10.3 mg/dL 7.6(L) - 8.2(L)  Total Protein 6.5 - 8.1 g/dL 6.6 - 7.8  Total Bilirubin 0.3 - 1.2 mg/dL 0.8 - 2.6(H)  Alkaline Phos 38 - 126 U/L 46 - 46  AST 15 - 41 U/L 17 - 14(L)  ALT 0 - 44 U/L 14 - 13     Imaging studies: No new pertinent imaging studies   Assessment/Plan:  60 y.o. male overall doing well 1 Day Post-Op s/p complex recurrent incarcerated left inguinal hernia repair with mesh including both open and robotic assisted laparoscopic portion complicated by compromised left testicle requiring Byrd   - Okay to continue regular/carb modified diet  - Discontinue foley catheter  - Monitor abdominal examination; on-going bowel function  - Pain control prn; antiemetics prn  - Continue surgical drain; monitor and record output   - Mobilization as tolerated; lifting restrictions x6 weeks minimum    - Will reassess late today for potential discharged   All of the above findings and recommendations were discussed with the patient, and the medical team, and all of patient's questions were answered to his expressed satisfaction.  -- [name], PA-C [facility] 04/12/2021, 7:08 AM [phone] M-F:  7am - 4pm

## 2021-04-12 NOTE — Progress Notes (Signed)
Pt still awaiting a ride home. In the past couple of hours the pt has ambulated twice in the room and sat up in the recliner for at least and hour.  Pt instructed on how to empty the JP drain and medications reviewed.  He requests that instructions be provided to his wife and her grandson when they arrive because he has poor vision.

## 2021-04-12 NOTE — Discharge Instructions (Signed)
In addition to included general post-operative instructions,  Diet: Resume home diet.   Activity: No heavy lifting >20 pounds (children, pets, laundry, garbage) for 6 weeks, but light activity and walking are encouraged. Do not drive or drink alcohol if taking narcotic pain medications or having pain that might distract from driving.  Wound care: If you can keep drain site covered...2 days after surgery (04/17), you may shower/get incision wet with soapy water and pat dry (do not rub incisions), but no baths or submerging incision underwater until follow-up.   Medications: Resume all home medications. For mild to moderate pain: acetaminophen (Tylenol) or ibuprofen/naproxen (if no kidney disease). Combining Tylenol with alcohol can substantially increase your risk of causing liver disease. Narcotic pain medications, if prescribed, can be used for severe pain, though may cause nausea, constipation, and drowsiness. Do not combine Tylenol and Percocet (or similar) within a 6 hour period as Percocet (and similar) contain(s) Tylenol. If you do not need the narcotic pain medication, you do not need to fill the prescription.  Call office [phone] / [phone]) at any time if any questions, worsening pain, fevers/chills, bleeding, drainage from incision site, or other concerns.

## 2021-04-12 NOTE — Discharge Summary (Signed)
[facility] SURGICAL DISCHARGE SUMMARY  Patient ID: Collin Byrd MRN: [id number] DOB/AGE: January 17, 1961 60 y.o.  Admit date: 04/11/2021 Discharge date: 04/12/2021  Discharge Diagnoses Patient Active Problem List   Diagnosis Date Noted  . S/P inguinal hernia repair 04/11/2021  . Hepatic cirrhosis ([place]) 06/07/2020  . Alcohol abuse 06/05/2020    Consultants Urology  Procedures 04/11/2021: Diagnostic laparoscopy Robotic asssited Open Inguinal hernia repair Recurrent with Phasix Mesh Adjacent Tissue transfer Myocutaneous flap to be able to close Giant Left Inguinal defect measuring 270 cm2 ( 15x18cm defect) Removal of mesh abdominal wall  04/11/2021 - Dr Diamantina Providence (Urology) Left radical orchiectomy   HPI: Collin Byrd is a 60 y.o. male with a history of recurrent incarcerated left inguinal hernia who presents to [facility] on 04/14 for inguinal hernia repair with Dr Dahlia Byes.   Hospital Course: Informed consent was obtained and documented, and patient underwent complex robotic laparoscopic and open left inguinal hernia and radical orchiectomy (Dr Dahlia Byes and Dr Diamantina Providence, 04/11/2021). Post-operatively, patient's did very well and improved/resolved and advancement of patient's diet and ambulation were well-tolerated. The remainder of patient's hospital course was essentially unremarkable, and discharge planning was initiated accordingly with patient safely able to be discharged home with appropriate discharge instructions, pain control, and outpatient follow-up after all of his questions were answered to his expressed satisfaction.  Discharge Condition: Good   Allergies as of 04/12/2021   No Known Allergies     Medication List    TAKE these medications   amoxicillin-clavulanate 875-125 MG tablet Commonly known as: Augmentin Take 1 tablet by mouth 2 (two) times daily.   blood glucose meter kit and supplies Kit Dispense based on patient and insurance preference. Use up  to four times daily as directed. (FOR ICD-9 250.00, 250.01).   enoxaparin 40 MG/0.4ML injection Commonly known as: LOVENOX Inject 0.4 mLs (40 mg total) into the skin daily. START the day after your surgery. Take for 7 days.   Entresto 24-26 MG Generic drug: sacubitril-valsartan Take 1 tablet by mouth 2 (two) times daily.   folic acid 1 MG tablet Commonly known as: FOLVITE TAKE ONE TABLET BY MOUTH EVERY DAY   furosemide 40 MG tablet Commonly known as: LASIX TAKE ONE TABLET BY MOUTH EVERY DAY   omega-3 acid ethyl esters 1 g capsule Commonly known as: LOVAZA TAKE 2 CAPSULES BY MOUTH IN THE MORNING AND 2 AT BEDTIME What changed:   how much to take  how to take this  when to take this   oxyCODONE 5 MG immediate release tablet Commonly known as: Oxy IR/ROXICODONE Take 1 tablet (5 mg total) by mouth every 6 (six) hours as needed for severe pain or breakthrough pain. What changed:   when to take this  reasons to take this   pantoprazole 40 MG tablet Commonly known as: PROTONIX TAKE ONE TABLET BY MOUTH EVERY DAY What changed: how much to take   pregabalin 100 MG capsule Commonly known as: LYRICA Take 1 capsule (100 mg total) by mouth 2 (two) times daily for 14 days.   spironolactone 100 MG tablet Commonly known as: ALDACTONE TAKE ONE TABLET BY MOUTH 2 TIMES A DAY. What changed:   how much to take  additional instructions   thiamine 100 MG tablet Commonly known as: VITAMIN B-1 TAKE ONE TABLET BY MOUTH EVERY DAY What changed: how much to take         Follow-up Information    [place], [place], MD. Schedule an appointment as soon as  possible for a visit on 04/22/2021.   Specialty: General Surgery Why: s/p left inguinal hernia repair, has drain  Contact information: [address] [place] [address] [phone]                Time spent on discharge management including discussion of hospital course, clinical condition, outpatient  instructions, prescriptions, and follow up with the patient and members of the medical team: >30 minutes  -- [name] , PA-C [facility]  04/12/2021, 11:49 AM [phone] M-F: 7am - 4pm

## 2021-04-12 NOTE — Progress Notes (Signed)
Pt discharged home with family member.  Discharge instructions reviewed with family member.  She verbalizes understanding and is able to repeat instructions correctly.  PIV's removed and JP drain emptied.  VSS and pt without complaints. Pt was discharged with all personal belongings.  He left the unit via wheelchair.

## 2021-04-12 NOTE — Plan of Care (Signed)
  Problem: Clinical Measurements: Goal: Ability to maintain clinical measurements within normal limits will improve Outcome: Progressing Goal: Will remain free from infection Outcome: Progressing Goal: Diagnostic test results will improve Outcome: Progressing Goal: Respiratory complications will improve Outcome: Progressing Goal: Cardiovascular complication will be avoided Outcome: Progressing   Problem: Pain Managment: Goal: General experience of comfort will improve Outcome: Progressing   Pt alert and oriented. V/S stable except latest Bp-92/54 (map-66), Oncall surgery informed with no orders. Pain controlled by sched Toradol. JP in place draining bloody-serosanguinous output. FC in place. Incisions on abdomen; dry and clean.

## 2021-04-12 NOTE — Progress Notes (Signed)
Order received for discharge earlier but pt is awaiting a ride.  He states that his ride should arrive sometime after 4:30.

## 2021-04-14 DIAGNOSIS — K4030 Unilateral inguinal hernia, with obstruction, without gangrene, not specified as recurrent: Secondary | ICD-10-CM

## 2021-04-15 ENCOUNTER — Encounter: Payer: Self-pay | Admitting: Surgery

## 2021-04-15 LAB — SURGICAL PATHOLOGY

## 2021-04-16 ENCOUNTER — Telehealth: Payer: Self-pay | Admitting: *Deleted

## 2021-04-16 NOTE — Telephone Encounter (Signed)
Call returned to [facility]. Patient d/c on 4/15 from [facility]. Aldona Bar wanted to know if patient should continue taking the remaining dosages of the Lovenox remaining. Pt did get some Lovenox in the [facility].  I spoke with Dr. Rogue Bussing. He would like patient to finish his remaining dosages of lovenox and then return in approx. 2 weeks for lab/md. samantha given new apts for the patient. She gave verbal understanding of the plan of care.

## 2021-04-16 NOTE — Telephone Encounter (Signed)
Aldona Bar who is on patient ROI from December 2021 called asking if patient should restart the "injections in his stomach" citing that he had to have a second surgery and he stopped the injection when he had the second surgery. She said he still has a weeks worth of medicine. Please advise

## 2021-04-17 ENCOUNTER — Other Ambulatory Visit: Payer: Self-pay

## 2021-04-17 MED FILL — Omega-3-acid Ethyl Esters Cap 1 GM: ORAL | 30 days supply | Qty: 120 | Fill #0 | Status: AC

## 2021-04-17 MED FILL — Thiamine Mononitrate Tab 100 MG: ORAL | 30 days supply | Qty: 30 | Fill #0 | Status: AC

## 2021-04-17 MED FILL — Pantoprazole Sodium EC Tab 40 MG (Base Equiv): ORAL | 90 days supply | Qty: 90 | Fill #0 | Status: AC

## 2021-04-24 ENCOUNTER — Encounter: Payer: Self-pay | Admitting: Surgery

## 2021-04-24 ENCOUNTER — Ambulatory Visit (INDEPENDENT_AMBULATORY_CARE_PROVIDER_SITE_OTHER): Payer: Self-pay | Admitting: Surgery

## 2021-04-24 ENCOUNTER — Other Ambulatory Visit: Payer: Self-pay

## 2021-04-24 VITALS — BP 103/65 | HR 102 | Temp 98.1°F | Ht 67.0 in | Wt 187.4 lb

## 2021-04-24 DIAGNOSIS — Z09 Encounter for follow-up examination after completed treatment for conditions other than malignant neoplasm: Secondary | ICD-10-CM

## 2021-04-24 NOTE — Patient Instructions (Signed)
See follow up appointment below. If you have any concerns or questions, please feel free to call our office.

## 2021-04-26 ENCOUNTER — Encounter: Payer: Self-pay | Admitting: Surgery

## 2021-04-26 NOTE — Progress Notes (Signed)
S/p open LIH early recurrence, L orchiectomy Path d/w the pt. He is glad that his last repair worked.   Scant output less 20cc day Taking PO ambulatin Drove to the office today No fevers  PE NAD Abd: soft, incision healing well, induration expected due to double mesh and tissue repair. No infection, no recurrence , no peritonitis. Drain serous fluid. Removed. No hydrocele  A/ p Doing well W/o complications  RTC in 6- 8 weeks No heavy lifting

## 2021-04-29 ENCOUNTER — Other Ambulatory Visit: Payer: Self-pay | Admitting: *Deleted

## 2021-04-29 DIAGNOSIS — I2699 Other pulmonary embolism without acute cor pulmonale: Secondary | ICD-10-CM

## 2021-04-30 ENCOUNTER — Inpatient Hospital Stay: Payer: Medicaid Other | Attending: Internal Medicine

## 2021-04-30 ENCOUNTER — Inpatient Hospital Stay (HOSPITAL_BASED_OUTPATIENT_CLINIC_OR_DEPARTMENT_OTHER): Payer: Medicaid Other | Admitting: Internal Medicine

## 2021-04-30 ENCOUNTER — Encounter: Payer: Self-pay | Admitting: Internal Medicine

## 2021-04-30 ENCOUNTER — Other Ambulatory Visit: Payer: Self-pay

## 2021-04-30 DIAGNOSIS — D649 Anemia, unspecified: Secondary | ICD-10-CM | POA: Insufficient documentation

## 2021-04-30 DIAGNOSIS — K7460 Unspecified cirrhosis of liver: Secondary | ICD-10-CM | POA: Insufficient documentation

## 2021-04-30 DIAGNOSIS — I2699 Other pulmonary embolism without acute cor pulmonale: Secondary | ICD-10-CM

## 2021-04-30 DIAGNOSIS — F17210 Nicotine dependence, cigarettes, uncomplicated: Secondary | ICD-10-CM | POA: Insufficient documentation

## 2021-04-30 DIAGNOSIS — Z86711 Personal history of pulmonary embolism: Secondary | ICD-10-CM | POA: Insufficient documentation

## 2021-04-30 LAB — CBC WITH DIFFERENTIAL/PLATELET
Abs Immature Granulocytes: 0.16 10*3/uL — ABNORMAL HIGH (ref 0.00–0.07)
Basophils Absolute: 0.1 10*3/uL (ref 0.0–0.1)
Basophils Relative: 0 %
Eosinophils Absolute: 0.2 10*3/uL (ref 0.0–0.5)
Eosinophils Relative: 1 %
HCT: 30.3 % — ABNORMAL LOW (ref 39.0–52.0)
Hemoglobin: 9.9 g/dL — ABNORMAL LOW (ref 13.0–17.0)
Immature Granulocytes: 1 %
Lymphocytes Relative: 11 %
Lymphs Abs: 1.3 10*3/uL (ref 0.7–4.0)
MCH: 30.6 pg (ref 26.0–34.0)
MCHC: 32.7 g/dL (ref 30.0–36.0)
MCV: 93.5 fL (ref 80.0–100.0)
Monocytes Absolute: 1 10*3/uL (ref 0.1–1.0)
Monocytes Relative: 9 %
Neutro Abs: 8.7 10*3/uL — ABNORMAL HIGH (ref 1.7–7.7)
Neutrophils Relative %: 78 %
Platelets: 271 10*3/uL (ref 150–400)
RBC: 3.24 MIL/uL — ABNORMAL LOW (ref 4.22–5.81)
RDW: 15.3 % (ref 11.5–15.5)
WBC: 11.3 10*3/uL — ABNORMAL HIGH (ref 4.0–10.5)
nRBC: 0 % (ref 0.0–0.2)

## 2021-04-30 LAB — BASIC METABOLIC PANEL
Anion gap: 12 (ref 5–15)
BUN: 17 mg/dL (ref 6–20)
CO2: 23 mmol/L (ref 22–32)
Calcium: 8.9 mg/dL (ref 8.9–10.3)
Chloride: 101 mmol/L (ref 98–111)
Creatinine, Ser: 0.77 mg/dL (ref 0.61–1.24)
GFR, Estimated: >60 mL/min (ref >60)
Glucose, Bld: 108 mg/dL — ABNORMAL HIGH (ref 70–99)
Potassium: 4.4 mmol/L (ref 3.5–5.1)
Sodium: 136 mmol/L (ref 135–145)

## 2021-04-30 NOTE — Progress Notes (Signed)
[place] NOTE  Patient Care Team: Langston Reusing, NP as PCP - General (Gerontology)  CHIEF COMPLAINTS/PURPOSE OF CONSULTATION: DVT/PE  #  June 16th,2021- Acute bil Segmentl/sub-segmental PE; no right heart strain.  Unprovoked.-Recommend 1 year anticoagulation; STOPPED in mid-Late JAN 2022 [elevated PT/INR on pre-op blood work]  # Mediastinal/Hilar adenopathy; Pneumonia [June, 2021]  # Cirrhosis [normal WBC/platelets; mild anemia] ? Alcohol' # JAN 2022-EGD/ colonoscopy-ingiunal hernia [Dr.Vanga]  Oncology History   No history exists.   HISTORY OF PRESENTING ILLNESS:  Collin Byrd 60 y.o.  male history of alcoholism/cirrhosis-and history of bilateral PE ; currently off xarelto [in JAN 2022] is here for follow-up.  In the interim patient underwent inguinal hernia surgery repair/also needing orchiectomy.  Patient status post Lovenox postsurgery.  No bleeding or clotting complication noted.   Review of Systems  Constitutional: Positive for malaise/fatigue. Negative for chills, diaphoresis, fever and weight loss.  HENT: Negative for nosebleeds and sore throat.   Eyes: Negative for double vision.  Respiratory: Negative for cough, hemoptysis, sputum production, shortness of breath and wheezing.   Cardiovascular: Negative for chest pain, palpitations, orthopnea and leg swelling.  Gastrointestinal: Negative for abdominal pain, blood in stool, constipation, diarrhea, heartburn, melena, nausea and vomiting.  Genitourinary: Negative for dysuria, frequency and urgency.  Musculoskeletal: Negative for back pain and joint pain.  Skin: Negative.  Negative for itching and rash.  Neurological: Negative for dizziness, tingling, focal weakness, weakness and headaches.  Psychiatric/Behavioral: Negative for depression. The patient is not nervous/anxious and does not have insomnia.      MEDICAL HISTORY:  Past Medical History:  Diagnosis Date  . Alcohol abuse   .  Ascites   . Cataract   . Cellulitis   . CHF (congestive heart failure) ([place])   . Cirrhosis ([place])   . Diabetes mellitus without complication ([place])   . History of methicillin resistant staphylococcus aureus (MRSA) 2016  . Pneumonia 2021  . Portal hypertension ([place])   . Pulmonary embolus ([place])   . Sepsis ([place])   . Spontaneous bacterial peritonitis ([place])     SURGICAL HISTORY: Past Surgical History:  Procedure Laterality Date  . CATARACT EXTRACTION    . COLONOSCOPY WITH PROPOFOL N/A 01/03/2021   Procedure: COLONOSCOPY WITH PROPOFOL;  Surgeon: Lin Landsman, MD;  Location: [facility] ENDOSCOPY;  Service: Gastroenterology;  Laterality: N/A;  . ESOPHAGOGASTRODUODENOSCOPY (EGD) WITH PROPOFOL N/A 01/03/2021   Procedure: ESOPHAGOGASTRODUODENOSCOPY (EGD) WITH PROPOFOL;  Surgeon: Lin Landsman, MD;  Location: [facility] ENDOSCOPY;  Service: Gastroenterology;  Laterality: N/A;  . EYE SURGERY    . INGUINAL HERNIA REPAIR Left 04/11/2021   Procedure: HERNIA REPAIR INGUINAL ADULT, open, WITH DIAGNOSTIC LAPAROSCOPY, AND ROBOT ASSISTED;  Surgeon: Jules Husbands, MD;  Location: [facility] ORS;  Service: General;  Laterality: Left;  provider requesting 2 hours / 120 minutes for procedure.  Marland Kitchen PARACENTESIS  2021  . TESTICULAR EXPLORATION Left 04/11/2021   Procedure: TESTICULAR EXPLORATION AND REMOVAL OF LEFT TESTICLE;  Surgeon: Billey Co, MD;  Location: [facility] ORS;  Service: Urology;  Laterality: Left;  . UMBILICAL HERNIA REPAIR N/A 04/04/2021   Procedure: HERNIA REPAIR UMBILICAL ADULT, open;  Surgeon: Jules Husbands, MD;  Location: [facility] ORS;  Service: General;  Laterality: N/A;  . XI ROBOTIC ASSISTED INGUINAL HERNIA REPAIR WITH MESH Left 04/04/2021   Procedure: XI ROBOTIC ASSISTED INGUINAL HERNIA REPAIR WITH MESH;  Surgeon: Jules Husbands, MD;  Location: [facility] ORS;  Service: General;  Laterality: Left;    SOCIAL HISTORY:  Social History   Socioeconomic History  . Marital status: Married    Spouse name: Not on  file  . Number of children: Not on file  . Years of education: Not on file  . Highest education level: Not on file  Occupational History  . Not on file  Tobacco Use  . Smoking status: Current Some Day Smoker    Packs/day: 0.25    Types: Cigarettes  . Smokeless tobacco: Never Used  Vaping Use  . Vaping Use: Never used  Substance and Sexual Activity  . Alcohol use: Not Currently  . Drug use: Never  . Sexual activity: Not Currently  Other Topics Concern  . 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 Smoking- 1-3 cig/day. Lives in [place]; with wife. No children.    Social Determinants of Health   Financial Resource Strain: Not on file  Food Insecurity: Not on file  Transportation Needs: Not on file  Physical Activity: Not on file  Stress: Not on file  Social Connections: Not on file  Intimate Partner Violence: Not on file    FAMILY HISTORY: History reviewed. No pertinent family history.  ALLERGIES:  has No Known Allergies.  MEDICATIONS:  Current Outpatient Medications  Medication Sig Dispense Refill  . blood glucose meter kit and supplies KIT Dispense based on patient and insurance preference. Use up to four times daily as directed. (FOR ICD-9 250.00, 250.01). 1 each 0  . folic acid (FOLVITE) 1 MG tablet TAKE ONE TABLET BY MOUTH EVERY DAY 30 tablet 2  . furosemide (LASIX) 40 MG tablet TAKE ONE TABLET BY MOUTH EVERY DAY 90 tablet 3  . omega-3 acid ethyl esters (LOVAZA) 1 g capsule TAKE 2 CAPSULES BY MOUTH IN THE MORNING AND 2 AT BEDTIME (Patient taking differently: Take 2 g by mouth 2 (two) times daily.) 120 capsule 1  . pantoprazole (PROTONIX) 40 MG tablet TAKE ONE TABLET BY MOUTH EVERY DAY (Patient taking differently: Take 40 mg by mouth daily.) 90 tablet 1  . sacubitril-valsartan (ENTRESTO) 24-26 MG Take 1 tablet by mouth 2 (two) times daily.    Marland Kitchen spironolactone (ALDACTONE) 100 MG tablet TAKE ONE  TABLET BY MOUTH 2 TIMES A DAY. (Patient taking differently: Take 100 mg by mouth 2 (two) times daily. TAKE ONE TABLET BY MOUTH 2 TIMES A DAY.) 180 tablet 3  . thiamine (VITAMIN B-1) 100 MG tablet TAKE ONE TABLET BY MOUTH EVERY DAY (Patient taking differently: Take 100 mg by mouth daily.) 30 tablet 3   No current facility-administered medications for this visit.      Marland Kitchen  PHYSICAL EXAMINATION:  Vitals:   04/30/21 1315  BP: 101/67  Pulse: 99  Resp: 20  Temp: 98.9 F (37.2 C)   There were no vitals filed for this visit.  Physical Exam HENT:     Head: Normocephalic and atraumatic.     Mouth/Throat:     Pharynx: No oropharyngeal exudate.  Eyes:     Pupils: Pupils are equal, round, and reactive to light.  Cardiovascular:     Rate and Rhythm: Normal rate and regular rhythm.  Pulmonary:     Effort: No respiratory distress.     Breath sounds: No wheezing.  Abdominal:     General: Bowel sounds are normal. There is no distension.     Palpations: Abdomen is soft. There is no mass.     Tenderness: There is no abdominal tenderness. There  is no guarding or rebound.  Musculoskeletal:        General: No tenderness. Normal range of motion.     Cervical back: Normal range of motion and neck supple.  Skin:    General: Skin is warm.  Neurological:     Mental Status: He is alert and oriented to person, place, and time.  Psychiatric:        Mood and Affect: Affect normal.      LABORATORY DATA: I have reviewed the data as listed Lab Results  Component Value Date   WBC 11.3 (H) 04/30/2021   HGB 9.9 (L) 04/30/2021   HCT 30.3 (L) 04/30/2021   MCV 93.5 04/30/2021   PLT 271 04/30/2021   Recent Labs    06/05/20 0933 06/06/20 0106 07/24/20 1215 10/23/20 1027 11/14/20 1126 01/21/21 1252 02/13/21 1102 04/07/21 2021 04/11/21 2041 04/12/21 0441 04/30/21 1255  NA 129*   < > 139   < > 139   < > 138 132*  --  133* 136  K 4.0   < > 4.1   < > 4.1   < > 4.2 4.1  --  5.0 4.4  CL 97*    < > 105   < > 103   < > 99 99  --  105 101  CO2 25   < > 25   < > 23   < > 22 24  --  22 23  GLUCOSE 183*   < > 124*   < > 105*   < > 93 126*  --  180* 108*  BUN 9   < > 14   < > 12   < > 18 25*  --  23* 17  CREATININE 0.83   < > 1.08   < > 0.92   < > 0.87 1.02 0.98 1.10 0.77  CALCIUM 6.8*   < > 9.1   < > 9.7   < > 9.8 8.2*  --  7.6* 8.9  GFRNONAA >60   < > >60   < > 92   < > 96 >60 >60 >60 >60  GFRAA >60   < > >60  --  106  --  111  --   --   --   --   PROT 5.7*   < > 7.6  --   --    < > 7.8 7.8  --  6.6  --   ALBUMIN 1.7*  1.7*   < > 3.9  --   --    < > 4.6 3.5  --  2.5*  --   AST 25   < > 20  --   --    < > 17 14*  --  17  --   ALT 22   < > 15  --   --    < > 16 13  --  14  --   ALKPHOS 59   < > 55  --   --    < > 86 46  --  46  --   BILITOT 1.0   < > 1.1  --   --    < > 0.8 2.6*  --  0.8  --   BILIDIR 0.2  --   --   --   --   --   --   --   --   --   --   IBILI 0.8  --   --   --   --   --   --   --   --   --   --    < > =  values in this interval not displayed.    RADIOGRAPHIC STUDIES: I have personally reviewed the radiological images as listed and agreed with the findings in the report. CT ABDOMEN PELVIS W CONTRAST  Result Date: 04/07/2021 CLINICAL DATA:  Fever and redness at the incision site for hernia surgery performed on 04/04/2021 EXAM: CT ABDOMEN AND PELVIS WITH CONTRAST TECHNIQUE: Multidetector CT imaging of the abdomen and pelvis was performed using the standard protocol following bolus administration of intravenous contrast. CONTRAST:  129m OMNIPAQUE IOHEXOL 300 MG/ML  SOLN COMPARISON:  01/29/2021 FINDINGS: Lower chest: Interval mild bibasilar linear atelectasis. Normal sized heart. Hepatobiliary: Stable lobulated liver contours with per tree of the lateral segment left lobe and caudate lobe and small right lobe. Distended gallbladder with no gallstones or gallbladder wall thickening. Pancreas: Unremarkable. No pancreatic ductal dilatation or surrounding inflammatory changes.  Spleen: Normal in size without focal abnormality. Adrenals/Urinary Tract: Adrenal glands are unremarkable. Kidneys are normal, without renal calculi, focal lesion, or hydronephrosis. Bladder is unremarkable. Stomach/Bowel: Stomach is within normal limits. Appendix appears normal. No evidence of bowel wall thickening, distention, or inflammatory changes. Vascular/Lymphatic: Atheromatous arterial calcifications without aneurysm. No enlarged lymph nodes. Reproductive: Moderately enlarged prostate gland containing coarse calcifications. Other: A large indirect left inguinal hernias again demonstrated with sigmoid colon extending into the mouth of the hernia and sigmoid mesocolon and fluid extending into the hernia. There are also multiple separate collections of fat in the hernia more distally with some twisting of the associated vessels in the distal aspect of the hernia in the upper scrotum. There is also soft tissue stranding and air or gas in the subcutaneous fat on either side of the umbilicus and extending into the underlying anterior peritoneal fat. Musculoskeletal: Lumbar and lower thoracic spine degenerative changes. IMPRESSION: 1. Large indirect left inguinal hernia with sigmoid colon extending into the mouth of the hernia and sigmoid mesocolon and fluid extending into the hernia. 2. Multiple separate collections of fat in the hernia more distally with some twisting of the associated vessels in the distal aspect of the hernia in the upper scrotum. These could represent areas of fat necrosis. 3. Postsurgical soft tissue stranding and air or gas in the subcutaneous fat on either side of the umbilicus and extending into the underlying anterior peritoneal fat. 4. Stable changes of cirrhosis of the liver. 5. Moderately enlarged prostate gland. Electronically Signed   By: SClaudie ReveringM.D.   On: 04/07/2021 23:00    ASSESSMENT & PLAN:   Bilateral pulmonary embolism ([facility] # June 2021 -acute bil pul  embolism-unprovoked.  Etiology is unclear.  Currently off Xarelto since January 2022 [6 months of anticoagulation].  Patient not a very good candidate for long-term anticoagulation given underlying liver disease/coagulopathy [while on Xarelto].  # cirrhosis: ? Alcohol- quit since June 2021.  No evidence of decompensation.  #Active smoker-encouraged patient to quit smoking.   #Mild anemia hemoglobin 9-likely secondary to recent surgery.  Improving.  Recommend oral iron.  # DISPOSITION:  # follow up as needed; cancel appts in aug 2022- Dr.B    All questions were answered. The patient knows to call the clinic with any problems, questions or concerns.   GCammie Sickle MD 04/30/2021 4:28 PM

## 2021-04-30 NOTE — Assessment & Plan Note (Addendum)
#   June 2021 -acute bil pul embolism-unprovoked.  Etiology is unclear.  Currently off Xarelto since January 2022 [6 months of anticoagulation].  Patient not a very good candidate for long-term anticoagulation given underlying liver disease/coagulopathy [while on Xarelto].  # cirrhosis: ? Alcohol- quit since June 2021.  No evidence of decompensation.  #Active smoker-encouraged patient to quit smoking.   #Mild anemia hemoglobin 9-likely secondary to recent surgery.  Improving.  Recommend oral iron.  # DISPOSITION:  # follow up as needed; cancel appts in aug 2022- Dr.B

## 2021-05-01 ENCOUNTER — Other Ambulatory Visit: Payer: Self-pay | Admitting: Gerontology

## 2021-05-01 ENCOUNTER — Other Ambulatory Visit: Payer: Self-pay

## 2021-05-01 DIAGNOSIS — I5032 Chronic diastolic (congestive) heart failure: Secondary | ICD-10-CM

## 2021-05-01 DIAGNOSIS — F1011 Alcohol abuse, in remission: Secondary | ICD-10-CM

## 2021-05-01 MED ORDER — SPIRONOLACTONE 50 MG PO TABS
ORAL_TABLET | Freq: Two times a day (BID) | ORAL | 3 refills | Status: DC
Start: 2021-05-01 — End: 2021-05-07
  Filled 2021-05-01: qty 360, 90d supply, fill #0

## 2021-05-01 MED ORDER — THIAMINE MONONITRATE 100 MG PO TABS
ORAL_TABLET | Freq: Every day | ORAL | 3 refills | Status: DC
  Filled 2021-05-07: qty 30, 30d supply, fill #0
  Filled 2021-06-11: qty 30, 30d supply, fill #1
  Filled 2021-07-12: qty 30, 30d supply, fill #2
  Filled 2021-08-08: qty 30, 30d supply, fill #3

## 2021-05-01 MED ORDER — FOLIC ACID 1 MG PO TABS
ORAL_TABLET | Freq: Every day | ORAL | 2 refills | Status: DC
  Filled 2021-05-01 – 2021-05-07 (×2): qty 30, 30d supply, fill #0
  Filled 2021-06-11: qty 30, 30d supply, fill #1
  Filled 2021-07-12: qty 30, 30d supply, fill #2

## 2021-05-07 ENCOUNTER — Ambulatory Visit: Payer: Medicaid Other | Attending: Family | Admitting: Family

## 2021-05-07 ENCOUNTER — Other Ambulatory Visit: Payer: Self-pay | Admitting: Gerontology

## 2021-05-07 ENCOUNTER — Other Ambulatory Visit: Payer: Self-pay

## 2021-05-07 ENCOUNTER — Encounter: Payer: Self-pay | Admitting: Family

## 2021-05-07 VITALS — BP 102/67 | HR 92 | Resp 18 | Ht 67.0 in | Wt 190.1 lb

## 2021-05-07 DIAGNOSIS — I5032 Chronic diastolic (congestive) heart failure: Secondary | ICD-10-CM | POA: Diagnosis not present

## 2021-05-07 DIAGNOSIS — E119 Type 2 diabetes mellitus without complications: Secondary | ICD-10-CM

## 2021-05-07 DIAGNOSIS — I2782 Chronic pulmonary embolism: Secondary | ICD-10-CM

## 2021-05-07 DIAGNOSIS — Z79899 Other long term (current) drug therapy: Secondary | ICD-10-CM | POA: Diagnosis not present

## 2021-05-07 DIAGNOSIS — G479 Sleep disorder, unspecified: Secondary | ICD-10-CM | POA: Diagnosis not present

## 2021-05-07 DIAGNOSIS — K7030 Alcoholic cirrhosis of liver without ascites: Secondary | ICD-10-CM | POA: Insufficient documentation

## 2021-05-07 DIAGNOSIS — F17210 Nicotine dependence, cigarettes, uncomplicated: Secondary | ICD-10-CM | POA: Insufficient documentation

## 2021-05-07 DIAGNOSIS — E1136 Type 2 diabetes mellitus with diabetic cataract: Secondary | ICD-10-CM | POA: Diagnosis not present

## 2021-05-07 DIAGNOSIS — Z9889 Other specified postprocedural states: Secondary | ICD-10-CM

## 2021-05-07 DIAGNOSIS — K7031 Alcoholic cirrhosis of liver with ascites: Secondary | ICD-10-CM

## 2021-05-07 DIAGNOSIS — Z8719 Personal history of other diseases of the digestive system: Secondary | ICD-10-CM

## 2021-05-07 MED ORDER — OMEGA-3-ACID ETHYL ESTERS 1 G PO CAPS
ORAL_CAPSULE | ORAL | 1 refills | Status: DC
  Filled 2021-05-07: qty 120, fill #0
  Filled 2021-05-15: qty 120, 30d supply, fill #0
  Filled 2021-06-11 – 2021-06-18 (×2): qty 120, 30d supply, fill #1

## 2021-05-07 NOTE — Progress Notes (Signed)
Patient ID: Collin Byrd, male    DOB: 07/20/1961, 60 y.o.   MRN: [id number]  HPI  Mr Pickrel is a 60 y/o male with a history of DM, cataract, PE, cirrhosis, previous tobacco/ alcohol use and chronic heart failure.   Echo report from 06/05/20 reviewed and showed an EF of >55% along with moderate LVH.   Admitted for same day surgery on 04/11/21 for left incarcerated hernia repair. Was in the ED 04/07/21 due to post-op infection where he was placed on antibiotics and released with surgical f/u. Admitted 04/04/21 for same day hernia repair.   He presents today for a follow-up visit with a chief complaint of difficulty sleeping. He describes this as chronic in nature having been present for several years. He has associated gradual weight gain along with this. He denies any abdominal distention, palpitations, pedal edema, chest pain, dizziness, shortness of breath, cough or fatigue.   Past Medical History:  Diagnosis Date  . Alcohol abuse   . Ascites   . Cataract   . Cellulitis   . CHF (congestive heart failure) ([place])   . Cirrhosis ([place])   . Diabetes mellitus without complication ([place])   . History of methicillin resistant staphylococcus aureus (MRSA) 2016  . Pneumonia 2021  . Portal hypertension ([place])   . Pulmonary embolus ([place])   . Sepsis ([place])   . Spontaneous bacterial peritonitis [facility])    Past Surgical History:  Procedure Laterality Date  . CATARACT EXTRACTION    . COLONOSCOPY WITH PROPOFOL N/A 01/03/2021   Procedure: COLONOSCOPY WITH PROPOFOL;  Surgeon: Lin Landsman, MD;  Location: [facility] ENDOSCOPY;  Service: Gastroenterology;  Laterality: N/A;  . ESOPHAGOGASTRODUODENOSCOPY (EGD) WITH PROPOFOL N/A 01/03/2021   Procedure: ESOPHAGOGASTRODUODENOSCOPY (EGD) WITH PROPOFOL;  Surgeon: Lin Landsman, MD;  Location: [facility] ENDOSCOPY;  Service: Gastroenterology;  Laterality: N/A;  . EYE SURGERY    . INGUINAL HERNIA REPAIR Left 04/11/2021   Procedure: HERNIA REPAIR INGUINAL ADULT, open,  WITH DIAGNOSTIC LAPAROSCOPY, AND ROBOT ASSISTED;  Surgeon: Jules Husbands, MD;  Location: [facility] ORS;  Service: General;  Laterality: Left;  provider requesting 2 hours / 120 minutes for procedure.  Marland Kitchen PARACENTESIS  2021  . TESTICULAR EXPLORATION Left 04/11/2021   Procedure: TESTICULAR EXPLORATION AND REMOVAL OF LEFT TESTICLE;  Surgeon: Billey Co, MD;  Location: [facility] ORS;  Service: Urology;  Laterality: Left;  . UMBILICAL HERNIA REPAIR N/A 04/04/2021   Procedure: HERNIA REPAIR UMBILICAL ADULT, open;  Surgeon: Jules Husbands, MD;  Location: [facility] ORS;  Service: General;  Laterality: N/A;  . XI ROBOTIC ASSISTED INGUINAL HERNIA REPAIR WITH MESH Left 04/04/2021   Procedure: XI ROBOTIC ASSISTED INGUINAL HERNIA REPAIR WITH MESH;  Surgeon: Jules Husbands, MD;  Location: [facility] ORS;  Service: General;  Laterality: Left;   No family history on file. Social History   Tobacco Use  . Smoking status: Current Some Day Smoker    Packs/day: 0.25    Types: Cigarettes  . Smokeless tobacco: Never Used  Substance Use Topics  . Alcohol use: Not Currently   No Known Allergies  Prior to Admission medications   Medication Sig Start Date End Date Taking? Authorizing Provider  blood glucose meter kit and supplies KIT Dispense based on patient and insurance preference. Use up to four times daily as directed. (FOR ICD-9 250.00, 250.01). 07/12/20  Yes Iloabachie, Chioma E, NP  folic acid (FOLVITE) 1 MG tablet TAKE ONE TABLET BY MOUTH EVERY DAY 05/01/21 05/01/22 Yes Iloabachie, Chioma E, NP  furosemide (LASIX) 40 MG tablet TAKE ONE TABLET BY MOUTH EVERY DAY 12/18/20 12/18/21 Yes Quan Cybulski, Otila Kluver A, FNP  omega-3 acid ethyl esters (LOVAZA) 1 g capsule TAKE 2 CAPSULES BY MOUTH IN THE MORNING AND 2 AT BEDTIME 05/07/21 05/07/22 Yes Iloabachie, Chioma E, NP  pantoprazole (PROTONIX) 40 MG tablet TAKE ONE TABLET BY MOUTH EVERY DAY Patient taking differently: Take 40 mg by mouth daily. 01/09/21 01/09/22 Yes Iloabachie, Chioma E, NP   sacubitril-valsartan (ENTRESTO) 24-26 MG Take 1 tablet by mouth 2 (two) times daily.   Yes [provider]  spironolactone (ALDACTONE) 100 MG tablet Take 100 mg by mouth 2 (two) times daily.   Yes [provider]  thiamine (VITAMIN B-1) 100 MG tablet TAKE ONE TABLET BY MOUTH EVERY DAY 05/01/21 05/01/22 Yes Iloabachie, Chioma E, NP    Review of Systems  Constitutional: Negative for activity change, appetite change, diaphoresis and fatigue.  HENT: Negative for congestion, postnasal drip and sore throat.   Respiratory: Negative for cough, chest tightness and shortness of breath.   Cardiovascular: Negative for chest pain, palpitations and leg swelling.  Gastrointestinal: Negative for abdominal distention and abdominal pain.  Endocrine: Negative.   Genitourinary: Negative.   Musculoskeletal: Negative for back pain, gait problem and neck pain.  Skin: Negative.   Allergic/Immunologic: Negative.   Neurological: Negative for dizziness and light-headedness.  Hematological: Negative for adenopathy. Bruises/bleeds easily.  Psychiatric/Behavioral: Positive for sleep disturbance (sleeping on 1 pillow). Negative for dysphoric mood. The patient is not nervous/anxious.    Vitals:   05/07/21 1243  BP: 102/67  Pulse: 92  Resp: 18  SpO2: 100%  Weight: 190 lb 2 oz (86.2 kg)  Height: 5' 7"  (1.702 m)   Wt Readings from Last 3 Encounters:  05/07/21 190 lb 2 oz (86.2 kg)  04/24/21 187 lb 6.4 oz (85 kg)  04/11/21 198 lb (89.8 kg)   Lab Results  Component Value Date   CREATININE 0.77 04/30/2021   CREATININE 1.10 04/12/2021   CREATININE 0.98 04/11/2021    Physical Exam Vitals and nursing note reviewed.  Constitutional:      Appearance: Normal appearance.  HENT:     Head: Normocephalic and atraumatic.  Cardiovascular:     Rate and Rhythm: Normal rate and regular rhythm.  Pulmonary:     Effort: Pulmonary effort is normal. No respiratory distress.     Breath sounds: No wheezing or  rales.  Abdominal:     General: There is no distension.     Palpations: Abdomen is soft.     Tenderness: There is no abdominal tenderness.  Musculoskeletal:        General: No tenderness.     Cervical back: Normal range of motion.     Right lower leg: No edema.     Left lower leg: No edema.  Skin:    General: Skin is warm and dry.  Neurological:     General: No focal deficit present.     Mental Status: He is alert and oriented to person, place, and time.  Psychiatric:        Mood and Affect: Mood normal.        Behavior: Behavior normal.        Thought Content: Thought content normal.    Assessment & Plan:  1: Chronic heart failure with preserved ejection fraction with structural changes (LVH)- - NYHA class I - euvolemic today - weighing daily; reminded to call for an overnight weight gain of >2 pounds or a weekly  weight gain of >5 pounds - Weight up 8 pounds from last visit here 3 months ago - not adding salt to his food but admits that he's eating too much - on entresto but no room to titrate - consider adding jardiance at future visits if able - BMP 04/30/21 reviewed and showed sodium 136, potassium 4.4, creatinine 0.77 and GFR >60 - BNP 06/05/20 was 155.9  2: DM- - saw PCP @ [facility] 02/27/21 - glucose at home today was 120 - A1c 02/13/21 was 1.7%  3: Alcoholic cirrhosis- - says that he hasn't had any alcohol since June 2021. - saw GI (Vanga) 03/27/21; endoscopy done 01/03/21  4: PE- - saw hematology Rogue Bussing) 04/30/21 - no longer taking xarelto & says that he doesn't have to return to hematology  5: Recent hernia repair- - saw surgeon (P[place]on) 04/24/21   Medication bottles were reviewed.   Return in 6 months or sooner for any questions/problems before then.

## 2021-05-07 NOTE — Patient Instructions (Signed)
Continue weighing daily and call for an overnight weight gain of > 2 pounds or a weekly weight gain of >5 pounds. 

## 2021-05-10 ENCOUNTER — Other Ambulatory Visit: Payer: Self-pay | Admitting: Family

## 2021-05-10 ENCOUNTER — Other Ambulatory Visit: Payer: Self-pay

## 2021-05-10 MED ORDER — ENTRESTO 24-26 MG PO TABS
1.0000 | ORAL_TABLET | Freq: Two times a day (BID) | ORAL | 3 refills | Status: DC
  Filled 2021-05-10 – 2021-06-19 (×2): qty 180, 90d supply, fill #0
  Filled 2021-09-17: qty 180, 90d supply, fill #1

## 2021-05-15 ENCOUNTER — Other Ambulatory Visit: Payer: Self-pay

## 2021-05-21 ENCOUNTER — Other Ambulatory Visit: Payer: Self-pay

## 2021-05-21 MED FILL — Furosemide Tab 40 MG: ORAL | 90 days supply | Qty: 90 | Fill #0 | Status: AC

## 2021-06-11 ENCOUNTER — Other Ambulatory Visit: Payer: Self-pay

## 2021-06-13 ENCOUNTER — Other Ambulatory Visit: Payer: Self-pay

## 2021-06-18 ENCOUNTER — Other Ambulatory Visit: Payer: Self-pay

## 2021-06-19 ENCOUNTER — Other Ambulatory Visit: Payer: Self-pay

## 2021-06-20 ENCOUNTER — Other Ambulatory Visit: Payer: Self-pay

## 2021-06-20 ENCOUNTER — Ambulatory Visit: Payer: Medicaid Other | Admitting: Gerontology

## 2021-06-20 VITALS — BP 92/61 | HR 96 | Temp 96.8°F | Resp 16 | Wt 199.6 lb

## 2021-06-20 DIAGNOSIS — D649 Anemia, unspecified: Secondary | ICD-10-CM | POA: Insufficient documentation

## 2021-06-20 DIAGNOSIS — K219 Gastro-esophageal reflux disease without esophagitis: Secondary | ICD-10-CM

## 2021-06-20 DIAGNOSIS — Z09 Encounter for follow-up examination after completed treatment for conditions other than malignant neoplasm: Secondary | ICD-10-CM | POA: Insufficient documentation

## 2021-06-20 MED ORDER — PANTOPRAZOLE SODIUM 40 MG PO TBEC
40.0000 mg | DELAYED_RELEASE_TABLET | Freq: Every day | ORAL | 1 refills | Status: DC
Start: 2021-06-20 — End: 2021-10-15
  Filled 2021-06-20 – 2021-07-24 (×2): qty 90, 90d supply, fill #0

## 2021-06-20 NOTE — Progress Notes (Signed)
Established Patient Office Visit  Subjective:  Patient ID: Collin Byrd, male    DOB: [birth year]  Age: 60 y.o. MRN: [id number]  CC: No chief complaint on file.   HPI Collin Byrd is a 60 y/o male with history of Cirrhosis of the liver due to alcohol abuse, CHF presents for routine follow up visit and medication refill. He states that he's compliant with his medications and continues to make healthy lifestyle changes. He had Hernia repair procedure done by Dr Dahlia Byes on 04/11/21, and he reports that he's doing well. He was seen at the CHF clinic by Darylene Price FNP on 05/07/21. He denies chest pain, palpitation, abdominal distention, fatigue, palpitation, shortness of breath, dizziness and pedal edema. He will follow up at [facility] on 07/02/21 for Pterygium of right eye procedure. His Hemoglobin and Hematocrit done on 04/30/21 was 9.9g/dl and 30.3%. Overall, he states that he's doing well and offers no further complaint.  Past Medical History:  Diagnosis Date   Alcohol abuse    Ascites    Cataract    Cellulitis    CHF (congestive heart failure) ([place])    Cirrhosis ([place])    Diabetes mellitus without complication ([place])    History of methicillin resistant staphylococcus aureus (MRSA) 2016   Pneumonia 2021   Portal hypertension ([place])    Pulmonary embolus ([place])    Sepsis ([place])    Spontaneous bacterial peritonitis ([place])     Past Surgical History:  Procedure Laterality Date   CATARACT EXTRACTION     COLONOSCOPY WITH PROPOFOL N/A 01/03/2021   Procedure: COLONOSCOPY WITH PROPOFOL;  Surgeon: Lin Landsman, MD;  Location: [facility] ENDOSCOPY;  Service: Gastroenterology;  Laterality: N/A;   ESOPHAGOGASTRODUODENOSCOPY (EGD) WITH PROPOFOL N/A 01/03/2021   Procedure: ESOPHAGOGASTRODUODENOSCOPY (EGD) WITH PROPOFOL;  Surgeon: Lin Landsman, MD;  Location: [facility] ENDOSCOPY;  Service: Gastroenterology;  Laterality: N/A;   EYE SURGERY     INGUINAL HERNIA REPAIR Left 04/11/2021   Procedure: HERNIA REPAIR  INGUINAL ADULT, open, WITH DIAGNOSTIC LAPAROSCOPY, AND ROBOT ASSISTED;  Surgeon: Jules Husbands, MD;  Location: [facility] ORS;  Service: General;  Laterality: Left;  provider requesting 2 hours / 120 minutes for procedure.   PARACENTESIS  2021   TESTICULAR EXPLORATION Left 04/11/2021   Procedure: TESTICULAR EXPLORATION AND REMOVAL OF LEFT TESTICLE;  Surgeon: Billey Co, MD;  Location: [facility] ORS;  Service: Urology;  Laterality: Left;   UMBILICAL HERNIA REPAIR N/A 04/04/2021   Procedure: HERNIA REPAIR UMBILICAL ADULT, open;  Surgeon: Jules Husbands, MD;  Location: [facility] ORS;  Service: General;  Laterality: N/A;   XI ROBOTIC ASSISTED INGUINAL HERNIA REPAIR WITH MESH Left 04/04/2021   Procedure: XI ROBOTIC ASSISTED INGUINAL HERNIA REPAIR WITH MESH;  Surgeon: Jules Husbands, MD;  Location: [facility] ORS;  Service: General;  Laterality: Left;    No family history on file.  Social History   Socioeconomic History   Marital status: Married    Spouse name: Not on file   Number of children: Not on file   Years of education: Not on file   Highest education level: Not on file  Occupational History   Not on file  Tobacco Use   Smoking status: Some Days    Packs/day: 0.25    Pack years: 0.00    Types: Cigarettes   Smokeless tobacco: Never  Vaping Use   Vaping Use: Never used  Substance and Sexual Activity   Alcohol use: Not Currently   Drug use: Never   Sexual activity:  Not Currently  Other Topics Concern   Not on file  Social History Narrative   No working now; used to work housekeeping; 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 Smoking- 1-3 cig/day. Lives in [place]; with wife. No children.    Social Determinants of Health   Financial Resource Strain: Not on file  Food Insecurity: Not on file  Transportation Needs: Not on file  Physical Activity: Not on file  Stress: Not on file  Social Connections: Not on file  Intimate Partner Violence: Not on file    Outpatient Medications Prior to Visit   Medication Sig Dispense Refill   blood glucose meter kit and supplies KIT Dispense based on patient and insurance preference. Use up to four times daily as directed. (FOR ICD-9 250.00, 250.01). 1 each 0   folic acid (FOLVITE) 1 MG tablet TAKE ONE TABLET BY MOUTH EVERY DAY 30 tablet 2   furosemide (LASIX) 40 MG tablet TAKE ONE TABLET BY MOUTH EVERY DAY 90 tablet 3   omega-3 acid ethyl esters (LOVAZA) 1 g capsule TAKE 2 CAPSULES BY MOUTH IN THE MORNING AND TAKE 2 CAPSULES BY MOUTH AT BEDTIME. 120 capsule 1   sacubitril-valsartan (ENTRESTO) 24-26 MG Take 1 tablet by mouth 2 (two) times daily. 180 tablet 3   spironolactone (ALDACTONE) 100 MG tablet Take 100 mg by mouth 2 (two) times daily.     thiamine (VITAMIN B-1) 100 MG tablet TAKE ONE TABLET BY MOUTH EVERY DAY 30 tablet 3   pantoprazole (PROTONIX) 40 MG tablet TAKE ONE TABLET BY MOUTH EVERY DAY (Patient taking differently: Take 40 mg by mouth daily.) 90 tablet 1   No facility-administered medications prior to visit.    No Known Allergies  ROS Review of Systems  Constitutional: Negative.   Respiratory: Negative.    Cardiovascular: Negative.   Gastrointestinal: Negative.   Skin: Negative.   Neurological: Negative.   Psychiatric/Behavioral: Negative.       Objective:    Physical Exam HENT:     Head: Normocephalic and atraumatic.     Mouth/Throat:     Mouth: Mucous membranes are moist.  Eyes:     Extraocular Movements: Extraocular movements intact.     Conjunctiva/sclera: Conjunctivae normal.     Pupils: Pupils are equal, round, and reactive to light.  Cardiovascular:     Rate and Rhythm: Normal rate and regular rhythm.     Pulses: Normal pulses.     Heart sounds: Normal heart sounds.  Pulmonary:     Effort: Pulmonary effort is normal.     Breath sounds: Normal breath sounds.  Abdominal:     General: Bowel sounds are normal.     Palpations: Abdomen is soft.  Skin:    General: Skin is warm.  Neurological:     General:  No focal deficit present.     Mental Status: He is alert and oriented to person, place, and time. Mental status is at baseline.  Psychiatric:        Mood and Affect: Mood normal.        Behavior: Behavior normal.        Thought Content: Thought content normal.        Judgment: Judgment normal.    BP 92/61 (BP Location: Left Arm, Patient Position: Sitting, Cuff Size: Normal)   Pulse 96   Temp (!) 96.8 F (36 C)   Resp 16   Wt 199 lb 9.6 oz (90.5 kg)   SpO2 95%   BMI 31.26 kg/m  Wt  Readings from Last 3 Encounters:  06/20/21 199 lb 9.6 oz (90.5 kg)  05/07/21 190 lb 2 oz (86.2 kg)  04/24/21 187 lb 6.4 oz (85 kg)   Encouraged weight loss  Health Maintenance Due  Topic Date Due   COVID-19 Vaccine (1) Never done   Pneumococcal Vaccine 87-4 Years old (1 - PCV) Never done   TETANUS/TDAP  Never done   Zoster Vaccines- Shingrix (1 of 2) Never done    There are no preventive care reminders to display for this patient.  Lab Results  Component Value Date   TSH 0.773 06/05/2020   Lab Results  Component Value Date   WBC 11.3 (H) 04/30/2021   HGB 9.9 (L) 04/30/2021   HCT 30.3 (L) 04/30/2021   MCV 93.5 04/30/2021   PLT 271 04/30/2021   Lab Results  Component Value Date   NA 136 04/30/2021   K 4.4 04/30/2021   CO2 23 04/30/2021   GLUCOSE 108 (H) 04/30/2021   BUN 17 04/30/2021   CREATININE 0.77 04/30/2021   BILITOT 0.8 04/12/2021   ALKPHOS 46 04/12/2021   AST 17 04/12/2021   ALT 14 04/12/2021   PROT 6.6 04/12/2021   ALBUMIN 2.5 (L) 04/12/2021   CALCIUM 8.9 04/30/2021   ANIONGAP 12 04/30/2021   Lab Results  Component Value Date   CHOL 192 02/13/2021   Lab Results  Component Value Date   HDL 67 02/13/2021   Lab Results  Component Value Date   LDLCALC 114 (H) 02/13/2021   Lab Results  Component Value Date   TRIG 61 02/13/2021   Lab Results  Component Value Date   CHOLHDL 2.9 02/13/2021   Lab Results  Component Value Date   HGBA1C 5.4 02/13/2021       Assessment & Plan:     1. Gastroesophageal reflux disease without esophagitis -His acid reflux is under control, he will continue on current medication -Avoid spicy, fatty and fried food -Avoid sodas and sour juices -Avoid heavy meals -Avoid eating 4 hours before bedtime -Elevate head of bed at night - pantoprazole (PROTONIX) 40 MG tablet; Take 1 tablet (40 mg total) by mouth daily.  Dispense: 90 tablet; Refill: 1  2. Low hemoglobin and low hematocrit - He denies fatigue, shortness of breath, will recheck- CBC w/Diff; Future  3. Follow up - He will continue on his current treatment regimen, was advised to notify clinic or go to the ED for worsening symptoms.    Follow-up: Return in about 5 months (around 11/13/2021), or if symptoms worsen or fail to improve.    Tilda Samudio Jerold Coombe, NP

## 2021-06-20 NOTE — Patient Instructions (Signed)
[email or web address]">  Plan de alimentacin DASH DASH Eating Plan DASH es la sigla en ingls de "Enfoques Alimentarios para Detener la Hipertensin". El plan de alimentacin DASH ha demostrado: Bajar la presin arterial elevada (hipertensin). Reducir el riesgo de diabetes tipo 2, enfermedad cardaca y accidente cerebrovascular. Ayudar a perder peso. Consejos para seguir Photographer las etiquetas de los alimentos Verifique la cantidad de sal (sodio) por porcin en las etiquetas de los alimentos. Elija alimentos con menos del 5 por ciento del valor diario de sodio. Generalmente, los alimentos con menos de 300 miligramos (mg) de sodio por porcin se encuadran dentro de este plan alimentario. Para encontrar cereales integrales, busque la palabra "integral" como primera palabra en la lista de ingredientes. Al ir de compras Compre productos en los que en su etiqueta diga: "bajo contenido de sodio" o "sin agregado de sal". Compre alimentos frescos. Evite los alimentos enlatados y comidas precocidas o congeladas. Al cocinar Evite agregar sal cuando cocine. Use hierbas o aderezos sin sal, en lugar de sal de mesa o sal marina. Consulte al mdico o farmacutico antes de usar sustitutos de la sal. No fra los alimentos. A la hora de cocinar los alimentos opte por hornearlos, hervirlos, grillarlos, asarlos al horno y asarlos a Administrator, arts. Cocine con aceites cardiosaludables, como oliva, canola, aguacate, soja o girasol. Planificacin de las comidas  Consuma una dieta equilibrada, que incluya lo siguiente: 4 o ms porciones de frutas y 4 o ms porciones de Set designer. Trate de que medio plato de cada comida sea de frutas y verduras. De 6 a 8 porciones de [employer]. Menos de 6 onzas (170 g) de carne, aves o pescado Games developer. Una porcin de 3 onzas (85 g) de carne tiene casi el mismo tamao que un mazo de cartas. Un huevo  equivale a 1 onza (28 g). De 2 a 3 porciones de productos lcteos descremados por da. Una porcin es 1 taza (237 ml). 1 porcin de frutos secos, semillas o frijoles 5 veces por semana. De 2 a 3 porciones de grasas cardiosaludables. Las grasas saludables llamadas cidos grasos omega-3 se encuentran en alimentos como las nueces, las semillas de [place], las leches fortificadas y [place]. Estas grasas tambin se encuentran en los pescados de agua fra, como la sardina, el salmn y la caballa. Limite la cantidad que consume de: Alimentos enlatados o envasados. Alimentos con alto contenido de grasa trans, como algunos alimentos fritos. Alimentos con alto contenido de grasa saturada, como carne con grasa. Postres y otros dulces, bebidas azucaradas y otros alimentos con azcar agregada. Productos lcteos enteros. No le agregue sal a los alimentos antes de probarlos. No coma ms de 4 yemas de huevo por semana. Trate de comer al menos 2 comidas vegetarianas por semana. Consuma ms comida casera y menos de restaurante, de bares y comida rpida.  Estilo de vida Cuando coma en un restaurante, pida que preparen su comida con menos sal o, en lo posible, sin nada de sal. Si bebe alcohol: Limite la cantidad que bebe: De 0 a 1 medida por da para las mujeres que no estn embarazadas. De 0 a 2 medidas por da para los hombres. Est atento a la cantidad de alcohol que hay en las bebidas que toma. En los [place], una medida equivale a una botella de cerveza de 12 oz (355 ml), un vaso de vino de 5 oz (148 ml) o un vaso de una bebida alcohlica de alta graduacin  de 1 oz (44 ml). Informacin general Evite ingerir ms de 2300 mg de sal por da. Si tiene hipertensin, es posible que necesite reducir la ingesta de sodio a 1,500 mg por da. Trabaje con su mdico para mantener un peso saludable o perder [employer]. Pregntele cul es el peso recomendado para usted. Realice al menos 30 minutos de ejercicio que haga  que se acelere su corazn (ejercicio Arboriculturist) la [employer] de la [place]. Estas actividades pueden incluir caminar, nadar o andar en bicicleta. Trabaje con su mdico o nutricionista para ajustar su plan alimentario a sus necesidades calricas personales. Qu alimentos debo comer? Frutas Todas las frutas frescas, congeladas o disecadas. Frutas enlatadas en jugonatural (sin agregado de azcar). Verduras Verduras frescas o congeladas (crudas, al vapor, asadas o grilladas). Jugos de tomate y verduras con bajo contenido de sodio o reducidos en sodio. Salsa y pasta de tomate con bajo contenido de sodio o reducidas en sodio. Verdurasenlatadas con bajo contenido de sodio o reducidas en sodio. Granos Pan de salvado o integral. Pasta de salvado o integral. Arroz integral. Avena. Quinua. Trigo burgol. Cereales integrales y con bajo contenido de sodio. Pan pita. Galletitas de [place] con bajo contenido de [place] y [place]. Tortillas deharina integral. Carnes y otras protenas Pollo o pavo sin piel. Carne de pollo o de [place]. Cerdo desgrasado. Pescado y [employer]. Claras de huevo. Porotos, guisantes o lentejas secos. Frutos secos, mantequilla de frutos secos y semillas sin sal. Frijoles enlatados sin sal. Cortes de carne vacuna magra, desgrasada. Carne precocida Hoyle Barr y baja en sodio, como embutidos o panes de carne. Lcteos Leche descremada (1 %) o descremada. Quesos reducidos en grasa, con bajo contenido de grasa o descremados. Queso blanco o ricota sin grasa, con bajo contenido de [place]. Yogur semidescremado o descremado. Queso con bajo contenidode grasa y [place]. Grasas y [employer] untables que no contengan grasas trans. Aceite vegetal. Lubertha Basque y aderezos para ensaladas livianos, reducidos en grasa o con bajo contenido de grasas (reducidos en sodio). Aceite de canola,crtamo, oliva, aguacate, soja y [place]. Aguacate. Alios y condimentos Hierbas. Especias. Mezclas de condimentos  sin sal. Otros alimentos Palomitas de maz y pretzels sin sal. Dulces con bajo contenido de grasas. Es posible que los productos que se enumeran ms [place] no constituyan una lista completa de los alimentos y las bebidas que puede tomar. Consulte a un nutricionista para obtener ms informacin. Qu alimentos debo evitar? Lambert Mody Fruta enlatada en almbar liviano o espeso. Frutas cocidas en aceite. Frutascon salsa de crema o [place]. Verduras Verduras con crema o fritas. Verduras en [place]. Verduras enlatadas regulares (que no sean con bajo contenido de sodio o reducidas en sodio). Pasta y salsa de tomates enlatadas regulares (que no sean con bajo contenido de sodio o reducidas en sodio). Jugos de tomate y verduras regulares (que no sean conbajo contenido de sodio o reducidos en sodio). Pepinillos. Aceitunas. Granos Productos de panificacin hechos con grasa, como medialunas, magdalenas yalgunos panes. Comidas con arroz o pasta seca listas para usar. Carnes y otras protenas Cortes de carne con alto contenido de Lobbyist. Costillas. Carne frita. [place]. [place], salame y otras carnes precocidas o curadas, como embutidos o panes de carne. Grasa de la espalda del cerdo (panceta). [place]. Frutos secos y semillas con sal. Frijoles enlatados con agregado de sal. Pescado enlatado o ahumado. Huevos enteros o yemas. Pollo opavo con piel. Lcteos Leche entera o al 2 %, crema y mitad leche y mitad crema. Marisue Humble  crema entero o con toda su grasa. Yogur entero o endulzado. Quesos con toda su grasa. Sustitutos de cremas no lcteas. Coberturas batidas. Quesos para untar y quesosprocesados. Grasas y [employer]. Margarina en barra. [place]. Lardo. Mantequilla clarificada. Grasa de panceta. Aceites tropicales como aceite de coco, palmisteo palma. Alios y condimentos Sal de cebolla, sal de ajo, sal condimentada, sal de mesa y sal marina. Salsa Worcestershire. Salsa trtara.  Salsa barbacoa. Salsa teriyaki. Salsa de soja, incluso la que tiene contenido reducido de [place]. Salsa de carne. Salsas en lata y envasadas. Salsa de pescado. Salsa de [place]. Salsa rosada. Rbanos picantes comprados en tiendas. [place]. Mostaza. Saborizantes y tiernizantes para carne. Caldo en cubitos. Salsas picantes. Adobos preelaborados o envasados. Aderezos para tacos preelaborados o envasados. Salsas de pepinillos.Aderezos comunes para ensalada. Otros alimentos Palomitas de maz y pretzels con sal. Es posible que los productos que se enumeran ms arriba no constituyan una lista completa de los alimentos y las bebidas que Nurse, adult. Consulte a un nutricionista para obtener ms informacin. Dnde buscar ms informacin National Heart, Lung, and Blood Institute ([place], los Pulmones y Herbalist): https://wilson-eaton.com/ American Heart Association (Asociacin Estadounidense del Corazn): www.heart.org Academy of Nutrition and Dietetics (Academia de Nutricin y Information systems manager): www.eatright.[place] ([place]): www.kidney.org Resumen El plan de alimentacin DASH ha demostrado bajar la presin arterial elevada (hipertensin). Tambin puede reducir UnitedHealth de diabetes tipo 2, enfermedad cardaca y accidente cerebrovascular. Cuando siga el plan de alimentacin DASH, trate de comer ms frutas frescas y verduras, cereales integrales, carnes magras, lcteos descremados y grasas cardiosaludables. Con el plan de alimentacin DASH, deber limitar el consumo de sal (sodio) a 2,300 mg por da. Si tiene hipertensin, es posible que necesite reducir la ingesta de sodio a 1,500 mg por da. Trabaje con su mdico o nutricionista para ajustar su plan alimentario a sus necesidades calricas personales. Esta informacin no tiene Marine scientist el consejo del mdico. Asegresede hacerle al mdico cualquier pregunta que tenga. Document Revised: 01/19/2020  Document Reviewed: 01/19/2020 Elsevier Patient Education  2022 [employer].

## 2021-07-12 ENCOUNTER — Other Ambulatory Visit: Payer: Self-pay

## 2021-07-12 ENCOUNTER — Other Ambulatory Visit: Payer: Self-pay | Admitting: Gerontology

## 2021-07-15 ENCOUNTER — Other Ambulatory Visit: Payer: Self-pay

## 2021-07-16 ENCOUNTER — Other Ambulatory Visit: Payer: Self-pay

## 2021-07-16 ENCOUNTER — Other Ambulatory Visit: Payer: Self-pay | Admitting: Gerontology

## 2021-07-16 MED FILL — Omega-3-acid Ethyl Esters Cap 1 GM: ORAL | Qty: 120 | Fill #0 | Status: CN

## 2021-07-17 ENCOUNTER — Other Ambulatory Visit: Payer: Self-pay

## 2021-07-19 ENCOUNTER — Other Ambulatory Visit: Payer: Self-pay

## 2021-07-24 ENCOUNTER — Telehealth: Payer: Self-pay | Admitting: Pharmacist

## 2021-07-24 ENCOUNTER — Other Ambulatory Visit: Payer: Self-pay

## 2021-07-24 NOTE — Telephone Encounter (Signed)
Patient approved for medication assistance at [facility] until 04/28/22, as long as eligibility criteria continues to be met. Debra Cheek Administrative Assistant [facility] 

## 2021-07-25 ENCOUNTER — Other Ambulatory Visit: Payer: Self-pay

## 2021-07-29 ENCOUNTER — Ambulatory Visit (INDEPENDENT_AMBULATORY_CARE_PROVIDER_SITE_OTHER): Payer: Self-pay | Admitting: Surgery

## 2021-07-29 ENCOUNTER — Other Ambulatory Visit: Payer: Self-pay

## 2021-07-29 ENCOUNTER — Encounter: Payer: Self-pay | Admitting: Surgery

## 2021-07-29 VITALS — BP 90/56 | HR 96 | Temp 98.8°F | Ht 67.0 in | Wt 201.2 lb

## 2021-07-29 DIAGNOSIS — K4040 Unilateral inguinal hernia, with gangrene, not specified as recurrent: Secondary | ICD-10-CM

## 2021-07-29 NOTE — Progress Notes (Signed)
Outpatient Surgical Follow Up  07/29/2021  Collin Byrd is an 60 y.o. male.   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  Initially status post robotic approach and had an early recurrence requiring open repair w orchiectomy. That was 3 1/2 months ago.  He is doing very well now.  No fevers no chills no pains.  Tolerating diet and gaining weight.  Ambulating well.  Normal bowel movements.  Past Medical History:  Diagnosis Date   Alcohol abuse    Ascites    Cataract    Cellulitis    CHF (congestive heart failure) ([place])    Cirrhosis ([place])    Diabetes mellitus without complication ([place])    History of methicillin resistant staphylococcus aureus (MRSA) 2016   Pneumonia 2021   Portal hypertension ([place])    Pulmonary embolus ([place])    Sepsis ([place])    Spontaneous bacterial peritonitis ([place])     Past Surgical History:  Procedure Laterality Date   CATARACT EXTRACTION     COLONOSCOPY WITH PROPOFOL N/A 01/03/2021   Procedure: COLONOSCOPY WITH PROPOFOL;  Surgeon: Lin Landsman, MD;  Location: [facility] ENDOSCOPY;  Service: Gastroenterology;  Laterality: N/A;   ESOPHAGOGASTRODUODENOSCOPY (EGD) WITH PROPOFOL N/A 01/03/2021   Procedure: ESOPHAGOGASTRODUODENOSCOPY (EGD) WITH PROPOFOL;  Surgeon: Lin Landsman, MD;  [facility] ENDOSCOPY;  Service: Gastroenterology;  Laterality: N/A;   EYE SURGERY     INGUINAL HERNIA REPAIR Left 04/11/2021   Procedure: HERNIA REPAIR INGUINAL ADULT, open, WITH DIAGNOSTIC LAPAROSCOPY, AND ROBOT ASSISTED;  Surgeon: Jules Husbands, MD;  Location: [facility] ORS;  Service: General;  Laterality: Left;  provider requesting 2 hours / 120 minutes for procedure.   PARACENTESIS  2021   TESTICULAR EXPLORATION Left 04/11/2021   Procedure: TESTICULAR EXPLORATION AND REMOVAL OF LEFT TESTICLE;  Surgeon:  Billey Co, MD;  Location: [facility] ORS;  Service: Urology;  Laterality: Left;   UMBILICAL HERNIA REPAIR N/A 04/04/2021   Procedure: HERNIA REPAIR UMBILICAL ADULT, open;  Surgeon: Jules Husbands, MD;  Location: [facility] ORS;  Service: General;  Laterality: N/A;   XI ROBOTIC ASSISTED INGUINAL HERNIA REPAIR WITH MESH Left 04/04/2021   Procedure: XI ROBOTIC ASSISTED INGUINAL HERNIA REPAIR WITH MESH;  Surgeon: Jules Husbands, MD;  Location: [facility] ORS;  Service: General;  Laterality: Left;    History reviewed. No pertinent family history.  Social History:  reports that he has been smoking cigarettes. He has been smoking an average of .25 packs per day. He has never used smokeless tobacco. He reports previous alcohol use. He reports that he does not use drugs.  Allergies: No Known Allergies  Medications reviewed.    ROS Full ROS performed and is otherwise negative other than what is stated in HPI   BP (!) 90/56   Pulse 96   Temp 98.8 F (37.1 C) (Oral)   Ht '5\' 7"'$  (1.702 m)   Wt 201 lb 3.2 oz (91.3 kg)   SpO2 95%   BMI 31.51 kg/m   Physical Exam NAD ABd: soft, nt, laparoscopic scars, Left inguinal incision healed, no infection Ext: no edema   Assessment/Plan: No evidence of recurrent after open repair of a giant left inguinal hernia in a patient with cirrhosis.  No surgical needs at this time.  We will follow him up on a as needed basis  Greater than 50% of the 20 minutes  visit was spent in counseling/coordination of care   Caroleen Hamman, MD [place] Surgeon

## 2021-07-29 NOTE — Patient Instructions (Signed)
Please call our office if you have any questions or concerns.  

## 2021-08-06 ENCOUNTER — Other Ambulatory Visit: Payer: Self-pay

## 2021-08-06 ENCOUNTER — Other Ambulatory Visit: Payer: Self-pay | Admitting: Family

## 2021-08-06 ENCOUNTER — Other Ambulatory Visit: Payer: Medicaid Other

## 2021-08-06 ENCOUNTER — Ambulatory Visit: Payer: Medicaid Other | Admitting: Internal Medicine

## 2021-08-06 ENCOUNTER — Other Ambulatory Visit: Payer: Self-pay | Admitting: Gerontology

## 2021-08-06 MED ORDER — SPIRONOLACTONE 100 MG PO TABS
100.0000 mg | ORAL_TABLET | Freq: Two times a day (BID) | ORAL | 3 refills | Status: DC
  Filled 2021-08-06: qty 180, 90d supply, fill #0

## 2021-08-07 ENCOUNTER — Other Ambulatory Visit: Payer: Self-pay

## 2021-08-08 ENCOUNTER — Other Ambulatory Visit: Payer: Self-pay

## 2021-08-08 ENCOUNTER — Other Ambulatory Visit: Payer: Self-pay | Admitting: Gerontology

## 2021-08-08 DIAGNOSIS — F1011 Alcohol abuse, in remission: Secondary | ICD-10-CM

## 2021-08-08 MED ORDER — FOLIC ACID 1 MG PO TABS
1.0000 mg | ORAL_TABLET | Freq: Every day | ORAL | 2 refills | Status: DC
  Filled 2021-08-08: qty 30, 30d supply, fill #0
  Filled 2021-09-11: qty 30, 30d supply, fill #1

## 2021-08-08 MED FILL — Omega-3-acid Ethyl Esters Cap 1 GM: ORAL | 30 days supply | Qty: 120 | Fill #0 | Status: AC

## 2021-08-26 ENCOUNTER — Other Ambulatory Visit: Payer: Self-pay

## 2021-08-26 MED FILL — Furosemide Tab 40 MG: ORAL | 90 days supply | Qty: 90 | Fill #1 | Status: AC

## 2021-08-29 ENCOUNTER — Other Ambulatory Visit: Payer: Self-pay

## 2021-08-29 ENCOUNTER — Ambulatory Visit: Payer: Medicaid Other | Admitting: Gastroenterology

## 2021-08-29 ENCOUNTER — Ambulatory Visit: Payer: Medicaid Other

## 2021-08-29 ENCOUNTER — Encounter: Payer: Self-pay | Admitting: Gastroenterology

## 2021-08-29 DIAGNOSIS — Z1211 Encounter for screening for malignant neoplasm of colon: Secondary | ICD-10-CM

## 2021-08-29 DIAGNOSIS — D509 Iron deficiency anemia, unspecified: Secondary | ICD-10-CM

## 2021-08-29 DIAGNOSIS — K7031 Alcoholic cirrhosis of liver with ascites: Secondary | ICD-10-CM

## 2021-08-29 DIAGNOSIS — Z23 Encounter for immunization: Secondary | ICD-10-CM

## 2021-08-29 MED ORDER — GOLYTELY 236 G PO SOLR
4000.0000 mL | Freq: Once | ORAL | 0 refills | Status: AC
  Filled 2021-08-29 (×2): qty 4000, 1d supply, fill #0

## 2021-08-29 NOTE — Progress Notes (Signed)
Collin Darby, MD [address]  [place]  [place], [place] [address]  Main: [phone]  Fax: [phone]    Gastroenterology Consultation  Referring Provider:     Langston Reusing, NP Primary Care Physician:  Langston Reusing, NP Primary Gastroenterologist:  Dr. Cephas Byrd Reason for Consultation:     Cirrhosis of liver        HPI:   Collin Byrd is a 60 y.o. male referred by Dr. Langston Reusing, NP  for consultation & management of cirrhosis of liver.  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  He also has history of pneumonia and pulmonary embolism, is currently on anticoagulation.  Patient is followed by hematologist, Dr. Rogue Bussing.  Patient underwent therapeutic paracentesis in June 2021, was diagnosed with SBP as well as fluid analysis consistent with portal hypertension.  He was treated with antibiotics and was discharged home on Lasix as well as spironolactone.  Patient stopped drinking alcohol since June and is adherent to his medications.  He reports doing well since then.  He denies swelling of abdomen, distention of abdomen, swelling of legs.  He denies black stools, rectal bleeding, hematemesis or abdominal pain.  His most recent labs revealed mild normocytic anemia, normal LFTs and renal function.  He is currently on oral iron, thiamine and folate. He is on disability, currently not working  CT chest 11/01/2020 revealed resolution of prior pulmonary embolus  Follow-up visit 02/26/2021 Patient is doing well from cirrhosis standpoint.  He does not have swelling of legs or abdominal distention.  He is taking spironolactone 100 g daily.  He underwent upper endoscopy which revealed mild gastropathy only.  Gastric biopsies also revealed chronic active gastritis.  Colonoscopy was incomplete due to presence of large left inguinal hernia.  Patient is referred to general surgery, has appointment with Dr.  Dahlia Byes on 3/9  Follow-up visit 08/29/2021 Patient is doing well from cirrhosis standpoint.  He underwent repair of incarcerated inguinal hernia in 03/2021.  Patient reports that he has been doing well and recovered well from surgery.  He denies any swelling of legs, abdominal distention.  He is gaining weight and tolerating diet well.  He denies any constipation.  He denies any alcohol use  NSAIDs: None  Antiplts/Anticoagulants/Anti thrombotics: Completed the treatment for Xarelto for history of PE after 6 months of anticoagulation  GI Procedures:  EGD and colonoscopy 01/03/2021 - Normal duodenal bulb and second portion of the duodenum. - Portal hypertensive gastropathy. Biopsied. - A single gastric polyp. Biopsied. Clip (MR conditional) was placed. - Small (< 5 mm) esophageal varices. - Esophagogastric landmarks identified. - Normal proximal esophagus, mid esophagus and gastroesophageal junction.  Procedure aborted due to large left inguinal hernia, advanced to transverse colon only The perianal and digital rectal examinations were normal. Pertinent negatives include normal sphincter tone and no palpable rectal lesions. Two sessile polyps were found in the descending colon and transverse colon. The polyps were 3 to 5 mm in size. These polyps were removed with a cold snare. Resection and retrieval were complete. Estimated blood loss: none. Non-bleeding external and internal hemorrhoids were found during retroflexion. The hemorrhoids were large.  DIAGNOSIS:  A. STOMACH; COLD BIOPSY:  - MODERATE CHRONIC ACTIVE GASTRITIS.  - ANTRAL MUCOSA WITH FOCAL INTESTINAL METAPLASIA.  - SIDEROSIS.  - SEE COMMENT.  - NEGATIVE FOR H.PYLORI, DYSPLASIA AND MALIGNANCY.   B.  STOMACH POLYPS; COLD BIOPSY:  - INFLAMED POLYPOID FRAGMENT  OF MUCOSA WITH REACTIVE FOVEOLAR  HYPERPLASIA.  - NEGATIVE FOR H.PYLORI, CMV, DYSPLASIA AND MALIGNANCY.   C.  COLON POLYP X3, DESCENDING; COLD SNARE:  - TUBULAR ADENOMA  (MULTIPLE FRAGMENTS).  - NEGATIVE FOR HIGH-GRADE DYSPLASIA AND MALIGNANCY.   He denies family history of GI malignancy, liver cancer  Past Medical History:  Diagnosis Date   Alcohol abuse    Ascites    Cataract    Cellulitis    CHF (congestive heart failure) ([place])    Cirrhosis ([place])    Diabetes mellitus without complication ([place])    History of methicillin resistant staphylococcus aureus (MRSA) 2016   Pneumonia 2021   Portal hypertension ([place])    Pulmonary embolus ([place])    Sepsis ([place])    Spontaneous bacterial peritonitis ([place])     Past Surgical History:  Procedure Laterality Date   CATARACT EXTRACTION     COLONOSCOPY WITH PROPOFOL N/A 01/03/2021   Procedure: COLONOSCOPY WITH PROPOFOL;  Surgeon: Lin Landsman, MD;  Location: [facility] ENDOSCOPY;  Service: Gastroenterology;  Laterality: N/A;   ESOPHAGOGASTRODUODENOSCOPY (EGD) WITH PROPOFOL N/A 01/03/2021   Procedure: ESOPHAGOGASTRODUODENOSCOPY (EGD) WITH PROPOFOL;  Surgeon: Lin Landsman, MD;  [facility] ENDOSCOPY;  Service: Gastroenterology;  Laterality: N/A;   EYE SURGERY     INGUINAL HERNIA REPAIR Left 04/11/2021   Procedure: HERNIA REPAIR INGUINAL ADULT, open, WITH DIAGNOSTIC LAPAROSCOPY, AND ROBOT ASSISTED;  Surgeon: Jules Husbands, MD;  Location: [facility] ORS;  Service: General;  Laterality: Left;  provider requesting 2 hours / 120 minutes for procedure.   PARACENTESIS  2021   TESTICULAR EXPLORATION Left 04/11/2021   Procedure: TESTICULAR EXPLORATION AND REMOVAL OF LEFT TESTICLE;  Surgeon: Billey Co, MD;  Location: [facility] ORS;  Service: Urology;  Laterality: Left;   UMBILICAL HERNIA REPAIR N/A 04/04/2021   Procedure: HERNIA REPAIR UMBILICAL ADULT, open;  Surgeon: Jules Husbands, MD;  Location: [facility] ORS;  Service: General;  Laterality: N/A;   XI ROBOTIC ASSISTED INGUINAL HERNIA REPAIR WITH MESH Left 04/04/2021   Procedure: XI ROBOTIC ASSISTED INGUINAL HERNIA REPAIR WITH MESH;  Surgeon: Jules Husbands, MD;  Location: [facility]  ORS;  Service: General;  Laterality: Left;    Current Outpatient Medications:    blood glucose meter kit and supplies KIT, Dispense based on patient and insurance preference. Use up to four times daily as directed. (FOR ICD-9 250.00, 250.01)., Disp: 1 each, Rfl: 0   folic acid (FOLVITE) 1 MG tablet, Take 1 tablet (1 mg total) by mouth daily., Disp: 30 tablet, Rfl: 2   furosemide (LASIX) 40 MG tablet, TAKE ONE TABLET BY MOUTH EVERY DAY, Disp: 90 tablet, Rfl: 3   omega-3 acid ethyl esters (LOVAZA) 1 g capsule, TAKE 2 CAPSULES BY MOUTH IN THE MORNING AND TAKE 2 CAPSULES BY MOUTH AT BEDTIME., Disp: 120 capsule, Rfl: 1   pantoprazole (PROTONIX) 40 MG tablet, Take 1 tablet (40 mg total) by mouth daily., Disp: 90 tablet, Rfl: 1   polyethylene glycol (GOLYTELY) 236 g solution, Mix and take 4,000 mLs by mouth once for 1 dose as directed., Disp: 4000 mL, Rfl: 0   sacubitril-valsartan (ENTRESTO) 24-26 MG, Take 1 tablet by mouth 2 (two) times daily., Disp: 180 tablet, Rfl: 3   spironolactone (ALDACTONE) 100 MG tablet, Take 1 tablet (100 mg total) by mouth 2 (two) times daily., Disp: 180 tablet, Rfl: 3   thiamine (VITAMIN B-1) 100 MG tablet, TAKE ONE TABLET BY MOUTH EVERY DAY, Disp: 30 tablet, Rfl: 3  History reviewed. No pertinent  family history.   Social History   Tobacco Use   Smoking status: Some Days    Packs/day: 0.25    Types: Cigarettes   Smokeless tobacco: Never  Vaping Use   Vaping Use: Never used  Substance Use Topics   Alcohol use: Not Currently   Drug use: Never    Allergies as of 08/29/2021   (No Known Allergies)    Review of Systems:    All systems reviewed and negative except where noted in HPI.   Physical Exam:  There were no vitals taken for this visit. No LMP for male patient.  General:   Alert,  Well-developed, well-nourished, pleasant and cooperative in NAD Head:  Normocephalic and atraumatic. Eyes:  Sclera clear, no icterus.   Conjunctiva pink.  Congested right  eye Ears:  Normal auditory acuity. Nose:  No deformity, discharge, or lesions. Mouth:  No deformity or lesions,oropharynx pink & moist. Neck:  Supple; no masses or thyromegaly. Lungs:  Respirations even and unlabored.  Clear throughout to auscultation.   No wheezes, crackles, or rhonchi. No acute distress. Heart:  Regular rate and rhythm; no murmurs, clicks, rubs, or gallops. Abdomen:  Normal bowel sounds. Soft, non-tender and non-distended without masses, hepatosplenomegaly, large left inguinal hernia noted.  No guarding or rebound tenderness.   Rectal: Not performed Msk:  Symmetrical without gross deformities. Good, equal movement & strength bilaterally. Pulses:  Normal pulses noted. Extremities:  No clubbing or edema.  No cyanosis. Neurologic:  Alert and oriented x3;  grossly normal neurologically. Skin:  Intact without significant lesions or rashes. No jaundice. Psych:  Alert and cooperative. Normal mood and affect.  Imaging Studies: Reviewed  Assessment and Plan:   Kiandre Spagnolo is a 60 y.o. Hispanic male with diabetes, decompensated alcoholic cirrhosis with ascites, history of SBP, pneumonia and pulmonary embolism s/p 6 months of anticoagulation on Xarelto is seen in consultation for cirrhosis  Alcoholic cirrhosis of liver, child Pugh class B, low meld Viral hepatitis panel negative, s/p Twinrix vaccine x3 doses Evidence of iron deficiency anemia, likely from surgery.  Most recent hemoglobin in 04/2021 is 9.9.  Recheck CBC, iron panel today Ascites: Currently euvolemic History of SBP, s/p therapeutic paracentesis in 6/21, consistent with portal hypertension Continue spironolactone 100 mg daily.  Patient is currently euvolemic Continue low-sodium diet No evidence of esophageal or gastric varices HCC screening: AFP levels normal and CT did not reveal liver lesions in 6/21 and 1422 PSE: None Continue to remain abstinent from alcohol use Strongly advised patient to follow healthy  diet, healthy eating habits and exercise, not to gain weight  Colon cancer screening Incomplete colonoscopy due to presence of large left inguinal hernia Given that his inguinal hernia has been repaired, recommend repeat colonoscopy with pediatric colonoscope  Moderate chronic active gastritis H. pylori IgG was positive, s/p treatment for H. pylori infection with triple therapy in 02/2021   Follow up in 6 months   Collin Darby, MD

## 2021-08-30 LAB — IRON,TIBC AND FERRITIN PANEL
Ferritin: 20 ng/mL — ABNORMAL LOW (ref 30–400)
Iron Saturation: 30 % (ref 15–55)
Iron: 104 ug/dL (ref 38–169)
Total Iron Binding Capacity: 352 ug/dL (ref 250–450)
UIBC: 248 ug/dL (ref 111–343)

## 2021-08-30 LAB — COMPREHENSIVE METABOLIC PANEL
ALT: 16 IU/L (ref 0–44)
AST: 15 IU/L (ref 0–40)
Albumin/Globulin Ratio: 1.3 (ref 1.2–2.2)
Albumin: 4.2 g/dL (ref 3.8–4.9)
Alkaline Phosphatase: 72 IU/L (ref 44–121)
BUN/Creatinine Ratio: 29 — ABNORMAL HIGH (ref 9–20)
BUN: 30 mg/dL — ABNORMAL HIGH (ref 6–24)
Bilirubin Total: 0.9 mg/dL (ref 0.0–1.2)
CO2: 19 mmol/L — ABNORMAL LOW (ref 20–29)
Calcium: 9.4 mg/dL (ref 8.7–10.2)
Chloride: 101 mmol/L (ref 96–106)
Creatinine, Ser: 1.03 mg/dL (ref 0.76–1.27)
Globulin, Total: 3.2 g/dL (ref 1.5–4.5)
Glucose: 89 mg/dL (ref 65–99)
Potassium: 4.9 mmol/L (ref 3.5–5.2)
Sodium: 137 mmol/L (ref 134–144)
Total Protein: 7.4 g/dL (ref 6.0–8.5)
eGFR: 84 mL/min/{1.73_m2} (ref >59)

## 2021-08-30 LAB — CBC
Hematocrit: 34.5 % — ABNORMAL LOW (ref 37.5–51.0)
Hemoglobin: 11.8 g/dL — ABNORMAL LOW (ref 13.0–17.7)
MCH: 30.3 pg (ref 26.6–33.0)
MCHC: 34.2 g/dL (ref 31.5–35.7)
MCV: 89 fL (ref 79–97)
Platelets: 230 10*3/uL (ref 150–450)
RBC: 3.89 x10E6/uL — ABNORMAL LOW (ref 4.14–5.80)
RDW: 14.5 % (ref 11.6–15.4)
WBC: 9.3 10*3/uL (ref 3.4–10.8)

## 2021-09-03 ENCOUNTER — Telehealth: Payer: Self-pay | Admitting: Pharmacist

## 2021-09-03 ENCOUNTER — Other Ambulatory Visit: Payer: Self-pay

## 2021-09-03 ENCOUNTER — Telehealth: Payer: Self-pay

## 2021-09-03 MED ORDER — FUSION PLUS PO CAPS
1.0000 | ORAL_CAPSULE | Freq: Every day | ORAL | 0 refills | Status: DC
  Filled 2021-09-03: qty 30, fill #0

## 2021-09-03 NOTE — Telephone Encounter (Signed)
Called and left a message for call back sent medication to pharmacy

## 2021-09-03 NOTE — Telephone Encounter (Signed)
09/03/2021 10:57:59 AM - Delene Loll refill called to [employer]  -- Elmer Picker - Tuesday, September 03, 2021 10:57 AM -- Hulen Skains [employer] to refill [place] 24/26, allow 2-3 business days for [place] to receive at our address.

## 2021-09-03 NOTE — Telephone Encounter (Signed)
Called patient and he verbalized understanding.

## 2021-09-03 NOTE — Telephone Encounter (Signed)
-----   Message from Lin Landsman, MD sent at 08/31/2021 12:08 PM EDT ----- Please inform patient that has iron deficiency anemia.  Recommend fusion plus daily for 1 month  RV

## 2021-09-04 ENCOUNTER — Other Ambulatory Visit: Payer: Self-pay

## 2021-09-09 ENCOUNTER — Other Ambulatory Visit: Payer: Self-pay

## 2021-09-10 ENCOUNTER — Other Ambulatory Visit: Payer: Self-pay

## 2021-09-11 ENCOUNTER — Other Ambulatory Visit: Payer: Self-pay | Admitting: Gerontology

## 2021-09-11 ENCOUNTER — Other Ambulatory Visit: Payer: Self-pay

## 2021-09-11 MED FILL — Thiamine Mononitrate Tab 100 MG: ORAL | 30 days supply | Qty: 30 | Fill #0 | Status: AC

## 2021-09-13 ENCOUNTER — Other Ambulatory Visit: Payer: Self-pay

## 2021-09-17 ENCOUNTER — Other Ambulatory Visit: Payer: Self-pay

## 2021-09-18 ENCOUNTER — Other Ambulatory Visit: Payer: Self-pay

## 2021-09-18 ENCOUNTER — Ambulatory Visit: Payer: Medicaid Other | Admitting: Anesthesiology

## 2021-09-18 ENCOUNTER — Ambulatory Visit
Admission: RE | Admit: 2021-09-18 | Discharge: 2021-09-18 | Disposition: A | Discharge status: Home / Self Care | Payer: Medicaid Other | Attending: Gastroenterology | Admitting: Gastroenterology

## 2021-09-18 ENCOUNTER — Encounter
Admission: RE | Disposition: A | Discharge status: Home / Self Care | Payer: Self-pay | Source: Home / Self Care | Attending: Gastroenterology

## 2021-09-18 ENCOUNTER — Encounter: Payer: Self-pay | Admitting: Gastroenterology

## 2021-09-18 DIAGNOSIS — D122 Benign neoplasm of ascending colon: Secondary | ICD-10-CM | POA: Diagnosis not present

## 2021-09-18 DIAGNOSIS — K648 Other hemorrhoids: Secondary | ICD-10-CM | POA: Diagnosis not present

## 2021-09-18 DIAGNOSIS — D124 Benign neoplasm of descending colon: Secondary | ICD-10-CM | POA: Diagnosis not present

## 2021-09-18 DIAGNOSIS — K5730 Diverticulosis of large intestine without perforation or abscess without bleeding: Secondary | ICD-10-CM | POA: Diagnosis not present

## 2021-09-18 DIAGNOSIS — D120 Benign neoplasm of cecum: Secondary | ICD-10-CM | POA: Insufficient documentation

## 2021-09-18 DIAGNOSIS — K635 Polyp of colon: Secondary | ICD-10-CM

## 2021-09-18 DIAGNOSIS — Z1211 Encounter for screening for malignant neoplasm of colon: Secondary | ICD-10-CM | POA: Insufficient documentation

## 2021-09-18 HISTORY — PX: COLONOSCOPY WITH PROPOFOL: SHX5780

## 2021-09-18 LAB — GLUCOSE, CAPILLARY: Glucose-Capillary: 112 mg/dL — ABNORMAL HIGH (ref 70–99)

## 2021-09-18 SURGERY — COLONOSCOPY WITH PROPOFOL
Anesthesia: General

## 2021-09-18 MED ORDER — PHENYLEPHRINE HCL (PRESSORS) 10 MG/ML IV SOLN
INTRAVENOUS | Status: DC | PRN
  Administered 2021-09-18: 200 ug via INTRAVENOUS
  Administered 2021-09-18 (×3): 150 ug via INTRAVENOUS
  Administered 2021-09-18: 200 ug via INTRAVENOUS

## 2021-09-18 MED ORDER — DEXMEDETOMIDINE HCL IN NACL 200 MCG/50ML IV SOLN
INTRAVENOUS | Status: DC | PRN
  Administered 2021-09-18: 12 ug via INTRAVENOUS
  Administered 2021-09-18: 8 ug via INTRAVENOUS

## 2021-09-18 MED ORDER — EPHEDRINE SULFATE 50 MG/ML IJ SOLN
INTRAMUSCULAR | Status: DC | PRN
  Administered 2021-09-18: 10 mg via INTRAVENOUS

## 2021-09-18 MED ORDER — PROPOFOL 10 MG/ML IV BOLUS
INTRAVENOUS | Status: DC | PRN
  Administered 2021-09-18: 40 mg via INTRAVENOUS
  Administered 2021-09-18: 80 mg via INTRAVENOUS

## 2021-09-18 MED ORDER — LIDOCAINE HCL (CARDIAC) PF 100 MG/5ML IV SOSY
PREFILLED_SYRINGE | INTRAVENOUS | Status: DC | PRN
  Administered 2021-09-18: 50 mg via INTRAVENOUS

## 2021-09-18 MED ORDER — PROPOFOL 500 MG/50ML IV EMUL
INTRAVENOUS | Status: DC | PRN
  Administered 2021-09-18: 100 ug/kg/min via INTRAVENOUS

## 2021-09-18 MED ORDER — PROPOFOL 500 MG/50ML IV EMUL
INTRAVENOUS | Status: AC
  Filled 2021-09-18: qty 50

## 2021-09-18 MED ORDER — SODIUM CHLORIDE 0.9 % IV SOLN: INTRAVENOUS | Status: DC

## 2021-09-18 NOTE — H&P (Signed)
Collin Darby, MD [address]  Collin Byrd  [place], [place] [address]  Main: [phone]  Fax: [phone] Pager: [phone]  Primary Care Physician:  Langston Reusing, NP Primary Gastroenterologist:  Dr. Cephas Byrd  Pre-Procedure History & Physical: HPI:  Collin Byrd is a 60 y.o. male is here for an colonoscopy.   Past Medical History:  Diagnosis Date   Alcohol abuse    Ascites    Ascites due to alcoholic cirrhosis (HCC)    Cataract    Cellulitis    CHF (congestive heart failure) ([place])    Cirrhosis (HCC)    Diabetes mellitus without complication ([place])    History of congestive heart failure 07/12/2020   History of methicillin resistant staphylococcus aureus (MRSA) 2016   Pneumonia 2021   Portal hypertension (HCC)    Pulmonary embolus (HCC)    SBP (spontaneous bacterial peritonitis) ([place]) 06/07/2020   Sepsis ([place])    Spontaneous bacterial peritonitis ([place])     Past Surgical History:  Procedure Laterality Date   CATARACT EXTRACTION     COLONOSCOPY WITH PROPOFOL N/A 01/03/2021   Procedure: COLONOSCOPY WITH PROPOFOL;  Surgeon: Lin Landsman, MD;  Location: [facility] ENDOSCOPY;  Service: Gastroenterology;  Laterality: N/A;   ESOPHAGOGASTRODUODENOSCOPY (EGD) WITH PROPOFOL N/A 01/03/2021   Procedure: ESOPHAGOGASTRODUODENOSCOPY (EGD) WITH PROPOFOL;  Surgeon: Lin Landsman, MD;  Location: [facility] ENDOSCOPY;  Service: Gastroenterology;  Laterality: N/A;   EYE SURGERY     INGUINAL HERNIA REPAIR Left 04/11/2021   Procedure: HERNIA REPAIR INGUINAL ADULT, open, WITH DIAGNOSTIC LAPAROSCOPY, AND ROBOT ASSISTED;  Surgeon: Jules Husbands, MD;  Location: [facility] ORS;  Service: General;  Laterality: Left;  provider requesting 2 hours / 120 minutes for procedure.   PARACENTESIS  2021   TESTICULAR EXPLORATION Left 04/11/2021   Procedure: TESTICULAR EXPLORATION AND REMOVAL OF LEFT TESTICLE;  Surgeon: Billey Co, MD;  Location: [facility] ORS;  Service: Urology;  Laterality:  Left;   UMBILICAL HERNIA REPAIR N/A 04/04/2021   Procedure: HERNIA REPAIR UMBILICAL ADULT, open;  Surgeon: Jules Husbands, MD;  Location: [facility] ORS;  Service: General;  Laterality: N/A;   XI ROBOTIC ASSISTED INGUINAL HERNIA REPAIR WITH MESH Left 04/04/2021   Procedure: XI ROBOTIC ASSISTED INGUINAL HERNIA REPAIR WITH MESH;  Surgeon: Jules Husbands, MD;  Location: [facility] ORS;  Service: General;  Laterality: Left;    Prior to Admission medications   Medication Sig Start Date End Date Taking? Authorizing Provider  blood glucose meter kit and supplies KIT Dispense based on patient and insurance preference. Use up to four times daily as directed. (FOR ICD-9 250.00, 250.01). 07/12/20   Iloabachie, Chioma E, NP  folic acid (FOLVITE) 1 MG tablet Take 1 tablet (1 mg total) by mouth daily. 08/08/21   Iloabachie, Chioma E, NP  furosemide (LASIX) 40 MG tablet TAKE ONE TABLET BY MOUTH EVERY DAY 12/18/20 12/18/21  Darylene Price A, FNP  Iron-FA-B Cmp-C-Biot-Probiotic (FUSION PLUS) CAPS Take 1 capsule by mouth once daily. 09/03/21   Lin Landsman, MD  omega-3 acid ethyl esters (LOVAZA) 1 g capsule TAKE 2 CAPSULES BY MOUTH IN THE MORNING AND TAKE 2 CAPSULES BY MOUTH AT BEDTIME. 07/16/21 07/16/22  Iloabachie, Chioma E, NP  pantoprazole (PROTONIX) 40 MG tablet Take 1 tablet (40 mg total) by mouth daily. 06/20/21 06/20/22  Iloabachie, Chioma E, NP  sacubitril-valsartan (ENTRESTO) 24-26 MG Take 1 tablet by mouth 2 (two) times daily. 05/10/21   Alisa Graff, FNP  spironolactone (ALDACTONE) 100 MG tablet  Take 1 tablet (100 mg total) by mouth 2 (two) times daily. 08/06/21   Alisa Graff, FNP  thiamine (VITAMIN B-1) 100 MG tablet TAKE ONE TABLET BY MOUTH EVERY DAY 09/11/21 09/11/22  Iloabachie, Chioma E, NP    Allergies as of 08/29/2021   (No Known Allergies)    History reviewed. No pertinent family history.  Social History   Socioeconomic History   Marital status: Married    Spouse name: Not on file   Number of  children: Not on file   Years of education: Not on file   Highest education level: Not on file  Occupational History   Not on file  Tobacco Use   Smoking status: Some Days    Packs/day: 0.25    Types: Cigarettes   Smokeless tobacco: Never  Vaping Use   Vaping Use: Never used  Substance and Sexual Activity   Alcohol use: Not Currently   Drug use: Never   Sexual activity: Not Currently  Other Topics Concern   Not on file  Social History Narrative   No working now; used to work housekeeping; 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 Smoking- 1-3 cig/day. Lives in [place]; with wife. No children.    Social Determinants of Health   Financial Resource Strain: Not on file  Food Insecurity: Not on file  Transportation Needs: Not on file  Physical Activity: Not on file  Stress: Not on file  Social Connections: Not on file  Intimate Partner Violence: Not on file    Review of Systems: See HPI, otherwise negative ROS  Physical Exam: BP (!) 90/55   Pulse 66   Temp (!) 96.6 F (35.9 C) (Temporal)   Resp 20   Ht 5' 7"  (1.702 m)   Wt 86.2 kg   SpO2 100%   BMI 29.76 kg/m  General:   Alert,  pleasant and cooperative in NAD Head:  Normocephalic and atraumatic. Neck:  Supple; no masses or thyromegaly. Lungs:  Clear throughout to auscultation.    Heart:  Regular rate and rhythm. Abdomen:  Soft, nontender and nondistended. Normal bowel sounds, without guarding, and without rebound.   Neurologic:  Alert and  oriented x4;  grossly normal neurologically.  Impression/Plan: Collin Byrd is here for an colonoscopy to be performed for colon cancer screening  Risks, benefits, limitations, and alternatives regarding  colonoscopy have been reviewed with the patient.  Questions have been answered.  All parties agreeable.   Sherri Sear, MD  09/18/2021, 10:54 AM

## 2021-09-18 NOTE — Op Note (Signed)
[facility] Patient Name: Collin Byrd Procedure Date: 09/18/2021 10:53 AM MRN: [id number] Account #: [id number] Date of Birth: 27-Nov-1961 Admit Type: Outpatient Age: 60 Room: [facility] ENDO ROOM 4 Gender: Male Note Status: Finalized Instrument Name: Park Meo [id number] Procedure:             Colonoscopy Indications:           Screening for colorectal malignant neoplasm Providers:             Lin Landsman MD, MD Referring MD:          Lonny Prude. Iloabachie (Referring MD) Medicines:             General Anesthesia Complications:         No immediate complications. Estimated blood loss:                         Minimal. Procedure:             Pre-Anesthesia Assessment:                        - Prior to the procedure, a History and Physical was                         performed, and patient medications and allergies were                         reviewed. The patient is competent. The risks and                         benefits of the procedure and the sedation options and                         risks were discussed with the patient. All questions                         were answered and informed consent was obtained.                         Patient identification and proposed procedure were                         verified by the physician, the nurse, the                         anesthesiologist, the anesthetist and the technician                         in the pre-procedure area in the procedure room in the                         endoscopy suite. Mental Status Examination: alert and                         oriented. Airway Examination: normal oropharyngeal                         airway and neck mobility. Respiratory Examination:  clear to auscultation. CV Examination: normal.                         Prophylactic Antibiotics: The patient does not require                         prophylactic antibiotics. Prior  Anticoagulants: The                         patient has taken no previous anticoagulant or                         antiplatelet agents. ASA Grade Assessment: III - A                         patient with severe systemic disease. After reviewing                         the risks and benefits, the patient was deemed in                         satisfactory condition to undergo the procedure. The                         anesthesia plan was to use general anesthesia.                         Immediately prior to administration of medications,                         the patient was re-assessed for adequacy to receive                         sedatives. The heart rate, respiratory rate, oxygen                         saturations, blood pressure, adequacy of pulmonary                         ventilation, and response to care were monitored                         throughout the procedure. The physical status of the                         patient was re-assessed after the procedure.                        After obtaining informed consent, the colonoscope was                         passed under direct vision. Throughout the procedure,                         the patient's blood pressure, pulse, and oxygen                         saturations were monitored continuously. The  Colonoscope was introduced through the anus and                         advanced to the the cecum, identified by appendiceal                         orifice and ileocecal valve. The colonoscopy was                         performed with moderate difficulty due to significant                         looping. Successful completion of the procedure was                         aided by applying abdominal pressure. The patient                         tolerated the procedure well. The quality of the bowel                         preparation was good. Findings:      The perianal and digital rectal examinations  were normal. Pertinent       negatives include normal sphincter tone and no palpable rectal lesions.      Five sessile polyps were found in the descending colon, ascending colon       and cecum. The polyps were 3 to 5 mm in size. These polyps were removed       with a cold snare. Resection and retrieval were complete. Estimated       blood loss was minimal.      Non-bleeding external and internal hemorrhoids were found during       retroflexion. The hemorrhoids were medium-sized.      Multiple diverticula were found in the sigmoid colon and right colon. Impression:            - Five 3 to 5 mm polyps in the descending colon, in                         the ascending colon and in the cecum, removed with a                         cold snare. Resected and retrieved.                        - Non-bleeding external and internal hemorrhoids.                        - Diverticulosis in the sigmoid colon and in the right                         colon. Recommendation:        - Discharge patient to home (with escort).                        - Resume previous diet today.                        - Continue  present medications.                        - Await pathology results.                        - Repeat colonoscopy in 3 - 5 years for surveillance                         based on pathology results.                        - Return to my office as previously scheduled. Procedure Code(s):     --- Professional ---                        [phone], Colonoscopy, flexible; with removal of                         tumor(s), polyp(s), or other lesion(s) by snare                         technique Diagnosis Code(s):     --- Professional ---                        Z12.11, Encounter for screening for malignant neoplasm                         of colon                        K63.5, Polyp of colon                        K64.8, Other hemorrhoids CPT copyright 2019 American Medical Association. All rights reserved. The  codes documented in this report are preliminary and upon coder review may  be revised to meet current compliance requirements. Dr. Ulyess Mort Lin Landsman MD, MD 09/18/2021 11:36:13 AM This report has been signed electronically. Number of Addenda: 0 Note Initiated On: 09/18/2021 10:53 AM Scope Withdrawal Time: 0 hours 23 minutes 2 seconds  Total Procedure Duration: 0 hours 29 minutes 40 seconds  Estimated Blood Loss:  Estimated blood loss: none.      [facility]

## 2021-09-18 NOTE — Transfer of Care (Signed)
Immediate Anesthesia Transfer of Care Note  Patient: Collin Byrd  Procedure(s) Performed: COLONOSCOPY WITH PROPOFOL  Patient Location: PACU and Endoscopy Unit  Anesthesia Type:General  Level of Consciousness: drowsy and patient cooperative  Airway & Oxygen Therapy: Patient Spontanous Breathing  Post-op Assessment: Report given to RN and Post -op Vital signs reviewed and stable  Post vital signs: Reviewed and stable  Last Vitals:  Vitals Value Taken Time  BP 99/58 09/18/21 1145  Temp    Pulse 63 09/18/21 1145  Resp 18 09/18/21 1145  SpO2 98 % 09/18/21 1145  Vitals shown include unvalidated device data.  Last Pain:  Vitals:   09/18/21 1143  TempSrc:   PainSc: 0-No pain         Complications: No notable events documented.

## 2021-09-18 NOTE — Anesthesia Preprocedure Evaluation (Signed)
Anesthesia Evaluation  Patient identified by MRN, date of birth, ID band Patient awake    Reviewed: Allergy & Precautions, H&P , NPO status , Patient's Chart, lab work & pertinent test results  History of Anesthesia Complications Negative for: history of anesthetic complications  Airway Mallampati: II  TM Distance: >3 FB Neck ROM: full    Dental  (+) Teeth Intact   Pulmonary neg shortness of breath, neg sleep apnea, neg COPD, Current Smoker and Patient abstained from smoking.,  H/O Pulmonary embolus    breath sounds clear to auscultation       Cardiovascular Exercise Tolerance: Good hypertension, (-) angina+CHF  (-) Past MI, (-) Cardiac Stents and (-) DOE (-) dysrhythmias  Rhythm:regular Rate:Normal  ECHO 05/2020: Normal EF. Mod LVH. Normal RV. H/o PE    Neuro/Psych negative neurological ROS  negative psych ROS   GI/Hepatic negative GI ROS, (+) Cirrhosis   ascites  substance abuse  alcohol use, No recent paracentesis. Portal hypertension   Endo/Other  diabetes  Renal/GU      Musculoskeletal   Abdominal (+) + obese,   Peds  Hematology  (+) anemia , Takes Lovenox for h/o PE, last dose yesterday   Anesthesia Other Findings Poor insight into medical history Reports he quit drinking June 2021  Past Medical History: No date: Alcohol abuse No date: Ascites No date: Cataract No date: Cellulitis No date: CHF (congestive heart failure) ([place]) No date: Cirrhosis ([place]) No date: Diabetes mellitus without complication ([place]) 0175: History of methicillin resistant staphylococcus aureus (MRSA) 2021: Pneumonia No date: Portal hypertension ([place]) No date: Pulmonary embolus ([place]) No date: Sepsis ([place]) No date: Spontaneous bacterial peritonitis ([place])  Past Surgical History: No date: CATARACT EXTRACTION 01/03/2021: COLONOSCOPY WITH PROPOFOL; N/A     Comment:  Procedure: COLONOSCOPY WITH PROPOFOL;  Surgeon: Lin Landsman, MD;  Location: [facility] ENDOSCOPY;  Service:               Gastroenterology;  Laterality: N/A; 01/03/2021: ESOPHAGOGASTRODUODENOSCOPY (EGD) WITH PROPOFOL; N/A     Comment:  Procedure: ESOPHAGOGASTRODUODENOSCOPY (EGD) WITH               PROPOFOL;  Surgeon: Lin Landsman, MD;  Location:               [facility] ENDOSCOPY;  Service: Gastroenterology;  Laterality:               N/A; No date: EYE SURGERY 2021: PARACENTESIS 1/0/2585: UMBILICAL HERNIA REPAIR; N/A     Comment:  Procedure: HERNIA REPAIR UMBILICAL ADULT, open;                Surgeon: Jules Husbands, MD;  Location: [facility] ORS;                Service: General;  Laterality: N/A; 04/04/2021: XI ROBOTIC ASSISTED INGUINAL HERNIA REPAIR WITH MESH; Left     Comment:  Procedure: XI ROBOTIC ASSISTED INGUINAL HERNIA REPAIR               WITH MESH;  Surgeon: Jules Husbands, MD;  Location: [facility]               ORS;  Service: General;  Laterality: Left;     Reproductive/Obstetrics negative OB ROS                           Anesthesia Physical  Anesthesia Plan  ASA: III  Anesthesia Plan: General   Post-op Pain Management:    Induction:   PONV Risk Score and Plan: 1 and Treatment may vary due to age or medical condition and TIVA  Airway Management Planned: Nasal Cannula and Natural Airway  Additional Equipment:   Intra-op Plan:   Post-operative Plan:   Informed Consent: I have reviewed the patients History and Physical, chart, labs and discussed the procedure including the risks, benefits and alternatives for the proposed anesthesia with the patient or authorized representative who has indicated his/her understanding and acceptance.     Dental Advisory Given  Plan Discussed with: Anesthesiologist, CRNA and Surgeon  Anesthesia Plan Comments:        Anesthesia Quick Evaluation

## 2021-09-19 ENCOUNTER — Encounter: Payer: Self-pay | Admitting: Gastroenterology

## 2021-09-19 LAB — SURGICAL PATHOLOGY

## 2021-09-20 NOTE — Anesthesia Postprocedure Evaluation (Signed)
Anesthesia Post Note  Patient: Collin Byrd  Procedure(s) Performed: COLONOSCOPY WITH PROPOFOL  Patient location during evaluation: Endoscopy Anesthesia Type: General Level of consciousness: awake and alert Pain management: pain level controlled Vital Signs Assessment: post-procedure vital signs reviewed and stable Respiratory status: spontaneous breathing, nonlabored ventilation and respiratory function stable Cardiovascular status: blood pressure returned to baseline and stable Postop Assessment: no apparent nausea or vomiting Anesthetic complications: no   No notable events documented.   Last Vitals:  Vitals:   09/18/21 1150 09/18/21 1200  BP: (!) 92/54 102/76  Pulse: 80 64  Resp: 15 14  Temp:    SpO2: 100% 100%    Last Pain:  Vitals:   09/18/21 1210  TempSrc:   PainSc: 0-No pain                 [place] Ouch

## 2021-10-02 ENCOUNTER — Other Ambulatory Visit: Payer: Self-pay

## 2021-10-03 ENCOUNTER — Other Ambulatory Visit: Payer: Self-pay

## 2021-10-09 ENCOUNTER — Other Ambulatory Visit: Payer: Self-pay

## 2021-10-10 ENCOUNTER — Other Ambulatory Visit: Payer: Self-pay

## 2021-10-14 ENCOUNTER — Other Ambulatory Visit: Payer: Self-pay

## 2021-10-14 ENCOUNTER — Other Ambulatory Visit: Payer: Self-pay | Admitting: Family

## 2021-10-14 DIAGNOSIS — Z8679 Personal history of other diseases of the circulatory system: Secondary | ICD-10-CM

## 2021-10-14 MED ORDER — FUROSEMIDE 40 MG PO TABS: ORAL_TABLET | Freq: Every day | ORAL | 5 refills | Status: DC

## 2021-10-14 MED ORDER — ENTRESTO 24-26 MG PO TABS: 1.0000 | ORAL_TABLET | Freq: Two times a day (BID) | ORAL | 5 refills | Status: DC

## 2021-10-14 MED ORDER — SPIRONOLACTONE 100 MG PO TABS: 100.0000 mg | ORAL_TABLET | Freq: Two times a day (BID) | ORAL | 5 refills | Status: DC

## 2021-10-14 NOTE — Progress Notes (Signed)
Sent RX for entresto, furosemide & spironolactone to [facility] in [place]

## 2021-10-15 ENCOUNTER — Other Ambulatory Visit: Payer: Self-pay | Admitting: Gerontology

## 2021-10-15 DIAGNOSIS — F1011 Alcohol abuse, in remission: Secondary | ICD-10-CM

## 2021-10-15 DIAGNOSIS — K219 Gastro-esophageal reflux disease without esophagitis: Secondary | ICD-10-CM

## 2021-10-15 MED ORDER — PANTOPRAZOLE SODIUM 40 MG PO TBEC: 40.0000 mg | DELAYED_RELEASE_TABLET | Freq: Every day | ORAL | 1 refills | Status: DC

## 2021-10-15 MED ORDER — FOLIC ACID 1 MG PO TABS: 1.0000 mg | ORAL_TABLET | Freq: Every day | ORAL | 2 refills | Status: DC

## 2021-10-15 MED ORDER — OMEGA-3-ACID ETHYL ESTERS 1 G PO CAPS: ORAL_CAPSULE | ORAL | 1 refills | Status: DC

## 2021-10-15 MED ORDER — THIAMINE MONONITRATE 100 MG PO TABS: ORAL_TABLET | Freq: Every day | ORAL | 3 refills | Status: DC

## 2021-10-16 ENCOUNTER — Encounter: Payer: Self-pay | Admitting: Gerontology

## 2021-10-16 ENCOUNTER — Ambulatory Visit: Payer: Medicaid Other | Admitting: Gerontology

## 2021-10-16 ENCOUNTER — Other Ambulatory Visit: Payer: Self-pay

## 2021-10-16 VITALS — BP 102/67 | HR 91 | Temp 97.9°F | Resp 16 | Ht 67.0 in | Wt 201.1 lb

## 2021-10-16 DIAGNOSIS — Z09 Encounter for follow-up examination after completed treatment for conditions other than malignant neoplasm: Secondary | ICD-10-CM

## 2021-10-16 NOTE — Progress Notes (Signed)
Established Patient Office Visit  Subjective:  Patient ID: Collin Byrd, male    DOB: [birth year]  Age: 60 y.o. 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 He states that he's compliant with his care and continues to make healthy lifestyle changes. He had Colonoscopy done on 09/18/21 by Dr Marius Ditch , Five 3 to 5 mm polyps in the descending colon, in the ascending colon and in the cecum, removed with a cold snare. Resected and retrieved. - Non-bleeding external and internal hemorrhoids. - Diverticulosis in the sigmoid colon and in the right colon.  He denies chest pain, palpitation, and lightheadedness.  Overall he states that he is doing well and offers no further complaint.  Past Medical History:  Diagnosis Date   Alcohol abuse    Ascites    Ascites due to alcoholic cirrhosis (HCC)    Cataract    Cellulitis    CHF (congestive heart failure) ([place])    Cirrhosis (HCC)    Diabetes mellitus without complication ([place])    History of congestive heart failure 07/12/2020   History of methicillin resistant staphylococcus aureus (MRSA) 2016   Pneumonia 2021   Portal hypertension (HCC)    Pulmonary embolus (HCC)    SBP (spontaneous bacterial peritonitis) ([place]) 06/07/2020   Sepsis ([place])    Spontaneous bacterial peritonitis ([place])     Past Surgical History:  Procedure Laterality Date   CATARACT EXTRACTION     COLONOSCOPY WITH PROPOFOL N/A 01/03/2021   Procedure: COLONOSCOPY WITH PROPOFOL;  Surgeon: Lin Landsman, MD;  Location: [facility] ENDOSCOPY;  Service: Gastroenterology;  Laterality: N/A;   COLONOSCOPY WITH PROPOFOL N/A 09/18/2021   Procedure: COLONOSCOPY WITH PROPOFOL;  Surgeon: Lin Landsman, MD;  Location: [facility]  ENDOSCOPY;  Service: Gastroenterology;  Laterality: N/A;   ESOPHAGOGASTRODUODENOSCOPY (EGD) WITH PROPOFOL N/A 01/03/2021   Procedure: ESOPHAGOGASTRODUODENOSCOPY (EGD) WITH PROPOFOL;  Surgeon: Lin Landsman, MD;  Location: [facility] ENDOSCOPY;  Service: Gastroenterology;  Laterality: N/A;   EYE SURGERY     INGUINAL HERNIA REPAIR Left 04/11/2021   Procedure: HERNIA REPAIR INGUINAL ADULT, open, WITH DIAGNOSTIC LAPAROSCOPY, AND ROBOT ASSISTED;  Surgeon: Jules Husbands, MD;  Location: [facility] ORS;  Service: General;  Laterality: Left;  provider requesting 2 hours / 120 minutes for procedure.   PARACENTESIS  2021   TESTICULAR EXPLORATION Left 04/11/2021   Procedure: TESTICULAR EXPLORATION AND REMOVAL OF LEFT TESTICLE;  Surgeon: Billey Co, MD;  Location: [facility] ORS;  Service: Urology;  Laterality: Left;   UMBILICAL HERNIA REPAIR N/A 04/04/2021   Procedure: HERNIA REPAIR UMBILICAL ADULT, open;  Surgeon: Jules Husbands, MD;  Location: [facility] ORS;  Service: General;  Laterality: N/A;   XI ROBOTIC ASSISTED INGUINAL HERNIA REPAIR WITH MESH Left 04/04/2021   Procedure: XI ROBOTIC ASSISTED INGUINAL HERNIA REPAIR WITH MESH;  Surgeon: Jules Husbands, MD;  Location: [facility] ORS;  Service: General;  Laterality: Left;    History reviewed. No pertinent family history.  Social History   Socioeconomic History   Marital status: Married    Spouse name: Not on file   Number of children: Not on file   Years of education: Not on file   Highest education level:  Not on file  Occupational History   Not on file  Tobacco Use   Smoking status: Every Day    Packs/day: 0.25    Types: Cigarettes   Smokeless tobacco: Never  Vaping Use   Vaping Use: Never used  Substance and Sexual Activity   Alcohol use: Not Currently   Drug use: Never   Sexual activity: Not Currently  Other Topics Concern   Not on file  Social History Narrative   No working now; used to work housekeeping; 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 Smoking- 1-3  cig/day. Lives in [place]; with wife. No children.    Social Determinants of Health   Financial Resource Strain: Not on file  Food Insecurity: Not on file  Transportation Needs: Not on file  Physical Activity: Not on file  Stress: Not on file  Social Connections: Not on file  Intimate Partner Violence: Not on file    Outpatient Medications Prior to Visit  Medication Sig Dispense Refill   blood glucose meter kit and supplies KIT Dispense based on patient and insurance preference. Use up to four times daily as directed. (FOR ICD-9 250.00, 250.01). 1 each 0   folic acid (FOLVITE) 1 MG tablet Take 1 tablet (1 mg total) by mouth daily. 30 tablet 2   furosemide (LASIX) 40 MG tablet TAKE ONE TABLET BY MOUTH EVERY DAY 30 tablet 5   omega-3 acid ethyl esters (LOVAZA) 1 g capsule TAKE 2 CAPSULES BY MOUTH IN THE MORNING AND TAKE 2 CAPSULES BY MOUTH AT BEDTIME. 120 capsule 1   pantoprazole (PROTONIX) 40 MG tablet Take 1 tablet (40 mg total) by mouth daily. 90 tablet 1   sacubitril-valsartan (ENTRESTO) 24-26 MG Take 1 tablet by mouth 2 (two) times daily. 60 tablet 5   spironolactone (ALDACTONE) 100 MG tablet Take 1 tablet (100 mg total) by mouth 2 (two) times daily. 60 tablet 5   thiamine (VITAMIN B-1) 100 MG tablet TAKE ONE TABLET BY MOUTH EVERY DAY 30 tablet 3   Iron-FA-B Cmp-C-Biot-Probiotic (FUSION PLUS) CAPS Take 1 capsule by mouth once daily. (Patient not taking: Reported on 10/16/2021) 30 capsule 0   No facility-administered medications prior to visit.    No Known Allergies  ROS Review of Systems  Constitutional: Negative.   Eyes: Negative.   Respiratory: Negative.    Cardiovascular: Negative.   Gastrointestinal: Negative.   Endocrine: Negative.   Skin: Negative.   Neurological: Negative.   Hematological: Negative.   Psychiatric/Behavioral: Negative.       Objective:    Physical Exam HENT:     Head: Normocephalic and atraumatic.     Mouth/Throat:     Mouth: Mucous  membranes are moist.  Cardiovascular:     Rate and Rhythm: Normal rate and regular rhythm.     Pulses: Normal pulses.     Heart sounds: Normal heart sounds.  Pulmonary:     Effort: Pulmonary effort is normal.     Breath sounds: Normal breath sounds.  Skin:    General: Skin is warm.  Neurological:     General: No focal deficit present.     Mental Status: He is alert and oriented to person, place, and time. Mental status is at baseline.  Psychiatric:        Mood and Affect: Mood normal.        Behavior: Behavior normal.        Thought Content: Thought content normal.        Judgment: Judgment normal.  BP 102/67 (BP Location: Left Arm, Patient Position: Sitting, Cuff Size: Large)   Pulse 91   Temp 97.9 F (36.6 C)   Resp 16   Ht _0  (1.702 m)   Wt 201 lb 1.6 oz (91.2 kg)   SpO2 98%   BMI 31.50 kg/m  Wt Readings from Last 3 Encounters:  10/16/21 201 lb 1.6 oz (91.2 kg)  09/18/21 190 lb (86.2 kg)  07/29/21 201 lb 3.2 oz (91.3 kg)   Weight loss encouraged.  Health Maintenance Due  Topic Date Due   COVID-19 Vaccine (1) Never done   Pneumococcal Vaccine 70-79 Years old (1 - PCV) Never done   TETANUS/TDAP  Never done   Zoster Vaccines- Shingrix (1 of 2) Never done   INFLUENZA VACCINE  Never done   HEMOGLOBIN A1C  08/13/2021    There are no preventive care reminders to display for this patient.  Lab Results  Component Value Date   TSH 0.773 06/05/2020   Lab Results  Component Value Date   WBC 9.3 08/29/2021   HGB 11.8 (L) 08/29/2021   HCT 34.5 (L) 08/29/2021   MCV 89 08/29/2021   PLT 230 08/29/2021   Lab Results  Component Value Date   NA 137 08/29/2021   K 4.9 08/29/2021   CO2 19 (L) 08/29/2021   GLUCOSE 89 08/29/2021   BUN 30 (H) 08/29/2021   CREATININE 1.03 08/29/2021   BILITOT 0.9 08/29/2021   ALKPHOS 72 08/29/2021   AST 15 08/29/2021   ALT 16 08/29/2021   PROT 7.4 08/29/2021   ALBUMIN 4.2 08/29/2021   CALCIUM 9.4 08/29/2021   ANIONGAP 12  04/30/2021   EGFR 84 08/29/2021   Lab Results  Component Value Date   CHOL 192 02/13/2021   Lab Results  Component Value Date   HDL 67 02/13/2021   Lab Results  Component Value Date   LDLCALC 114 (H) 02/13/2021   Lab Results  Component Value Date   TRIG 61 02/13/2021   Lab Results  Component Value Date   CHOLHDL 2.9 02/13/2021   Lab Results  Component Value Date   HGBA1C 5.4 02/13/2021      Assessment & Plan:     1. Encounter for follow-up -He will continue on current medication regimen and will follow-up with new primary care provider on Monday.  He was advised to go to the emergency room for worsening symptoms.    Follow-up: He has no follow-up appointments because his Medicaid is active.  [facility] wishes him well.   Aleksis Jiggetts Jerold Coombe, NP

## 2021-10-20 DIAGNOSIS — Z720 Tobacco use: Secondary | ICD-10-CM | POA: Insufficient documentation

## 2021-10-20 DIAGNOSIS — F1011 Alcohol abuse, in remission: Secondary | ICD-10-CM | POA: Insufficient documentation

## 2021-10-20 NOTE — Progress Notes (Signed)
BP 105/68   Pulse 96   Temp 98.3 F (36.8 C) (Oral)   Ht _0  (1.702 m)   Wt 200 lb (90.7 kg)   SpO2 97%   BMI 31.32 kg/m    Subjective:    Patient ID: Collin Byrd, male    DOB: 1961/04/09, 60 y.o.   MRN: [id number]  HPI: Collin Byrd is a 60 y.o. male  Chief Complaint  Patient presents with   New Patient (Initial Visit)   Patient presents to clinic to establish care with new PCP.  He was previously seen at [facility].  He has transferred to our clinic now that he has insurance.  Patient reports a history of Cirrhosis of the liver (followed by GI), history of alcohol abuse (no longer drinking alcohol.  Stopped in June), hypertension, DM2, bilateral pulmonary embolis in June 2021 (completed therapy with hematologist), diastolic HF.  Patient currently smoked 4-5 cigarettes per day.  Patient denies a history of: Elevated Cholesterol,Thyroid problems, Depression, Anxiety, Neurological problems, and Abdominal problems.     Denies HA, CP, SOB, dizziness, palpitations, visual changes, and lower extremity swelling.  Active Ambulatory Problems    Diagnosis Date Noted   Hepatic cirrhosis ([place]) 06/07/2020   Type 2 diabetes mellitus with hyperglycemia, without long-term current use of insulin (HCC)    Traumatic dislocation of finger 09/28/2018   Screening for colon cancer    S/P inguinal hernia repair 04/11/2021   Incarcerated inguinal hernia, unilateral    Bilateral pulmonary embolism ([place]) 04/30/2021   Pterygium of right eye 04/03/2021   Tobacco abuse 10/20/2021   Chronic diastolic heart failure ([place]) 10/21/2021   History of alcohol abuse 10/21/2021   Resolved Ambulatory Problems    Diagnosis Date Noted   Acute pulmonary embolus [facility])    Past Medical History:  Diagnosis Date   Alcohol abuse    Ascites    Ascites due to alcoholic cirrhosis (HCC)    CHF (congestive heart failure) ([place])    Cirrhosis ([place])    Diabetes mellitus without complication ([place])     History of congestive heart failure 07/12/2020   History of methicillin resistant staphylococcus aureus (MRSA) 2016   Pneumonia 2021   Portal hypertension (HCC)    Pulmonary embolus (HCC)    SBP (spontaneous bacterial peritonitis) ([place]) 06/07/2020   Spontaneous bacterial peritonitis ([place])    Past Surgical History:  Procedure Laterality Date   CATARACT EXTRACTION     COLONOSCOPY WITH PROPOFOL N/A 01/03/2021   Procedure: COLONOSCOPY WITH PROPOFOL;  Surgeon: Lin Landsman, MD;  Location: [facility] ENDOSCOPY;  Service: Gastroenterology;  Laterality: N/A;   COLONOSCOPY WITH PROPOFOL N/A 09/18/2021   Procedure: COLONOSCOPY WITH PROPOFOL;  Surgeon: Lin Landsman, MD;  Location: [facility] ENDOSCOPY;  Service: Gastroenterology;  Laterality: N/A;   ESOPHAGOGASTRODUODENOSCOPY (EGD) WITH PROPOFOL N/A 01/03/2021   Procedure: ESOPHAGOGASTRODUODENOSCOPY (EGD) WITH PROPOFOL;  Surgeon: Lin Landsman, MD;  Location: [facility] ENDOSCOPY;  Service: Gastroenterology;  Laterality: N/A;   EYE SURGERY     INGUINAL HERNIA REPAIR Left 04/11/2021   Procedure: HERNIA REPAIR INGUINAL ADULT, open, WITH DIAGNOSTIC LAPAROSCOPY, AND ROBOT ASSISTED;  Surgeon: Jules Husbands, MD;  Location: [facility] ORS;  Service: General;  Laterality: Left;  provider requesting 2 hours / 120 minutes for procedure.   PARACENTESIS  2021   TESTICULAR EXPLORATION Left 04/11/2021   Procedure: TESTICULAR EXPLORATION AND REMOVAL OF LEFT TESTICLE;  Surgeon: Billey Co, MD;  Location: [facility] ORS;  Service: Urology;  Laterality: Left;  UMBILICAL HERNIA REPAIR N/A 04/04/2021   Procedure: HERNIA REPAIR UMBILICAL ADULT, open;  Surgeon: Jules Husbands, MD;  Location: [facility] ORS;  Service: General;  Laterality: N/A;   XI ROBOTIC ASSISTED INGUINAL HERNIA REPAIR WITH MESH Left 04/04/2021   Procedure: XI ROBOTIC ASSISTED INGUINAL HERNIA REPAIR WITH MESH;  Surgeon: Jules Husbands, MD;  Location: [facility] ORS;  Service: General;  Laterality: Left;   History reviewed. No  pertinent family history.   Review of Systems  Eyes:  Negative for visual disturbance.  Respiratory:  Negative for shortness of breath.   Cardiovascular:  Negative for chest pain and leg swelling.  Neurological:  Negative for light-headedness and headaches.   Per HPI unless specifically indicated above     Objective:    BP 105/68   Pulse 96   Temp 98.3 F (36.8 C) (Oral)   Ht _0  (1.702 m)   Wt 200 lb (90.7 kg)   SpO2 97%   BMI 31.32 kg/m   Wt Readings from Last 3 Encounters:  10/21/21 200 lb (90.7 kg)  10/16/21 201 lb 1.6 oz (91.2 kg)  09/18/21 190 lb (86.2 kg)    Physical Exam Vitals and nursing note reviewed.  Constitutional:      General: He is not in acute distress.    Appearance: Normal appearance. He is not ill-appearing, toxic-appearing or diaphoretic.  HENT:     Head: Normocephalic.     Right Ear: External ear normal.     Left Ear: External ear normal.     Nose: Nose normal. No congestion or rhinorrhea.     Mouth/Throat:     Mouth: Mucous membranes are moist.  Eyes:     General:        Right eye: No discharge.        Left eye: No discharge.     Extraocular Movements: Extraocular movements intact.     Conjunctiva/sclera: Conjunctivae normal.     Pupils: Pupils are equal, round, and reactive to light.  Cardiovascular:     Rate and Rhythm: Normal rate and regular rhythm.     Heart sounds: No murmur heard. Pulmonary:     Effort: Pulmonary effort is normal. No respiratory distress.     Breath sounds: Wheezing present. No rhonchi or rales.  Abdominal:     General: Abdomen is flat. Bowel sounds are normal.  Musculoskeletal:     Cervical back: Normal range of motion and neck supple.  Skin:    General: Skin is warm and dry.     Capillary Refill: Capillary refill takes less than 2 seconds.  Neurological:     General: No focal deficit present.     Mental Status: He is alert and oriented to person, place, and time.  Psychiatric:        Mood and Affect:  Mood normal.        Behavior: Behavior normal.        Thought Content: Thought content normal.        Judgment: Judgment normal.    Results for orders placed or performed during the hospital encounter of 09/18/21  Glucose, capillary  Result Value Ref Range   Glucose-Capillary 112 (H) 70 - 99 mg/dL  Surgical pathology  Result Value Ref Range   SURGICAL PATHOLOGY      SURGICAL PATHOLOGY CASE: [id number] PATIENT: Collin Byrd Surgical Pathology Report     Specimen Submitted: A. Colon polyp x2, cecum; cold snare B. Colon polyp, ascending; cold snare C. Colon polyp x2,  descending; cold snare  Clinical History: Screening for colon cancer Z12.11.  Colon polyps, hemorrhoids, diverticulosis    DIAGNOSIS: A. COLON POLYPS X2, CECUM; COLD SNARE: - MULTIPLE FRAGMENTS OF TUBULAR ADENOMAS. - NEGATIVE FOR HIGH-GRADE DYSPLASIA AND MALIGNANCY.  B. COLON POLYP, ASCENDING; COLD SNARE: - TUBULAR ADENOMA. - NEGATIVE FOR HIGH-GRADE DYSPLASIA AND MALIGNANCY.  C. COLON POLYPS X2, DESCENDING; COLD SNARE: - TUBULAR ADENOMA. - NEGATIVE FOR HIGH-GRADE DYSPLASIA AND MALIGNANCY.  GROSS DESCRIPTION: A. Labeled: Cecum colon polyp cold snare x2 Received: Formalin Collection time: 11:19 AM on 09/18/2021 Placed into formalin time: 11:19 AM on 09/18/2021 Tissue fragment(s): Multiple Size: Aggregate, 1.7 x 0.6 x 0.5 cm Description: Received a re 2 fragments of tan soft tissue admixed with intestinal debris.  The ratio of soft tissue to intestinal debris is 90: 10.  The larger soft tissue fragment has a resection margin which is inked blue.  This fragment is trisected. Entirely submitted in cassettes 1-2 with a trisected fragment in cassette 1 and the remaining fragments in cassette 2.  B. Labeled: Ascending colon polyp cold snare Received: Formalin Collection time: 11:24 AM on 09/18/2021 Placed into formalin time: 11:24 AM on 09/18/2021 Tissue fragment(s): Multiple Size: Aggregate, 3 x  0.8 x 0.2 cm Description: Received is at least 1 fragment of tan elongated soft tissue admixed with intestinal debris.  The ratio of soft tissue to intestinal debris is 40: 60. Entirely submitted in 1 cassette.  C. Labeled: Descending colon polyp cold snare x2 Received: Formalin Collection time: 11:26 AM on 09/18/2021 Placed into formalin time: 11:26 AM on 09/18/2021 Tissue fragment(s): Multiple Size: Aggregate, 1.4 x 0.5 x  0.2 cm Description: Received are at least 3 fragments of tan soft tissue admixed with intestinal debris.  The ratio of soft tissue to intestinal debris is 80: 20. Entirely submitted in 1 cassette.  RB 09/18/2021   Final Diagnosis performed by Allena Napoleon, MD.   Electronically signed 09/19/2021 9:29:36AM The electronic signature indicates that the named Attending Pathologist has evaluated the specimen Technical component performed at [facility], [address], [place], [place] [address] Lab: [phone] Dir: Rush Farmer, MD, MMM  Professional component performed at [facility], [facility], [place], [place], [place] [address] Lab: [phone] Dir: Dellia Nims. Reuel Derby, MD       Assessment & Plan:   Problem List Items Addressed This Visit       Cardiovascular and Mediastinum   Bilateral pulmonary embolism ([place])    Completed therapy. Told to follow up PRN with hematologist.  Not a candidate for long term anticoagulation due to liver disease.        Relevant Medications   omega-3 acid ethyl esters (LOVAZA) 1 g capsule   Other Relevant Orders   Comp Met (CMET)   HgB A1c   Lipid Profile   CBC w/Diff   Chronic diastolic heart failure ([place])    Followed by HF Clinic.  Patient has upcoming appointment in November. Continue per their recommendations.       Relevant Medications   omega-3 acid ethyl esters (LOVAZA) 1 g capsule     Digestive   Hepatic cirrhosis ([place]) - Primary    Chronic. Patient is followed by GI. Per patient, he is  abstaining from alcohol. Ascites remains controlled at this time.  Continue with supplements.      Relevant Orders   Comp Met (CMET)   HgB A1c   Lipid Profile     Endocrine   Type 2 diabetes mellitus with hyperglycemia,  without long-term current use of insulin (HCC)    Chronic.  Labs ordered today.  Not currently on any diabetic medications.  Will make recommendations based on lab results.       Relevant Orders   Comp Met (CMET)   HgB A1c   Lipid Profile     Other   Tobacco abuse    Chronic. Current everyday smoker.  Recommend total smoking cessation.  Pneumonia vaccine given during visit today.       History of alcohol abuse    Chronic. Patient has not had any alcohol since his hospitalization in June 2022.  Recommend continuing to abstain from alcohol.      Other Visit Diagnoses     Encounter to establish care       Need for prophylactic vaccination against Streptococcus pneumoniae (pneumococcus)       Relevant Orders   Pneumococcal polysaccharide vaccine 23-valent greater than or equal to 2yo subcutaneous/IM (Completed)        Follow up plan: Return in about 3 months (around 01/21/2022) for Physical and Fasting labs.

## 2021-10-21 ENCOUNTER — Ambulatory Visit (INDEPENDENT_AMBULATORY_CARE_PROVIDER_SITE_OTHER): Payer: Medicaid Other | Admitting: Nurse Practitioner

## 2021-10-21 ENCOUNTER — Encounter: Payer: Self-pay | Admitting: Nurse Practitioner

## 2021-10-21 ENCOUNTER — Other Ambulatory Visit: Payer: Self-pay | Admitting: Gerontology

## 2021-10-21 ENCOUNTER — Other Ambulatory Visit: Payer: Self-pay

## 2021-10-21 VITALS — BP 105/68 | HR 96 | Temp 98.3°F | Ht 67.0 in | Wt 200.0 lb

## 2021-10-21 DIAGNOSIS — K7030 Alcoholic cirrhosis of liver without ascites: Secondary | ICD-10-CM

## 2021-10-21 DIAGNOSIS — F1011 Alcohol abuse, in remission: Secondary | ICD-10-CM

## 2021-10-21 DIAGNOSIS — Z23 Encounter for immunization: Secondary | ICD-10-CM

## 2021-10-21 DIAGNOSIS — I5032 Chronic diastolic (congestive) heart failure: Secondary | ICD-10-CM | POA: Diagnosis not present

## 2021-10-21 DIAGNOSIS — I2699 Other pulmonary embolism without acute cor pulmonale: Secondary | ICD-10-CM

## 2021-10-21 DIAGNOSIS — E1165 Type 2 diabetes mellitus with hyperglycemia: Secondary | ICD-10-CM | POA: Diagnosis not present

## 2021-10-21 DIAGNOSIS — Z7689 Persons encountering health services in other specified circumstances: Secondary | ICD-10-CM

## 2021-10-21 DIAGNOSIS — Z720 Tobacco use: Secondary | ICD-10-CM

## 2021-10-21 MED ORDER — THIAMINE MONONITRATE 100 MG PO TABS: ORAL_TABLET | Freq: Every day | ORAL | 1 refills | Status: DC

## 2021-10-21 MED ORDER — OMEGA-3-ACID ETHYL ESTERS 1 G PO CAPS: ORAL_CAPSULE | ORAL | 1 refills | Status: DC

## 2021-10-21 NOTE — Assessment & Plan Note (Signed)
Followed by HF Clinic.  Patient has upcoming appointment in November. Continue per their recommendations.

## 2021-10-21 NOTE — Assessment & Plan Note (Signed)
Chronic. Patient has not had any alcohol since his hospitalization in June 2022.  Recommend continuing to abstain from alcohol.

## 2021-10-21 NOTE — Assessment & Plan Note (Signed)
Chronic. Current everyday smoker.  Recommend total smoking cessation.  Pneumonia vaccine given during visit today.

## 2021-10-21 NOTE — Assessment & Plan Note (Signed)
Completed therapy. Told to follow up PRN with hematologist.  Not a candidate for long term anticoagulation due to liver disease.

## 2021-10-21 NOTE — Assessment & Plan Note (Signed)
Chronic.  Labs ordered today.  Not currently on any diabetic medications.  Will make recommendations based on lab results.

## 2021-10-21 NOTE — Assessment & Plan Note (Signed)
Chronic. Patient is followed by GI. Per patient, he is abstaining from alcohol. Ascites remains controlled at this time.  Continue with supplements.

## 2021-10-22 ENCOUNTER — Telehealth: Payer: Self-pay | Admitting: Nurse Practitioner

## 2021-10-22 LAB — CBC WITH DIFFERENTIAL/PLATELET
Basophils Absolute: 0 10*3/uL (ref 0.0–0.2)
Basos: 1 %
EOS (ABSOLUTE): 0.1 10*3/uL (ref 0.0–0.4)
Eos: 1 %
Hematocrit: 36.7 % — ABNORMAL LOW (ref 37.5–51.0)
Hemoglobin: 12.3 g/dL — ABNORMAL LOW (ref 13.0–17.7)
Immature Grans (Abs): 0.1 10*3/uL (ref 0.0–0.1)
Immature Granulocytes: 1 %
Lymphocytes Absolute: 0.8 10*3/uL (ref 0.7–3.1)
Lymphs: 10 %
MCH: 30.9 pg (ref 26.6–33.0)
MCHC: 33.5 g/dL (ref 31.5–35.7)
MCV: 92 fL (ref 79–97)
Monocytes Absolute: 0.8 10*3/uL (ref 0.1–0.9)
Monocytes: 9 %
Neutrophils Absolute: 6.5 10*3/uL (ref 1.4–7.0)
Neutrophils: 78 %
Platelets: 249 10*3/uL (ref 150–450)
RBC: 3.98 x10E6/uL — ABNORMAL LOW (ref 4.14–5.80)
RDW: 13.8 % (ref 11.6–15.4)
WBC: 8.3 10*3/uL (ref 3.4–10.8)

## 2021-10-22 LAB — LIPID PANEL
Chol/HDL Ratio: 3.6 ratio (ref 0.0–5.0)
Cholesterol, Total: 170 mg/dL (ref 100–199)
HDL: 47 mg/dL (ref >39)
LDL Chol Calc (NIH): 93 mg/dL (ref 0–99)
Triglycerides: 172 mg/dL — ABNORMAL HIGH (ref 0–149)
VLDL Cholesterol Cal: 30 mg/dL (ref 5–40)

## 2021-10-22 LAB — HEMOGLOBIN A1C
Est. average glucose Bld gHb Est-mCnc: 126 mg/dL
Hgb A1c MFr Bld: 6 % — ABNORMAL HIGH (ref 4.8–5.6)

## 2021-10-22 LAB — COMPREHENSIVE METABOLIC PANEL
ALT: 19 IU/L (ref 0–44)
AST: 18 IU/L (ref 0–40)
Albumin/Globulin Ratio: 1.4 (ref 1.2–2.2)
Albumin: 4.5 g/dL (ref 3.8–4.9)
Alkaline Phosphatase: 74 IU/L (ref 44–121)
BUN/Creatinine Ratio: 19 (ref 9–20)
BUN: 19 mg/dL (ref 6–24)
Bilirubin Total: 0.6 mg/dL (ref 0.0–1.2)
CO2: 24 mmol/L (ref 20–29)
Calcium: 9.4 mg/dL (ref 8.7–10.2)
Chloride: 101 mmol/L (ref 96–106)
Creatinine, Ser: 1 mg/dL (ref 0.76–1.27)
Globulin, Total: 3.3 g/dL (ref 1.5–4.5)
Glucose: 101 mg/dL — ABNORMAL HIGH (ref 70–99)
Potassium: 4.6 mmol/L (ref 3.5–5.2)
Sodium: 138 mmol/L (ref 134–144)
Total Protein: 7.8 g/dL (ref 6.0–8.5)
eGFR: 87 mL/min/{1.73_m2} (ref >59)

## 2021-10-22 MED ORDER — OMEGA-3-ACID ETHYL ESTERS 1 G PO CAPS: ORAL_CAPSULE | ORAL | 1 refills | Status: DC

## 2021-10-22 NOTE — Telephone Encounter (Signed)
Copied from [place] [id number]. Topic: Quick Communication - Rx Refill/Question >> Oct 21, 2021  4:21 PM Loma Boston wrote: omega-3 acid ethyl esters (LOVAZA) 1 g capsule 180 capsule 1 10/21/2021 10/21/2022  Sig - Route: TAKE 2 CAPSULES BY MOUTH IN THE MORNING AND TAKE 2 CAPSULES BY MOUTH AT BEDTIME. - Oral  Sent to pharmacy as: omega-3 acid ethyl esters (LOVAZA) 1 g capsule  E-Prescribing Status: Receipt confirmed by pharmacy (10/21/2021 10:03 AM EDT)   Pharmacy states they did not receive today, insurance co has reached out on behalf of the pt. Ins company states that pharmacy request them to reach out on behalf of pt as they never received. [place], [address] [place] [address] Phone: [phone] Fax: [phone]

## 2021-10-22 NOTE — Telephone Encounter (Signed)
Spoke with pharmacy and refill sent.  Patient aware.

## 2021-10-22 NOTE — Progress Notes (Signed)
Please let patient know that overall his lab work looks good. A1c remains well controlled at 6.0.  Patient does have some anemia.  It has improved from his last lab work.  We will continue to monitor this in the future. Please let me know if he has any questions.

## 2021-10-28 ENCOUNTER — Telehealth: Payer: Self-pay | Admitting: Nurse Practitioner

## 2021-10-28 NOTE — Telephone Encounter (Signed)
Copied from [place] [id number]. Topic: General - Other >> Oct 28, 2021 12:01 PM Tessa Lerner A wrote: Reason for CRM: The patient has called to share that they're currently unable to pick up their prescription for pantoprazole (PROTONIX) 40 MG tablet [[id number]]   The patient would like to be prescribed a more cost effective alternative if possible   Please contact further

## 2021-10-29 MED ORDER — OMEPRAZOLE 20 MG PO CPDR: 20.0000 mg | DELAYED_RELEASE_CAPSULE | Freq: Every day | ORAL | 3 refills | Status: DC

## 2021-10-29 NOTE — Telephone Encounter (Signed)
Routing to provider. Can we send in a different medication for the patient?

## 2021-10-29 NOTE — Telephone Encounter (Signed)
Omeprazole sent to the pharmacy for patient.

## 2021-11-01 ENCOUNTER — Telehealth: Payer: Self-pay | Admitting: Pharmacy Technician

## 2021-11-01 NOTE — Telephone Encounter (Signed)
Patient has Medicaid with prescription drug coverage.  No longer meets [facility]'s eligibility criteria.  Patient notified.  [place], [place]  [address]  November 01, 2021    Richard Miu [address] [place], [place]  [address]  Dear Marland Kitchen:  This is to inform you that you are no longer eligible to receive medication assistance at [facility].  The reason(s) are:    _____Your total gross monthly household income exceeds 250% of the Federal Poverty Level.   _____Tangible assets (savings, checking, stocks/bonds, pension, retirement, etc.) exceeds our limit  __X__You are eligible to receive benefits from [facility], [facility] or HIV Medication            Assistance program _____You are eligible to receive benefits from a Medicare Part "D" plan __X__You have prescription insurance Medicaid _____You are not an [facility] resident _____Failure to provide all requested documentation (proof of income information for 2022., and/or Patient Intake Application, [place], Contract, etc).    We regret that we are unable to help you at this time.  If your prescription coverage is terminated, please contact [facility], so that we may reassess your eligibility for our program.  If you have questions, we may be contacted at [phone].  Thank you,  [facility]

## 2021-11-06 ENCOUNTER — Other Ambulatory Visit: Payer: Medicaid Other

## 2021-11-06 ENCOUNTER — Other Ambulatory Visit: Payer: Self-pay | Admitting: Nurse Practitioner

## 2021-11-06 DIAGNOSIS — K219 Gastro-esophageal reflux disease without esophagitis: Secondary | ICD-10-CM

## 2021-11-06 DIAGNOSIS — F1011 Alcohol abuse, in remission: Secondary | ICD-10-CM

## 2021-11-06 NOTE — Telephone Encounter (Signed)
Requested medications are due for refill today.  Unknown  Requested medications are on the active medications list.  Folic acid - yes, Protonix  - no  Last refill. 10/15/2021 for both  Future visit scheduled.   yes  Notes to clinic.  Medications were prescribed by Chioma Iloabachie.  Jon Billings d/c'd Protonix 10/29/2021.

## 2021-11-06 NOTE — Telephone Encounter (Signed)
Copied from [place] [id number]. Topic: General - Other >> Nov 06, 2021 11:57 AM Yvette Rack wrote: Reason for CRM: Dawn with [facility] stated a PA is needs for thiamine (VITAMIN B-1) 100 MG tablet and omega-3 acid ethyl esters (LOVAZA) 1 g capsule. Cb# [phone] Fax# [phone]

## 2021-11-06 NOTE — Telephone Encounter (Unsigned)
Copied from [place] [id number]. Topic: Quick Communication - Rx Refill/Question >> Nov 06, 2021 11:56 AM Yvette Rack wrote: Medication: pantoprazole (PROTONIX) 40 MG tablet and folic acid (FOLVITE) 1 MG tablet  Has the patient contacted their pharmacy? Yes.   (Agent: If no, request that the patient contact the pharmacy for the refill. If patient does not wish to contact the pharmacy document the reason why and proceed with request.) (Agent: If yes, when and what did the pharmacy advise?)  Preferred Pharmacy (with phone number or street name): [id number] - [place], [address] AT [place] Phone: [phone]  Fax: [phone]  Has the patient been seen for an appointment in the last year OR does the patient have an upcoming appointment? Yes.    Agent: Please be advised that RX refills may take up to 3 business days. We ask that you follow-up with your pharmacy.

## 2021-11-07 ENCOUNTER — Other Ambulatory Visit: Payer: Self-pay

## 2021-11-07 ENCOUNTER — Encounter: Payer: Self-pay | Admitting: Family

## 2021-11-07 ENCOUNTER — Ambulatory Visit: Payer: Medicaid Other | Attending: Family | Admitting: Family

## 2021-11-07 VITALS — BP 96/73 | HR 94 | Resp 18 | Ht 67.0 in | Wt 209.2 lb

## 2021-11-07 DIAGNOSIS — I5032 Chronic diastolic (congestive) heart failure: Secondary | ICD-10-CM | POA: Insufficient documentation

## 2021-11-07 DIAGNOSIS — R238 Other skin changes: Secondary | ICD-10-CM | POA: Diagnosis not present

## 2021-11-07 DIAGNOSIS — K7031 Alcoholic cirrhosis of liver with ascites: Secondary | ICD-10-CM | POA: Diagnosis not present

## 2021-11-07 DIAGNOSIS — F17210 Nicotine dependence, cigarettes, uncomplicated: Secondary | ICD-10-CM | POA: Diagnosis not present

## 2021-11-07 DIAGNOSIS — K766 Portal hypertension: Secondary | ICD-10-CM | POA: Insufficient documentation

## 2021-11-07 DIAGNOSIS — K7030 Alcoholic cirrhosis of liver without ascites: Secondary | ICD-10-CM | POA: Diagnosis not present

## 2021-11-07 DIAGNOSIS — I2782 Chronic pulmonary embolism: Secondary | ICD-10-CM

## 2021-11-07 DIAGNOSIS — Z09 Encounter for follow-up examination after completed treatment for conditions other than malignant neoplasm: Secondary | ICD-10-CM | POA: Diagnosis not present

## 2021-11-07 DIAGNOSIS — Z79899 Other long term (current) drug therapy: Secondary | ICD-10-CM | POA: Diagnosis not present

## 2021-11-07 DIAGNOSIS — E119 Type 2 diabetes mellitus without complications: Secondary | ICD-10-CM

## 2021-11-07 DIAGNOSIS — Z86711 Personal history of pulmonary embolism: Secondary | ICD-10-CM | POA: Diagnosis not present

## 2021-11-07 DIAGNOSIS — E1136 Type 2 diabetes mellitus with diabetic cataract: Secondary | ICD-10-CM | POA: Insufficient documentation

## 2021-11-07 DIAGNOSIS — G479 Sleep disorder, unspecified: Secondary | ICD-10-CM | POA: Insufficient documentation

## 2021-11-07 NOTE — Telephone Encounter (Signed)
Duplicate

## 2021-11-07 NOTE — Progress Notes (Signed)
Patient ID: Collin Byrd, male    DOB: 02/23/1961, 60 y.o.   MRN: [id number]  HPI  Collin Byrd is a 60 y/o male with a history of DM, cataract, PE, cirrhosis, previous tobacco/ alcohol use and chronic heart failure.   Echo report from 06/05/20 reviewed and showed an EF of >55% along with moderate LVH.   Has not been admitted or been in the ED in the last 6 months.   He presents today for a follow-up visit with a chief complaint of easy bruising. He has associated gradual weight gain & difficulty sleeping along with this. He denies any dizziness, abdominal distention, palpitations, pedal edema, chest pain, shortness of breath, cough or fatigue.  Endorses concern about the cost of his pantoprazole and that he can't afford it. Explained that his PCP sent in a RX for omeprazole that he can pick up. This info was written on his AVS for him.   There's also confusion about his entresto. Bottle that he has came from the mail order but he insists that he got this from medication management clinic. Staff tried to get through to [employer] to find out if he's already getting patient assistance but was on hold for 20 minutes so she had to hang up.   Past Medical History:  Diagnosis Date   Alcohol abuse    Ascites    Ascites due to alcoholic cirrhosis (HCC)    CHF (congestive heart failure) ([place])    Cirrhosis ([place])    Diabetes mellitus without complication ([place])    History of congestive heart failure 07/12/2020   History of methicillin resistant staphylococcus aureus (MRSA) 2016   Pneumonia 2021   Portal hypertension (HCC)    Pulmonary embolus (HCC)    SBP (spontaneous bacterial peritonitis) ([place]) 06/07/2020   Spontaneous bacterial peritonitis ([place])    Past Surgical History:  Procedure Laterality Date   CATARACT EXTRACTION     COLONOSCOPY WITH PROPOFOL N/A 01/03/2021   Procedure: COLONOSCOPY WITH PROPOFOL;  Surgeon: Lin Landsman, MD;  Location: [facility] ENDOSCOPY;  Service: Gastroenterology;   Laterality: N/A;   COLONOSCOPY WITH PROPOFOL N/A 09/18/2021   Procedure: COLONOSCOPY WITH PROPOFOL;  Surgeon: Lin Landsman, MD;  Location: [facility] ENDOSCOPY;  Service: Gastroenterology;  Laterality: N/A;   ESOPHAGOGASTRODUODENOSCOPY (EGD) WITH PROPOFOL N/A 01/03/2021   Procedure: ESOPHAGOGASTRODUODENOSCOPY (EGD) WITH PROPOFOL;  Surgeon: Lin Landsman, MD;  Location: [facility] ENDOSCOPY;  Service: Gastroenterology;  Laterality: N/A;   EYE SURGERY     INGUINAL HERNIA REPAIR Left 04/11/2021   Procedure: HERNIA REPAIR INGUINAL ADULT, open, WITH DIAGNOSTIC LAPAROSCOPY, AND ROBOT ASSISTED;  Surgeon: Jules Husbands, MD;  Location: [facility] ORS;  Service: General;  Laterality: Left;  provider requesting 2 hours / 120 minutes for procedure.   PARACENTESIS  2021   TESTICULAR EXPLORATION Left 04/11/2021   Procedure: TESTICULAR EXPLORATION AND REMOVAL OF LEFT TESTICLE;  Surgeon: Billey Co, MD;  Location: [facility] ORS;  Service: Urology;  Laterality: Left;   UMBILICAL HERNIA REPAIR N/A 04/04/2021   Procedure: HERNIA REPAIR UMBILICAL ADULT, open;  Surgeon: Jules Husbands, MD;  Location: [facility] ORS;  Service: General;  Laterality: N/A;   XI ROBOTIC ASSISTED INGUINAL HERNIA REPAIR WITH MESH Left 04/04/2021   Procedure: XI ROBOTIC ASSISTED INGUINAL HERNIA REPAIR WITH MESH;  Surgeon: Jules Husbands, MD;  Location: [facility] ORS;  Service: General;  Laterality: Left;   No family history on file. Social History   Tobacco Use   Smoking status: Every Day  Packs/day: 0.25    Types: Cigarettes   Smokeless tobacco: Never  Substance Use Topics   Alcohol use: Not Currently   No Known Allergies  Prior to Admission medications   Medication Sig Start Date End Date Taking? Authorizing Provider  blood glucose meter kit and supplies KIT Dispense based on patient and insurance preference. Use up to four times daily as directed. (FOR ICD-9 250.00, 250.01). 07/12/20  Yes Iloabachie, Chioma E, NP  folic acid (FOLVITE) 1 MG tablet  Take 1 tablet (1 mg total) by mouth daily. 10/15/21  Yes Iloabachie, Chioma E, NP  furosemide (LASIX) 40 MG tablet TAKE ONE TABLET BY MOUTH EVERY DAY 10/14/21 10/14/22 Yes Raenette Sakata A, FNP  omega-3 acid ethyl esters (LOVAZA) 1 g capsule TAKE 2 CAPSULES BY MOUTH IN THE MORNING AND TAKE 2 CAPSULES BY MOUTH AT BEDTIME. 10/22/21 10/22/22 Yes Jon Billings, NP  sacubitril-valsartan (ENTRESTO) 24-26 MG Take 1 tablet by mouth 2 (two) times daily. 10/14/21  Yes Idabelle Mcpeters, Otila Kluver A, FNP  spironolactone (ALDACTONE) 100 MG tablet Take 1 tablet (100 mg total) by mouth 2 (two) times daily. 10/14/21  Yes Darylene Price A, FNP  thiamine (VITAMIN B-1) 100 MG tablet TAKE ONE TABLET BY MOUTH EVERY DAY 10/21/21 10/21/22 Yes Jon Billings, NP  omeprazole (PRILOSEC) 20 MG capsule Take 1 capsule (20 mg total) by mouth daily. Patient not taking: Reported on 11/07/2021 10/29/21   Jon Billings, NP    Review of Systems  Constitutional:  Negative for activity change, appetite change and diaphoresis.  HENT:  Negative for congestion, postnasal drip and sore throat.   Respiratory:  Negative for cough, chest tightness and shortness of breath.   Cardiovascular:  Negative for chest pain, palpitations and leg swelling.  Gastrointestinal:  Negative for abdominal distention and abdominal pain.  Endocrine: Negative.   Genitourinary: Negative.   Musculoskeletal:  Negative for back pain, gait problem and neck pain.  Skin: Negative.   Allergic/Immunologic: Negative.   Neurological:  Negative for dizziness and light-headedness.  Hematological:  Negative for adenopathy. Bruises/bleeds easily.  Psychiatric/Behavioral:  Positive for sleep disturbance (sleeping on 2 pillows). Negative for dysphoric mood. The patient is not nervous/anxious.    Vitals:   11/07/21 1211  BP: 96/73  Pulse: 94  Resp: 18  SpO2: 99%  Weight: 209 lb 4 oz (94.9 kg)  Height: 5' 7"  (1.702 m)   Wt Readings from Last 3 Encounters:  11/07/21 209  lb 4 oz (94.9 kg)  10/21/21 200 lb (90.7 kg)  10/16/21 201 lb 1.6 oz (91.2 kg)   Lab Results  Component Value Date   CREATININE 1.00 10/21/2021   CREATININE 1.03 08/29/2021   CREATININE 0.77 04/30/2021    Physical Exam Vitals and nursing note reviewed.  Constitutional:      Appearance: Normal appearance.  HENT:     Head: Normocephalic and atraumatic.  Cardiovascular:     Rate and Rhythm: Normal rate and regular rhythm.  Pulmonary:     Effort: Pulmonary effort is normal. No respiratory distress.     Breath sounds: No wheezing or rales.  Abdominal:     General: There is no distension.     Palpations: Abdomen is soft.     Tenderness: There is no abdominal tenderness.  Musculoskeletal:        General: No tenderness.     Cervical back: Normal range of motion.     Right lower leg: No edema.     Left lower leg: No edema.  Skin:  General: Skin is warm and dry.  Neurological:     General: No focal deficit present.     Mental Status: He is alert and oriented to person, place, and time.  Psychiatric:        Mood and Affect: Mood normal.        Behavior: Behavior normal.        Thought Content: Thought content normal.   Assessment & Plan:  1: Chronic heart failure with preserved ejection fraction with structural changes (LVH)- - NYHA class I - euvolemic today - weighing daily; reminded to call for an overnight weight gain of >2 pounds or a weekly weight gain of >5 pounds - weight up 19 pounds from last visit here 6 months ago - not adding salt to his food but admits that he's eating too much - on entresto but no room to titrate; [employer] patient assistance forms filled out today - consider adding jardiance but want to make sure there's no more confusion with his meds first - will decrease his furosemide to 69m daily; this will be 1/2 tablet of his current dose - advised that if he has above weight gain or swelling, that he can take a whole tablet that day - patient able to  verbalize these instructions back to me - BMP 10/21/21 reviewed and showed sodium 138, potassium 4.6, creatinine 1.00 and GFR 87 - BNP 06/05/20 was 155.9  2: DM- - saw PCP ([facility] Dad 10/21/21 - glucose at home today was 123 - A1c 10/21/21 was 68.4% 3: Alcoholic cirrhosis- - says that he hasn't had any alcohol since June 2021. - saw GI ([facility]) 08/29/21; colonoscopy done 08/29/21  4: PE- - saw hematology ([facility] 04/30/21 - no longer taking xarelto & says that he doesn't have to return to hematology    Medication bottles were reviewed.   Return in 1 month or sooner for any questions/problems before then.

## 2021-11-07 NOTE — Patient Instructions (Addendum)
Continue weighing daily and call for an overnight weight gain of > 2 pounds or a weekly weight gain of >5 pounds.    Decrease your furosemide (fluid pill) to 1/2 tablet every day. If you have the above weight gain or notice an swelling, you can take a 1 tablet that day.    Check with [facility] in [place] as your primary care provider sent in Omeprazole on 10/29/21. This would replace the pantoprazole.

## 2021-11-13 ENCOUNTER — Ambulatory Visit: Payer: Medicaid Other | Admitting: Gerontology

## 2021-11-13 ENCOUNTER — Other Ambulatory Visit: Payer: Self-pay

## 2021-11-13 NOTE — Telephone Encounter (Signed)
PA for Lovaza initiated and submitted via Cover My Meds. Key: [id number]

## 2021-11-27 ENCOUNTER — Other Ambulatory Visit: Payer: Self-pay | Admitting: Family

## 2021-11-27 ENCOUNTER — Telehealth: Payer: Self-pay | Admitting: Family

## 2021-11-27 MED ORDER — ENTRESTO 24-26 MG PO TABS: 1.0000 | ORAL_TABLET | Freq: Two times a day (BID) | ORAL | 5 refills | Status: DC

## 2021-11-27 NOTE — Progress Notes (Signed)
Sent RX to [facility]

## 2021-12-03 ENCOUNTER — Other Ambulatory Visit: Payer: Self-pay | Admitting: Nurse Practitioner

## 2021-12-03 MED ORDER — OMEGA-3-ACID ETHYL ESTERS 1 G PO CAPS: ORAL_CAPSULE | ORAL | 1 refills | Status: DC

## 2021-12-03 NOTE — Telephone Encounter (Signed)
Requested Prescriptions  Pending Prescriptions Disp Refills  . omega-3 acid ethyl esters (LOVAZA) 1 g capsule 180 capsule 1    Sig: TAKE 2 CAPSULES BY MOUTH IN THE MORNING AND TAKE 2 CAPSULES BY MOUTH AT BEDTIME.     Endocrinology:  Nutritional Agents Passed - 12/03/2021  4:10 PM      Passed - Valid encounter within last 12 months    Recent Outpatient Visits          1 month ago Alcoholic cirrhosis, unspecified whether ascites present [facility])   [facility] Jon Billings, NP      Future Appointments            In 1 month Jon Billings, NP [facility] [place]

## 2021-12-03 NOTE — Telephone Encounter (Signed)
Medication Refill - Medication: omega-3 acid ethyl esters (LOVAZA) 1 g capsule  Has the patient contacted their pharmacy? yes (Agent: If no, request that the patient contact the pharmacy for the refill. If patient does not wish to contact the pharmacy document the reason why and proceed with request.) (Agent: If yes, when and what did the pharmacy advise?)contact pcp  Preferred Pharmacy (with phone number or street name): [id number] - [place], [address] AT [place] Phone:  [phone]  Fax:  [phone]     Has the patient been seen for an appointment in the last year OR does the patient have an upcoming appointment? yes  Agent: Please be advised that RX refills may take up to 3 business days. We ask that you follow-up with your pharmacy.

## 2021-12-09 ENCOUNTER — Ambulatory Visit: Payer: Medicaid Other | Admitting: Family

## 2021-12-10 NOTE — Progress Notes (Signed)
Patient ID: Collin Byrd, male    DOB: 20-Jan-1961, 60 y.o.   MRN: [id number]  HPI  Mr Hardenbrook is a 60 y/o male with a history of DM, cataract, PE, cirrhosis, previous tobacco/ alcohol use and chronic heart failure.   Echo report from 06/05/20 reviewed and showed an EF of >55% along with moderate LVH.   Has not been admitted or been in the ED in the last 6 months.   He presents today for a follow-up visit with a chief complaint of difficulty sleeping. He says that this occurs on an intermittent basis. He has associated easy bruising and slight weight gain along with this. He denies any dizziness, abdominal distention, palpitations pedal edema, chest pain, shortness of breath, cough or fatigue.   Past Medical History:  Diagnosis Date   Alcohol abuse    Ascites    Ascites due to alcoholic cirrhosis (HCC)    CHF (congestive heart failure) ([place])    Cirrhosis ([place])    Diabetes mellitus without complication ([place])    History of congestive heart failure 07/12/2020   History of methicillin resistant staphylococcus aureus (MRSA) 2016   Pneumonia 2021   Portal hypertension (HCC)    Pulmonary embolus (HCC)    SBP (spontaneous bacterial peritonitis) ([place]) 06/07/2020   Spontaneous bacterial peritonitis ([place])    Past Surgical History:  Procedure Laterality Date   CATARACT EXTRACTION     COLONOSCOPY WITH PROPOFOL N/A 01/03/2021   Procedure: COLONOSCOPY WITH PROPOFOL;  Surgeon: Lin Landsman, MD;  Location: [facility] ENDOSCOPY;  Service: Gastroenterology;  Laterality: N/A;   COLONOSCOPY WITH PROPOFOL N/A 09/18/2021   Procedure: COLONOSCOPY WITH PROPOFOL;  Surgeon: Lin Landsman, MD;  Location: [facility] ENDOSCOPY;  Service: Gastroenterology;  Laterality: N/A;   ESOPHAGOGASTRODUODENOSCOPY (EGD) WITH PROPOFOL N/A 01/03/2021   Procedure: ESOPHAGOGASTRODUODENOSCOPY (EGD) WITH PROPOFOL;  Surgeon: Lin Landsman, MD;  Location: [facility] ENDOSCOPY;  Service: Gastroenterology;  Laterality: N/A;   EYE  SURGERY     INGUINAL HERNIA REPAIR Left 04/11/2021   Procedure: HERNIA REPAIR INGUINAL ADULT, open, WITH DIAGNOSTIC LAPAROSCOPY, AND ROBOT ASSISTED;  Surgeon: Jules Husbands, MD;  Location: [facility];  Service: General;  Laterality: Left;  provider requesting 2 hours / 120 minutes for procedure.   PARACENTESIS  2021   TESTICULAR EXPLORATION Left 04/11/2021   Procedure: TESTICULAR EXPLORATION AND REMOVAL OF LEFT TESTICLE;  Surgeon: Billey Co, MD;  Location: [facility];  Service: Urology;  Laterality: Left;   UMBILICAL HERNIA REPAIR N/A 04/04/2021   Procedure: HERNIA REPAIR UMBILICAL ADULT, open;  Surgeon: Jules Husbands, MD;  Location: [facility];  Service: General;  Laterality: N/A;   XI ROBOTIC ASSISTED INGUINAL HERNIA REPAIR WITH MESH Left 04/04/2021   Procedure: XI ROBOTIC ASSISTED INGUINAL HERNIA REPAIR WITH MESH;  Surgeon: Jules Husbands, MD;  Location: [facility];  Service: General;  Laterality: Left;   No family history on file. Social History   Tobacco Use   Smoking status: Every Day    Packs/day: 0.25    Types: Cigarettes   Smokeless tobacco: Never  Substance Use Topics   Alcohol use: Not Currently   No Known Allergies  Prior to Admission medications   Medication Sig Start Date End Date Taking? Authorizing Provider  blood glucose meter kit and supplies KIT Dispense based on patient and insurance preference. Use up to four times daily as directed. (FOR ICD-9 250.00, 250.01). 07/12/20  Yes Iloabachie, Chioma E, NP  folic acid (FOLVITE) 1 MG tablet Take 1 tablet (  1 mg total) by mouth daily. 10/15/21  Yes Iloabachie, Chioma E, NP  furosemide (LASIX) 40 MG tablet TAKE ONE TABLET BY MOUTH EVERY DAY Patient taking differently: Take 20 mg by mouth daily. 10/14/21 10/14/22 Yes Sarae Nicholes, Aura Fey, FNP  Omega-3 Fatty Acids (FISH OIL) 1000 MG CAPS Take 2 capsules by mouth in the morning and at bedtime.   Yes [provider]  omeprazole (PRILOSEC) 20 MG capsule Take 1 capsule (20 mg total)  by mouth daily. 10/29/21  Yes Jon Billings, NP  sacubitril-valsartan (ENTRESTO) 24-26 MG Take 1 tablet by mouth 2 (two) times daily. 11/27/21  Yes Darylene Price A, FNP  spironolactone (ALDACTONE) 100 MG tablet Take 1 tablet (100 mg total) by mouth 2 (two) times daily. 10/14/21  Yes Darylene Price A, FNP  thiamine (VITAMIN B-1) 100 MG tablet TAKE ONE TABLET BY MOUTH EVERY DAY 10/21/21 10/21/22 Yes Jon Billings, NP   Review of Systems  Constitutional:  Negative for activity change, appetite change and fatigue.  HENT:  Negative for congestion, postnasal drip and sore throat.   Respiratory:  Negative for cough, chest tightness and shortness of breath.   Cardiovascular:  Negative for chest pain, palpitations and leg swelling.  Gastrointestinal:  Negative for abdominal distention and abdominal pain.  Endocrine: Negative.   Genitourinary: Negative.   Musculoskeletal:  Negative for back pain, gait problem and neck pain.  Skin: Negative.   Allergic/Immunologic: Negative.   Neurological:  Negative for dizziness and light-headedness.  Hematological:  Negative for adenopathy. Bruises/bleeds easily.  Psychiatric/Behavioral:  Positive for sleep disturbance (sleeping on 2 pillows). Negative for dysphoric mood. The patient is not nervous/anxious.    Vitals:   12/11/21 1226  BP: 112/62  Pulse: 88  Resp: 18  SpO2: 100%  Weight: 214 lb 4 oz (97.2 kg)  Height: 5' 7"  (1.702 m)   Wt Readings from Last 3 Encounters:  12/11/21 214 lb 4 oz (97.2 kg)  11/07/21 209 lb 4 oz (94.9 kg)  10/21/21 200 lb (90.7 kg)   Lab Results  Component Value Date   CREATININE 1.00 10/21/2021   CREATININE 1.03 08/29/2021   CREATININE 0.77 04/30/2021   Physical Exam Vitals and nursing note reviewed.  Constitutional:      Appearance: Normal appearance.  HENT:     Head: Normocephalic and atraumatic.  Cardiovascular:     Rate and Rhythm: Normal rate and regular rhythm.  Pulmonary:     Effort: Pulmonary effort  is normal. No respiratory distress.     Breath sounds: No wheezing or rales.  Abdominal:     General: There is no distension.     Palpations: Abdomen is soft.     Tenderness: There is no abdominal tenderness.  Musculoskeletal:        General: No tenderness.     Cervical back: Normal range of motion.     Right lower leg: No edema.     Left lower leg: No edema.  Skin:    General: Skin is warm and dry.  Neurological:     General: No focal deficit present.     Mental Status: He is alert and oriented to person, place, and time.  Psychiatric:        Mood and Affect: Mood normal.        Behavior: Behavior normal.        Thought Content: Thought content normal.   Assessment & Plan:  1: Chronic heart failure with preserved ejection fraction with structural changes (LVH)- - NYHA  class I - euvolemic today - weighing daily; reminded to call for an overnight weight gain of >2 pounds or a weekly weight gain of >5 pounds - weight up 5 pounds from last visit here 1 month ago - not adding salt to his food but admits that he's eating too much - on GDMT of entresto - consider adding jardiance in the future - BMP 10/21/21 reviewed and showed sodium 138, potassium 4.6, creatinine 1.00 and GFR 87 - BNP 06/05/20 was 155.9  2: DM- - saw PCP Mathis Dad) 10/21/21 - glucose at home today was 162 after eating fruit loops - A1c 10/21/21 was 8.6%  3: Alcoholic cirrhosis- - says that he hasn't had any alcohol since June 2021. - saw GI ([facility]) 08/29/21; colonoscopy done 08/29/21   Medication bottles were reviewed.   Return in 6 months or sooner for any questions/problems before then.

## 2021-12-10 NOTE — Telephone Encounter (Signed)
Opened in error

## 2021-12-11 ENCOUNTER — Ambulatory Visit: Payer: Medicaid Other | Attending: Family | Admitting: Family

## 2021-12-11 ENCOUNTER — Encounter: Payer: Self-pay | Admitting: Family

## 2021-12-11 ENCOUNTER — Other Ambulatory Visit: Payer: Self-pay

## 2021-12-11 VITALS — BP 112/62 | HR 88 | Resp 18 | Ht 67.0 in | Wt 214.2 lb

## 2021-12-11 DIAGNOSIS — Z79899 Other long term (current) drug therapy: Secondary | ICD-10-CM | POA: Diagnosis not present

## 2021-12-11 DIAGNOSIS — K7030 Alcoholic cirrhosis of liver without ascites: Secondary | ICD-10-CM | POA: Insufficient documentation

## 2021-12-11 DIAGNOSIS — E119 Type 2 diabetes mellitus without complications: Secondary | ICD-10-CM

## 2021-12-11 DIAGNOSIS — F1091 Alcohol use, unspecified, in remission: Secondary | ICD-10-CM | POA: Diagnosis not present

## 2021-12-11 DIAGNOSIS — K7031 Alcoholic cirrhosis of liver with ascites: Secondary | ICD-10-CM | POA: Diagnosis not present

## 2021-12-11 DIAGNOSIS — Z87891 Personal history of nicotine dependence: Secondary | ICD-10-CM | POA: Insufficient documentation

## 2021-12-11 DIAGNOSIS — E1136 Type 2 diabetes mellitus with diabetic cataract: Secondary | ICD-10-CM | POA: Diagnosis not present

## 2021-12-11 DIAGNOSIS — I5032 Chronic diastolic (congestive) heart failure: Secondary | ICD-10-CM | POA: Diagnosis not present

## 2021-12-11 DIAGNOSIS — Z86711 Personal history of pulmonary embolism: Secondary | ICD-10-CM | POA: Diagnosis not present

## 2021-12-11 NOTE — Patient Instructions (Addendum)
Resume weighing daily and call for an overnight weight gain of 3 pounds or more or a weekly weight gain of more than 5 pounds.  °

## 2022-01-03 ENCOUNTER — Telehealth: Payer: Self-pay | Admitting: Gerontology

## 2022-01-03 DIAGNOSIS — F1011 Alcohol abuse, in remission: Secondary | ICD-10-CM

## 2022-01-06 ENCOUNTER — Telehealth: Payer: Self-pay | Admitting: Family

## 2022-01-06 NOTE — Telephone Encounter (Signed)
LVM with patient reminding him I need proof of income for his [employer] patient assistance application for Entresto as soon as possible.   Captain Blucher, NT

## 2022-01-20 ENCOUNTER — Telehealth: Payer: Self-pay | Admitting: Nurse Practitioner

## 2022-01-20 DIAGNOSIS — F1011 Alcohol abuse, in remission: Secondary | ICD-10-CM

## 2022-01-20 MED ORDER — FOLIC ACID 1 MG PO TABS: 1.0000 mg | ORAL_TABLET | Freq: Every day | ORAL | 1 refills | Status: DC

## 2022-01-20 NOTE — Telephone Encounter (Signed)
Medication sent to the pharmacy.

## 2022-01-20 NOTE — Telephone Encounter (Signed)
Copied from [place] [id number]. Topic: General - Other >> Jan 20, 2022  2:12 PM Valere Dross wrote: Reason for CRM: Pt called in stating his folic acid (FOLVITE) 1 MG tablet  medication was out of stock at the [facility] in [place] and requested if it could be sent to another. Please advise.  Spoke with pt and he said [facility] in [place] works.

## 2022-01-20 NOTE — Telephone Encounter (Signed)
Patient states pharmacy advised patient to call PCP to find out the status of folic acid (FOLVITE) 1 MG tablet request. Patient would like a follow up call.

## 2022-01-20 NOTE — Telephone Encounter (Signed)
Iloabachie, Chioma E, NP is the ordering user

## 2022-01-20 NOTE — Telephone Encounter (Signed)
Attempted to contact patient no answer LVM advising medication has been sent over.

## 2022-01-22 ENCOUNTER — Encounter: Payer: Medicaid Other | Admitting: Nurse Practitioner

## 2022-01-23 ENCOUNTER — Other Ambulatory Visit: Payer: Self-pay

## 2022-01-23 ENCOUNTER — Encounter: Payer: Self-pay | Admitting: Nurse Practitioner

## 2022-01-23 ENCOUNTER — Ambulatory Visit (INDEPENDENT_AMBULATORY_CARE_PROVIDER_SITE_OTHER): Payer: Medicaid Other | Admitting: Nurse Practitioner

## 2022-01-23 VITALS — BP 104/69 | HR 84 | Temp 98.7°F | Ht 67.01 in | Wt 213.8 lb

## 2022-01-23 DIAGNOSIS — Z0000 Encounter for general adult medical examination without abnormal findings: Secondary | ICD-10-CM

## 2022-01-23 DIAGNOSIS — K7030 Alcoholic cirrhosis of liver without ascites: Secondary | ICD-10-CM | POA: Diagnosis not present

## 2022-01-23 DIAGNOSIS — I5032 Chronic diastolic (congestive) heart failure: Secondary | ICD-10-CM | POA: Diagnosis not present

## 2022-01-23 DIAGNOSIS — E1165 Type 2 diabetes mellitus with hyperglycemia: Secondary | ICD-10-CM

## 2022-01-23 DIAGNOSIS — Z136 Encounter for screening for cardiovascular disorders: Secondary | ICD-10-CM

## 2022-01-23 DIAGNOSIS — Z720 Tobacco use: Secondary | ICD-10-CM

## 2022-01-23 LAB — URINALYSIS, ROUTINE W REFLEX MICROSCOPIC
Bilirubin, UA: NEGATIVE
Glucose, UA: NEGATIVE
Ketones, UA: NEGATIVE
Leukocytes,UA: NEGATIVE
Nitrite, UA: NEGATIVE
Protein,UA: NEGATIVE
RBC, UA: NEGATIVE
Specific Gravity, UA: 1.015 (ref 1.005–1.030)
Urobilinogen, Ur: 0.2 mg/dL (ref 0.2–1.0)
pH, UA: 5.5 (ref 5.0–7.5)

## 2022-01-23 NOTE — Assessment & Plan Note (Signed)
Chronic.  Controlled.  Continue with current medication regimen. Continue to follow up with Cardiology.  Labs ordered today.  Return to clinic in 6 months for reevaluation.  Call sooner if concerns arise.   

## 2022-01-23 NOTE — Assessment & Plan Note (Signed)
Current everyday smoker. Recommend complete smoking cessation.

## 2022-01-23 NOTE — Progress Notes (Signed)
BP 104/69    Pulse 84    Temp 98.7 F (37.1 C) (Oral)    Ht 5' 7.01" (1.702 m)    Wt 213 lb 12.8 oz (97 kg)    SpO2 98%    BMI 33.48 kg/m    Subjective:    Patient ID: Collin Byrd, male    DOB: 20-Oct-1961, 61 y.o.   MRN: [id number]  HPI: Collin Byrd is a 61 y.o. male presenting on 01/23/2022 for comprehensive medical examination. Current medical complaints include:none  He currently lives with: Interim Problems from his last visit: no  HYPERTENSION / HYPERLIPIDEMIA Satisfied with current treatment? yes Duration of hypertension: years BP monitoring frequency: daily BP range: 135/80 BP medication side effects: no Past BP meds: spironalactone and valsartan Duration of hyperlipidemia: years Cholesterol medication side effects: no Cholesterol supplements: fish oil Past cholesterol medications: none Medication compliance: excellent compliance Aspirin: no Recent stressors: no Recurrent headaches: no Visual changes: no Palpitations: no Dyspnea: no Chest pain: no Lower extremity edema: no Dizzy/lightheaded: no  DIABETES Hypoglycemic episodes:no Polydipsia/polyuria: no Visual disturbance: no Chest pain: no Paresthesias: no Glucose Monitoring: no  Accucheck frequency: Not Checking  Fasting glucose:  Post prandial:  Evening:  Before meals: Taking Insulin?: no  Long acting insulin:  Short acting insulin: Blood Pressure Monitoring: daily Retinal Examination: Up to Date Foot Exam: Up to Date Diabetic Education: Not Completed Pneumovax: Up to Date Influenza: Not up to Date Aspirin: no  Depression Screen done today and results listed below:  Depression screen [facility] 2/9 01/23/2022 11/07/2021 10/21/2021  Decreased Interest - 0 2  Down, Depressed, Hopeless 0 1 2  PHQ - 2 Score 0 1 4  Altered sleeping 0 - 1  Tired, decreased energy 0 - 0  Change in appetite 0 - 0  Feeling bad or failure about yourself  0 - 0  Trouble concentrating 0 - 0  Moving slowly or  fidgety/restless 0 - 0  Suicidal thoughts 0 - 0  PHQ-9 Score 0 - 5  Difficult doing work/chores Not difficult at all - -    The patient does not have a history of falls. I did complete a risk assessment for falls. A plan of care for falls was documented.   Past Medical History:  Past Medical History:  Diagnosis Date   Alcohol abuse    Ascites    Ascites due to alcoholic cirrhosis (HCC)    CHF (congestive heart failure) ([employer])    Cirrhosis (HCC)    Diabetes mellitus without complication ([place])    History of congestive heart failure 07/12/2020   History of methicillin resistant staphylococcus aureus (MRSA) 2016   Pneumonia 2021   Portal hypertension (HCC)    Pulmonary embolus (HCC)    SBP (spontaneous bacterial peritonitis) ([place]) 06/07/2020   Spontaneous bacterial peritonitis ([place])     Surgical History:  Past Surgical History:  Procedure Laterality Date   CATARACT EXTRACTION     COLONOSCOPY WITH PROPOFOL N/A 01/03/2021   Procedure: COLONOSCOPY WITH PROPOFOL;  Surgeon: Lin Landsman, MD;  Location: [facility] ENDOSCOPY;  Service: Gastroenterology;  Laterality: N/A;   COLONOSCOPY WITH PROPOFOL N/A 09/18/2021   Procedure: COLONOSCOPY WITH PROPOFOL;  Surgeon: Lin Landsman, MD;  Location: [facility] ENDOSCOPY;  Service: Gastroenterology;  Laterality: N/A;   ESOPHAGOGASTRODUODENOSCOPY (EGD) WITH PROPOFOL N/A 01/03/2021   Procedure: ESOPHAGOGASTRODUODENOSCOPY (EGD) WITH PROPOFOL;  Surgeon: Lin Landsman, MD;  Location: [facility] ENDOSCOPY;  Service: Gastroenterology;  Laterality: N/A;   EYE SURGERY  INGUINAL HERNIA REPAIR Left 04/11/2021   Procedure: HERNIA REPAIR INGUINAL ADULT, open, WITH DIAGNOSTIC LAPAROSCOPY, AND ROBOT ASSISTED;  Surgeon: Jules Husbands, MD;  Location: [facility] ORS;  Service: General;  Laterality: Left;  provider requesting 2 hours / 120 minutes for procedure.   PARACENTESIS  2021   TESTICULAR EXPLORATION Left 04/11/2021   Procedure: TESTICULAR EXPLORATION AND REMOVAL OF  LEFT TESTICLE;  Surgeon: Billey Co, MD;  Location: [facility] ORS;  Service: Urology;  Laterality: Left;   UMBILICAL HERNIA REPAIR N/A 04/04/2021   Procedure: HERNIA REPAIR UMBILICAL ADULT, open;  Surgeon: Jules Husbands, MD;  Location: [facility] ORS;  Service: General;  Laterality: N/A;   XI ROBOTIC ASSISTED INGUINAL HERNIA REPAIR WITH MESH Left 04/04/2021   Procedure: XI ROBOTIC ASSISTED INGUINAL HERNIA REPAIR WITH MESH;  Surgeon: Jules Husbands, MD;  Location: [facility] ORS;  Service: General;  Laterality: Left;    Medications:  Current Outpatient Medications on File Prior to Visit  Medication Sig   blood glucose meter kit and supplies KIT Dispense based on patient and insurance preference. Use up to four times daily as directed. (FOR ICD-9 250.00, 250.01).   folic acid (FOLVITE) 1 MG tablet Take 1 tablet (1 mg total) by mouth daily.   furosemide (LASIX) 40 MG tablet TAKE ONE TABLET BY MOUTH EVERY DAY (Patient taking differently: Take 20 mg by mouth daily.)   Omega-3 Fatty Acids (FISH OIL) 1000 MG CAPS Take 2 capsules by mouth in the morning and at bedtime.   omeprazole (PRILOSEC) 20 MG capsule Take 1 capsule (20 mg total) by mouth daily.   sacubitril-valsartan (ENTRESTO) 24-26 MG Take 1 tablet by mouth 2 (two) times daily.   spironolactone (ALDACTONE) 100 MG tablet Take 1 tablet (100 mg total) by mouth 2 (two) times daily.   thiamine (VITAMIN B-1) 100 MG tablet TAKE ONE TABLET BY MOUTH EVERY DAY   No current facility-administered medications on file prior to visit.    Allergies:  No Known Allergies  Social History:  Social History   Socioeconomic History   Marital status: Married    Spouse name: Not on file   Number of children: Not on file   Years of education: Not on file   Highest education level: Not on file  Occupational History   Not on file  Tobacco Use   Smoking status: Every Day    Packs/day: 0.25    Types: Cigarettes   Smokeless tobacco: Never  Vaping Use   Vaping Use:  Never used  Substance and Sexual Activity   Alcohol use: Not Currently   Drug use: Never   Sexual activity: Not Currently  Other Topics Concern   Not on file  Social History Narrative   No working now; used to work housekeeping; 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 Smoking- 1-3 cig/day. Lives in [place]; with wife. No children.    Social Determinants of Health   Financial Resource Strain: Not on file  Food Insecurity: Not on file  Transportation Needs: Not on file  Physical Activity: Not on file  Stress: Not on file  Social Connections: Not on file  Intimate Partner Violence: Not on file   Social History   Tobacco Use  Smoking Status Every Day   Packs/day: 0.25   Types: Cigarettes  Smokeless Tobacco Never   Social History   Substance and Sexual Activity  Alcohol Use Not Currently    Family History:  History reviewed. No pertinent family history.  Past medical history, surgical history, medications,  allergies, family history and social history reviewed with patient today and changes made to appropriate areas of the chart.   Review of Systems  HENT:         Denies vision changes.  Eyes:  Negative for blurred vision and double vision.  Respiratory:  Negative for shortness of breath.   Cardiovascular:  Negative for chest pain, palpitations and leg swelling.  Neurological:  Negative for dizziness, tingling and headaches.  Endo/Heme/Allergies:  Negative for polydipsia.       Denies Polyuria  All other ROS negative except what is listed above and in the HPI.      Objective:    BP 104/69    Pulse 84    Temp 98.7 F (37.1 C) (Oral)    Ht 5' 7.01" (1.702 m)    Wt 213 lb 12.8 oz (97 kg)    SpO2 98%    BMI 33.48 kg/m   Wt Readings from Last 3 Encounters:  01/23/22 213 lb 12.8 oz (97 kg)  12/11/21 214 lb 4 oz (97.2 kg)  11/07/21 209 lb 4 oz (94.9 kg)    Physical Exam Vitals and nursing note reviewed.  Constitutional:      General: He is not in acute distress.     Appearance: Normal appearance. He is obese. He is not ill-appearing, toxic-appearing or diaphoretic.  HENT:     Head: Normocephalic.     Right Ear: Tympanic membrane, ear canal and external ear normal.     Left Ear: Tympanic membrane, ear canal and external ear normal.     Nose: Nose normal. No congestion or rhinorrhea.     Mouth/Throat:     Mouth: Mucous membranes are moist.  Eyes:     General:        Right eye: No discharge.        Left eye: No discharge.     Extraocular Movements: Extraocular movements intact.     Conjunctiva/sclera: Conjunctivae normal.     Pupils: Pupils are equal, round, and reactive to light.  Cardiovascular:     Rate and Rhythm: Normal rate and regular rhythm.     Heart sounds: No murmur heard. Pulmonary:     Effort: Pulmonary effort is normal. No respiratory distress.     Breath sounds: Normal breath sounds. No wheezing, rhonchi or rales.  Abdominal:     General: Abdomen is flat. Bowel sounds are normal. There is no distension.     Palpations: Abdomen is soft.     Tenderness: There is no abdominal tenderness. There is no guarding.  Musculoskeletal:     Cervical back: Normal range of motion and neck supple.  Skin:    General: Skin is warm and dry.     Capillary Refill: Capillary refill takes less than 2 seconds.  Neurological:     General: No focal deficit present.     Mental Status: He is alert and oriented to person, place, and time.     Cranial Nerves: No cranial nerve deficit.     Motor: No weakness.     Deep Tendon Reflexes: Reflexes normal.  Psychiatric:        Mood and Affect: Mood normal.        Behavior: Behavior normal.        Thought Content: Thought content normal.        Judgment: Judgment normal.    Results for orders placed or performed in visit on 10/21/21  Comp Met (CMET)  Result Value Ref Range  Glucose 101 (H) 70 - 99 mg/dL   BUN 19 6 - 24 mg/dL   Creatinine, Ser 1.00 0.76 - 1.27 mg/dL   eGFR 87 >59 mL/min/1.73    BUN/Creatinine Ratio 19 9 - 20   Sodium 138 134 - 144 mmol/L   Potassium 4.6 3.5 - 5.2 mmol/L   Chloride 101 96 - 106 mmol/L   CO2 24 20 - 29 mmol/L   Calcium 9.4 8.7 - 10.2 mg/dL   Total Protein 7.8 6.0 - 8.5 g/dL   Albumin 4.5 3.8 - 4.9 g/dL   Globulin, Total 3.3 1.5 - 4.5 g/dL   Albumin/Globulin Ratio 1.4 1.2 - 2.2   Bilirubin Total 0.6 0.0 - 1.2 mg/dL   Alkaline Phosphatase 74 44 - 121 IU/L   AST 18 0 - 40 IU/L   ALT 19 0 - 44 IU/L  HgB A1c  Result Value Ref Range   Hgb A1c MFr Bld 6.0 (H) 4.8 - 5.6 %   Est. average glucose Bld gHb Est-mCnc 126 mg/dL  Lipid Profile  Result Value Ref Range   Cholesterol, Total 170 100 - 199 mg/dL   Triglycerides 172 (H) 0 - 149 mg/dL   HDL 47 >39 mg/dL   VLDL Cholesterol Cal 30 5 - 40 mg/dL   LDL Chol Calc (NIH) 93 0 - 99 mg/dL   Chol/HDL Ratio 3.6 0.0 - 5.0 ratio  CBC w/Diff  Result Value Ref Range   WBC 8.3 3.4 - 10.8 x10E3/uL   RBC 3.98 (L) 4.14 - 5.80 x10E6/uL   Hemoglobin 12.3 (L) 13.0 - 17.7 g/dL   Hematocrit 36.7 (L) 37.5 - 51.0 %   MCV 92 79 - 97 fL   MCH 30.9 26.6 - 33.0 pg   MCHC 33.5 31.5 - 35.7 g/dL   RDW 13.8 11.6 - 15.4 %   Platelets 249 150 - 450 x10E3/uL   Neutrophils 78 Not Estab. %   Lymphs 10 Not Estab. %   Monocytes 9 Not Estab. %   Eos 1 Not Estab. %   Basos 1 Not Estab. %   Neutrophils Absolute 6.5 1.4 - 7.0 x10E3/uL   Lymphocytes Absolute 0.8 0.7 - 3.1 x10E3/uL   Monocytes Absolute 0.8 0.1 - 0.9 x10E3/uL   EOS (ABSOLUTE) 0.1 0.0 - 0.4 x10E3/uL   Basophils Absolute 0.0 0.0 - 0.2 x10E3/uL   Immature Granulocytes 1 Not Estab. %   Immature Grans (Abs) 0.1 0.0 - 0.1 x10E3/uL      Assessment & Plan:   Problem List Items Addressed This Visit       Cardiovascular and Mediastinum   Chronic diastolic heart failure (HCC)    Chronic.  Controlled.  Continue with current medication regimen.  Continue to follow up with Cardiology.  Labs ordered today.  Return to clinic in 6 months for reevaluation.  Call sooner if  concerns arise.          Digestive   Hepatic cirrhosis (HCC)    Chronic.  Controlled.  Continue with current medication regimen.  Labs ordered today.  Return to clinic in 6 months for reevaluation.  Call sooner if concerns arise.          Endocrine   Type 2 diabetes mellitus with hyperglycemia, without long-term current use of insulin (HCC)    Chronic.  Controlled without medication.  Labs ordered today.  Return to clinic in 6 months for reevaluation.  Call sooner if concerns arise.        Relevant Orders  HgB A1c     Other   Tobacco abuse    Current everyday smoker. Recommend complete smoking cessation.      Other Visit Diagnoses     Annual physical exam    -  Primary   Health mainteance reviewed during visit today. Refused vaccines.  Labs ordered today. Colonoscopy up to date.    Relevant Orders   TSH   PSA   Lipid panel   CBC with Differential/Platelet   Comprehensive metabolic panel   Urinalysis, Routine w reflex microscopic   HgB A1c   Screening for ischemic heart disease       Relevant Orders   Lipid panel        Discussed aspirin prophylaxis for myocardial infarction prevention and decision was it was not indicated  LABORATORY TESTING:  Health maintenance labs ordered today as discussed above.   The natural history of prostate cancer and ongoing controversy regarding screening and potential treatment outcomes of prostate cancer has been discussed with the patient. The meaning of a false positive PSA and a false negative PSA has been discussed. He indicates understanding of the limitations of this screening test and wishes to proceed with screening PSA testing.   IMMUNIZATIONS:   - Tdap: Tetanus vaccination status reviewed: refused. - Influenza: Refused - Pneumovax: Up to date - Prevnar: Not applicable - COVID: Refused - HPV: Not applicable - Shingrix vaccine: Refused  SCREENING: - Colonoscopy: Up to date  Discussed with patient purpose of the  colonoscopy is to detect colon cancer at curable precancerous or early stages   - AAA Screening: Not applicable  -Hearing Test: Not applicable  -Spirometry: Not applicable   PATIENT COUNSELING:    Sexuality: Discussed sexually transmitted diseases, partner selection, use of condoms, avoidance of unintended pregnancy  and contraceptive alternatives.   Advised to avoid cigarette smoking.  I discussed with the patient that most people either abstain from alcohol or drink within safe limits (<=14/week and <=4 drinks/occasion for males, <=7/weeks and <= 3 drinks/occasion for females) and that the risk for alcohol disorders and other health effects rises proportionally with the number of drinks per week and how often a drinker exceeds daily limits.  Discussed cessation/primary prevention of drug use and availability of treatment for abuse.   Diet: Encouraged to adjust caloric intake to maintain  or achieve ideal body weight, to reduce intake of dietary saturated fat and total fat, to limit sodium intake by avoiding high sodium foods and not adding table salt, and to maintain adequate dietary potassium and calcium preferably from fresh fruits, vegetables, and low-fat dairy products.    stressed the importance of regular exercise  Injury prevention: Discussed safety belts, safety helmets, smoke detector, smoking near bedding or upholstery.   Dental health: Discussed importance of regular tooth brushing, flossing, and dental visits.   Follow up plan: NEXT PREVENTATIVE PHYSICAL DUE IN 1 YEAR. Return in about 6 months (around 07/23/2022) for HTN, HLD, DM2 FU.

## 2022-01-23 NOTE — Assessment & Plan Note (Signed)
Chronic.  Controlled.  Continue with current medication regimen.  Labs ordered today.  Return to clinic in 6 months for reevaluation.  Call sooner if concerns arise.  ? ?

## 2022-01-23 NOTE — Assessment & Plan Note (Signed)
Chronic.  Controlled without medication..  Labs ordered today.  Return to clinic in 6 months for reevaluation.  Call sooner if concerns arise.  ° °

## 2022-01-24 LAB — LIPID PANEL
Chol/HDL Ratio: 3.6 ratio (ref 0.0–5.0)
Cholesterol, Total: 186 mg/dL (ref 100–199)
HDL: 51 mg/dL (ref >39)
LDL Chol Calc (NIH): 121 mg/dL — ABNORMAL HIGH (ref 0–99)
Triglycerides: 75 mg/dL (ref 0–149)
VLDL Cholesterol Cal: 14 mg/dL (ref 5–40)

## 2022-01-24 LAB — CBC WITH DIFFERENTIAL/PLATELET
Basophils Absolute: 0.1 10*3/uL (ref 0.0–0.2)
Basos: 0 %
EOS (ABSOLUTE): 0.1 10*3/uL (ref 0.0–0.4)
Eos: 1 %
Hematocrit: 39.1 % (ref 37.5–51.0)
Hemoglobin: 13 g/dL (ref 13.0–17.7)
Immature Grans (Abs): 0.1 10*3/uL (ref 0.0–0.1)
Immature Granulocytes: 1 %
Lymphocytes Absolute: 1.1 10*3/uL (ref 0.7–3.1)
Lymphs: 10 %
MCH: 29.8 pg (ref 26.6–33.0)
MCHC: 33.2 g/dL (ref 31.5–35.7)
MCV: 90 fL (ref 79–97)
Monocytes Absolute: 0.8 10*3/uL (ref 0.1–0.9)
Monocytes: 8 %
Neutrophils Absolute: 9.1 10*3/uL — ABNORMAL HIGH (ref 1.4–7.0)
Neutrophils: 80 %
Platelets: 242 10*3/uL (ref 150–450)
RBC: 4.36 x10E6/uL (ref 4.14–5.80)
RDW: 13.3 % (ref 11.6–15.4)
WBC: 11.2 10*3/uL — ABNORMAL HIGH (ref 3.4–10.8)

## 2022-01-24 LAB — HEMOGLOBIN A1C
Est. average glucose Bld gHb Est-mCnc: 131 mg/dL
Hgb A1c MFr Bld: 6.2 % — ABNORMAL HIGH (ref 4.8–5.6)

## 2022-01-24 LAB — COMPREHENSIVE METABOLIC PANEL
ALT: 17 IU/L (ref 0–44)
AST: 17 IU/L (ref 0–40)
Albumin/Globulin Ratio: 1.5 (ref 1.2–2.2)
Albumin: 4.8 g/dL (ref 3.8–4.9)
Alkaline Phosphatase: 65 IU/L (ref 44–121)
BUN/Creatinine Ratio: 20 (ref 9–20)
BUN: 20 mg/dL (ref 6–24)
Bilirubin Total: 0.8 mg/dL (ref 0.0–1.2)
CO2: 20 mmol/L (ref 20–29)
Calcium: 9.7 mg/dL (ref 8.7–10.2)
Chloride: 99 mmol/L (ref 96–106)
Creatinine, Ser: 0.99 mg/dL (ref 0.76–1.27)
Globulin, Total: 3.2 g/dL (ref 1.5–4.5)
Glucose: 116 mg/dL — ABNORMAL HIGH (ref 70–99)
Potassium: 4.8 mmol/L (ref 3.5–5.2)
Sodium: 138 mmol/L (ref 134–144)
Total Protein: 8 g/dL (ref 6.0–8.5)
eGFR: 88 mL/min/{1.73_m2} (ref >59)

## 2022-01-24 LAB — TSH: TSH: 1.47 u[IU]/mL (ref 0.450–4.500)

## 2022-01-24 LAB — PSA: Prostate Specific Ag, Serum: 0.7 ng/mL (ref 0.0–4.0)

## 2022-01-24 NOTE — Progress Notes (Signed)
Please let patient know that his lab work looks good.  Cholesterol is slightly elevated from prior. Recommend a low fat diet.  His A1c remains well controlled at 6.2.  Other blood work looks good.  Continue with current medication regimen.  Follow up as discussed.

## 2022-02-04 ENCOUNTER — Telehealth: Payer: Self-pay | Admitting: Family

## 2022-02-04 NOTE — Telephone Encounter (Signed)
Had interpreter call patient to notify him that we need proof of income for patient for his [employer] application for entresto patient assistance.    Nathaly Dawkins, NT

## 2022-03-06 ENCOUNTER — Other Ambulatory Visit: Payer: Self-pay

## 2022-03-06 ENCOUNTER — Encounter: Payer: Self-pay | Admitting: Gastroenterology

## 2022-03-06 ENCOUNTER — Ambulatory Visit (INDEPENDENT_AMBULATORY_CARE_PROVIDER_SITE_OTHER): Payer: Medicaid Other | Admitting: Gastroenterology

## 2022-03-06 VITALS — BP 107/69 | HR 72 | Temp 98.0°F | Ht 67.0 in | Wt 216.1 lb

## 2022-03-06 DIAGNOSIS — K7031 Alcoholic cirrhosis of liver with ascites: Secondary | ICD-10-CM

## 2022-03-06 NOTE — Patient Instructions (Signed)
Your RUQ ultrasound is schedule for 03/12/22 arrive at 9:00am for a 9:30am scan. Nothing to eat or drink after midnight  ?

## 2022-03-06 NOTE — Progress Notes (Signed)
[name], MD [address]  [place]  [place], [place] [address]  Main: [phone]  Fax: [phone]    Gastroenterology Consultation  Referring Provider:     Jon Billings, NP Primary Care Physician:  Jon Billings, NP Primary Gastroenterologist:  Dr. [name] Reason for Consultation:     Cirrhosis of liver        HPI:   Collin Byrd is a 61 y.o. male referred by Dr. Jon Billings, NP  for consultation & management of cirrhosis of liver.  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  He also has history of pneumonia and pulmonary embolism, is currently on anticoagulation.  Patient is followed by hematologist, Dr. Rogue Bussing.  Patient underwent therapeutic paracentesis in June 2021, was diagnosed with SBP as well as fluid analysis consistent with portal hypertension.  He was treated with antibiotics and was discharged home on Lasix as well as spironolactone.  Patient stopped drinking alcohol since June and is adherent to his medications.  He reports doing well since then.  He denies swelling of abdomen, distention of abdomen, swelling of legs.  He denies black stools, rectal bleeding, hematemesis or abdominal pain.  His most recent labs revealed mild normocytic anemia, normal LFTs and renal function.  He is currently on oral iron, thiamine and folate. He is on disability, currently not working  CT chest 11/01/2020 revealed resolution of prior pulmonary embolus  Follow-up visit 02/26/2021 Patient is doing well from cirrhosis standpoint.  He does not have swelling of legs or abdominal distention.  He is taking spironolactone 100 g daily.  He underwent upper endoscopy which revealed mild gastropathy only.  Gastric biopsies also revealed chronic active gastritis.  Colonoscopy was incomplete due to presence of large left inguinal hernia.  Patient is referred to general surgery, has appointment with Dr. Dahlia Byes on  3/9  Follow-up visit 08/29/2021 Patient is doing well from cirrhosis standpoint.  He underwent repair of incarcerated inguinal hernia in 03/2021.  Patient reports that he has been doing well and recovered well from surgery.  He denies any swelling of legs, abdominal distention.  He is gaining weight and tolerating diet well.  He denies any constipation.  He denies any alcohol use  Follow-up visit 03/06/2022 Patient is here for follow-up of cirrhosis.  He is doing well from cirrhosis standpoint.  He reports mild swelling of his feet.  He is taking spironolactone 100 mg twice daily and Lasix 20 mg once a day.  He does not have any concerns today.  He gained few pounds.  His most recent laboratory data from end of January 2023 revealed normal LFTs, BMP, as well as hemoglobin.  No evidence of thrombocytopenia  NSAIDs: None  Antiplts/Anticoagulants/Anti thrombotics: Completed the treatment for Xarelto for history of PE after 6 months of anticoagulation  GI Procedures:  EGD and colonoscopy 01/03/2021 - Normal duodenal bulb and second portion of the duodenum. - Portal hypertensive gastropathy. Biopsied. - A single gastric polyp. Biopsied. Clip (MR conditional) was placed. - Small (< 5 mm) esophageal varices. - Esophagogastric landmarks identified. - Normal proximal esophagus, mid esophagus and gastroesophageal junction.  Procedure aborted due to large left inguinal hernia, advanced to transverse colon only The perianal and digital rectal examinations were normal. Pertinent negatives include normal sphincter tone and no palpable rectal lesions. Two sessile polyps were found in the descending colon and transverse colon. The polyps were 3 to 5 mm in size. These polyps  were removed with a cold snare. Resection and retrieval were complete. Estimated blood loss: none. Non-bleeding external and internal hemorrhoids were found during retroflexion. The hemorrhoids were large.  DIAGNOSIS:  A. STOMACH; COLD  BIOPSY:  - MODERATE CHRONIC ACTIVE GASTRITIS.  - ANTRAL MUCOSA WITH FOCAL INTESTINAL METAPLASIA.  - SIDEROSIS.  - SEE COMMENT.  - NEGATIVE FOR H.PYLORI, DYSPLASIA AND MALIGNANCY.   B.  STOMACH POLYPS; COLD BIOPSY:  - INFLAMED POLYPOID FRAGMENT OF MUCOSA WITH REACTIVE FOVEOLAR  HYPERPLASIA.  - NEGATIVE FOR H.PYLORI, CMV, DYSPLASIA AND MALIGNANCY.   C.  COLON POLYP X3, DESCENDING; COLD SNARE:  - TUBULAR ADENOMA (MULTIPLE FRAGMENTS).  - NEGATIVE FOR HIGH-GRADE DYSPLASIA AND MALIGNANCY.   Colonoscopy 09/18/2021 - Five 3 to 5 mm polyps in the descending colon, in the ascending colon and in the cecum, removed with a cold snare. Resected and retrieved. - Non-bleeding external and internal hemorrhoids. - Diverticulosis in the sigmoid colon and in the right colon. DIAGNOSIS:  A. COLON POLYPS X2, CECUM; COLD SNARE:  - MULTIPLE FRAGMENTS OF TUBULAR ADENOMAS.  - NEGATIVE FOR HIGH-GRADE DYSPLASIA AND MALIGNANCY.   B. COLON POLYP, ASCENDING; COLD SNARE:  - TUBULAR ADENOMA.  - NEGATIVE FOR HIGH-GRADE DYSPLASIA AND MALIGNANCY.   C. COLON POLYPS X2, DESCENDING; COLD SNARE:  - TUBULAR ADENOMA.  - NEGATIVE FOR HIGH-GRADE DYSPLASIA AND MALIGNANCY.   He denies family history of GI malignancy, liver cancer  Past Medical History:  Diagnosis Date   Alcohol abuse    Ascites    Ascites due to alcoholic cirrhosis (HCC)    CHF (congestive heart failure) ([place])    Cirrhosis ([place])    Diabetes mellitus without complication ([place])    History of congestive heart failure 07/12/2020   History of methicillin resistant staphylococcus aureus (MRSA) 2016   Pneumonia 2021   Portal hypertension (HCC)    Pulmonary embolus (HCC)    SBP (spontaneous bacterial peritonitis) ([place]) 06/07/2020   Spontaneous bacterial peritonitis ([place])     Past Surgical History:  Procedure Laterality Date   CATARACT EXTRACTION     COLONOSCOPY WITH PROPOFOL N/A 01/03/2021   Procedure: COLONOSCOPY WITH PROPOFOL;  Surgeon: Lin Landsman, MD;  Location: [facility] ENDOSCOPY;  Service: Gastroenterology;  Laterality: N/A;   COLONOSCOPY WITH PROPOFOL N/A 09/18/2021   Procedure: COLONOSCOPY WITH PROPOFOL;  Surgeon: Lin Landsman, MD;  Location: [facility] ENDOSCOPY;  Service: Gastroenterology;  Laterality: N/A;   ESOPHAGOGASTRODUODENOSCOPY (EGD) WITH PROPOFOL N/A 01/03/2021   Procedure: ESOPHAGOGASTRODUODENOSCOPY (EGD) WITH PROPOFOL;  Surgeon: Lin Landsman, MD;  Location: [facility] ENDOSCOPY;  Service: Gastroenterology;  Laterality: N/A;   EYE SURGERY     INGUINAL HERNIA REPAIR Left 04/11/2021   Procedure: HERNIA REPAIR INGUINAL ADULT, open, WITH DIAGNOSTIC LAPAROSCOPY, AND ROBOT ASSISTED;  Surgeon: Jules Husbands, MD;  Location: [facility] ORS;  Service: General;  Laterality: Left;  provider requesting 2 hours / 120 minutes for procedure.   PARACENTESIS  2021   TESTICULAR EXPLORATION Left 04/11/2021   Procedure: TESTICULAR EXPLORATION AND REMOVAL OF LEFT TESTICLE;  Surgeon: Billey Co, MD;  Location: [facility] ORS;  Service: Urology;  Laterality: Left;   UMBILICAL HERNIA REPAIR N/A 04/04/2021   Procedure: HERNIA REPAIR UMBILICAL ADULT, open;  Surgeon: Jules Husbands, MD;  Location: [facility] ORS;  Service: General;  Laterality: N/A;   XI ROBOTIC ASSISTED INGUINAL HERNIA REPAIR WITH MESH Left 04/04/2021   Procedure: XI ROBOTIC ASSISTED INGUINAL HERNIA REPAIR WITH MESH;  Surgeon: Jules Husbands, MD;  Location: [facility] ORS;  Service: General;  Laterality: Left;    Current Outpatient Medications:    blood glucose meter kit and supplies KIT, Dispense based on patient and insurance preference. Use up to four times daily as directed. (FOR ICD-9 250.00, 250.01)., Disp: 1 each, Rfl: 0   folic acid (FOLVITE) 1 MG tablet, Take 1 tablet (1 mg total) by mouth daily., Disp: 90 tablet, Rfl: 1   furosemide (LASIX) 40 MG tablet, TAKE ONE TABLET BY MOUTH EVERY DAY (Patient taking differently: Take 20 mg by mouth daily.), Disp: 30 tablet, Rfl: 5   Omega-3 Fatty  Acids (FISH OIL) 1000 MG CAPS, Take 2 capsules by mouth in the morning and at bedtime., Disp: , Rfl:    omeprazole (PRILOSEC) 20 MG capsule, Take 1 capsule (20 mg total) by mouth daily., Disp: 90 capsule, Rfl: 3   sacubitril-valsartan (ENTRESTO) 24-26 MG, Take 1 tablet by mouth 2 (two) times daily., Disp: 60 tablet, Rfl: 5   spironolactone (ALDACTONE) 100 MG tablet, Take 1 tablet (100 mg total) by mouth 2 (two) times daily., Disp: 60 tablet, Rfl: 5   thiamine (VITAMIN B-1) 100 MG tablet, TAKE ONE TABLET BY MOUTH EVERY DAY, Disp: 90 tablet, Rfl: 1  No family history on file.   Social History   Tobacco Use   Smoking status: Every Day    Packs/day: 0.25    Types: Cigarettes   Smokeless tobacco: Never  Vaping Use   Vaping Use: Never used  Substance Use Topics   Alcohol use: Not Currently   Drug use: Never    Allergies as of 03/06/2022   (No Known Allergies)    Review of Systems:    All systems reviewed and negative except where noted in HPI.   Physical Exam:  BP 107/69 (BP Location: Left Arm, Patient Position: Sitting, Cuff Size: Normal)    Pulse 72    Temp 98 F (36.7 C) (Oral)    Ht 5' 7"  (1.702 m)    Wt 216 lb 2 oz (98 kg)    BMI 33.85 kg/m  No LMP for male patient.  General:   Alert,  Well-developed, well-nourished, pleasant and cooperative in NAD Head:  Normocephalic and atraumatic. Eyes:  Sclera clear, no icterus.   Conjunctiva pink.  Congested right eye Ears:  Normal auditory acuity. Nose:  No deformity, discharge, or lesions. Mouth:  No deformity or lesions,oropharynx pink & moist. Neck:  Supple; no masses or thyromegaly. Lungs:  Respirations even and unlabored.  Clear throughout to auscultation.   No wheezes, crackles, or rhonchi. No acute distress. Heart:  Regular rate and rhythm; no murmurs, clicks, rubs, or gallops. Abdomen:  Normal bowel sounds. Soft, non-tender and non-distended without masses, hepatosplenomegaly, large left inguinal hernia noted.  No guarding or  rebound tenderness.   Rectal: Not performed Msk:  Symmetrical without gross deformities. Good, equal movement & strength bilaterally. Pulses:  Normal pulses noted. Extremities:  No clubbing or edema.  No cyanosis. Neurologic:  Alert and oriented x3;  grossly normal neurologically. Skin:  Intact without significant lesions or rashes. No jaundice. Psych:  Alert and cooperative. Normal mood and affect.  Imaging Studies: Reviewed  Assessment and Plan:   Yuri Fana is a 61 y.o. Hispanic male with diabetes, decompensated alcoholic cirrhosis with ascites, history of SBP, pneumonia and pulmonary embolism s/p 6 months of anticoagulation on Xarelto is seen in consultation for follow-up of cirrhosis  Alcoholic cirrhosis of liver, child Pugh class B, low meld Viral hepatitis panel negative, s/p Twinrix vaccine x3 doses Iron deficiency  anemia has resolved, s/p iron replacement therapy Volume overload: Currently euvolemic.  He had history of ascites s/p therapeutic paracentesis in 6/21, consistent with portal hypertension Advised patient to decrease spironolactone to 100 mg daily and Lasix 20 mg daily Continue low-sodium diet No evidence of esophageal or gastric varices HCC screening: AFP levels normal and CT did not reveal liver lesions in 6/21 and 4/22 Recommend right upper quadrant ultrasound PSE: None Continue to remain abstinent from alcohol use Strongly advised patient to follow healthy diet, healthy eating habits and exercise, not to gain weight  Moderate chronic active gastritis H. pylori IgG was positive, s/p treatment for H. pylori infection with triple therapy in 02/2021   Follow up in 6 months   [name], MD

## 2022-03-12 ENCOUNTER — Other Ambulatory Visit: Payer: Self-pay

## 2022-03-12 ENCOUNTER — Ambulatory Visit
Admission: RE | Admit: 2022-03-12 | Discharge: 2022-03-12 | Disposition: A | Discharge status: Home / Self Care | Payer: Medicaid Other | Source: Ambulatory Visit | Attending: Gastroenterology | Admitting: Gastroenterology

## 2022-03-12 DIAGNOSIS — K7031 Alcoholic cirrhosis of liver with ascites: Secondary | ICD-10-CM | POA: Diagnosis present

## 2022-03-14 ENCOUNTER — Telehealth: Payer: Self-pay

## 2022-03-14 NOTE — Telephone Encounter (Signed)
Called patient and informed  the lady that answer the phone to tell him to give [place] a call back  ?

## 2022-03-14 NOTE — Telephone Encounter (Signed)
-----   Message from Lin Landsman, MD sent at 03/14/2022  9:49 AM EDT ----- ?Ultrasound shows cirrhosis of liver only.  No liver lesions.  Follow-up ultrasound in 6 months ? ?RV ?

## 2022-03-17 NOTE — Telephone Encounter (Signed)
Called and left a message for call back  

## 2022-03-17 NOTE — Telephone Encounter (Signed)
Called and left a message for call back with lady that answered the phone  ?

## 2022-03-18 NOTE — Telephone Encounter (Signed)
Tried to call patient again and left a message with person answered the phone and left a message for call back. Mailed letter since unable to reach patient  ?

## 2022-03-24 NOTE — Telephone Encounter (Signed)
Patient called back and verbalized understanding of results  

## 2022-04-06 ENCOUNTER — Other Ambulatory Visit: Payer: Self-pay | Admitting: Gerontology

## 2022-04-06 ENCOUNTER — Other Ambulatory Visit: Payer: Self-pay | Admitting: Nurse Practitioner

## 2022-04-06 DIAGNOSIS — F1011 Alcohol abuse, in remission: Secondary | ICD-10-CM

## 2022-04-07 ENCOUNTER — Telehealth: Payer: Self-pay | Admitting: Nurse Practitioner

## 2022-04-07 ENCOUNTER — Telehealth: Payer: Self-pay | Admitting: Gastroenterology

## 2022-04-07 DIAGNOSIS — F1011 Alcohol abuse, in remission: Secondary | ICD-10-CM

## 2022-04-07 MED ORDER — THIAMINE MONONITRATE 100 MG PO TABS: ORAL_TABLET | Freq: Every day | ORAL | 1 refills | Status: AC

## 2022-04-07 NOTE — Telephone Encounter (Signed)
Routing to provider. The only requested medication close to being due is Thiamine.  ?

## 2022-04-07 NOTE — Telephone Encounter (Signed)
Requested medication (s) are due for refill today: requested today ? ?Requested medication (s) are on the active medication list: yes ? ?Last refill:  10/21/21-10/21/22 #90 1 refill ? ?Future visit scheduled: yes in 3 months ? ?Notes to clinic:  medication not assigned to a protocol. Do you want to refill Rx? ? ? ?  ?Requested Prescriptions  ?Pending Prescriptions Disp Refills  ? thiamine (VITAMIN B-1) 100 MG tablet [Pharmacy Med Name: VITAMIN B1 (THIAMINE) '100MG'$  TABS] 90 tablet 1  ?  Sig: TAKE 1 TABLET BY MOUTH EVERY DAY  ?  ? Off-Protocol Failed - 04/06/2022  3:45 PM  ?  ?  Failed - Medication not assigned to a protocol, review manually.  ?  ?  Passed - Valid encounter within last 12 months  ?  Recent Outpatient Visits   ? ?      ? 2 months ago Annual physical exam  ? [place], NP  ? 5 months ago Alcoholic cirrhosis, unspecified whether ascites present [facility])  ? [facility] Jon Billings, NP  ? ?  ?  ?Future Appointments   ? ?        ? In 3 months Jon Billings, NP [facility], [place]  ? In 5 months Vanga, Tally Due, MD [place]  ? ?  ? ?  ?  ?  ? ?

## 2022-04-07 NOTE — Telephone Encounter (Signed)
Medication: folic acid (FOLVITE) 1 MG tablet [[id number],  thiamine (VITAMIN B-1) 100 MG tablet [[id number]] , sacubitril-valsartan (ENTRESTO) 24-26 MG [[id number]]  ? ?Has the patient contacted their pharmacy? YES  ?(Agent: If no, request that the patient contact the pharmacy for the refill. If patient does not wish to contact the pharmacy document the reason why and proceed with request.) ?(Agent: If yes, when and what did the pharmacy advise?) ? ?Preferred Pharmacy (with phone number or street name): [id number] - [place], [place] [address] [place] ?[place] [address] ?Phone: [phone] Fax: [phone] ?Hours: Not open 24 hours ? ? ?Has the patient been seen for an appointment in the last year OR does the patient have an upcoming appointment? YES 07/23/22 ? ?Agent: Please be advised that RX refills may take up to 3 business days. We ask that you follow-up with your pharmacy. ?

## 2022-04-08 NOTE — Telephone Encounter (Signed)
Requested medication (s) are due for refill today:   Not sure ? ?Requested medication (s) are on the active medication list:   Yes ? ?Future visit scheduled:   Yes ? ? ?Last ordered: 04/07/2022 #90, 1 refill ? ?Returned because there is not a protocol assigned to this medication and a duplicate refill request came in.  ? ?Requested Prescriptions  ?Pending Prescriptions Disp Refills  ? thiamine (VITAMIN B-1) 100 MG tablet [Pharmacy Med Name: VITAMIN B1 (THIAMINE) '100MG'$  TABS] 90 tablet 1  ?  Sig: TAKE 1 TABLET BY MOUTH EVERY DAY  ?  ? Off-Protocol Failed - 04/07/2022 11:53 AM  ?  ?  Failed - Medication not assigned to a protocol, review manually.  ?  ?  Passed - Valid encounter within last 12 months  ?  Recent Outpatient Visits   ? ?      ? 2 months ago Annual physical exam  ? [place], NP  ? 5 months ago Alcoholic cirrhosis, unspecified whether ascites present [facility])  ? [facility] Jon Billings, NP  ? ?  ?  ?Future Appointments   ? ?        ? In 3 months Jon Billings, NP [facility], [place]  ? In 5 months Vanga, Tally Due, MD [place]  ? ?  ? ?  ?  ?  ? ?

## 2022-04-09 ENCOUNTER — Other Ambulatory Visit: Payer: Self-pay | Admitting: Family

## 2022-04-09 MED ORDER — ENTRESTO 24-26 MG PO TABS: 1.0000 | ORAL_TABLET | Freq: Two times a day (BID) | ORAL | 6 refills | Status: DC

## 2022-04-09 NOTE — Telephone Encounter (Signed)
That medicine is written by his cardiologist.  ?

## 2022-04-09 NOTE — Telephone Encounter (Signed)
Patient wanted PCP to know he has 1 week left on the sacubitril-valsartan (ENTRESTO) 57-26 MG and folic acid (FOLVITE) 1 MG tablet ? ? ?[id number], [place] [address] [place] Phone:  [phone]  ?Fax:  [phone]  ?  ? ?

## 2022-04-11 ENCOUNTER — Telehealth: Payer: Self-pay | Admitting: Nurse Practitioner

## 2022-04-11 NOTE — Telephone Encounter (Signed)
Pt is calling to check on the status on vitamin B1 & Folic acid. Please advise  ?

## 2022-04-11 NOTE — Telephone Encounter (Addendum)
Left message to call office- regarding Rx request ?Rx : Vitamin B was sent to pharmacy 04/07/22- [facility] ?Folic Acid has RF- but is at [facility] ?

## 2022-04-14 MED ORDER — FOLIC ACID 1 MG PO TABS: 1.0000 mg | ORAL_TABLET | Freq: Every day | ORAL | 1 refills | Status: DC

## 2022-04-14 NOTE — Telephone Encounter (Signed)
Patient was notified that his medication was sent over to his local pharmacy. Patient verbalized understanding.  ?

## 2022-04-14 NOTE — Telephone Encounter (Signed)
Pt is calling back to follow up on folic acid (FOLVITE) 1 MG tablet. Per pharmacy a refill request has not been received.  ? ?Please advise.  ?

## 2022-04-14 NOTE — Addendum Note (Signed)
Addended by: Jon Billings on: 04/14/2022 09:21 AM ? ? Modules accepted: Orders ? ?

## 2022-04-14 NOTE — Telephone Encounter (Signed)
Attempted to call patient- left message to call office regarding medications ?

## 2022-04-30 ENCOUNTER — Other Ambulatory Visit: Payer: Self-pay | Admitting: Family

## 2022-04-30 MED ORDER — SPIRONOLACTONE 100 MG PO TABS: 100.0000 mg | ORAL_TABLET | Freq: Two times a day (BID) | ORAL | 5 refills | Status: DC

## 2022-06-11 ENCOUNTER — Ambulatory Visit: Payer: Medicaid Other | Attending: Family | Admitting: Family

## 2022-06-11 ENCOUNTER — Encounter: Payer: Self-pay | Admitting: Family

## 2022-06-11 VITALS — BP 98/68 | HR 90 | Resp 16 | Ht 67.0 in | Wt 208.2 lb

## 2022-06-11 DIAGNOSIS — I5032 Chronic diastolic (congestive) heart failure: Secondary | ICD-10-CM | POA: Insufficient documentation

## 2022-06-11 DIAGNOSIS — E1136 Type 2 diabetes mellitus with diabetic cataract: Secondary | ICD-10-CM | POA: Insufficient documentation

## 2022-06-11 DIAGNOSIS — E119 Type 2 diabetes mellitus without complications: Secondary | ICD-10-CM | POA: Diagnosis not present

## 2022-06-11 DIAGNOSIS — K7030 Alcoholic cirrhosis of liver without ascites: Secondary | ICD-10-CM | POA: Insufficient documentation

## 2022-06-11 DIAGNOSIS — K7031 Alcoholic cirrhosis of liver with ascites: Secondary | ICD-10-CM | POA: Diagnosis not present

## 2022-06-11 NOTE — Progress Notes (Signed)
[place] - PHARMACIST COUNSELING NOTE  Guideline-Directed Medical Therapy/Evidence Based Medicine  ACE/ARB/ARNI: Sacubitril-valsartan 24-26 mg twice daily Beta Blocker: None Aldosterone Antagonist: Spironolactone 100 mg BID (patient taking daily) Diuretic: Furosemide 40 mg daily SGLT2i: None  Adherence Assessment  Do you ever forget to take your medication? [] Yes [x] No  Do you ever skip doses due to side effects? [] Yes [x] No  Do you have trouble affording your medicines? [] Yes [x] No  Are you ever unable to pick up your medication due to transportation difficulties? [] Yes [x] No  Do you ever stop taking your medications because you don't believe they are helping? [] Yes [x] No  Do you check your weight daily? [x] Yes [] No   Adherence strategy: associates with time of day (AM and PM)  Barriers to obtaining medications: none  Vital signs: HR 90, BP 98/68, weight (pounds) 208 lb ECHO: Date 2021, EF >55%, notes: moderate left ventricular hypertrophy      Latest Ref Rng & Units 01/23/2022    1:23 PM 10/21/2021   10:15 AM 08/29/2021    2:32 PM  BMP  Glucose 70 - 99 mg/dL 116  101  89   BUN 6 - 24 mg/dL 20  19  30    Creatinine 0.76 - 1.27 mg/dL 0.99  1.00  1.03   BUN/Creat Ratio 9 - 20 20  19  29    Sodium 134 - 144 mmol/L 138  138  137   Potassium 3.5 - 5.2 mmol/L 4.8  4.6  4.9   Chloride 96 - 106 mmol/L 99  101  101   CO2 20 - 29 mmol/L 20  24  19    Calcium 8.7 - 10.2 mg/dL 9.7  9.4  9.4     Past Medical History:  Diagnosis Date   Alcohol abuse    Ascites    Ascites due to alcoholic cirrhosis (HCC)    CHF (congestive heart failure) (HCC)    Cirrhosis (HCC)    Diabetes mellitus without complication ([place])    History of congestive heart failure 07/12/2020   History of methicillin resistant staphylococcus aureus (MRSA) 2016   Pneumonia 2021   Portal hypertension (HCC)    Pulmonary embolus (HCC)    SBP (spontaneous bacterial  peritonitis) ([place]) 06/07/2020   Spontaneous bacterial peritonitis [facility])     ASSESSMENT 61 year old male who presents to the HF clinic for management of HFpEF. PMH relevant to alcohol abuse, ascites d/t alcoholic cirrhosis, T3SK. HF/cirrhosis medications include Entresto 24-26 mg BID, spironolactone 100 mg daily, and furosemide 40 mg daily. Blood pressure 98/68 today in clinic. Checks bp at home noting SBPs in 100-110s.   PLAN Reconciled medications Recommend continuing current CHF management given soft blood pressures  Time spent: 15 minutes   Wynelle Cleveland, PharmD Pharmacy Resident  06/11/2022 1:28 PM  Current Outpatient Medications:    blood glucose meter kit and supplies KIT, Dispense based on patient and insurance preference. Use up to four times daily as directed. (FOR ICD-9 250.00, 250.01)., Disp: 1 each, Rfl: 0   folic acid (FOLVITE) 1 MG tablet, Take 1 tablet (1 mg total) by mouth daily., Disp: 90 tablet, Rfl: 1   furosemide (LASIX) 40 MG tablet, TAKE ONE TABLET BY MOUTH EVERY DAY (Patient taking differently: Take 20 mg by mouth daily.), Disp: 30 tablet, Rfl: 5   Omega-3 Fatty Acids (FISH OIL) 1000 MG CAPS, Take 2 capsules by mouth in the morning and at bedtime., Disp: , Rfl:  omeprazole (PRILOSEC) 20 MG capsule, Take 1 capsule (20 mg total) by mouth daily., Disp: 90 capsule, Rfl: 3   sacubitril-valsartan (ENTRESTO) 24-26 MG, Take 1 tablet by mouth 2 (two) times daily., Disp: 60 tablet, Rfl: 6   spironolactone (ALDACTONE) 100 MG tablet, Take 1 tablet (100 mg total) by mouth 2 (two) times daily. (Patient taking differently: Take 100 mg by mouth 2 (two) times daily. Taking 1 tablet once daily), Disp: 60 tablet, Rfl: 5   thiamine (VITAMIN B-1) 100 MG tablet, TAKE ONE TABLET BY MOUTH EVERY DAY, Disp: 90 tablet, Rfl: 1   COUNSELING POINTS/CLINICAL PEARLS   DRUGS TO CAUTION IN HEART FAILURE  Drug or Class Mechanism  Analgesics NSAIDs COX-2 inhibitors Glucocorticoids   Sodium and water retention, increased systemic vascular resistance, decreased response to diuretics   Diabetes Medications Metformin Thiazolidinediones Rosiglitazone (Avandia) Pioglitazone (Actos) DPP4 Inhibitors Saxagliptin (Onglyza) Sitagliptin (Januvia)   Lactic acidosis Possible calcium channel blockade   Unknown  Antiarrhythmics Class I  Flecainide Disopyramide Class III Sotalol Other Dronedarone  Negative inotrope, proarrhythmic   Proarrhythmic, beta blockade  Negative inotrope  Antihypertensives Alpha Blockers Doxazosin Calcium Channel Blockers Diltiazem Verapamil Nifedipine Central Alpha Adrenergics Moxonidine Peripheral Vasodilators Minoxidil  Increases renin and aldosterone  Negative inotrope    Possible sympathetic withdrawal  Unknown  Anti-infective Itraconazole Amphotericin B  Negative inotrope Unknown  Hematologic Anagrelide Cilostazol   Possible inhibition of PD IV Inhibition of PD III causing arrhythmias  Neurologic/Psychiatric Stimulants Anti-Seizure Drugs Carbamazepine Pregabalin Antidepressants Tricyclics Citalopram Parkinsons Bromocriptine Pergolide Pramipexole Antipsychotics Clozapine Antimigraine Ergotamine Methysergide Appetite suppressants Bipolar Lithium  Peripheral alpha and beta agonist activity  Negative inotrope and chronotrope Calcium channel blockade  Negative inotrope, proarrhythmic Dose-dependent QT prolongation  Excessive serotonin activity/valvular damage Excessive serotonin activity/valvular damage Unknown  IgE mediated hypersensitivy, calcium channel blockade  Excessive serotonin activity/valvular damage Excessive serotonin activity/valvular damage Valvular damage  Direct myofibrillar degeneration, adrenergic stimulation  Antimalarials Chloroquine Hydroxychloroquine Intracellular inhibition of lysosomal enzymes  Urologic Agents Alpha  Blockers Doxazosin Prazosin Tamsulosin Terazosin  Increased renin and aldosterone  Adapted from Page Carleene Overlie, et al. "Drugs That May Cause or Exacerbate Heart Failure: A Scientific Statement from the American Heart  Association." Circulation 2016; 134:e32-e69. DOI: 10.1161/CIR.0000000000000426   MEDICATION ADHERENCES TIPS AND STRATEGIES Taking medication as prescribed improves patient outcomes in heart failure (reduces hospitalizations, improves symptoms, increases survival) Side effects of medications can be managed by decreasing doses, switching agents, stopping drugs, or adding additional therapy. Please let someone in the [facility] know if you have having bothersome side effects so we can modify your regimen. Do not alter your medication regimen without talking to [place].  Medication reminders can help patients remember to take drugs on time. If you are missing or forgetting doses you can try linking behaviors, using pill boxes, or an electronic reminder like an alarm on your phone or an app. Some people can also get automated phone calls as medication reminders.

## 2022-06-11 NOTE — Progress Notes (Signed)
Patient ID: Collin Byrd, male    DOB: August 11, 1961, 61 y.o.   MRN: [id number]  HPI  Collin Byrd is a 61 y/o male with a history of DM, cataract, PE, cirrhosis, previous tobacco/ alcohol use and chronic heart failure.   Echo report from 06/05/20 reviewed and showed an EF of >55% along with moderate LVH.   Has not been admitted or been in the ED in the last 6 months.   He presents today for a follow-up visit with a chief complaint of difficulty sleeping. Describes this as chronic in nature having been present for several years. Occurs on an intermittent basis. Has no other symptoms and specifically denies any dizziness, abdominal distention, palpitations, pedal edema, chest pain, shortness of breath, cough, fatigue or weight gain.   Overall is feeling well.   Past Medical History:  Diagnosis Date   Alcohol abuse    Ascites    Ascites due to alcoholic cirrhosis (HCC)    CHF (congestive heart failure) ([place])    Cirrhosis ([place])    Diabetes mellitus without complication ([place])    History of congestive heart failure 07/12/2020   History of methicillin resistant staphylococcus aureus (MRSA) 2016   Pneumonia 2021   Portal hypertension (HCC)    Pulmonary embolus (HCC)    SBP (spontaneous bacterial peritonitis) ([place]) 06/07/2020   Spontaneous bacterial peritonitis ([place])    Past Surgical History:  Procedure Laterality Date   CATARACT EXTRACTION     COLONOSCOPY WITH PROPOFOL N/A 01/03/2021   Procedure: COLONOSCOPY WITH PROPOFOL;  Surgeon: Lin Landsman, MD;  Location: [facility] ENDOSCOPY;  Service: Gastroenterology;  Laterality: N/A;   COLONOSCOPY WITH PROPOFOL N/A 09/18/2021   Procedure: COLONOSCOPY WITH PROPOFOL;  Surgeon: Lin Landsman, MD;  Location: [facility] ENDOSCOPY;  Service: Gastroenterology;  Laterality: N/A;   ESOPHAGOGASTRODUODENOSCOPY (EGD) WITH PROPOFOL N/A 01/03/2021   Procedure: ESOPHAGOGASTRODUODENOSCOPY (EGD) WITH PROPOFOL;  Surgeon: Lin Landsman, MD;  Location: [facility]  ENDOSCOPY;  Service: Gastroenterology;  Laterality: N/A;   EYE SURGERY     INGUINAL HERNIA REPAIR Left 04/11/2021   Procedure: HERNIA REPAIR INGUINAL ADULT, open, WITH DIAGNOSTIC LAPAROSCOPY, AND ROBOT ASSISTED;  Surgeon: Jules Husbands, MD;  Location: [facility] ORS;  Service: General;  Laterality: Left;  provider requesting 2 hours / 120 minutes for procedure.   PARACENTESIS  2021   TESTICULAR EXPLORATION Left 04/11/2021   Procedure: TESTICULAR EXPLORATION AND REMOVAL OF LEFT TESTICLE;  Surgeon: Billey Co, MD;  Location: [facility] ORS;  Service: Urology;  Laterality: Left;   UMBILICAL HERNIA REPAIR N/A 04/04/2021   Procedure: HERNIA REPAIR UMBILICAL ADULT, open;  Surgeon: Jules Husbands, MD;  Location: [facility] ORS;  Service: General;  Laterality: N/A;   XI ROBOTIC ASSISTED INGUINAL HERNIA REPAIR WITH MESH Left 04/04/2021   Procedure: XI ROBOTIC ASSISTED INGUINAL HERNIA REPAIR WITH MESH;  Surgeon: Jules Husbands, MD;  Location: [facility] ORS;  Service: General;  Laterality: Left;   No family history on file. Social History   Tobacco Use   Smoking status: Every Day    Packs/day: 0.25    Types: Cigarettes   Smokeless tobacco: Never  Substance Use Topics   Alcohol use: Not Currently   No Known Allergies  Prior to Admission medications   Medication Sig Start Date End Date Taking? Authorizing Provider  blood glucose meter kit and supplies KIT Dispense based on patient and insurance preference. Use up to four times daily as directed. (FOR ICD-9 250.00, 250.01). 07/12/20  Yes Iloabachie, Chioma E, NP  folic acid (FOLVITE) 1 MG tablet Take 1 tablet (1 mg total) by mouth daily. 04/14/22  Yes Jon Billings, NP  furosemide (LASIX) 40 MG tablet TAKE ONE TABLET BY MOUTH EVERY DAY Patient taking differently: Take 20 mg by mouth daily. 10/14/21 10/14/22 Yes Brynja Marker, Aura Fey, FNP  Omega-3 Fatty Acids (FISH OIL) 1000 MG CAPS Take 2 capsules by mouth in the morning and at bedtime.   Yes [provider]   omeprazole (PRILOSEC) 20 MG capsule Take 1 capsule (20 mg total) by mouth daily. 10/29/21  Yes Jon Billings, NP  sacubitril-valsartan (ENTRESTO) 24-26 MG Take 1 tablet by mouth 2 (two) times daily. 04/09/22  Yes Darylene Price A, FNP  spironolactone (ALDACTONE) 100 MG tablet Take 1 tablet (100 mg total) by mouth 2 (two) times daily. Patient taking differently:  Taking 1 tablet once daily 04/30/22  Yes Rayn Shorb A, FNP  thiamine (VITAMIN B-1) 100 MG tablet TAKE ONE TABLET BY MOUTH EVERY DAY 04/07/22 04/07/23 Yes Cannady, Henrine Screws T, NP    Review of Systems  Constitutional:  Negative for activity change, appetite change and fatigue.  HENT:  Negative for congestion, postnasal drip and sore throat.   Respiratory:  Negative for cough, chest tightness and shortness of breath.   Cardiovascular:  Negative for chest pain, palpitations and leg swelling.  Gastrointestinal:  Negative for abdominal distention and abdominal pain.  Endocrine: Negative.   Genitourinary: Negative.   Musculoskeletal:  Negative for back pain, gait problem and neck pain.  Skin: Negative.   Allergic/Immunologic: Negative.   Neurological:  Negative for dizziness and light-headedness.  Hematological:  Negative for adenopathy. Does not bruise/bleed easily.  Psychiatric/Behavioral:  Positive for sleep disturbance (sleeping on 2 pillows). Negative for dysphoric mood. The patient is not nervous/anxious.    Vitals:   06/11/22 1150  BP: 98/68  Pulse: 90  Resp: 16  SpO2: 98%  Weight: 208 lb 4 oz (94.5 kg)  Height: _0  (1.702 m)   Wt Readings from Last 3 Encounters:  06/11/22 208 lb 4 oz (94.5 kg)  03/06/22 216 lb 2 oz (98 kg)  01/23/22 213 lb 12.8 oz (97 kg)   Lab Results  Component Value Date   CREATININE 0.99 01/23/2022   CREATININE 1.00 10/21/2021   CREATININE 1.03 08/29/2021    Physical Exam Vitals and nursing note reviewed.  Constitutional:      Appearance: Normal appearance.  HENT:     Head: Normocephalic  and atraumatic.  Cardiovascular:     Rate and Rhythm: Normal rate and regular rhythm.  Pulmonary:     Effort: Pulmonary effort is normal. No respiratory distress.     Breath sounds: No wheezing or rales.  Abdominal:     General: There is no distension.     Palpations: Abdomen is soft.     Tenderness: There is no abdominal tenderness.  Musculoskeletal:        General: No tenderness.     Cervical back: Normal range of motion.     Right lower leg: No edema.     Left lower leg: No edema.  Skin:    General: Skin is warm and dry.  Neurological:     General: No focal deficit present.     Mental Status: He is alert and oriented to person, place, and time.  Psychiatric:        Mood and Affect: Mood normal.        Behavior: Behavior normal.        Thought  Content: Thought content normal.    Assessment & Plan:  1: Chronic heart failure with preserved ejection fraction with structural changes (LVH)- - NYHA class I - euvolemic today - weighing daily; reminded to call for an overnight weight gain of >2 pounds or a weekly weight gain of >5 pounds - weight down 6 pounds from last visit here 6 months ago - not adding salt to his food  - on GDMT of entresto - consider adding jardiance in the future but will get echo updated first - echo has been scheduled for 07/17/22 - BMP 01/23/22 reviewed and showed sodium 138, potassium 4.8, creatinine 0.99 and GFR 88 - BNP 06/05/20 was 155.9 - PharmD reconciled medications with the patient  2: DM- - saw PCP Mathis Dad) 10/21/21 - glucose at home today was 105 - A1c 01/23/22 was 0.4%  3: Alcoholic cirrhosis- - says that he hasn't had any alcohol since June 2021. - saw GI (Vanga) 03/06/22 - abdominal ultrasound done 03/12/22   Medication bottles were reviewed.   Return in 1 month after echo, sooner if needed.

## 2022-06-11 NOTE — Patient Instructions (Signed)
Continue weighing daily and call for an overnight weight gain of 3 pounds or more or a weekly weight gain of more than 5 pounds  If you have voicemail, please make sure your mailbox is cleaned out so that we may leave a message and please make sure to listen to any voicemails.    If you receive a satisfaction survey regarding the [facility], please take the time to fill it out. This way we can continue to provide excellent care and make any changes that need to be made.     

## 2022-07-11 ENCOUNTER — Other Ambulatory Visit: Payer: Self-pay | Admitting: Nurse Practitioner

## 2022-07-11 DIAGNOSIS — F1011 Alcohol abuse, in remission: Secondary | ICD-10-CM

## 2022-07-11 NOTE — Telephone Encounter (Signed)
Requested Prescriptions  Pending Prescriptions Disp Refills  . folic acid (FOLVITE) 1 MG tablet [Pharmacy Med Name: FOLIC ACID 1 MG TABLET] 90 tablet 0    Sig: TAKE 1 TABLET BY MOUTH EVERY DAY     Endocrinology:  Vitamins Passed - 07/11/2022  2:19 AM      Passed - Valid encounter within last 12 months    Recent Outpatient Visits          5 months ago Annual physical exam   [place], NP   8 months ago Alcoholic cirrhosis, unspecified whether ascites present [facility])   [facility] Jon Billings, NP      Future Appointments            In 1 week Jon Billings, NP [facility] [place]   In 1 month Vanga, Tally Due, MD [place]

## 2022-07-17 ENCOUNTER — Encounter: Payer: Self-pay | Admitting: Family

## 2022-07-17 ENCOUNTER — Ambulatory Visit (HOSPITAL_BASED_OUTPATIENT_CLINIC_OR_DEPARTMENT_OTHER): Payer: Medicaid Other | Admitting: Family

## 2022-07-17 ENCOUNTER — Ambulatory Visit
Admission: RE | Admit: 2022-07-17 | Discharge: 2022-07-17 | Disposition: A | Discharge status: Home / Self Care | Payer: Medicaid Other | Source: Ambulatory Visit | Attending: Family | Admitting: Family

## 2022-07-17 VITALS — BP 94/66 | HR 67 | Resp 16 | Ht 67.0 in | Wt 213.0 lb

## 2022-07-17 DIAGNOSIS — K7030 Alcoholic cirrhosis of liver without ascites: Secondary | ICD-10-CM | POA: Diagnosis not present

## 2022-07-17 DIAGNOSIS — I5032 Chronic diastolic (congestive) heart failure: Secondary | ICD-10-CM | POA: Insufficient documentation

## 2022-07-17 DIAGNOSIS — Z87891 Personal history of nicotine dependence: Secondary | ICD-10-CM | POA: Insufficient documentation

## 2022-07-17 DIAGNOSIS — E119 Type 2 diabetes mellitus without complications: Secondary | ICD-10-CM | POA: Diagnosis not present

## 2022-07-17 DIAGNOSIS — E1136 Type 2 diabetes mellitus with diabetic cataract: Secondary | ICD-10-CM | POA: Diagnosis not present

## 2022-07-17 DIAGNOSIS — K7031 Alcoholic cirrhosis of liver with ascites: Secondary | ICD-10-CM | POA: Diagnosis not present

## 2022-07-17 NOTE — Progress Notes (Signed)
Patient ID: Collin Byrd, male    DOB: 18-Oct-1961, 61 y.o.   MRN: [id number]  HPI  Collin Byrd is a 61 y/o male with a history of DM, cataract, PE, cirrhosis, previous tobacco/ alcohol use and chronic heart failure.   Echo report from 06/05/20 reviewed and showed an EF of >55% along with moderate LVH.   Has not been admitted or been in the ED in the last 6 months.   He presents today for a follow-up visit with a chief complaint of difficulty sleeping. Describes this as chronic in nature. He has associated slight weight gain along with this. He denies any dizziness, fatigue, cough, shortness of breath, chest pain, pedal edema, palpitations or abdominal distention.   Had his echo done earlier today.   Past Medical History:  Diagnosis Date   Alcohol abuse    Ascites    Ascites due to alcoholic cirrhosis (HCC)    CHF (congestive heart failure) ([place])    Cirrhosis ([place])    Diabetes mellitus without complication ([place])    History of congestive heart failure 07/12/2020   History of methicillin resistant staphylococcus aureus (MRSA) 2016   Pneumonia 2021   Portal hypertension (HCC)    Pulmonary embolus (HCC)    SBP (spontaneous bacterial peritonitis) ([place]) 06/07/2020   Spontaneous bacterial peritonitis ([place])    Past Surgical History:  Procedure Laterality Date   CATARACT EXTRACTION     COLONOSCOPY WITH PROPOFOL N/A 01/03/2021   Procedure: COLONOSCOPY WITH PROPOFOL;  Surgeon: Lin Landsman, MD;  Location: [facility] ENDOSCOPY;  Service: Gastroenterology;  Laterality: N/A;   COLONOSCOPY WITH PROPOFOL N/A 09/18/2021   Procedure: COLONOSCOPY WITH PROPOFOL;  Surgeon: Lin Landsman, MD;  Location: [facility] ENDOSCOPY;  Service: Gastroenterology;  Laterality: N/A;   ESOPHAGOGASTRODUODENOSCOPY (EGD) WITH PROPOFOL N/A 01/03/2021   Procedure: ESOPHAGOGASTRODUODENOSCOPY (EGD) WITH PROPOFOL;  Surgeon: Lin Landsman, MD;  Location: [facility] ENDOSCOPY;  Service: Gastroenterology;  Laterality: N/A;   EYE  SURGERY     INGUINAL HERNIA REPAIR Left 04/11/2021   Procedure: HERNIA REPAIR INGUINAL ADULT, open, WITH DIAGNOSTIC LAPAROSCOPY, AND ROBOT ASSISTED;  Surgeon: Jules Husbands, MD;  Location: [facility] ORS;  Service: General;  Laterality: Left;  provider requesting 2 hours / 120 minutes for procedure.   PARACENTESIS  2021   TESTICULAR EXPLORATION Left 04/11/2021   Procedure: TESTICULAR EXPLORATION AND REMOVAL OF LEFT TESTICLE;  Surgeon: Billey Co, MD;  Location: [facility] ORS;  Service: Urology;  Laterality: Left;   UMBILICAL HERNIA REPAIR N/A 04/04/2021   Procedure: HERNIA REPAIR UMBILICAL ADULT, open;  Surgeon: Jules Husbands, MD;  Location: [facility] ORS;  Service: General;  Laterality: N/A;   XI ROBOTIC ASSISTED INGUINAL HERNIA REPAIR WITH MESH Left 04/04/2021   Procedure: XI ROBOTIC ASSISTED INGUINAL HERNIA REPAIR WITH MESH;  Surgeon: Jules Husbands, MD;  Location: [facility] ORS;  Service: General;  Laterality: Left;   No family history on file. Social History   Tobacco Use   Smoking status: Every Day    Packs/day: 0.25    Types: Cigarettes   Smokeless tobacco: Never  Substance Use Topics   Alcohol use: Not Currently   No Known Allergies  Prior to Admission medications   Medication Sig Start Date End Date Taking? Authorizing Provider  blood glucose meter kit and supplies KIT Dispense based on patient and insurance preference. Use up to four times daily as directed. (FOR ICD-9 250.00, 250.01). 07/12/20  Yes Iloabachie, Chioma E, NP  folic acid (FOLVITE) 1 MG tablet TAKE  1 TABLET BY MOUTH EVERY DAY 07/11/22  Yes Jon Billings, NP  furosemide (LASIX) 40 MG tablet TAKE ONE TABLET BY MOUTH EVERY DAY Patient taking differently: Take 20 mg by mouth daily. 10/14/21 10/14/22 Yes Tatijana Bierly, Aura Fey, FNP  Omega-3 Fatty Acids (FISH OIL) 1000 MG CAPS Take 2 capsules by mouth in the morning and at bedtime.   Yes [provider]  omeprazole (PRILOSEC) 20 MG capsule Take 1 capsule (20 mg total) by mouth  daily. 10/29/21  Yes Jon Billings, NP  sacubitril-valsartan (ENTRESTO) 24-26 MG Take 1 tablet by mouth 2 (two) times daily. 04/09/22  Yes Darylene Price A, FNP  spironolactone (ALDACTONE) 100 MG tablet Take 1 tablet (100 mg total) by mouth 2 (two) times daily. Patient taking differently: Take 100 mg by mouth 2 (two) times daily. Taking 1 tablet once daily 04/30/22  Yes Posey Jasmin A, FNP  thiamine (VITAMIN B-1) 100 MG tablet TAKE ONE TABLET BY MOUTH EVERY DAY 04/07/22 04/07/23 Yes Cannady, Henrine Screws T, NP   Review of Systems  Constitutional:  Negative for activity change, appetite change and fatigue.  HENT:  Negative for congestion, postnasal drip and sore throat.   Respiratory:  Negative for cough, chest tightness and shortness of breath.   Cardiovascular:  Negative for chest pain, palpitations and leg swelling.  Gastrointestinal:  Negative for abdominal distention and abdominal pain.  Endocrine: Negative.   Genitourinary: Negative.   Musculoskeletal:  Negative for back pain, gait problem and neck pain.  Skin: Negative.   Allergic/Immunologic: Negative.   Neurological:  Negative for dizziness and light-headedness.  Hematological:  Negative for adenopathy. Does not bruise/bleed easily.  Psychiatric/Behavioral:  Positive for sleep disturbance (sleeping on 2 pillows). Negative for dysphoric mood. The patient is not nervous/anxious.    Vitals:   07/17/22 1330  BP: 94/66  Pulse: 67  Resp: 16  SpO2: 100%  Weight: 213 lb (96.6 kg)  Height: 5' 7"  (1.702 m)   Wt Readings from Last 3 Encounters:  07/17/22 213 lb (96.6 kg)  06/11/22 208 lb 4 oz (94.5 kg)  03/06/22 216 lb 2 oz (98 kg)   Lab Results  Component Value Date   CREATININE 0.99 01/23/2022   CREATININE 1.00 10/21/2021   CREATININE 1.03 08/29/2021   Physical Exam Vitals and nursing note reviewed.  Constitutional:      Appearance: Normal appearance.  HENT:     Head: Normocephalic and atraumatic.  Cardiovascular:     Rate and  Rhythm: Normal rate and regular rhythm.  Pulmonary:     Effort: Pulmonary effort is normal. No respiratory distress.     Breath sounds: No wheezing or rales.  Abdominal:     General: There is no distension.     Palpations: Abdomen is soft.     Tenderness: There is no abdominal tenderness.  Musculoskeletal:        General: No tenderness.     Cervical back: Normal range of motion.     Right lower leg: No edema.     Left lower leg: No edema.  Skin:    General: Skin is warm and dry.  Neurological:     General: No focal deficit present.     Mental Status: He is alert and oriented to person, place, and time.  Psychiatric:        Mood and Affect: Mood normal.        Behavior: Behavior normal.        Thought Content: Thought content normal.  Assessment & Plan:  1: Chronic heart failure with preserved ejection fraction with structural changes (LVH)- - NYHA class I - euvolemic today - weighing daily; reminded to call for an overnight weight gain of >2 pounds or a weekly weight gain of >5 pounds - weight up 5 pounds from last visit here 6 months ago - not adding salt to his food  - on GDMT of entresto - echo was done earlier today - BMP 01/23/22 reviewed and showed sodium 138, potassium 4.8, creatinine 0.99 and GFR 88 - due to BP of 94/66, will use furosemide PRN only for weight gain or swelling - BNP 06/05/20 was 155.9  2: DM- - saw PCP Mathis Dad) 10/21/21 - checks glucose at home weekly and last time it was checked it was 108 - A1c 01/23/22 was 5.6%  3: Alcoholic cirrhosis- - says that he hasn't had any alcohol since June 2021. - saw GI ([facility]) 03/06/22 - abdominal ultrasound done 03/12/22   Medication bottles were reviewed.   Return in 6 weeks, sooner if needed.

## 2022-07-17 NOTE — Patient Instructions (Addendum)
Continue weighing daily and call for an overnight weight gain of 3 pounds or more or a weekly weight gain of more than 5 pounds.   If you have voicemail, please make sure your mailbox is cleaned out so that we may leave a message and please make sure to listen to any voicemails.    Take your furosemide (lasix) only for above weight gain or swelling in your legs.

## 2022-07-17 NOTE — Progress Notes (Signed)
*  PRELIMINARY RESULTS* Echocardiogram 2D Echocardiogram has been performed.  Sherrie Sport 07/17/2022, 11:36 AM

## 2022-07-18 ENCOUNTER — Telehealth: Payer: Self-pay

## 2022-07-18 LAB — ECHOCARDIOGRAM COMPLETE
AR max vel: 3 cm2
AV Area VTI: 3.3 cm2
AV Area mean vel: 3.04 cm2
AV Mean grad: 3.5 mmHg
AV Peak grad: 6.3 mmHg
Ao pk vel: 1.26 m/s
Area-P 1/2: 2.81 cm2
S' Lateral: 2.3 cm

## 2022-07-18 NOTE — Telephone Encounter (Addendum)
Left voicemail for patient with message about echo results below. Instructed patient to call our clinic if he would like more details or has any questions.  Georg Ruddle, RN ----- Message from Alisa Graff, [place] sent at 07/18/2022  7:54 AM EDT ----- Heart function looks great! Even a little bit better than 2 years ago. Heart valves look good. Continue current medications.

## 2022-07-23 ENCOUNTER — Ambulatory Visit (INDEPENDENT_AMBULATORY_CARE_PROVIDER_SITE_OTHER): Payer: Medicaid Other | Admitting: Nurse Practitioner

## 2022-07-23 ENCOUNTER — Encounter: Payer: Self-pay | Admitting: Nurse Practitioner

## 2022-07-23 VITALS — BP 111/70 | HR 91 | Temp 97.8°F | Wt 212.8 lb

## 2022-07-23 DIAGNOSIS — I5032 Chronic diastolic (congestive) heart failure: Secondary | ICD-10-CM

## 2022-07-23 DIAGNOSIS — K7030 Alcoholic cirrhosis of liver without ascites: Secondary | ICD-10-CM

## 2022-07-23 DIAGNOSIS — E1165 Type 2 diabetes mellitus with hyperglycemia: Secondary | ICD-10-CM | POA: Diagnosis not present

## 2022-07-23 DIAGNOSIS — E7800 Pure hypercholesterolemia, unspecified: Secondary | ICD-10-CM | POA: Diagnosis not present

## 2022-07-23 NOTE — Assessment & Plan Note (Signed)
Chronic.  Controlled.  Continue with current medication regimen.  Continue to follow up with Cardiology.  Labs ordered today.  Return to clinic in 6 months for reevaluation.  Call sooner if concerns arise.  - Reminded to call for an overnight weight gain of >2 pounds or a weekly weight gain of >5 pounds - not adding salt to food and read food labels. Reviewed the importance of keeping daily sodium intake to '2000mg'$  daily. - Avoid Ibuprofen products.

## 2022-07-23 NOTE — Assessment & Plan Note (Signed)
Chronic.  Controlled.  Continue with current medication regimen.  Refrain from alcohol use.  Continue with Thiamine.  Labs ordered today.  Return to clinic in 6 months for reevaluation.  Call sooner if concerns arise.

## 2022-07-23 NOTE — Assessment & Plan Note (Signed)
Chronic.  Controlled without medication.  Blood sugar today was 132.  Labs ordered today.  Return to clinic in 6 months for reevaluation.  Call sooner if concerns arise.

## 2022-07-23 NOTE — Progress Notes (Signed)
BP 111/70   Pulse 91   Temp 97.8 F (36.6 C) (Oral)   Wt 212 lb 12.8 oz (96.5 kg)   SpO2 96%   BMI 33.33 kg/m    Subjective:    Patient ID: Collin Byrd, male    DOB: 1961/11/14, 61 y.o.   MRN: [id number]  HPI: Collin Byrd is a 61 y.o. male  Chief Complaint  Patient presents with   Hyperlipidemia   Hypertension    6 month follow up no questions per patient    Diabetes   DIABETES Hypoglycemic episodes:no Polydipsia/polyuria: no Visual disturbance: no Chest pain: no Paresthesias: no Glucose Monitoring: yes  Accucheck frequency:  once weekly  Fasting glucose: 133  Post prandial:  Evening:  Before meals: Taking Insulin?: no  Long acting insulin:  Short acting insulin: Blood Pressure Monitoring: daily Retinal Examination:  states he had it done in [place] Exam: Up to Date Diabetic Education: Not Completed Pneumovax: Up to Date Influenza: Up to Date Aspirin: no  HYPERTENSION / HYPERLIPIDEMIA- recently saw HF clinic and CHF is stable.  Satisfied with current treatment? no Duration of hypertension: years BP monitoring frequency: not checking BP range:  BP medication side effects: no Past BP meds:  Entresto and spironalactone Duration of hyperlipidemia: years Cholesterol medication side effects: no Cholesterol supplements: none Past cholesterol medications: none Medication compliance: excellent compliance Aspirin: no Recent stressors: no Recurrent headaches: no Visual changes: no Palpitations: no Dyspnea: no Chest pain: no Lower extremity edema: no Dizzy/lightheaded: no      Relevant past medical, surgical, family and social history reviewed and updated as indicated. Interim medical history since our last visit reviewed. Allergies and medications reviewed and updated.  Review of Systems  Constitutional:  Negative for unexpected weight change.  Eyes:  Negative for visual disturbance.  Respiratory:  Negative for chest tightness  and shortness of breath.   Cardiovascular:  Negative for chest pain, palpitations and leg swelling.  Endocrine: Negative for polydipsia and polyuria.  Neurological:  Negative for dizziness, light-headedness, numbness and headaches.    Per HPI unless specifically indicated above     Objective:    BP 111/70   Pulse 91   Temp 97.8 F (36.6 C) (Oral)   Wt 212 lb 12.8 oz (96.5 kg)   SpO2 96%   BMI 33.33 kg/m   Wt Readings from Last 3 Encounters:  07/23/22 212 lb 12.8 oz (96.5 kg)  07/17/22 213 lb (96.6 kg)  06/11/22 208 lb 4 oz (94.5 kg)    Physical Exam Vitals and nursing note reviewed.  Constitutional:      General: He is not in acute distress.    Appearance: Normal appearance. He is not ill-appearing, toxic-appearing or diaphoretic.  HENT:     Head: Normocephalic.     Right Ear: External ear normal.     Left Ear: External ear normal.     Nose: Nose normal. No congestion or rhinorrhea.     Mouth/Throat:     Mouth: Mucous membranes are moist.  Eyes:     General:        Right eye: No discharge.        Left eye: No discharge.     Extraocular Movements: Extraocular movements intact.     Conjunctiva/sclera: Conjunctivae normal.     Pupils: Pupils are equal, round, and reactive to light.  Cardiovascular:     Rate and Rhythm: Normal rate and regular rhythm.     Heart sounds: No murmur heard.  Pulmonary:     Effort: Pulmonary effort is normal. No respiratory distress.     Breath sounds: Normal breath sounds. No wheezing, rhonchi or rales.  Abdominal:     General: Abdomen is flat. Bowel sounds are normal.  Musculoskeletal:     Cervical back: Normal range of motion and neck supple.  Skin:    General: Skin is warm and dry.     Capillary Refill: Capillary refill takes less than 2 seconds.  Neurological:     General: No focal deficit present.     Mental Status: He is alert and oriented to person, place, and time.  Psychiatric:        Mood and Affect: Mood normal.         Behavior: Behavior normal.        Thought Content: Thought content normal.        Judgment: Judgment normal.     Results for orders placed or performed during the hospital encounter of 07/17/22  ECHOCARDIOGRAM COMPLETE  Result Value Ref Range   Ao pk vel 1.26 m/s   AV Area VTI 3.30 cm2   AR max vel 3.00 cm2   AV Mean grad 3.5 mmHg   AV Peak grad 6.3 mmHg   S' Lateral 2.30 cm   AV Area mean vel 3.04 cm2   Area-P 1/2 2.81 cm2      Assessment & Plan:   Problem List Items Addressed This Visit       Cardiovascular and Mediastinum   Chronic diastolic heart failure ([place]) - Primary    Chronic.  Controlled.  Continue with current medication regimen.  Continue to follow up with Cardiology.  Labs ordered today.  Return to clinic in 6 months for reevaluation.  Call sooner if concerns arise.  - Reminded to call for an overnight weight gain of >2 pounds or a weekly weight gain of >5 pounds - not adding salt to food and read food labels. Reviewed the importance of keeping daily sodium intake to <2046m daily. - Avoid Ibuprofen products.       Relevant Orders   Comp Met (CMET)     Digestive   Hepatic cirrhosis (HCC)    Chronic.  Controlled.  Continue with current medication regimen.  Refrain from alcohol use.  Continue with Thiamine.  Labs ordered today.  Return to clinic in 6 months for reevaluation.  Call sooner if concerns arise.           Endocrine   Type 2 diabetes mellitus with hyperglycemia, without long-term current use of insulin (HCC)    Chronic.  Controlled without medication.  Blood sugar today was 132.  Labs ordered today.  Return to clinic in 6 months for reevaluation.  Call sooner if concerns arise.        Relevant Orders   HgB A1c   Other Visit Diagnoses     Elevated LDL cholesterol level       Relevant Orders   Lipid Profile        Follow up plan: Return in about 6 months (around 01/23/2023) for Physical and Fasting labs.

## 2022-07-24 LAB — LIPID PANEL
Chol/HDL Ratio: 4.1 ratio (ref 0.0–5.0)
Cholesterol, Total: 163 mg/dL (ref 100–199)
HDL: 40 mg/dL (ref >39)
LDL Chol Calc (NIH): 94 mg/dL (ref 0–99)
Triglycerides: 165 mg/dL — ABNORMAL HIGH (ref 0–149)
VLDL Cholesterol Cal: 29 mg/dL (ref 5–40)

## 2022-07-24 LAB — COMPREHENSIVE METABOLIC PANEL
ALT: 13 IU/L (ref 0–44)
AST: 11 IU/L (ref 0–40)
Albumin/Globulin Ratio: 1.4 (ref 1.2–2.2)
Albumin: 4.4 g/dL (ref 3.8–4.9)
Alkaline Phosphatase: 61 IU/L (ref 44–121)
BUN/Creatinine Ratio: 14 (ref 9–20)
BUN: 13 mg/dL (ref 6–24)
Bilirubin Total: 1.2 mg/dL (ref 0.0–1.2)
CO2: 20 mmol/L (ref 20–29)
Calcium: 8.7 mg/dL (ref 8.7–10.2)
Chloride: 103 mmol/L (ref 96–106)
Creatinine, Ser: 0.92 mg/dL (ref 0.76–1.27)
Globulin, Total: 3.1 g/dL (ref 1.5–4.5)
Glucose: 156 mg/dL — ABNORMAL HIGH (ref 70–99)
Potassium: 4.1 mmol/L (ref 3.5–5.2)
Sodium: 138 mmol/L (ref 134–144)
Total Protein: 7.5 g/dL (ref 6.0–8.5)
eGFR: 96 mL/min/{1.73_m2} (ref >59)

## 2022-07-24 LAB — HEMOGLOBIN A1C
Est. average glucose Bld gHb Est-mCnc: 131 mg/dL
Hgb A1c MFr Bld: 6.2 % — ABNORMAL HIGH (ref 4.8–5.6)

## 2022-07-24 NOTE — Progress Notes (Signed)
Please let patient know that his lab work looks good.  A1c is well controlled at 6.2. Cholesterol also looks good.  Continue with current medication regimen.  Follow up as discussed.

## 2022-08-26 ENCOUNTER — Encounter: Payer: Self-pay | Admitting: Family

## 2022-08-26 ENCOUNTER — Ambulatory Visit: Payer: Medicaid Other | Attending: Family | Admitting: Family

## 2022-08-26 VITALS — BP 92/76 | HR 92 | Resp 14 | Ht 67.0 in | Wt 213.4 lb

## 2022-08-26 DIAGNOSIS — E1136 Type 2 diabetes mellitus with diabetic cataract: Secondary | ICD-10-CM | POA: Insufficient documentation

## 2022-08-26 DIAGNOSIS — G479 Sleep disorder, unspecified: Secondary | ICD-10-CM | POA: Diagnosis not present

## 2022-08-26 DIAGNOSIS — E119 Type 2 diabetes mellitus without complications: Secondary | ICD-10-CM

## 2022-08-26 DIAGNOSIS — Z87891 Personal history of nicotine dependence: Secondary | ICD-10-CM | POA: Diagnosis not present

## 2022-08-26 DIAGNOSIS — K7030 Alcoholic cirrhosis of liver without ascites: Secondary | ICD-10-CM | POA: Diagnosis not present

## 2022-08-26 DIAGNOSIS — K7031 Alcoholic cirrhosis of liver with ascites: Secondary | ICD-10-CM

## 2022-08-26 DIAGNOSIS — I5032 Chronic diastolic (congestive) heart failure: Secondary | ICD-10-CM | POA: Diagnosis present

## 2022-08-26 NOTE — Progress Notes (Signed)
Patient ID: Collin Byrd, male    DOB: 01-05-1961, 61 y.o.   MRN: [id number]  HPI  Mr Silversmith is a 61 y/o male with a history of DM, cataract, PE, cirrhosis, previous tobacco/ alcohol use and chronic heart failure.   Echo report from 07/17/22 reviewed and showed an EF of 65-70%. Echo report from 06/05/20 reviewed and showed an EF of >55% along with moderate LVH.   Has not been admitted or been in the ED in the last 6 months.   He presents today for a follow-up visit with a chief complaint of difficulty sleeping. He describes this as chronic in nature. He has no other symptoms and specifically denies any dizziness, fatigue, cough, shortness of breath, chest pain, pedal edema, palpitations, abdominal distention or weight gain.   Past Medical History:  Diagnosis Date   Alcohol abuse    Ascites    Ascites due to alcoholic cirrhosis (HCC)    CHF (congestive heart failure) ([place])    Cirrhosis ([place])    Diabetes mellitus without complication ([place])    History of congestive heart failure 07/12/2020   History of methicillin resistant staphylococcus aureus (MRSA) 2016   Pneumonia 2021   Portal hypertension (HCC)    Pulmonary embolus (HCC)    SBP (spontaneous bacterial peritonitis) ([place]) 06/07/2020   Spontaneous bacterial peritonitis ([place])    Past Surgical History:  Procedure Laterality Date   CATARACT EXTRACTION     COLONOSCOPY WITH PROPOFOL N/A 01/03/2021   Procedure: COLONOSCOPY WITH PROPOFOL;  Surgeon: Lin Landsman, MD;  Location: [facility] ENDOSCOPY;  Service: Gastroenterology;  Laterality: N/A;   COLONOSCOPY WITH PROPOFOL N/A 09/18/2021   Procedure: COLONOSCOPY WITH PROPOFOL;  Surgeon: Lin Landsman, MD;  Location: [facility] ENDOSCOPY;  Service: Gastroenterology;  Laterality: N/A;   ESOPHAGOGASTRODUODENOSCOPY (EGD) WITH PROPOFOL N/A 01/03/2021   Procedure: ESOPHAGOGASTRODUODENOSCOPY (EGD) WITH PROPOFOL;  Surgeon: Lin Landsman, MD;  Location: [facility] ENDOSCOPY;  Service:  Gastroenterology;  Laterality: N/A;   EYE SURGERY     INGUINAL HERNIA REPAIR Left 04/11/2021   Procedure: HERNIA REPAIR INGUINAL ADULT, open, WITH DIAGNOSTIC LAPAROSCOPY, AND ROBOT ASSISTED;  Surgeon: Jules Husbands, MD;  Location: [facility] ORS;  Service: General;  Laterality: Left;  provider requesting 2 hours / 120 minutes for procedure.   PARACENTESIS  2021   TESTICULAR EXPLORATION Left 04/11/2021   Procedure: TESTICULAR EXPLORATION AND REMOVAL OF LEFT TESTICLE;  Surgeon: Billey Co, MD;  Location: [facility] ORS;  Service: Urology;  Laterality: Left;   UMBILICAL HERNIA REPAIR N/A 04/04/2021   Procedure: HERNIA REPAIR UMBILICAL ADULT, open;  Surgeon: Jules Husbands, MD;  Location: [facility] ORS;  Service: General;  Laterality: N/A;   XI ROBOTIC ASSISTED INGUINAL HERNIA REPAIR WITH MESH Left 04/04/2021   Procedure: XI ROBOTIC ASSISTED INGUINAL HERNIA REPAIR WITH MESH;  Surgeon: Jules Husbands, MD;  Location: [facility] ORS;  Service: General;  Laterality: Left;   No family history on file. Social History   Tobacco Use   Smoking status: Every Day    Packs/day: 0.25    Types: Cigarettes   Smokeless tobacco: Never  Substance Use Topics   Alcohol use: Not Currently   No Known Allergies  Prior to Admission medications   Medication Sig Start Date End Date Taking? Authorizing Provider  blood glucose meter kit and supplies KIT Dispense based on patient and insurance preference. Use up to four times daily as directed. (FOR ICD-9 250.00, 250.01). 07/12/20  Yes Iloabachie, Chioma E, NP  folic acid (FOLVITE) 1  MG tablet TAKE 1 TABLET BY MOUTH EVERY DAY 07/11/22  Yes Jon Billings, NP  furosemide (LASIX) 40 MG tablet TAKE ONE TABLET BY MOUTH EVERY DAY Patient taking differently: Take 20 mg by mouth as needed for fluid. 10/14/21 10/14/22 Yes Mischa Brittingham, Aura Fey, FNP  Omega-3 Fatty Acids (FISH OIL) 1000 MG CAPS Take 2 capsules by mouth in the morning and at bedtime.   Yes [provider]  omeprazole (PRILOSEC)  20 MG capsule Take 1 capsule (20 mg total) by mouth daily. 10/29/21  Yes Jon Billings, NP  sacubitril-valsartan (ENTRESTO) 24-26 MG Take 1 tablet by mouth 2 (two) times daily. 04/09/22  Yes Darylene Price A, FNP  spironolactone (ALDACTONE) 100 MG tablet Take 1 tablet (100 mg total) by mouth 2 (two) times daily. Patient taking differently: Take 100 mg by mouth 2 (two) times daily. Taking 1 tablet once daily 04/30/22  Yes Kenzel Ruesch A, FNP  thiamine (VITAMIN B-1) 100 MG tablet TAKE ONE TABLET BY MOUTH EVERY DAY 04/07/22 04/07/23 Yes Cannady, Henrine Screws T, NP    Review of Systems  Constitutional:  Negative for activity change, appetite change and fatigue.  HENT:  Negative for congestion, postnasal drip and sore throat.   Respiratory:  Negative for cough, chest tightness and shortness of breath.   Cardiovascular:  Negative for chest pain, palpitations and leg swelling.  Gastrointestinal:  Negative for abdominal distention and abdominal pain.  Endocrine: Negative.   Genitourinary: Negative.   Musculoskeletal:  Negative for back pain, gait problem and neck pain.  Skin: Negative.   Allergic/Immunologic: Negative.   Neurological:  Negative for dizziness and light-headedness.  Hematological:  Negative for adenopathy. Does not bruise/bleed easily.  Psychiatric/Behavioral:  Positive for sleep disturbance (sleeping on 2 pillows). Negative for dysphoric mood. The patient is not nervous/anxious.    Vitals:   08/26/22 1225  BP: 92/76  Pulse: 92  Resp: 14  SpO2: 99%  Weight: 213 lb 6 oz (96.8 kg)  Height: 5' 7"  (1.702 m)   Wt Readings from Last 3 Encounters:  08/26/22 213 lb 6 oz (96.8 kg)  07/23/22 212 lb 12.8 oz (96.5 kg)  07/17/22 213 lb (96.6 kg)   Lab Results  Component Value Date   CREATININE 0.92 07/23/2022   CREATININE 0.99 01/23/2022   CREATININE 1.00 10/21/2021   Physical Exam Vitals and nursing note reviewed.  Constitutional:      Appearance: Normal appearance.  HENT:     Head:  Normocephalic and atraumatic.  Cardiovascular:     Rate and Rhythm: Normal rate and regular rhythm.  Pulmonary:     Effort: Pulmonary effort is normal. No respiratory distress.     Breath sounds: No wheezing or rales.  Abdominal:     General: There is no distension.     Palpations: Abdomen is soft.     Tenderness: There is no abdominal tenderness.  Musculoskeletal:        General: No tenderness.     Cervical back: Normal range of motion.     Right lower leg: No edema.     Left lower leg: No edema.  Skin:    General: Skin is warm and dry.  Neurological:     General: No focal deficit present.     Mental Status: He is alert and oriented to person, place, and time.  Psychiatric:        Mood and Affect: Mood normal.        Behavior: Behavior normal.  Thought Content: Thought content normal.    Assessment & Plan:  1: Chronic heart failure with preserved ejection fraction without structural changes- - NYHA class I - euvolemic today - weighing daily; reminded to call for an overnight weight gain of >2 pounds or a weekly weight gain of >5 pounds - weight unchanged from last visit here 5 weeks ago - not adding salt to his food  - on GDMT of entresto; current BP will not allow for any other GDMT - BMP 07/23/22 reviewed and showed sodium 138, potassium 4.1, creatinine 0.92 and GFR 96 - BNP 06/05/20 was 155.9  2: DM- - saw PCP Mathis Dad) 07/23/22 - home glucose this morning was 167 - A1c 07/23/22 was 5.0%  3: Alcoholic cirrhosis- - says that he hasn't had any alcohol since June 2021. - saw GI ([facility]) 03/06/22 - abdominal ultrasound done 03/12/22   Medication bottles were reviewed.   Return in 6 months, sooner if needed.

## 2022-08-26 NOTE — Patient Instructions (Signed)
Continue weighing daily and call for an overnight weight gain of 3 pounds or more or a weekly weight gain of more than 5 pounds.   If you have voicemail, please make sure your mailbox is cleaned out so that we may leave a message and please make sure to listen to any voicemails.     

## 2022-09-08 ENCOUNTER — Ambulatory Visit: Payer: Medicaid Other | Admitting: Gastroenterology

## 2022-09-09 ENCOUNTER — Telehealth: Payer: Self-pay

## 2022-09-09 NOTE — Telephone Encounter (Signed)
Patient called and left a voicemail on my phone on 09/08/2022. Called patient back and left a message for call back

## 2022-09-10 ENCOUNTER — Encounter: Payer: Self-pay | Admitting: Gastroenterology

## 2022-09-10 ENCOUNTER — Ambulatory Visit (INDEPENDENT_AMBULATORY_CARE_PROVIDER_SITE_OTHER): Payer: Medicaid Other | Admitting: Gastroenterology

## 2022-09-10 ENCOUNTER — Other Ambulatory Visit
Admission: RE | Admit: 2022-09-10 | Discharge: 2022-09-10 | Disposition: A | Discharge status: Home / Self Care | Payer: Medicaid Other | Attending: Gastroenterology | Admitting: Gastroenterology

## 2022-09-10 VITALS — BP 110/67 | HR 97 | Temp 98.5°F | Ht 67.0 in | Wt 221.2 lb

## 2022-09-10 DIAGNOSIS — K7031 Alcoholic cirrhosis of liver with ascites: Secondary | ICD-10-CM

## 2022-09-10 LAB — BILIRUBIN, DIRECT: Bilirubin, Direct: 0.2 mg/dL (ref 0.0–0.2)

## 2022-09-10 LAB — COMPREHENSIVE METABOLIC PANEL
ALT: 19 U/L (ref 0–44)
AST: 19 U/L (ref 15–41)
Albumin: 4 g/dL (ref 3.5–5.0)
Alkaline Phosphatase: 55 U/L (ref 38–126)
Anion gap: 9 (ref 5–15)
BUN: 20 mg/dL (ref 6–20)
CO2: 20 mmol/L — ABNORMAL LOW (ref 22–32)
Calcium: 9 mg/dL (ref 8.9–10.3)
Chloride: 108 mmol/L (ref 98–111)
Creatinine, Ser: 0.95 mg/dL (ref 0.61–1.24)
GFR, Estimated: >60 mL/min (ref >60)
Glucose, Bld: 163 mg/dL — ABNORMAL HIGH (ref 70–99)
Potassium: 4 mmol/L (ref 3.5–5.1)
Sodium: 137 mmol/L (ref 135–145)
Total Bilirubin: 1.1 mg/dL (ref 0.3–1.2)
Total Protein: 7.8 g/dL (ref 6.5–8.1)

## 2022-09-10 NOTE — Patient Instructions (Addendum)
I got your RUQ ultrasound [facility] for a 8:30am scan. Nothing to eat or drink after midnight   Stop the Folic acid tablet daily.

## 2022-09-10 NOTE — Progress Notes (Signed)
Collin Darby, MD [address]  [place]  [place], [place] [address]  Main: [phone]  Fax: [phone]    Gastroenterology Consultation  Referring Provider:     Jon Billings, NP Primary Care Physician:  Jon Billings, NP Primary Gastroenterologist:  Dr. Cephas Byrd Reason for Consultation:     Cirrhosis of liver        HPI:   Collin Byrd is a 61 y.o. male referred by Dr. Jon Billings, NP  for consultation & management of cirrhosis of liver.  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  He also has history of pneumonia and pulmonary embolism, is currently on anticoagulation.  Patient is followed by hematologist, Dr. Rogue Bussing.  Patient underwent therapeutic paracentesis in June 2021, was diagnosed with SBP as well as fluid analysis consistent with portal hypertension.  He was treated with antibiotics and was discharged home on Lasix as well as spironolactone.  Patient stopped drinking alcohol since June and is adherent to his medications.  He reports doing well since then.  He denies swelling of abdomen, distention of abdomen, swelling of legs.  He denies black stools, rectal bleeding, hematemesis or abdominal pain.  His most recent labs revealed mild normocytic anemia, normal LFTs and renal function.  He is currently on oral iron, thiamine and folate. He is on disability, currently not working  CT chest 11/01/2020 revealed resolution of prior pulmonary embolus   Follow-up visit 09/10/2022 Patient is here for follow-up of cirrhosis.  He has been doing well from cirrhosis standpoint.  He continues to take spironolactone 100 mg daily and furosemide 20 mg daily.  He has gained about 10 pounds.  He denies any abdominal distention, abdominal discomfort, melena, rectal bleeding, nausea or vomiting.  He is leading sedentary lifestyle, not exercising or walking, does consume red meat regularly.  Patient does not have  any GI concerns today  NSAIDs: None  Antiplts/Anticoagulants/Anti thrombotics: Completed the treatment for Xarelto for history of PE after 6 months of anticoagulation  GI Procedures:  EGD and colonoscopy 01/03/2021 - Normal duodenal bulb and second portion of the duodenum. - Portal hypertensive gastropathy. Biopsied. - A single gastric polyp. Biopsied. Clip (MR conditional) was placed. - Small (< 5 mm) esophageal varices. - Esophagogastric landmarks identified. - Normal proximal esophagus, mid esophagus and gastroesophageal junction.  Procedure aborted due to large left inguinal hernia, advanced to transverse colon only The perianal and digital rectal examinations were normal. Pertinent negatives include normal sphincter tone and no palpable rectal lesions. Two sessile polyps were found in the descending colon and transverse colon. The polyps were 3 to 5 mm in size. These polyps were removed with a cold snare. Resection and retrieval were complete. Estimated blood loss: none. Non-bleeding external and internal hemorrhoids were found during retroflexion. The hemorrhoids were large.  DIAGNOSIS:  A. STOMACH; COLD BIOPSY:  - MODERATE CHRONIC ACTIVE GASTRITIS.  - ANTRAL MUCOSA WITH FOCAL INTESTINAL METAPLASIA.  - SIDEROSIS.  - SEE COMMENT.  - NEGATIVE FOR H.PYLORI, DYSPLASIA AND MALIGNANCY.   B.  STOMACH POLYPS; COLD BIOPSY:  - INFLAMED POLYPOID FRAGMENT OF MUCOSA WITH REACTIVE FOVEOLAR  HYPERPLASIA.  - NEGATIVE FOR H.PYLORI, CMV, DYSPLASIA AND MALIGNANCY.   C.  COLON POLYP X3, DESCENDING; COLD SNARE:  - TUBULAR ADENOMA (MULTIPLE FRAGMENTS).  - NEGATIVE FOR HIGH-GRADE DYSPLASIA AND MALIGNANCY.   Colonoscopy 09/18/2021 - Five 3 to 5 mm polyps in the descending colon, in the ascending colon and in  the cecum, removed with a cold snare. Resected and retrieved. - Non-bleeding external and internal hemorrhoids. - Diverticulosis in the sigmoid colon and in the right colon. DIAGNOSIS:  A.  COLON POLYPS X2, CECUM; COLD SNARE:  - MULTIPLE FRAGMENTS OF TUBULAR ADENOMAS.  - NEGATIVE FOR HIGH-GRADE DYSPLASIA AND MALIGNANCY.   B. COLON POLYP, ASCENDING; COLD SNARE:  - TUBULAR ADENOMA.  - NEGATIVE FOR HIGH-GRADE DYSPLASIA AND MALIGNANCY.   C. COLON POLYPS X2, DESCENDING; COLD SNARE:  - TUBULAR ADENOMA.  - NEGATIVE FOR HIGH-GRADE DYSPLASIA AND MALIGNANCY.   He denies family history of GI malignancy, liver cancer  Past Medical History:  Diagnosis Date   Alcohol abuse    Ascites    Ascites due to alcoholic cirrhosis (HCC)    CHF (congestive heart failure) ([place])    Cirrhosis ([place])    Diabetes mellitus without complication ([place])    History of congestive heart failure 07/12/2020   History of methicillin resistant staphylococcus aureus (MRSA) 2016   Pneumonia 2021   Portal hypertension (HCC)    Pulmonary embolus (HCC)    SBP (spontaneous bacterial peritonitis) ([place]) 06/07/2020   Spontaneous bacterial peritonitis ([place])     Past Surgical History:  Procedure Laterality Date   CATARACT EXTRACTION     COLONOSCOPY WITH PROPOFOL N/A 01/03/2021   Procedure: COLONOSCOPY WITH PROPOFOL;  Surgeon: Lin Landsman, MD;  Location: [facility] ENDOSCOPY;  Service: Gastroenterology;  Laterality: N/A;   COLONOSCOPY WITH PROPOFOL N/A 09/18/2021   Procedure: COLONOSCOPY WITH PROPOFOL;  Surgeon: Lin Landsman, MD;  Location: [facility] ENDOSCOPY;  Service: Gastroenterology;  Laterality: N/A;   ESOPHAGOGASTRODUODENOSCOPY (EGD) WITH PROPOFOL N/A 01/03/2021   Procedure: ESOPHAGOGASTRODUODENOSCOPY (EGD) WITH PROPOFOL;  Surgeon: Lin Landsman, MD;  Location: [facility] ENDOSCOPY;  Service: Gastroenterology;  Laterality: N/A;   EYE SURGERY     INGUINAL HERNIA REPAIR Left 04/11/2021   Procedure: HERNIA REPAIR INGUINAL ADULT, open, WITH DIAGNOSTIC LAPAROSCOPY, AND ROBOT ASSISTED;  Surgeon: Jules Husbands, MD;  Location: [facility] ORS;  Service: General;  Laterality: Left;  provider requesting 2 hours / 120 minutes  for procedure.   PARACENTESIS  2021   TESTICULAR EXPLORATION Left 04/11/2021   Procedure: TESTICULAR EXPLORATION AND REMOVAL OF LEFT TESTICLE;  Surgeon: Billey Co, MD;  Location: [facility] ORS;  Service: Urology;  Laterality: Left;   UMBILICAL HERNIA REPAIR N/A 04/04/2021   Procedure: HERNIA REPAIR UMBILICAL ADULT, open;  Surgeon: Jules Husbands, MD;  Location: [facility] ORS;  Service: General;  Laterality: N/A;   XI ROBOTIC ASSISTED INGUINAL HERNIA REPAIR WITH MESH Left 04/04/2021   Procedure: XI ROBOTIC ASSISTED INGUINAL HERNIA REPAIR WITH MESH;  Surgeon: Jules Husbands, MD;  Location: [facility] ORS;  Service: General;  Laterality: Left;    Current Outpatient Medications:    blood glucose meter kit and supplies KIT, Dispense based on patient and insurance preference. Use up to four times daily as directed. (FOR ICD-9 250.00, 250.01)., Disp: 1 each, Rfl: 0   furosemide (LASIX) 40 MG tablet, TAKE ONE TABLET BY MOUTH EVERY DAY (Patient taking differently: Take 20 mg by mouth as needed for fluid.), Disp: 30 tablet, Rfl: 5   Omega-3 Fatty Acids (FISH OIL) 1000 MG CAPS, Take 2 capsules by mouth in the morning and at bedtime., Disp: , Rfl:    omeprazole (PRILOSEC) 20 MG capsule, Take 1 capsule (20 mg total) by mouth daily., Disp: 90 capsule, Rfl: 3   sacubitril-valsartan (ENTRESTO) 24-26 MG, Take 1 tablet by mouth 2 (two) times daily., Disp: 60 tablet,  Rfl: 6   spironolactone (ALDACTONE) 100 MG tablet, Take 1 tablet (100 mg total) by mouth 2 (two) times daily. (Patient taking differently: Take 100 mg by mouth 2 (two) times daily. Taking 1 tablet once daily), Disp: 60 tablet, Rfl: 5   thiamine (VITAMIN B-1) 100 MG tablet, TAKE ONE TABLET BY MOUTH EVERY DAY, Disp: 90 tablet, Rfl: 1  No family history on file.   Social History   Tobacco Use   Smoking status: Every Day    Packs/day: 0.25    Types: Cigarettes   Smokeless tobacco: Never  Vaping Use   Vaping Use: Never used  Substance Use Topics   Alcohol use:  Not Currently   Drug use: Never    Allergies as of 09/10/2022   (No Known Allergies)    Review of Systems:    All systems reviewed and negative except where noted in HPI.   Physical Exam:  BP 110/67 (BP Location: Left Arm, Patient Position: Sitting, Cuff Size: Normal)   Pulse 97   Temp 98.5 F (36.9 C) (Oral)   Ht 5' 7" (1.702 m)   Wt 221 lb 4 oz (100.4 kg)   BMI 34.65 kg/m  No LMP for male patient.  General:   Alert,  Well-developed, well-nourished, pleasant and cooperative in NAD Head:  Normocephalic and atraumatic. Eyes:  Sclera clear, no icterus.   Conjunctiva pink.  Congested right eye Ears:  Normal auditory acuity. Nose:  No deformity, discharge, or lesions. Mouth:  No deformity or lesions,oropharynx pink & moist. Neck:  Supple; no masses or thyromegaly. Lungs:  Respirations even and unlabored.  Clear throughout to auscultation.   No wheezes, crackles, or rhonchi. No acute distress. Heart:  Regular rate and rhythm; no murmurs, clicks, rubs, or gallops. Abdomen:  Normal bowel sounds. Soft, non-tender and non-distended without masses, hepatosplenomegaly, large left inguinal hernia noted.  No guarding or rebound tenderness.   Rectal: Not performed Msk:  Symmetrical without gross deformities. Good, equal movement & strength bilaterally. Pulses:  Normal pulses noted. Extremities:  No clubbing or edema.  No cyanosis. Neurologic:  Alert and oriented x3;  grossly normal neurologically. Skin:  Intact without significant lesions or rashes. No jaundice. Psych:  Alert and cooperative. Normal mood and affect.  Imaging Studies: Reviewed  Assessment and Plan:   Darreld Hoffer is a 61 y.o. Hispanic male with diabetes, decompensated alcoholic cirrhosis with ascites, history of SBP, pneumonia and pulmonary embolism s/p 6 months of anticoagulation on Xarelto is seen in consultation for follow-up of cirrhosis  Alcoholic cirrhosis of liver, child Pugh class B, low meld, currently  well compensated Viral hepatitis panel negative, s/p Twinrix vaccine x3 doses Iron deficiency anemia has resolved, s/p iron replacement therapy Volume overload: Currently euvolemic.  He had history of ascites s/p therapeutic paracentesis in 6/21, consistent with portal hypertension Advised patient to decrease spironolactone to 50 mg daily and continue Lasix 20 mg daily Continue low-sodium diet No evidence of esophageal or gastric varices HCC screening: AFP levels normal and CT did not reveal liver lesions in 6/21 and 4/22 Recommend right upper quadrant ultrasound PSE: None Continue to remain abstinent from alcohol use Strongly advised patient to follow healthy diet, healthy eating habits and exercise, not to gain weight  Moderate chronic active gastritis H. pylori IgG was positive, s/p treatment for H. pylori infection with triple therapy in 02/2021   Follow up in 6 months   Collin Darby, MD

## 2022-09-17 ENCOUNTER — Ambulatory Visit
Admission: RE | Admit: 2022-09-17 | Discharge: 2022-09-17 | Disposition: A | Discharge status: Home / Self Care | Payer: Medicaid Other | Source: Ambulatory Visit | Attending: Gastroenterology | Admitting: Gastroenterology

## 2022-09-17 DIAGNOSIS — K7031 Alcoholic cirrhosis of liver with ascites: Secondary | ICD-10-CM | POA: Diagnosis present

## 2022-09-18 ENCOUNTER — Telehealth: Payer: Self-pay

## 2022-09-18 NOTE — Telephone Encounter (Signed)
Called and patient verbalized understanding

## 2022-09-18 NOTE — Telephone Encounter (Signed)
-----   Message from Lin Landsman, MD sent at 09/18/2022 11:00 AM EDT ----- No liver lesions other than cirrhosis, repeat RUQ U/S in 79month  RV

## 2022-10-13 ENCOUNTER — Other Ambulatory Visit: Payer: Self-pay | Admitting: Nurse Practitioner

## 2022-10-13 DIAGNOSIS — F1011 Alcohol abuse, in remission: Secondary | ICD-10-CM

## 2022-10-14 NOTE — Telephone Encounter (Signed)
Requested medications are due for refill today.  unsure  Requested medications are on the active medications list.  no  Last refill. 07/11/2022   Future visit scheduled.   yes  Notes to clinic.  Please review for refill. Request states that med was d/c'd 07/11/2022, it was also refilled that day.     Requested Prescriptions  Pending Prescriptions Disp Refills   folic acid (FOLVITE) 1 MG tablet [Pharmacy Med Name: FOLIC ACID '1MG'$  TABLETS] 90 tablet 1    Sig: TAKE 1 TABLET(1 MG) BY MOUTH DAILY     Endocrinology:  Vitamins Passed - 10/14/2022  7:57 AM      Passed - Valid encounter within last 12 months    Recent Outpatient Visits           2 months ago Chronic diastolic heart failure ([place])   [facility] Jon Billings, NP   8 months ago Annual physical exam   [facility] Jon Billings, NP   11 months ago Alcoholic cirrhosis, unspecified whether ascites present [facility])   [facility] Jon Billings, NP       Future Appointments             In 3 months Jon Billings, NP [facility], [place]

## 2022-10-17 ENCOUNTER — Other Ambulatory Visit: Payer: Self-pay | Admitting: Family

## 2022-10-19 ENCOUNTER — Other Ambulatory Visit: Payer: Self-pay | Admitting: Gerontology

## 2022-10-23 ENCOUNTER — Other Ambulatory Visit: Payer: Self-pay | Admitting: Family

## 2022-10-23 MED ORDER — ENTRESTO 24-26 MG PO TABS: 1.0000 | ORAL_TABLET | Freq: Two times a day (BID) | ORAL | 6 refills | Status: DC

## 2022-11-05 ENCOUNTER — Other Ambulatory Visit: Payer: Self-pay | Admitting: Nurse Practitioner

## 2022-11-05 NOTE — Telephone Encounter (Signed)
Requested Prescriptions  Pending Prescriptions Disp Refills   omeprazole (PRILOSEC) 20 MG capsule [Pharmacy Med Name: OMEPRAZOLE '20MG'$  CAPSULES] 90 capsule 0    Sig: TAKE 1 CAPSULE(20 MG) BY MOUTH DAILY.     Gastroenterology: Proton Pump Inhibitors Passed - 11/05/2022 11:46 AM      Passed - Valid encounter within last 12 months    Recent Outpatient Visits           3 months ago Chronic diastolic heart failure ([place])   [facility] Jon Billings, NP   9 months ago Annual physical exam   [facility] Jon Billings, NP   1 year ago Alcoholic cirrhosis, unspecified whether ascites present [facility])   [facility] Jon Billings, NP       Future Appointments             In 2 months Jon Billings, NP [facility], [place]

## 2022-11-17 ENCOUNTER — Other Ambulatory Visit: Payer: Self-pay | Admitting: Gerontology

## 2022-11-18 NOTE — Telephone Encounter (Signed)
Patient no longer under [employer] care

## 2022-12-09 ENCOUNTER — Other Ambulatory Visit: Payer: Self-pay | Admitting: Family

## 2022-12-09 DIAGNOSIS — Z8679 Personal history of other diseases of the circulatory system: Secondary | ICD-10-CM

## 2022-12-09 MED ORDER — FUROSEMIDE 40 MG PO TABS: 20.0000 mg | ORAL_TABLET | ORAL | 5 refills | Status: DC | PRN

## 2022-12-18 ENCOUNTER — Other Ambulatory Visit: Payer: Self-pay | Admitting: Family

## 2022-12-18 MED ORDER — ENTRESTO 24-26 MG PO TABS: 1.0000 | ORAL_TABLET | Freq: Two times a day (BID) | ORAL | 3 refills | Status: DC

## 2022-12-18 NOTE — Progress Notes (Signed)
Printed entresto RX for [employer] patient assistance

## 2022-12-25 ENCOUNTER — Telehealth: Payer: Self-pay | Admitting: Family

## 2022-12-25 NOTE — Telephone Encounter (Signed)
Patient approved for entresto patient assistance until 12/29/23   Luetta Nutting, NT

## 2022-12-30 NOTE — Telephone Encounter (Signed)
Error

## 2023-01-23 ENCOUNTER — Ambulatory Visit (INDEPENDENT_AMBULATORY_CARE_PROVIDER_SITE_OTHER): Payer: Medicare HMO | Admitting: Nurse Practitioner

## 2023-01-23 ENCOUNTER — Encounter: Payer: Self-pay | Admitting: Nurse Practitioner

## 2023-01-23 VITALS — BP 104/65 | HR 81 | Temp 97.9°F | Ht 69.1 in | Wt 229.2 lb

## 2023-01-23 DIAGNOSIS — E781 Pure hyperglyceridemia: Secondary | ICD-10-CM | POA: Insufficient documentation

## 2023-01-23 DIAGNOSIS — I5032 Chronic diastolic (congestive) heart failure: Secondary | ICD-10-CM

## 2023-01-23 DIAGNOSIS — E1165 Type 2 diabetes mellitus with hyperglycemia: Secondary | ICD-10-CM | POA: Diagnosis not present

## 2023-01-23 DIAGNOSIS — K7030 Alcoholic cirrhosis of liver without ascites: Secondary | ICD-10-CM | POA: Diagnosis not present

## 2023-01-23 NOTE — Assessment & Plan Note (Signed)
Labs ordered at visit today.  Will make recommendations based on lab results.   

## 2023-01-23 NOTE — Assessment & Plan Note (Signed)
Chronic.  Controlled without medication.  Blood sugar today was 132.  Last A1c was 6.2% in July.  Microlabumin checked today.  Needs updated eye exam.  Labs ordered today.  Return to clinic in 6 months for reevaluation.  Call sooner if concerns arise.

## 2023-01-23 NOTE — Assessment & Plan Note (Signed)
Chronic.  Controlled.  Continue with current medication regimen.  Refrain from alcohol use.  Continue with Thiamine.  Followed by GI.  Continue to collaborate with specialist.  Labs ordered today.  Return to clinic in 6 months for reevaluation.  Call sooner if concerns arise.

## 2023-01-23 NOTE — Progress Notes (Signed)
BP 104/65   Pulse 81   Temp 97.9 F (36.6 C) (Oral)   Ht 5' 9.1" (1.755 m)   Wt 229 lb 3.2 oz (104 kg)   SpO2 96%   BMI 33.75 kg/m    Subjective:    Patient ID: Collin Byrd, male    DOB: 06-17-1961, 63 y.o.   MRN: [id number]  HPI: Collin Byrd is a 62 y.o. male  Chief Complaint  Patient presents with   Diabetes    No recent eye exam per patient   Hypertension   Hyperlipidemia   DIABETES Hypoglycemic episodes:no Polydipsia/polyuria: no Visual disturbance: no Chest pain: no Paresthesias: no Glucose Monitoring: yes  Accucheck frequency:  once weekly  Fasting glucose: 133  Post prandial:  Evening:  Before meals: Taking Insulin?: no  Long acting insulin:  Short acting insulin: Blood Pressure Monitoring: daily Retinal Examination:  states he had it done in [place] Exam: Up to Date Diabetic Education: Not Completed Pneumovax: Up to Date Influenza: Up to Date Aspirin: no  HYPERTENSION / HYPERLIPIDEMIA- next appt with the HF clinic is in March.  Satisfied with current treatment? no Duration of hypertension: years BP monitoring frequency: not checking BP range:  BP medication side effects: no Past BP meds:  Entresto and spironalactone Duration of hyperlipidemia: years Cholesterol medication side effects: no Cholesterol supplements: none Past cholesterol medications: none Medication compliance: excellent compliance Aspirin: no Recent stressors: no Recurrent headaches: no Visual changes: no Palpitations: no Dyspnea: no Chest pain: no Lower extremity edema: no Dizzy/lightheaded: no  Seeing Dr. Marius Ditch for Cirrohsis.      Relevant past medical, surgical, family and social history reviewed and updated as indicated. Interim medical history since our last visit reviewed. Allergies and medications reviewed and updated.  Review of Systems  Constitutional:  Negative for unexpected weight change.  Eyes:  Negative for visual disturbance.   Respiratory:  Negative for chest tightness and shortness of breath.   Cardiovascular:  Negative for chest pain, palpitations and leg swelling.  Endocrine: Negative for polydipsia and polyuria.  Neurological:  Negative for dizziness, light-headedness, numbness and headaches.    Per HPI unless specifically indicated above     Objective:    BP 104/65   Pulse 81   Temp 97.9 F (36.6 C) (Oral)   Ht 5' 9.1" (1.755 m)   Wt 229 lb 3.2 oz (104 kg)   SpO2 96%   BMI 33.75 kg/m   Wt Readings from Last 3 Encounters:  01/23/23 229 lb 3.2 oz (104 kg)  09/10/22 221 lb 4 oz (100.4 kg)  08/26/22 213 lb 6 oz (96.8 kg)    Physical Exam Vitals and nursing note reviewed.  Constitutional:      General: He is not in acute distress.    Appearance: Normal appearance. He is not ill-appearing, toxic-appearing or diaphoretic.  HENT:     Head: Normocephalic.     Right Ear: External ear normal.     Left Ear: External ear normal.     Nose: Nose normal. No congestion or rhinorrhea.     Mouth/Throat:     Mouth: Mucous membranes are moist.  Eyes:     General:        Right eye: No discharge.        Left eye: No discharge.     Extraocular Movements: Extraocular movements intact.     Conjunctiva/sclera: Conjunctivae normal.     Pupils: Pupils are equal, round, and reactive to light.  Cardiovascular:  Rate and Rhythm: Normal rate and regular rhythm.     Heart sounds: No murmur heard. Pulmonary:     Effort: Pulmonary effort is normal. No respiratory distress.     Breath sounds: Normal breath sounds. No wheezing, rhonchi or rales.  Abdominal:     General: Abdomen is flat. Bowel sounds are normal.  Musculoskeletal:     Cervical back: Normal range of motion and neck supple.  Skin:    General: Skin is warm and dry.     Capillary Refill: Capillary refill takes less than 2 seconds.  Neurological:     General: No focal deficit present.     Mental Status: He is alert and oriented to person, place,  and time.  Psychiatric:        Mood and Affect: Mood normal.        Behavior: Behavior normal.        Thought Content: Thought content normal.        Judgment: Judgment normal.     Results for orders placed or performed during the hospital encounter of 09/10/22  Comprehensive metabolic panel  Result Value Ref Range   Sodium 137 135 - 145 mmol/L   Potassium 4.0 3.5 - 5.1 mmol/L   Chloride 108 98 - 111 mmol/L   CO2 20 (L) 22 - 32 mmol/L   Glucose, Bld 163 (H) 70 - 99 mg/dL   BUN 20 6 - 20 mg/dL   Creatinine, Ser 0.95 0.61 - 1.24 mg/dL   Calcium 9.0 8.9 - 10.3 mg/dL   Total Protein 7.8 6.5 - 8.1 g/dL   Albumin 4.0 3.5 - 5.0 g/dL   AST 19 15 - 41 U/L   ALT 19 0 - 44 U/L   Alkaline Phosphatase 55 38 - 126 U/L   Total Bilirubin 1.1 0.3 - 1.2 mg/dL   GFR, Estimated >60 >60 mL/min   Anion gap 9 5 - 15  Bilirubin, direct  Result Value Ref Range   Bilirubin, Direct 0.2 0.0 - 0.2 mg/dL      Assessment & Plan:   Problem List Items Addressed This Visit       Cardiovascular and Mediastinum   Chronic diastolic heart failure (HCC) - Primary    Chronic.  Controlled.  Continue with current medication regimen.  Continue to follow up with Cardiology.  Labs ordered today.  Followed by HF clinic.  Has upcoming visit in March.  Continue to collaborate with specialist.  Return to clinic in 6 months for reevaluation.  Call sooner if concerns arise.  - Reminded to call for an overnight weight gain of >2 pounds or a weekly weight gain of >5 pounds - not adding salt to food and read food labels. Reviewed the importance of keeping daily sodium intake to '2000mg'$  daily. - Avoid Ibuprofen products.       Relevant Orders   Comp Met (CMET)     Digestive   Hepatic cirrhosis (HCC)    Chronic.  Controlled.  Continue with current medication regimen.  Refrain from alcohol use.  Continue with Thiamine.  Followed by GI.  Continue to collaborate with specialist.  Labs ordered today.  Return to clinic in 6  months for reevaluation.  Call sooner if concerns arise.           Endocrine   Type 2 diabetes mellitus with hyperglycemia, without long-term current use of insulin (HCC)    Chronic.  Controlled without medication.  Blood sugar today was 132.  Last A1c was  6.2% in July.  Microlabumin checked today.  Needs updated eye exam.  Labs ordered today.  Return to clinic in 6 months for reevaluation.  Call sooner if concerns arise.        Relevant Orders   HgB A1c   Microalbumin, Urine Waived     Other   Hypertriglyceridemia    Labs ordered at visit today.  Will make recommendations based on lab results.        Relevant Orders   Lipid Profile     Follow up plan: Return in about 3 months (around 04/24/2023) for [place] to medicare.

## 2023-01-23 NOTE — Assessment & Plan Note (Addendum)
Chronic.  Controlled.  Continue with current medication regimen.  Continue to follow up with Cardiology.  Labs ordered today.  Followed by HF clinic.  Has upcoming visit in March.  Continue to collaborate with specialist.  Return to clinic in 6 months for reevaluation.  Call sooner if concerns arise.  - Reminded to call for an overnight weight gain of >2 pounds or a weekly weight gain of >5 pounds - not adding salt to food and read food labels. Reviewed the importance of keeping daily sodium intake to '2000mg'$  daily. - Avoid Ibuprofen products.

## 2023-01-24 LAB — LIPID PANEL
Chol/HDL Ratio: 4.1 ratio (ref 0.0–5.0)
Cholesterol, Total: 153 mg/dL (ref 100–199)
HDL: 37 mg/dL — ABNORMAL LOW (ref >39)
LDL Chol Calc (NIH): 95 mg/dL (ref 0–99)
Triglycerides: 112 mg/dL (ref 0–149)
VLDL Cholesterol Cal: 21 mg/dL (ref 5–40)

## 2023-01-24 LAB — COMPREHENSIVE METABOLIC PANEL
ALT: 18 IU/L (ref 0–44)
AST: 14 IU/L (ref 0–40)
Albumin/Globulin Ratio: 1.3 (ref 1.2–2.2)
Albumin: 4.3 g/dL (ref 3.8–4.9)
Alkaline Phosphatase: 75 IU/L (ref 44–121)
BUN/Creatinine Ratio: 14 (ref 9–20)
BUN: 13 mg/dL (ref 6–24)
Bilirubin Total: 1.2 mg/dL (ref 0.0–1.2)
CO2: 22 mmol/L (ref 20–29)
Calcium: 9.3 mg/dL (ref 8.7–10.2)
Chloride: 99 mmol/L (ref 96–106)
Creatinine, Ser: 0.92 mg/dL (ref 0.76–1.27)
Globulin, Total: 3.4 g/dL (ref 1.5–4.5)
Glucose: 190 mg/dL — ABNORMAL HIGH (ref 70–99)
Potassium: 4.1 mmol/L (ref 3.5–5.2)
Sodium: 137 mmol/L (ref 134–144)
Total Protein: 7.7 g/dL (ref 6.0–8.5)
eGFR: 96 mL/min/{1.73_m2} (ref >59)

## 2023-01-24 LAB — HEMOGLOBIN A1C
Est. average glucose Bld gHb Est-mCnc: 197 mg/dL
Hgb A1c MFr Bld: 8.5 % — ABNORMAL HIGH (ref 4.8–5.6)

## 2023-01-26 NOTE — Progress Notes (Signed)
Please let patient know that his A1c increased to 8.5.  We should start Metformin '500mg'$  twice daily for two weeks then increase to '1000mg'$  twice daily.  Otherwise, his lab work looks good.  No other concerns at this time.  If he agrees to the medication change I will send it to the pharmacy.

## 2023-01-27 ENCOUNTER — Telehealth: Payer: Self-pay

## 2023-01-27 DIAGNOSIS — K7031 Alcoholic cirrhosis of liver with ascites: Secondary | ICD-10-CM

## 2023-01-27 NOTE — Telephone Encounter (Signed)
-----  Message from [place] sent at 09/18/2022 12:38 PM EDT ----- Repeat U/s in 6 months

## 2023-01-27 NOTE — Telephone Encounter (Signed)
Called and got patient schedule for Ultrasound RUQ on 03/09/2023 arrived at 8:00am for a 8:30am scan at [facility] nothing to eat or drink after midnight. Made follow up appointment on 03/16/2023 with dr. Marius Ditch. Informed patient and patient verbalized understanding

## 2023-01-27 NOTE — Telephone Encounter (Signed)
Needs office visit in March also.  Called and left a message for call back

## 2023-01-28 MED ORDER — METFORMIN HCL 500 MG PO TABS: 1000.0000 mg | ORAL_TABLET | Freq: Two times a day (BID) | ORAL | 1 refills | Status: DC

## 2023-01-28 NOTE — Progress Notes (Signed)
Medication sent to the pharmacy.

## 2023-01-28 NOTE — Addendum Note (Signed)
Addended by: Jon Billings on: 01/28/2023 10:18 AM   Modules accepted: Orders

## 2023-02-09 ENCOUNTER — Other Ambulatory Visit: Payer: Self-pay | Admitting: Nurse Practitioner

## 2023-02-10 NOTE — Telephone Encounter (Signed)
Requested Prescriptions  Pending Prescriptions Disp Refills   omeprazole (PRILOSEC) 20 MG capsule [Pharmacy Med Name: OMEPRAZOLE 20MG CAPSULES] 90 capsule 0    Sig: TAKE 1 CAPSULE(20 MG) BY MOUTH DAILY     Gastroenterology: Proton Pump Inhibitors Passed - 02/09/2023  4:09 PM      Passed - Valid encounter within last 12 months    Recent Outpatient Visits           2 weeks ago Chronic diastolic heart failure [facility])   [place] Jon Billings, NP   6 months ago Chronic diastolic heart failure [facility])   [place] Jon Billings, NP   1 year ago Annual physical exam   [place] Jon Billings, NP   1 year ago Alcoholic cirrhosis, unspecified whether ascites present [facility])   [place] Jon Billings, NP       Future Appointments             In 1 month [place], Tally Due, MD [facility] [place]   In 2 months Jon Billings, NP [place], [place]

## 2023-02-25 ENCOUNTER — Other Ambulatory Visit: Payer: Self-pay | Admitting: Nurse Practitioner

## 2023-02-25 NOTE — Telephone Encounter (Signed)
Dc'd 12/11/21 Collin Price NP  Requested Prescriptions  Refused Prescriptions Disp Refills   omega-3 acid ethyl esters (LOVAZA) 1 g capsule [Pharmacy Med Name: OMEGA-3-ACID 1GM CAPSULES (RX)] 180 capsule 1    Sig: TAKE 2 CAPSULES BY MOUTH IN THE MORNING AND AT BEDTIME     Endocrinology:  Nutritional Agents - omega-3 acid ethyl esters Failed - 02/25/2023 11:40 AM      Failed - Lipid Panel in normal range within the last 12 months    Cholesterol, Total  Date Value Ref Range Status  01/23/2023 153 100 - 199 mg/dL Final   LDL Chol Calc (NIH)  Date Value Ref Range Status  01/23/2023 95 0 - 99 mg/dL Final   HDL  Date Value Ref Range Status  01/23/2023 37 (L) >39 mg/dL Final   Triglycerides  Date Value Ref Range Status  01/23/2023 112 0 - 149 mg/dL Final         Passed - Valid encounter within last 12 months    Recent Outpatient Visits           1 month ago Chronic diastolic heart failure [facility])   [place], Karen, NP   7 months ago Chronic diastolic heart failure [facility])   [place] Jon Billings, NP   1 year ago Annual physical exam   [place] Jon Billings, NP   1 year ago Alcoholic cirrhosis, unspecified whether ascites present [facility])   [place] Jon Billings, NP       Future Appointments             In 2 weeks [place], Tally Due, MD [facility] at [place]   In 1 month Jon Billings, [place], [place]

## 2023-02-27 ENCOUNTER — Other Ambulatory Visit: Payer: Self-pay | Admitting: Nurse Practitioner

## 2023-03-02 ENCOUNTER — Ambulatory Visit: Payer: Medicare HMO | Attending: Family | Admitting: Family

## 2023-03-02 ENCOUNTER — Encounter: Payer: Self-pay | Admitting: Family

## 2023-03-02 VITALS — BP 112/78 | HR 92 | Wt 224.8 lb

## 2023-03-02 DIAGNOSIS — Z7984 Long term (current) use of oral hypoglycemic drugs: Secondary | ICD-10-CM | POA: Insufficient documentation

## 2023-03-02 DIAGNOSIS — I5032 Chronic diastolic (congestive) heart failure: Secondary | ICD-10-CM | POA: Diagnosis not present

## 2023-03-02 DIAGNOSIS — Z79899 Other long term (current) drug therapy: Secondary | ICD-10-CM | POA: Diagnosis not present

## 2023-03-02 DIAGNOSIS — Z86711 Personal history of pulmonary embolism: Secondary | ICD-10-CM | POA: Insufficient documentation

## 2023-03-02 DIAGNOSIS — Z87891 Personal history of nicotine dependence: Secondary | ICD-10-CM | POA: Diagnosis not present

## 2023-03-02 DIAGNOSIS — E119 Type 2 diabetes mellitus without complications: Secondary | ICD-10-CM | POA: Diagnosis not present

## 2023-03-02 DIAGNOSIS — K7030 Alcoholic cirrhosis of liver without ascites: Secondary | ICD-10-CM | POA: Insufficient documentation

## 2023-03-02 NOTE — Telephone Encounter (Signed)
Requested medications are due for refill today.  unsure  Requested medications are on the active medications list.  no  Last refill. 10/22/2021  Future visit scheduled.   yes  Notes to clinic.  Med not on med list. Medication was d/c'd. Please advise.    Requested Prescriptions  Pending Prescriptions Disp Refills   omega-3 acid ethyl esters (LOVAZA) 1 g capsule [Pharmacy Med Name: OMEGA-3-ACID 1GM CAPSULES (RX)] 180 capsule 1    Sig: TAKE 2 CAPSULES BY MOUTH IN THE MORNING AND AT BEDTIME     Endocrinology:  Nutritional Agents - omega-3 acid ethyl esters Failed - 02/27/2023 10:45 AM      Failed - Lipid Panel in normal range within the last 12 months    Cholesterol, Total  Date Value Ref Range Status  01/23/2023 153 100 - 199 mg/dL Final   LDL Chol Calc (NIH)  Date Value Ref Range Status  01/23/2023 95 0 - 99 mg/dL Final   HDL  Date Value Ref Range Status  01/23/2023 37 (L) >39 mg/dL Final   Triglycerides  Date Value Ref Range Status  01/23/2023 112 0 - 149 mg/dL Final         Passed - Valid encounter within last 12 months    Recent Outpatient Visits           1 month ago Chronic diastolic heart failure [facility])   [place], Karen, NP   7 months ago Chronic diastolic heart failure [facility])   [place] Jon Billings, NP   1 year ago Annual physical exam   [place] Jon Billings, NP   1 year ago Alcoholic cirrhosis, unspecified whether ascites present [facility])   [place] Jon Billings, NP       Future Appointments             In 2 weeks [place], Tally Due, MD [facility] at [place]   In 1 month Jon Billings, [place], [place]

## 2023-03-02 NOTE — Patient Instructions (Addendum)
Call [employer] Patient Assistance at [phone] for assistance with entresto medication  Continue weighing daily and call for an overnight weight gain of 3 pounds or more or a weekly weight gain of more than 5 pounds.

## 2023-03-02 NOTE — Progress Notes (Signed)
Patient ID: Collin Byrd, male    DOB: March 18, 1961, 62 y.o.   MRN: [id number]  HPI  [name] is a 62 y/o male with a history of DM, cataract, PE, cirrhosis, previous tobacco/ alcohol use and chronic heart failure.   Echo report from 07/17/22 reviewed and showed an EF of 65-70%. Echo report from 06/05/20 reviewed and showed an EF of >55% along with moderate LVH.   Has not been admitted or been in the ED in the last 6 months.   He presents today for a HF follow-up visit with a chief complaint of gradual weight gain. Has no other complaints and specifically denies any difficulty sleeping, dizziness, abdominal distention, palpitation, pedal edema, chest pain, SOB, cough or fatigue.   Past Medical History:  Diagnosis Date   Alcohol abuse    Ascites    Ascites due to alcoholic cirrhosis (HCC)    CHF (congestive heart failure) ([place])    Cirrhosis ([place])    Diabetes mellitus without complication ([place])    History of congestive heart failure 07/12/2020   History of methicillin resistant staphylococcus aureus (MRSA) 2016   Pneumonia 2021   Portal hypertension (HCC)    Pulmonary embolus (HCC)    SBP (spontaneous bacterial peritonitis) ([place]) 06/07/2020   Spontaneous bacterial peritonitis ([place])    Past Surgical History:  Procedure Laterality Date   CATARACT EXTRACTION     COLONOSCOPY WITH PROPOFOL N/A 01/03/2021   Procedure: COLONOSCOPY WITH PROPOFOL;  Surgeon: Lin Landsman, MD;  Location: [facility] ENDOSCOPY;  Service: Gastroenterology;  Laterality: N/A;   COLONOSCOPY WITH PROPOFOL N/A 09/18/2021   Procedure: COLONOSCOPY WITH PROPOFOL;  Surgeon: Lin Landsman, MD;  Location: [facility] ENDOSCOPY;  Service: Gastroenterology;  Laterality: N/A;   ESOPHAGOGASTRODUODENOSCOPY (EGD) WITH PROPOFOL N/A 01/03/2021   Procedure: ESOPHAGOGASTRODUODENOSCOPY (EGD) WITH PROPOFOL;  Surgeon: Lin Landsman, MD;  Location: [facility] ENDOSCOPY;  Service: Gastroenterology;  Laterality: N/A;   EYE SURGERY      INGUINAL HERNIA REPAIR Left 04/11/2021   Procedure: HERNIA REPAIR INGUINAL ADULT, open, WITH DIAGNOSTIC LAPAROSCOPY, AND ROBOT ASSISTED;  Surgeon: Jules Husbands, MD;  Location: [facility] ORS;  Service: General;  Laterality: Left;  provider requesting 2 hours / 120 minutes for procedure.   PARACENTESIS  2021   TESTICULAR EXPLORATION Left 04/11/2021   Procedure: TESTICULAR EXPLORATION AND REMOVAL OF LEFT TESTICLE;  Surgeon: Billey Co, MD;  Location: [facility] ORS;  Service: Urology;  Laterality: Left;   UMBILICAL HERNIA REPAIR N/A 04/04/2021   Procedure: HERNIA REPAIR UMBILICAL ADULT, open;  Surgeon: Jules Husbands, MD;  Location: [facility] ORS;  Service: General;  Laterality: N/A;   XI ROBOTIC ASSISTED INGUINAL HERNIA REPAIR WITH MESH Left 04/04/2021   Procedure: XI ROBOTIC ASSISTED INGUINAL HERNIA REPAIR WITH MESH;  Surgeon: Jules Husbands, MD;  Location: [facility] ORS;  Service: General;  Laterality: Left;   No family history on file. Social History   Tobacco Use   Smoking status: Every Day    Packs/day: 0.25    Types: Cigarettes   Smokeless tobacco: Never  Substance Use Topics   Alcohol use: Not Currently   No Known Allergies  Prior to Admission medications   Medication Sig Start Date End Date Taking? Authorizing Provider  blood glucose meter kit and supplies KIT Dispense based on patient and insurance preference. Use up to four times daily as directed. (FOR ICD-9 250.00, 250.01). 07/12/20  Yes [place], Chioma E, NP  furosemide (LASIX) 40 MG tablet Take 0.5 tablets (20 mg total) by  mouth as needed for fluid. Patient taking differently: Take 20 mg by mouth daily. 12/09/22 12/09/23 Yes Hallis Meditz, Aura Fey, FNP  metFORMIN (GLUCOPHAGE) 500 MG tablet Take 2 tablets (1,000 mg total) by mouth 2 (two) times daily with a meal. Take 1 tablet twice daily with meals for 2 weeks then increase to 2 tablets twice daily with meals. 01/28/23  Yes Jon Billings, NP  Omega-3 Fatty Acids (FISH OIL) 1000 MG CAPS Take 2  capsules by mouth in the morning and at bedtime.   Yes [provider]  omeprazole (PRILOSEC) 20 MG capsule TAKE 1 CAPSULE(20 MG) BY MOUTH DAILY 02/10/23  Yes Jon Billings, NP  sacubitril-valsartan (ENTRESTO) 24-26 MG Take 1 tablet by mouth 2 (two) times daily. 12/18/22  Yes Darylene Price A, FNP  spironolactone (ALDACTONE) 100 MG tablet Take 1 tablet (100 mg total) by mouth 2 (two) times daily. Patient taking differently: Take 100 mg by mouth 2 (two) times daily. Taking 1 tablet once daily 04/30/22  Yes Ranee Peasley A, FNP  thiamine (VITAMIN B-1) 100 MG tablet TAKE ONE TABLET BY MOUTH EVERY DAY 04/07/22 04/07/23 Yes Marnee Guarneri T, NP   Review of Systems  Constitutional:  Negative for appetite change and fatigue.  HENT:  Negative for congestion, postnasal drip and sore throat.   Respiratory:  Negative for cough, chest tightness and shortness of breath.   Cardiovascular:  Negative for chest pain, palpitations and leg swelling.  Gastrointestinal:  Negative for abdominal distention and abdominal pain.  Endocrine: Negative.   Genitourinary: Negative.   Musculoskeletal:  Negative for back pain, gait problem and neck pain.  Skin: Negative.   Allergic/Immunologic: Negative.   Neurological:  Negative for dizziness and light-headedness.  Hematological:  Negative for adenopathy. Does not bruise/bleed easily.  Psychiatric/Behavioral:  Negative for dysphoric mood and sleep disturbance (sleeping on 2 pillows). The patient is not nervous/anxious.    Vitals:   03/02/23 1216  BP: 112/78  Pulse: 92  SpO2: 99%  Weight: 224 lb 12.8 oz (102 kg)   Wt Readings from Last 3 Encounters:  03/02/23 224 lb 12.8 oz (102 kg)  01/23/23 229 lb 3.2 oz (104 kg)  09/10/22 221 lb 4 oz (100.4 kg)   Lab Results  Component Value Date   CREATININE 0.92 01/23/2023   CREATININE 0.95 09/10/2022   CREATININE 0.92 07/23/2022   Physical Exam Vitals and nursing note reviewed.  Constitutional:       Appearance: Normal appearance.  HENT:     Head: Normocephalic and atraumatic.  Cardiovascular:     Rate and Rhythm: Normal rate and regular rhythm.  Pulmonary:     Effort: Pulmonary effort is normal. No respiratory distress.     Breath sounds: No wheezing or rales.  Abdominal:     General: There is no distension.     Palpations: Abdomen is soft.     Tenderness: There is no abdominal tenderness.  Musculoskeletal:        General: No tenderness.     Cervical back: Normal range of motion.     Right lower leg: No edema.     Left lower leg: No edema.  Skin:    General: Skin is warm and dry.  Neurological:     General: No focal deficit present.     Mental Status: He is alert and oriented to person, place, and time.  Psychiatric:        Mood and Affect: Mood normal.        Behavior: Behavior  normal.        Thought Content: Thought content normal.    Assessment & Plan:  1: Chronic heart failure with preserved ejection fraction without structural changes- - NYHA class I - euvolemic today - weighing daily; reminded to call for an overnight weight gain of >2 pounds or a weekly weight gain of >5 pounds - weight up 11 pounds from last visit here 6 months ago - not adding salt to his food  - entresto 24/'26mg'$  BID - furosemide '20mg'$  daily - spironolactone '100mg'$  daily - number given for [employer] patient assistance as he's been approved through 11/2023 and he didn't realize that he needed to call to request refills - BMP 07/23/22 reviewed and showed sodium 138, potassium 4.1, creatinine 0.92 and GFR 96 - BNP 06/05/20 was 155.9  2: DM- - saw PCP Mathis Dad) 01/23/23 - home glucose this morning was 138 - A1c 07/23/22 was [id number]  3: Alcoholic cirrhosis- - says that he hasn't had any alcohol since June 2021. - saw GI (Vanga) 09/10/22 - abdominal ultrasound done 03/12/22   Medication bottles were reviewed.   Return in 6 months, sooner if needed

## 2023-03-09 ENCOUNTER — Telehealth: Payer: Self-pay

## 2023-03-09 ENCOUNTER — Ambulatory Visit
Admission: RE | Admit: 2023-03-09 | Discharge: 2023-03-09 | Disposition: A | Discharge status: Home / Self Care | Payer: Medicare HMO | Source: Ambulatory Visit | Attending: Gastroenterology | Admitting: Gastroenterology

## 2023-03-09 DIAGNOSIS — K7031 Alcoholic cirrhosis of liver with ascites: Secondary | ICD-10-CM | POA: Diagnosis not present

## 2023-03-09 DIAGNOSIS — K7460 Unspecified cirrhosis of liver: Secondary | ICD-10-CM | POA: Diagnosis not present

## 2023-03-09 NOTE — Telephone Encounter (Signed)
-----   Message from Lin Landsman, MD sent at 03/09/2023  9:30 AM EDT ----- No liver lesions except cirrhosis of liver, repeat US in 6 months  RV

## 2023-03-09 NOTE — Telephone Encounter (Signed)
Tried to call patient but mailbox was full put recall in for 6 months

## 2023-03-16 ENCOUNTER — Ambulatory Visit (INDEPENDENT_AMBULATORY_CARE_PROVIDER_SITE_OTHER): Payer: Medicare HMO | Admitting: Gastroenterology

## 2023-03-16 ENCOUNTER — Encounter: Payer: Self-pay | Admitting: Gastroenterology

## 2023-03-16 VITALS — BP 122/78 | HR 76 | Temp 97.8°F | Ht 69.1 in | Wt 223.1 lb

## 2023-03-16 DIAGNOSIS — K7030 Alcoholic cirrhosis of liver without ascites: Secondary | ICD-10-CM | POA: Diagnosis not present

## 2023-03-16 NOTE — Progress Notes (Signed)
[name], MD [address]  [place]  [place], [place] [address]  Main: [phone]  Fax: [phone]    Gastroenterology Consultation  Referring Provider:     Jon Billings, NP Primary Care Physician:  Jon Billings, NP Primary Gastroenterologist:  Dr. [name] Reason for Consultation:     Cirrhosis of liver        HPI:   Collin Byrd is a 62 y.o. male referred by Dr. Jon Billings, NP  for consultation & management of cirrhosis of liver.  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  He also has history of pneumonia and pulmonary embolism, is currently on anticoagulation.  Patient is followed by hematologist, Dr. Rogue Bussing.  Patient underwent therapeutic paracentesis in June 2021, was diagnosed with SBP as well as fluid analysis consistent with portal hypertension.  He was treated with antibiotics and was discharged home on Lasix as well as spironolactone.  Patient stopped drinking alcohol since June and is adherent to his medications.  He reports doing well since then.  He denies swelling of abdomen, distention of abdomen, swelling of legs.  He denies black stools, rectal bleeding, hematemesis or abdominal pain.  His most recent labs revealed mild normocytic anemia, normal LFTs and renal function.  He is currently on oral iron, thiamine and folate. He is on disability, currently not working  CT chest 11/01/2020 revealed resolution of prior pulmonary embolus   Follow-up visit 09/10/2022 Patient is here for follow-up of cirrhosis.  He has been doing well from cirrhosis standpoint.  He continues to take spironolactone 100 mg daily and furosemide 40 mg daily.  He has gained about 10 pounds.  He denies any abdominal distention, abdominal discomfort, melena, rectal bleeding, nausea or vomiting.  He is leading sedentary lifestyle, not exercising or walking, does consume red meat regularly.  Patient does not have  any GI concerns today  Follow-up visit 03/16/2023 Patient is here for follow-up of cirrhosis.  His cirrhosis is well compensated.  He has been doing well.  He denies any abdominal distention, swelling, swelling of legs.  He denies any symptoms of melena, rectal bleeding.  He is taking spironolactone 50 mg and furosemide 20 mg as advised during last visit.  His hemoglobin A1c increased to 8.5 based on the labs from January.  He restarted metformin 1000mg  twice daily.  NSAIDs: None  Antiplts/Anticoagulants/Anti thrombotics: Completed the treatment for Xarelto for history of PE after 6 months of anticoagulation  GI Procedures:  EGD and colonoscopy 01/03/2021 - Normal duodenal bulb and second portion of the duodenum. - Portal hypertensive gastropathy. Biopsied. - A single gastric polyp. Biopsied. Clip (MR conditional) was placed. - Small (< 5 mm) esophageal varices. - Esophagogastric landmarks identified. - Normal proximal esophagus, mid esophagus and gastroesophageal junction.  Procedure aborted due to large left inguinal hernia, advanced to transverse colon only The perianal and digital rectal examinations were normal. Pertinent negatives include normal sphincter tone and no palpable rectal lesions. Two sessile polyps were found in the descending colon and transverse colon. The polyps were 3 to 5 mm in size. These polyps were removed with a cold snare. Resection and retrieval were complete. Estimated blood loss: none. Non-bleeding external and internal hemorrhoids were found during retroflexion. The hemorrhoids were large.  DIAGNOSIS:  A. STOMACH; COLD BIOPSY:  - MODERATE CHRONIC ACTIVE GASTRITIS.  - ANTRAL MUCOSA WITH FOCAL INTESTINAL METAPLASIA.  - SIDEROSIS.  - SEE COMMENT.  - NEGATIVE FOR  H.PYLORI, DYSPLASIA AND MALIGNANCY.   B.  STOMACH POLYPS; COLD BIOPSY:  - INFLAMED POLYPOID FRAGMENT OF MUCOSA WITH REACTIVE FOVEOLAR  HYPERPLASIA.  - NEGATIVE FOR H.PYLORI, CMV, DYSPLASIA AND  MALIGNANCY.   C.  COLON POLYP X3, DESCENDING; COLD SNARE:  - TUBULAR ADENOMA (MULTIPLE FRAGMENTS).  - NEGATIVE FOR HIGH-GRADE DYSPLASIA AND MALIGNANCY.   Colonoscopy 09/18/2021 - Five 3 to 5 mm polyps in the descending colon, in the ascending colon and in the cecum, removed with a cold snare. Resected and retrieved. - Non-bleeding external and internal hemorrhoids. - Diverticulosis in the sigmoid colon and in the right colon. DIAGNOSIS:  A. COLON POLYPS X2, CECUM; COLD SNARE:  - MULTIPLE FRAGMENTS OF TUBULAR ADENOMAS.  - NEGATIVE FOR HIGH-GRADE DYSPLASIA AND MALIGNANCY.   B. COLON POLYP, ASCENDING; COLD SNARE:  - TUBULAR ADENOMA.  - NEGATIVE FOR HIGH-GRADE DYSPLASIA AND MALIGNANCY.   C. COLON POLYPS X2, DESCENDING; COLD SNARE:  - TUBULAR ADENOMA.  - NEGATIVE FOR HIGH-GRADE DYSPLASIA AND MALIGNANCY.   He denies family history of GI malignancy, liver cancer  Past Medical History:  Diagnosis Date   Alcohol abuse    Ascites    Ascites due to alcoholic cirrhosis (HCC)    CHF (congestive heart failure) ([place])    Cirrhosis ([place])    Diabetes mellitus without complication ([place])    History of congestive heart failure 07/12/2020   History of methicillin resistant staphylococcus aureus (MRSA) 2016   Pneumonia 2021   Portal hypertension (HCC)    Pulmonary embolus (HCC)    SBP (spontaneous bacterial peritonitis) ([place]) 06/07/2020   Spontaneous bacterial peritonitis ([place])     Past Surgical History:  Procedure Laterality Date   CATARACT EXTRACTION     COLONOSCOPY WITH PROPOFOL N/A 01/03/2021   Procedure: COLONOSCOPY WITH PROPOFOL;  Surgeon: Lin Landsman, MD;  Location: [facility] ENDOSCOPY;  Service: Gastroenterology;  Laterality: N/A;   COLONOSCOPY WITH PROPOFOL N/A 09/18/2021   Procedure: COLONOSCOPY WITH PROPOFOL;  Surgeon: Lin Landsman, MD;  Location[facility] ENDOSCOPY;  Service: Gastroenterology;  Laterality: N/A;   ESOPHAGOGASTRODUODENOSCOPY (EGD) WITH PROPOFOL N/A 01/03/2021    Procedure: ESOPHAGOGASTRODUODENOSCOPY (EGD) WITH PROPOFOL;  Surgeon: Lin Landsman, MD;  Location: [facility] ENDOSCOPY;  Service: Gastroenterology;  Laterality: N/A;   EYE SURGERY     INGUINAL HERNIA REPAIR Left 04/11/2021   Procedure: HERNIA REPAIR INGUINAL ADULT, open, WITH DIAGNOSTIC LAPAROSCOPY, AND ROBOT ASSISTED;  Surgeon: Jules Husbands, MD;  Location: [facility] ORS;  Service: General;  Laterality: Left;  provider requesting 2 hours / 120 minutes for procedure.   PARACENTESIS  2021   TESTICULAR EXPLORATION Left 04/11/2021   Procedure: TESTICULAR EXPLORATION AND REMOVAL OF LEFT TESTICLE;  Surgeon: Billey Co, MD;  Location: [facility] ORS;  Service: Urology;  Laterality: Left;   UMBILICAL HERNIA REPAIR N/A 04/04/2021   Procedure: HERNIA REPAIR UMBILICAL ADULT, open;  Surgeon: Jules Husbands, MD;  Location: [facility] ORS;  Service: General;  Laterality: N/A;   XI ROBOTIC ASSISTED INGUINAL HERNIA REPAIR WITH MESH Left 04/04/2021   Procedure: XI ROBOTIC ASSISTED INGUINAL HERNIA REPAIR WITH MESH;  Surgeon: Jules Husbands, MD;  Location: [facility] ORS;  Service: General;  Laterality: Left;    Current Outpatient Medications:    blood glucose meter kit and supplies KIT, Dispense based on patient and insurance preference. Use up to four times daily as directed. (FOR ICD-9 250.00, 250.01)., Disp: 1 each, Rfl: 0   furosemide (LASIX) 40 MG tablet, Take 0.5 tablets (20 mg total) by mouth as needed  for fluid. (Patient taking differently: Take 20 mg by mouth daily.), Disp: 15 tablet, Rfl: 5   metFORMIN (GLUCOPHAGE) 500 MG tablet, Take 2 tablets (1,000 mg total) by mouth 2 (two) times daily with a meal. Take 1 tablet twice daily with meals for 2 weeks then increase to 2 tablets twice daily with meals., Disp: 360 tablet, Rfl: 1   Omega-3 Fatty Acids (FISH OIL) 1000 MG CAPS, Take 2 capsules by mouth in the morning and at bedtime., Disp: , Rfl:    omeprazole (PRILOSEC) 20 MG capsule, TAKE 1 CAPSULE(20 MG) BY MOUTH DAILY, Disp: 90  capsule, Rfl: 0   sacubitril-valsartan (ENTRESTO) 24-26 MG, Take 1 tablet by mouth 2 (two) times daily., Disp: 180 tablet, Rfl: 3   spironolactone (ALDACTONE) 100 MG tablet, Take 1 tablet (100 mg total) by mouth 2 (two) times daily. (Patient taking differently: Take 100 mg by mouth 2 (two) times daily. Taking 1 tablet once daily), Disp: 60 tablet, Rfl: 5   thiamine (VITAMIN B-1) 100 MG tablet, TAKE ONE TABLET BY MOUTH EVERY DAY, Disp: 90 tablet, Rfl: 1  History reviewed. No pertinent family history.   Social History   Tobacco Use   Smoking status: Every Day    Packs/day: .25    Types: Cigarettes   Smokeless tobacco: Never  Vaping Use   Vaping Use: Never used  Substance Use Topics   Alcohol use: Not Currently   Drug use: Never    Allergies as of 03/16/2023   (No Known Allergies)    Review of Systems:    All systems reviewed and negative except where noted in HPI.   Physical Exam:  BP 122/78 (BP Location: Left Arm, Patient Position: Sitting, Cuff Size: Normal)   Pulse 76   Temp 97.8 F (36.6 C) (Oral)   Ht 5' 9.1" (1.755 m)   Wt 223 lb 2 oz (101.2 kg)   BMI 32.85 kg/m  No LMP for male patient.  General:   Alert,  Well-developed, well-nourished, pleasant and cooperative in NAD Head:  Normocephalic and atraumatic. Eyes:  Sclera clear, no icterus.   Conjunctiva pink.  Congested right eye Ears:  Normal auditory acuity. Nose:  No deformity, discharge, or lesions. Mouth:  No deformity or lesions,oropharynx pink & moist. Neck:  Supple; no masses or thyromegaly. Lungs:  Respirations even and unlabored.  Clear throughout to auscultation.   No wheezes, crackles, or rhonchi. No acute distress. Heart:  Regular rate and rhythm; no murmurs, clicks, rubs, or gallops. Abdomen:  Normal bowel sounds. Soft, non-tender and non-distended without masses, hepatosplenomegaly, large left inguinal hernia noted.  No guarding or rebound tenderness.   Rectal: Not performed Msk:  Symmetrical  without gross deformities. Good, equal movement & strength bilaterally. Pulses:  Normal pulses noted. Extremities:  No clubbing or edema.  No cyanosis. Neurologic:  Alert and oriented x3;  grossly normal neurologically. Skin:  Intact without significant lesions or rashes. No jaundice. Psych:  Alert and cooperative. Normal mood and affect.  Imaging Studies: Reviewed  Assessment and Plan:   Collin Byrd is a 62 y.o. Hispanic male with diabetes, decompensated alcoholic cirrhosis with ascites, history of SBP, pneumonia and pulmonary embolism s/p 6 months of anticoagulation on Xarelto is seen in consultation for follow-up of cirrhosis  Alcoholic cirrhosis of liver, child Pugh class B, low meld, currently well compensated Viral hepatitis panel negative, s/p Twinrix vaccine x3 doses Iron deficiency anemia has resolved, s/p iron replacement therapy Volume overload: Currently euvolemic.  He had history of  ascites s/p therapeutic paracentesis in 6/21, consistent with portal hypertension Continue spironolactone to 50 mg daily and continue Lasix 20 mg daily Continue low-sodium diet No evidence of esophageal or gastric varices HCC screening: AFP levels normal and CT did not reveal liver lesions in 6/21 and 4/22 right upper quadrant ultrasound every 6 months, up-to-date PSE: None Continue to remain abstinent from alcohol use Strongly advised patient to follow healthy diet, healthy eating habits and exercise, not to gain weight  Moderate chronic active gastritis H. pylori IgG was positive, s/p treatment for H. pylori infection with triple therapy in 02/2021   Follow up in 6 months   [name], MD

## 2023-03-16 NOTE — Patient Instructions (Addendum)
Take Melatonin 5mg  every night for sleep and you can get this over the counter.

## 2023-04-22 ENCOUNTER — Encounter: Payer: Self-pay | Admitting: Nurse Practitioner

## 2023-04-22 ENCOUNTER — Ambulatory Visit (INDEPENDENT_AMBULATORY_CARE_PROVIDER_SITE_OTHER): Payer: Medicare HMO | Admitting: Nurse Practitioner

## 2023-04-22 VITALS — BP 107/68 | HR 80 | Temp 97.9°F | Wt 223.2 lb

## 2023-04-22 DIAGNOSIS — Z720 Tobacco use: Secondary | ICD-10-CM

## 2023-04-22 DIAGNOSIS — E781 Pure hyperglyceridemia: Secondary | ICD-10-CM | POA: Diagnosis not present

## 2023-04-22 DIAGNOSIS — I5032 Chronic diastolic (congestive) heart failure: Secondary | ICD-10-CM | POA: Diagnosis not present

## 2023-04-22 DIAGNOSIS — Z7984 Long term (current) use of oral hypoglycemic drugs: Secondary | ICD-10-CM

## 2023-04-22 DIAGNOSIS — E1165 Type 2 diabetes mellitus with hyperglycemia: Secondary | ICD-10-CM | POA: Diagnosis not present

## 2023-04-22 DIAGNOSIS — K7030 Alcoholic cirrhosis of liver without ascites: Secondary | ICD-10-CM | POA: Diagnosis not present

## 2023-04-22 DIAGNOSIS — Z0000 Encounter for general adult medical examination without abnormal findings: Secondary | ICD-10-CM | POA: Diagnosis not present

## 2023-04-22 DIAGNOSIS — Z7189 Other specified counseling: Secondary | ICD-10-CM | POA: Diagnosis not present

## 2023-04-22 LAB — MICROSCOPIC EXAMINATION: Bacteria, UA: NONE SEEN

## 2023-04-22 LAB — URINALYSIS, ROUTINE W REFLEX MICROSCOPIC
Bilirubin, UA: NEGATIVE
Glucose, UA: NEGATIVE
Ketones, UA: NEGATIVE
Nitrite, UA: NEGATIVE
Protein,UA: NEGATIVE
Specific Gravity, UA: 1.015 (ref 1.005–1.030)
Urobilinogen, Ur: 1 mg/dL (ref 0.2–1.0)
pH, UA: 5.5 (ref 5.0–7.5)

## 2023-04-22 NOTE — Assessment & Plan Note (Signed)
Chronic.  Controlled.  Continue with current medication regimen.  Refrain from alcohol use.  Continue with Thiamine.  Followed by GI.  Reviewed recent note from GI.  No acute concerns at this time.  No ascites present on exam. Continue to collaborate with specialist.  Labs ordered today.  Return to clinic in 6 months for reevaluation.  Call sooner if concerns arise.

## 2023-04-22 NOTE — Progress Notes (Signed)
BP 107/68   Pulse 80   Temp 97.9 F (36.6 C) (Oral)   Wt 223 lb 3.2 oz (101.2 kg)   SpO2 98%   BMI 32.87 kg/m    Subjective:    Patient ID: Collin Byrd, male    DOB: 1961-02-24, 62 y.o.   MRN: [id number]  HPI: Collin Byrd is a 62 y.o. male presenting on 04/22/2023 for comprehensive medical examination. Current medical complaints include:none  He currently lives with: Interim Problems from his last visit: no  DIABETES Patient has his eye exam scheduled for next month. Patient is on Metformin  BID.  Hypoglycemic episodes:no Polydipsia/polyuria: no Visual disturbance: no Chest pain: no Paresthesias: no Glucose Monitoring: yes             Accucheck frequency:  once weekly             Fasting glucose: 145             Post prandial:             Evening:             Before meals: Taking Insulin?: no             Long acting insulin:             Short acting insulin: Blood Pressure Monitoring: daily Retinal Examination:  states he had it done in [facility] Foot Exam: Up to Date Diabetic Education: Not Completed Pneumovax: Up to Date Influenza: Up to Date Aspirin: no   HYPERTENSION / HYPERLIPIDEMIA- Doing well.  Follows up with HF clinic.  Satisfied with current treatment? no Duration of hypertension: years BP monitoring frequency: not checking BP range:  BP medication side effects: no Past BP meds:  Entresto and spironalactone Duration of hyperlipidemia: years Cholesterol medication side effects: no Cholesterol supplements: none Past cholesterol medications: none Medication compliance: excellent compliance Aspirin: no Recent stressors: no Recurrent headaches: no Visual changes: no Palpitations: no Dyspnea: no Chest pain: no Lower extremity edema: no Dizzy/lightheaded: no   Seeing Dr. Allegra Lai for Cirrohsis.  Doing well per recent note.     Functional Status Survey: Is the patient deaf or have difficulty hearing?: No Does the patient have  difficulty seeing, even when wearing glasses/contacts?: No Does the patient have difficulty concentrating, remembering, or making decisions?: No Does the patient have difficulty walking or climbing stairs?: No Does the patient have difficulty dressing or bathing?: No Does the patient have difficulty doing errands alone such as visiting a doctor's office or shopping?: No  FALL RISK:    04/22/2023    1:14 PM 01/23/2023    9:19 AM 08/26/2022   12:31 PM 07/17/2022    1:35 PM 06/11/2022   11:52 AM  Fall Risk   Falls in the past year? 0 0 0 0 0  Number falls in past yr: 0 0 0 0 0  Injury with Fall? 0 0 0 0 0  Risk for fall due to : No Fall Risks No Fall Risks No Fall Risks No Fall Risks No Fall Risks  Follow up Falls evaluation completed Falls evaluation completed Falls evaluation completed;Falls prevention discussed Falls evaluation completed;Falls prevention discussed Falls evaluation completed    Depression Screen    01/23/2023    9:19 AM 08/26/2022   12:32 PM 07/17/2022    1:35 PM 06/11/2022   11:52 AM 01/23/2022    1:04 PM  Depression screen PHQ 2/9  Decreased Interest 0 0 0 0   Down,  Depressed, Hopeless 0 1 1 1  0  PHQ - 2 Score 0 1 1 1  0  Altered sleeping 0    0  Tired, decreased energy 0    0  Change in appetite 0    0  Feeling bad or failure about yourself  0    0  Trouble concentrating 0    0  Moving slowly or fidgety/restless 0    0  Suicidal thoughts 0    0  PHQ-9 Score 0    0  Difficult doing work/chores Not difficult at all    Not difficult at all    Advanced Directives Does patient have a HCPOA?    yes If yes, name and contact information: Chrissie Noa- his brother Does patient have a living will or MOST form?  yes  Past Medical History:  Past Medical History:  Diagnosis Date   Alcohol abuse    Ascites    Ascites due to alcoholic cirrhosis    CHF (congestive heart failure)    Cirrhosis    Diabetes mellitus without complication    History of congestive heart failure  07/12/2020   History of methicillin resistant staphylococcus aureus (MRSA) 2016   Pneumonia 2021   Portal hypertension    Pulmonary embolus    SBP (spontaneous bacterial peritonitis) 06/07/2020   Spontaneous bacterial peritonitis     Surgical History:  Past Surgical History:  Procedure Laterality Date   CATARACT EXTRACTION     COLONOSCOPY WITH PROPOFOL N/A 01/03/2021   Procedure: COLONOSCOPY WITH PROPOFOL;  Surgeon: Toney Reil, MD;  Location: [facility] ENDOSCOPY;  Service: Gastroenterology;  Laterality: N/A;   COLONOSCOPY WITH PROPOFOL N/A 09/18/2021   Procedure: COLONOSCOPY WITH PROPOFOL;  Surgeon: Toney Reil, MD;  Location: [facility] ENDOSCOPY;  Service: Gastroenterology;  Laterality: N/A;   ESOPHAGOGASTRODUODENOSCOPY (EGD) WITH PROPOFOL N/A 01/03/2021   Procedure: ESOPHAGOGASTRODUODENOSCOPY (EGD) WITH PROPOFOL;  Surgeon: Toney Reil, MD;  Location: [facility] ENDOSCOPY;  Service: Gastroenterology;  Laterality: N/A;   EYE SURGERY     INGUINAL HERNIA REPAIR Left 04/11/2021   Procedure: HERNIA REPAIR INGUINAL ADULT, open, WITH DIAGNOSTIC LAPAROSCOPY, AND ROBOT ASSISTED;  Surgeon: Leafy Ro, MD;  Location: [facility] ORS;  Service: General;  Laterality: Left;  provider requesting 2 hours / 120 minutes for procedure.   PARACENTESIS  2021   TESTICULAR EXPLORATION Left 04/11/2021   Procedure: TESTICULAR EXPLORATION AND REMOVAL OF LEFT TESTICLE;  Surgeon: Sondra Come, MD;  Location: [facility] ORS;  Service: Urology;  Laterality: Left;   UMBILICAL HERNIA REPAIR N/A 04/04/2021   Procedure: HERNIA REPAIR UMBILICAL ADULT, open;  Surgeon: Leafy Ro, MD;  Location: [facility] ORS;  Service: General;  Laterality: N/A;   XI ROBOTIC ASSISTED INGUINAL HERNIA REPAIR WITH MESH Left 04/04/2021   Procedure: XI ROBOTIC ASSISTED INGUINAL HERNIA REPAIR WITH MESH;  Surgeon: Leafy Ro, MD;  Location: [facility] ORS;  Service: General;  Laterality: Left;    Medications:  Current Outpatient Medications on File Prior  to Visit  Medication Sig   blood glucose meter kit and supplies KIT Dispense based on patient and insurance preference. Use up to four times daily as directed. (FOR ICD-9 250.00, 250.01).   furosemide (LASIX) 40 MG tablet Take 0.5 tablets (20 mg total) by mouth as needed for fluid. (Patient taking differently: Take 20 mg by mouth daily.)   metFORMIN (GLUCOPHAGE) 500 MG tablet Take 2 tablets (1,000 mg total) by mouth 2 (two) times daily with a meal. Take 1 tablet twice daily with  meals for 2 weeks then increase to 2 tablets twice daily with meals.   Omega-3 Fatty Acids (FISH OIL) 1000 MG CAPS Take 2 capsules by mouth in the morning and at bedtime.   omeprazole (PRILOSEC) 20 MG capsule TAKE 1 CAPSULE(20 MG) BY MOUTH DAILY   sacubitril-valsartan (ENTRESTO) 24-26 MG Take 1 tablet by mouth 2 (two) times daily.   spironolactone (ALDACTONE) 100 MG tablet Take 1 tablet (100 mg total) by mouth 2 (two) times daily. (Patient taking differently: Take 100 mg by mouth 2 (two) times daily. Taking 1 tablet once daily)   No current facility-administered medications on file prior to visit.    Allergies:  No Known Allergies  Social History:  Social History   Socioeconomic History   Marital status: Media planner    Spouse name: Not on file   Number of children: Not on file   Years of education: Not on file   Highest education level: Not on file  Occupational History   Not on file  Tobacco Use   Smoking status: Every Day    Packs/day: .25    Types: Cigarettes   Smokeless tobacco: Never  Vaping Use   Vaping Use: Never used  Substance and Sexual Activity   Alcohol use: Not Currently   Drug use: Never   Sexual activity: Not Currently  Other Topics Concern   Not on file  Social History Narrative   No working now; used to work housekeeping; 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 Smoking- 1-3 cig/day. Lives in [place]; with wife. No children.    Social Determinants of Health   Financial Resource  Strain: Not on file  Food Insecurity: Not on file  Transportation Needs: Not on file  Physical Activity: Not on file  Stress: Not on file  Social Connections: Not on file  Intimate Partner Violence: Not on file   Social History   Tobacco Use  Smoking Status Every Day   Packs/day: .25   Types: Cigarettes  Smokeless Tobacco Never   Social History   Substance and Sexual Activity  Alcohol Use Not Currently    Family History:  History reviewed. No pertinent family history.  Past medical history, surgical history, medications, allergies, family history and social history reviewed with patient today and changes made to appropriate areas of the chart.   Review of Systems  HENT:         Denies vision changes.  Eyes:  Negative for blurred vision and double vision.  Respiratory:  Negative for shortness of breath.   Cardiovascular:  Negative for chest pain, palpitations and leg swelling.  Neurological:  Negative for dizziness, tingling and headaches.  Endo/Heme/Allergies:  Negative for polydipsia.       Denies Polyuria   All other ROS negative except what is listed above and in the HPI.      Objective:    BP 107/68   Pulse 80   Temp 97.9 F (36.6 C) (Oral)   Wt 223 lb 3.2 oz (101.2 kg)   SpO2 98%   BMI 32.87 kg/m   Wt Readings from Last 3 Encounters:  04/22/23 223 lb 3.2 oz (101.2 kg)  03/16/23 223 lb 2 oz (101.2 kg)  03/02/23 224 lb 12.8 oz (102 kg)    Hearing Screening   500Hz  1000Hz  2000Hz  4000Hz   Right ear 20 20 20 20   Left ear 20 20 20 20    Vision Screening   Right eye Left eye Both eyes  Without correction 20/100 20/50 20/70  With  correction       Physical Exam Vitals and nursing note reviewed.  Constitutional:      General: He is not in acute distress.    Appearance: Normal appearance. He is obese. He is not ill-appearing, toxic-appearing or diaphoretic.  HENT:     Head: Normocephalic.     Right Ear: Tympanic membrane, ear canal and external ear  normal.     Left Ear: Tympanic membrane, ear canal and external ear normal.     Nose: Nose normal. No congestion or rhinorrhea.     Mouth/Throat:     Mouth: Mucous membranes are moist.  Eyes:     General:        Right eye: No discharge.        Left eye: No discharge.     Extraocular Movements: Extraocular movements intact.     Conjunctiva/sclera: Conjunctivae normal.     Pupils: Pupils are equal, round, and reactive to light.  Cardiovascular:     Rate and Rhythm: Normal rate and regular rhythm.     Heart sounds: No murmur heard. Pulmonary:     Effort: Pulmonary effort is normal. No respiratory distress.     Breath sounds: Normal breath sounds. No wheezing, rhonchi or rales.  Abdominal:     General: Abdomen is flat. Bowel sounds are normal. There is no distension.     Palpations: Abdomen is soft.     Tenderness: There is no abdominal tenderness. There is no guarding.  Musculoskeletal:     Cervical back: Normal range of motion and neck supple.  Skin:    General: Skin is warm and dry.     Capillary Refill: Capillary refill takes less than 2 seconds.  Neurological:     General: No focal deficit present.     Mental Status: He is alert and oriented to person, place, and time.     Cranial Nerves: No cranial nerve deficit.     Motor: No weakness.     Deep Tendon Reflexes: Reflexes normal.  Psychiatric:        Mood and Affect: Mood normal.        Behavior: Behavior normal.        Thought Content: Thought content normal.        Judgment: Judgment normal.        04/22/2023    1:16 PM  6CIT Screen  What Year? 0 points  What month? 0 points  What time? 3 points  Count back from 20 0 points  Months in reverse 0 points  Repeat phrase 8 points  Total Score 11 points    Results for orders placed or performed in visit on 01/23/23  Comp Met (CMET)  Result Value Ref Range   Glucose 190 (H) 70 - 99 mg/dL   BUN 13 6 - 24 mg/dL   Creatinine, Ser 1.61 0.76 - 1.27 mg/dL   eGFR 96  >09 UE/AVW/0.98   BUN/Creatinine Ratio 14 9 - 20   Sodium 137 134 - 144 mmol/L   Potassium 4.1 3.5 - 5.2 mmol/L   Chloride 99 96 - 106 mmol/L   CO2 22 20 - 29 mmol/L   Calcium 9.3 8.7 - 10.2 mg/dL   Total Protein 7.7 6.0 - 8.5 g/dL   Albumin 4.3 3.8 - 4.9 g/dL   Globulin, Total 3.4 1.5 - 4.5 g/dL   Albumin/Globulin Ratio 1.3 1.2 - 2.2   Bilirubin Total 1.2 0.0 - 1.2 mg/dL   Alkaline Phosphatase 75 44 - 121  IU/L   AST 14 0 - 40 IU/L   ALT 18 0 - 44 IU/L  Lipid Profile  Result Value Ref Range   Cholesterol, Total 153 100 - 199 mg/dL   Triglycerides 161 0 - 149 mg/dL   HDL 37 (L) >09 mg/dL   VLDL Cholesterol Cal 21 5 - 40 mg/dL   LDL Chol Calc (NIH) 95 0 - 99 mg/dL   Chol/HDL Ratio 4.1 0.0 - 5.0 ratio  HgB A1c  Result Value Ref Range   Hgb A1c MFr Bld 8.5 (H) 4.8 - 5.6 %   Est. average glucose Bld gHb Est-mCnc 197 mg/dL      Assessment & Plan:   Problem List Items Addressed This Visit       Cardiovascular and Mediastinum   Chronic diastolic heart failure    - NYHA class I - euvolemic today - weighing daily; reminded to call for an overnight weight gain of >2 pounds or a weekly weight gain of >5 pounds - weight up 11 pounds from last visit here 6 months ago - not adding salt to his food  - entresto 24/26mg  BID - furosemide  daily - spironolactone  daily        Digestive   Hepatic cirrhosis    Chronic.  Controlled.  Continue with current medication regimen.  Refrain from alcohol use.  Continue with Thiamine.  Followed by GI.  Reviewed recent note from GI.  No acute concerns at this time.  No ascites present on exam. Continue to collaborate with specialist.  Labs ordered today.  Return to clinic in 6 months for reevaluation.  Call sooner if concerns arise.           Endocrine   Type 2 diabetes mellitus with hyperglycemia, without long-term current use of insulin    Chronic.  Controlled with Metformin  BID.  Blood sugar today was 145.  Last A1c was 8.5%  in January.  Microlabumin checked today.  Needs updated eye exam- has it scheduled for May.  Labs ordered today.  Return to clinic in 6 months for reevaluation.  Call sooner if concerns arise.        Relevant Orders   Microalbumin, Urine Waived     Other   Hypertriglyceridemia    Labs ordered at visit today.  Will make recommendations based on lab results.        Advanced care planning/counseling discussion    A voluntary discussion about advance care planning including the explanation and discussion of advance directives was extensively discussed  with the patient for 5 minutes with patient.  Explanation about the health care proxy and Living will was reviewed and packet with forms with explanation of how to fill them out was given.  During this discussion, the patient was able to identify a health care proxy as his brother and does not wish to make changes to his living will at this time. a      Other Visit Diagnoses     Welcome to Medicare preventive visit    -  Primary   Relevant Orders   EKG 12-Lead (Completed)   US [employer]ORT[employer] MEDICARE SCREENING   Annual physical exam       Health maintenance reviewed during visit today.  Labs ordered.  Vaccines up to date.  Colonoscopy up to date.   Relevant Orders   TSH   PSA   Lipid panel   CBC with Differential/Platelet   Comprehensive metabolic panel   Urinalysis, Routine w  reflex microscopic   HgB A1c   Tobacco use       Relevant Orders   Ambulatory Referral Lung Cancer Screening [facility]        Preventative Services:  Health Risk Assessment and Personalized Prevention Plan: up to date Bone Mass Measurements: NA CVD Screening: Up to date Colon Cancer Screening: Up to date Depression Screening: Up to date Diabetes Screening: Up to date Glaucoma Screening: Up to date Hepatitis B vaccine: Up to date Hepatitis C screening: Up to date HIV Screening:Up to date Flu Vaccine: Up to date Lung cancer Screening: Ordered  today Obesity Screening: Up to date Pneumonia Vaccines (2): up to date STI Screening: NA PSA screening: Up to date  Discussed aspirin prophylaxis for myocardial infarction prevention and decision was it was not indicated  LABORATORY TESTING:  Health maintenance labs ordered today as discussed above.   The natural history of prostate cancer and ongoing controversy regarding screening and potential treatment outcomes of prostate cancer has been discussed with the patient. The meaning of a false positive PSA and a false negative PSA has been discussed. He indicates understanding of the limitations of this screening test and wishes to proceed with screening PSA testing.   IMMUNIZATIONS:   - Tdap: Tetanus vaccination status reviewed: last tetanus booster within 10 years. - Influenza: Postponed to flu season - Pneumovax: Up to date - Prevnar: Not applicable - Zostavax vaccine: Not applicable  SCREENING: - Colonoscopy: Up to date  Discussed with patient purpose of the colonoscopy is to detect colon cancer at curable precancerous or early stages   - AAA Screening: Ordered today -Hearing Test: Not applicable  -Spirometry: Not applicable   PATIENT COUNSELING:    Sexuality: Discussed sexually transmitted diseases, partner selection, use of condoms, avoidance of unintended pregnancy  and contraceptive alternatives.   Advised to avoid cigarette smoking.  I discussed with the patient that most people either abstain from alcohol or drink within safe limits (<=14/week and <=4 drinks/occasion for males, <=7/weeks and <= 3 drinks/occasion for females) and that the risk for alcohol disorders and other health effects rises proportionally with the number of drinks per week and how often a drinker exceeds daily limits.  Discussed cessation/primary prevention of drug use and availability of treatment for abuse.   Diet: Encouraged to adjust caloric intake to maintain  or achieve ideal body weight, to  reduce intake of dietary saturated fat and total fat, to limit sodium intake by avoiding high sodium foods and not adding table salt, and to maintain adequate dietary potassium and calcium preferably from fresh fruits, vegetables, and low-fat dairy products.    stressed the importance of regular exercise  Injury prevention: Discussed safety belts, safety helmets, smoke detector, smoking near bedding or upholstery.   Dental health: Discussed importance of regular tooth brushing, flossing, and dental visits.   Follow up plan: NEXT PREVENTATIVE PHYSICAL DUE IN 1 YEAR. Return in about 6 months (around 10/22/2023) for HTN, HLD, DM2 FU.

## 2023-04-22 NOTE — Assessment & Plan Note (Signed)
A voluntary discussion about advance care planning including the explanation and discussion of advance directives was extensively discussed  with the patient for 5 minutes with patient.  Explanation about the health care proxy and Living will was reviewed and packet with forms with explanation of how to fill them out was given.  During this discussion, the patient was able to identify a health care proxy as his brother and does not wish to make changes to his living will at this time. a

## 2023-04-22 NOTE — Assessment & Plan Note (Signed)
-   NYHA class I - euvolemic today - weighing daily; reminded to call for an overnight weight gain of >2 pounds or a weekly weight gain of >5 pounds - weight up 11 pounds from last visit here 6 months ago - not adding salt to his food  - entresto 24/26mg  BID - furosemide  daily - spironolactone  daily

## 2023-04-22 NOTE — Assessment & Plan Note (Signed)
Chronic.  Controlled with Metformin  BID.  Blood sugar today was 145.  Last A1c was 8.5% in January.  Microlabumin checked today.  Needs updated eye exam- has it scheduled for May.  Labs ordered today.  Return to clinic in 6 months for reevaluation.  Call sooner if concerns arise.

## 2023-04-22 NOTE — Assessment & Plan Note (Signed)
Labs ordered at visit today.  Will make recommendations based on lab results.   

## 2023-04-22 NOTE — Progress Notes (Signed)
Results reviewed with patient during visit.

## 2023-04-23 ENCOUNTER — Ambulatory Visit: Payer: Medicare HMO | Admitting: Nurse Practitioner

## 2023-04-23 LAB — LIPID PANEL
Chol/HDL Ratio: 3.9 ratio (ref 0.0–5.0)
Cholesterol, Total: 146 mg/dL (ref 100–199)
HDL: 37 mg/dL — ABNORMAL LOW (ref >39)
LDL Chol Calc (NIH): 87 mg/dL (ref 0–99)
Triglycerides: 119 mg/dL (ref 0–149)
VLDL Cholesterol Cal: 22 mg/dL (ref 5–40)

## 2023-04-23 LAB — CBC WITH DIFFERENTIAL/PLATELET
Basophils Absolute: 0 10*3/uL (ref 0.0–0.2)
Basos: 1 %
EOS (ABSOLUTE): 0.1 10*3/uL (ref 0.0–0.4)
Eos: 1 %
Hematocrit: 35.7 % — ABNORMAL LOW (ref 37.5–51.0)
Hemoglobin: 11.1 g/dL — ABNORMAL LOW (ref 13.0–17.7)
Immature Grans (Abs): 0.1 10*3/uL (ref 0.0–0.1)
Immature Granulocytes: 1 %
Lymphocytes Absolute: 1.2 10*3/uL (ref 0.7–3.1)
Lymphs: 15 %
MCH: 26.2 pg — ABNORMAL LOW (ref 26.6–33.0)
MCHC: 31.1 g/dL — ABNORMAL LOW (ref 31.5–35.7)
MCV: 84 fL (ref 79–97)
Monocytes Absolute: 0.6 10*3/uL (ref 0.1–0.9)
Monocytes: 7 %
Neutrophils Absolute: 5.9 10*3/uL (ref 1.4–7.0)
Neutrophils: 75 %
Platelets: 334 10*3/uL (ref 150–450)
RBC: 4.24 x10E6/uL (ref 4.14–5.80)
RDW: 14.7 % (ref 11.6–15.4)
WBC: 7.8 10*3/uL (ref 3.4–10.8)

## 2023-04-23 LAB — COMPREHENSIVE METABOLIC PANEL
ALT: 12 IU/L (ref 0–44)
AST: 14 IU/L (ref 0–40)
Albumin/Globulin Ratio: 1.2 (ref 1.2–2.2)
Albumin: 4.1 g/dL (ref 3.8–4.9)
Alkaline Phosphatase: 63 IU/L (ref 44–121)
BUN/Creatinine Ratio: 13 (ref 10–24)
BUN: 11 mg/dL (ref 8–27)
Bilirubin Total: 0.7 mg/dL (ref 0.0–1.2)
CO2: 22 mmol/L (ref 20–29)
Calcium: 9.5 mg/dL (ref 8.6–10.2)
Chloride: 99 mmol/L (ref 96–106)
Creatinine, Ser: 0.82 mg/dL (ref 0.76–1.27)
Globulin, Total: 3.5 g/dL (ref 1.5–4.5)
Glucose: 93 mg/dL (ref 70–99)
Potassium: 4 mmol/L (ref 3.5–5.2)
Sodium: 137 mmol/L (ref 134–144)
Total Protein: 7.6 g/dL (ref 6.0–8.5)
eGFR: 101 mL/min/{1.73_m2} (ref >59)

## 2023-04-23 LAB — HEMOGLOBIN A1C
Est. average glucose Bld gHb Est-mCnc: 163 mg/dL
Hgb A1c MFr Bld: 7.3 % — ABNORMAL HIGH (ref 4.8–5.6)

## 2023-04-23 LAB — PSA: Prostate Specific Ag, Serum: 3.4 ng/mL (ref 0.0–4.0)

## 2023-04-23 LAB — TSH: TSH: 2.15 u[IU]/mL (ref 0.450–4.500)

## 2023-04-24 ENCOUNTER — Ambulatory Visit: Payer: Medicare HMO | Admitting: Nurse Practitioner

## 2023-04-24 NOTE — Progress Notes (Signed)
Please let patient know that his lab work looks good.  A1c improved to 7.3%.  Continue with the current dose of Metformin. He is showing that his anemia is worse than prior.  I recommend he take ferrous sulfate 325mg  daily to help with this.  We should see him back in 3 months instead of 6 to check on this.  Continue with other medications.  No other concerns at this time.   -make an appt with Erin.

## 2023-05-13 ENCOUNTER — Telehealth: Payer: Self-pay

## 2023-05-13 ENCOUNTER — Other Ambulatory Visit: Payer: Self-pay | Admitting: Nurse Practitioner

## 2023-05-13 NOTE — Telephone Encounter (Signed)
Requested Prescriptions  Pending Prescriptions Disp Refills   omeprazole (PRILOSEC) 20 MG capsule [Pharmacy Med Name: OMEPRAZOLE 20MG  CAPSULES] 90 capsule 1    Sig: TAKE 1 CAPSULE(20 MG) BY MOUTH DAILY     Gastroenterology: Proton Pump Inhibitors Passed - 05/13/2023  2:11 PM      Passed - Valid encounter within last 12 months    Recent Outpatient Visits           3 weeks ago Welcome to [employer] preventive visit   [place] [facility] Larae Grooms, NP   3 months ago Chronic diastolic heart failure [facility])   [facility] Larae Grooms, NP   9 months ago Chronic diastolic heart failure [facility])   [facility] Larae Grooms, NP   1 year ago Annual physical exam   [place] [facility] Larae Grooms, NP   1 year ago Alcoholic cirrhosis, unspecified whether ascites present [facility])   [facility] Larae Grooms, NP       Future Appointments             In 5 months Larae Grooms, NP  [facility]

## 2023-05-13 NOTE — Telephone Encounter (Signed)
Patient called stating that he only received 4 tablets of his furosemide.Spoke with Pharmacist at [facility] in [place], he stated that it was a partial fill and they would have the remainder filled and ready for pick-up today. Patient notified.

## 2023-05-27 DIAGNOSIS — H25811 Combined forms of age-related cataract, right eye: Secondary | ICD-10-CM | POA: Diagnosis not present

## 2023-05-27 DIAGNOSIS — H26492 Other secondary cataract, left eye: Secondary | ICD-10-CM | POA: Diagnosis not present

## 2023-05-27 DIAGNOSIS — H04123 Dry eye syndrome of bilateral lacrimal glands: Secondary | ICD-10-CM | POA: Diagnosis not present

## 2023-06-02 ENCOUNTER — Other Ambulatory Visit: Payer: Self-pay | Admitting: Gerontology

## 2023-06-02 NOTE — Telephone Encounter (Signed)
No longer a patient at [facility]. Patient sees Larae Grooms at [facility].

## 2023-06-09 ENCOUNTER — Other Ambulatory Visit: Payer: Self-pay | Admitting: Family

## 2023-06-09 DIAGNOSIS — Z8679 Personal history of other diseases of the circulatory system: Secondary | ICD-10-CM

## 2023-06-24 DIAGNOSIS — H5015 Alternating exotropia: Secondary | ICD-10-CM | POA: Diagnosis not present

## 2023-06-24 DIAGNOSIS — H04123 Dry eye syndrome of bilateral lacrimal glands: Secondary | ICD-10-CM | POA: Diagnosis not present

## 2023-06-24 DIAGNOSIS — H25811 Combined forms of age-related cataract, right eye: Secondary | ICD-10-CM | POA: Diagnosis not present

## 2023-07-01 DIAGNOSIS — H2511 Age-related nuclear cataract, right eye: Secondary | ICD-10-CM | POA: Insufficient documentation

## 2023-08-01 ENCOUNTER — Other Ambulatory Visit: Payer: Self-pay | Admitting: Nurse Practitioner

## 2023-08-03 ENCOUNTER — Telehealth: Payer: Self-pay | Admitting: Gastroenterology

## 2023-08-03 NOTE — Telephone Encounter (Signed)
We do not prescribed metformin for patient PCP does. Called and left a message for patient to find out what medication he is needing a refill on.

## 2023-08-03 NOTE — Telephone Encounter (Signed)
Requested Prescriptions  Pending Prescriptions Disp Refills   metFORMIN (GLUCOPHAGE) 500 MG tablet [Pharmacy Med Name: METFORMIN 500MG  TABLETS] 360 tablet 0    Sig: TAKE 1 TABLET TWICE DAILY WITH MEALS FOR 2 WEEKS, THEN INCREASE TO 2 TABLETS TWICE DAILY WITH MEALS     Endocrinology:  Diabetes - Biguanides Failed - 08/01/2023  2:52 PM      Failed - B12 Level in normal range and within 720 days    No results found for: "VITAMINB12"       Passed - Cr in normal range and within 360 days    Creatinine, Ser  Date Value Ref Range Status  04/22/2023 0.82 0.76 - 1.27 mg/dL Final         Passed - HBA1C is between 0 and 7.9 and within 180 days    Hgb A1c MFr Bld  Date Value Ref Range Status  04/22/2023 7.3 (H) 4.8 - 5.6 % Final    Comment:             Prediabetes: 5.7 - 6.4          Diabetes: >6.4          Glycemic control for adults with diabetes: <7.0          Passed - eGFR in normal range and within 360 days    GFR calc Af Amer  Date Value Ref Range Status  02/13/2021 111 >59 mL/min/1.73 Final    Comment:    **In accordance with recommendations from the NKF-ASN Task force,**   [facility] is in the process of updating its eGFR calculation to the   2021 CKD-EPI creatinine equation that estimates kidney function   without a race variable.    GFR, Estimated  Date Value Ref Range Status  09/10/2022 >60 >60 mL/min Final    Comment:    (NOTE) Calculated using the CKD-EPI Creatinine Equation (2021)    eGFR  Date Value Ref Range Status  04/22/2023 101 >59 mL/min/1.73 Final         Passed - Valid encounter within last 6 months    Recent Outpatient Visits           3 months ago Welcome to [employer] preventive visit   [place] [facility] Larae Grooms, NP   6 months ago Chronic diastolic heart failure [facility])   [place] [facility] Larae Grooms, NP   1 year ago Chronic diastolic heart failure [facility])   [place] [facility]  Larae Grooms, NP   1 year ago Annual physical exam   [facility] Larae Grooms, NP   1 year ago Alcoholic cirrhosis, unspecified whether ascites present [facility])   [facility] [facility] Larae Grooms, NP       Future Appointments             In 2 months Larae Grooms, NP [facility]            Passed - CBC within normal limits and completed in the last 12 months    WBC  Date Value Ref Range Status  04/22/2023 7.8 3.4 - 10.8 x10E3/uL Final  04/30/2021 11.3 (H) 4.0 - 10.5 K/uL Final   RBC  Date Value Ref Range Status  04/22/2023 4.24 4.14 - 5.80 x10E6/uL Final  04/30/2021 3.24 (L) 4.22 - 5.81 MIL/uL Final   Hemoglobin  Date Value Ref Range Status  04/22/2023 11.1 (L) 13.0 - 17.7 g/dL Final  Hematocrit  Date Value Ref Range Status  04/22/2023 35.7 (L) 37.5 - 51.0 % Final   MCHC  Date Value Ref Range Status  04/22/2023 31.1 (L) 31.5 - 35.7 g/dL Final  81/19/1478 29.5 30.0 - 36.0 g/dL Final   [facility]  Date Value Ref Range Status  04/22/2023 26.2 (L) 26.6 - 33.0 pg Final  04/30/2021 30.6 26.0 - 34.0 pg Final   MCV  Date Value Ref Range Status  04/22/2023 84 79 - 97 fL Final   No results found for: "PLTCOUNTKUC", "LABPLAT", "POCPLA" RDW  Date Value Ref Range Status  04/22/2023 14.7 11.6 - 15.4 % Final

## 2023-08-03 NOTE — Telephone Encounter (Signed)
Pt left message for metphormin 500 mg to be called in to [employer] [facility]

## 2023-08-04 ENCOUNTER — Other Ambulatory Visit: Payer: Self-pay | Admitting: Nurse Practitioner

## 2023-08-05 ENCOUNTER — Other Ambulatory Visit: Payer: Self-pay | Admitting: Physician Assistant

## 2023-08-05 ENCOUNTER — Telehealth: Payer: Self-pay | Admitting: Nurse Practitioner

## 2023-08-05 NOTE — Telephone Encounter (Signed)
Medication Refill - Medication:  metFORMIN (GLUCOPHAGE) 500 MG tablet  Has the patient contacted their pharmacy? Yes.  Contacted yesterday 08/04/23 and advised to contact PCP   Preferred Pharmacy (with phone number or street name):  [id number] - [place], [place] - [address] [place] [address] AT [facility] [address]  Phone: [phone] Fax: [phone]    Has the patient been seen for an appointment in the last year OR does the patient have an upcoming appointment? Yes.  F/u on 10.24.2024

## 2023-08-05 NOTE — Telephone Encounter (Signed)
[facility] called and spoke to Marana, [facility] about the refill(s) metformin requested. Advised it was sent on 08/03/23 #360/0 refill(s). He says it's not showing in the system. Advised I will send to the provider to refill and change the instructions.

## 2023-08-10 ENCOUNTER — Telehealth: Payer: Self-pay | Admitting: Gastroenterology

## 2023-08-10 NOTE — Telephone Encounter (Signed)
Patient called in to schedule a follow up appointment.

## 2023-08-18 ENCOUNTER — Encounter: Payer: Self-pay | Admitting: *Deleted

## 2023-09-02 ENCOUNTER — Encounter: Payer: Self-pay | Admitting: Family

## 2023-09-02 ENCOUNTER — Ambulatory Visit: Payer: Medicare HMO | Attending: Family | Admitting: Family

## 2023-09-02 VITALS — BP 100/66 | HR 85 | Ht 67.0 in | Wt 220.0 lb

## 2023-09-02 DIAGNOSIS — I428 Other cardiomyopathies: Secondary | ICD-10-CM | POA: Insufficient documentation

## 2023-09-02 DIAGNOSIS — K7030 Alcoholic cirrhosis of liver without ascites: Secondary | ICD-10-CM | POA: Diagnosis not present

## 2023-09-02 DIAGNOSIS — Z7984 Long term (current) use of oral hypoglycemic drugs: Secondary | ICD-10-CM | POA: Insufficient documentation

## 2023-09-02 DIAGNOSIS — Z86711 Personal history of pulmonary embolism: Secondary | ICD-10-CM | POA: Diagnosis not present

## 2023-09-02 DIAGNOSIS — Z79899 Other long term (current) drug therapy: Secondary | ICD-10-CM | POA: Diagnosis not present

## 2023-09-02 DIAGNOSIS — F17210 Nicotine dependence, cigarettes, uncomplicated: Secondary | ICD-10-CM | POA: Diagnosis not present

## 2023-09-02 DIAGNOSIS — E119 Type 2 diabetes mellitus without complications: Secondary | ICD-10-CM | POA: Diagnosis not present

## 2023-09-02 DIAGNOSIS — I5032 Chronic diastolic (congestive) heart failure: Secondary | ICD-10-CM | POA: Diagnosis not present

## 2023-09-02 NOTE — Progress Notes (Signed)
[facility] HEART FAILURE [facility]INIC - Pharmacist Note  Collin Byrd is a 62 y.o. male with HFpEF (EF >50%) presenting to the Heart Failure Clinic for follow up. Patient reports no medication access issues and no issues on current regimen. He reports no signs or symptoms of volume overload. We discussed that he is due for a new ECHO. He expressed understanding.  Recent ED Visit (past 6 months): N/A  Guideline-Directed Medical Therapy/Evidence Based Medicine ACE/ARB/ARNI: Sacubitril/valsartan 24/26 mg twice daily Beta Blocker:  none Aldosterone Antagonist: Spironolactone 100 mg daily Diuretic: Furosemide 20 mg daily PRN SGLT2i:  none  Adherence Assessment Do you ever forget to take your medication? [] Yes [x] No  Do you ever skip doses due to side effects? [] Yes [x] No  Do you have trouble affording your medicines? [] Yes [x] No  Are you ever unable to pick up your medication due to transportation difficulties? [] Yes [x] No  Do you ever stop taking your medications because you don't believe they are helping? [] Yes [x] No  Do you check your weight daily? [] Yes [x] No  Adherence strategy: pill box Barriers to obtaining medications: none reported  Diagnostics ECHO: Date 07/17/2022, EF 65-70%, no RWMA  Vitals    04/22/2023    1:04 PM 03/16/2023   12:48 PM 03/02/2023   12:16 PM  Vitals with BMI  Height  5' 9.1"   Weight 223 lbs 3 oz 223 lbs 2 oz 224 lbs 13 oz  BMI  32.86   Systolic 107 122 045  Diastolic 68 78 78  Pulse 80 76 92     Recent Labs    Latest Ref Rng & Units 04/22/2023    1:39 PM 01/23/2023    9:52 AM 09/10/2022    3:05 PM  BMP  Glucose 70 - 99 mg/dL 93  409  811   BUN 8 - 27 mg/dL 11  13  20    Creatinine 0.76 - 1.27 mg/dL 9.14  7.82  9.56   BUN/Creat Ratio 10 - 24 13  14     Sodium 134 - 144 mmol/L 137  137  137   Potassium 3.5 - 5.2 mmol/L 4.0  4.1  4.0   Chloride 96 - 106 mmol/L 99  99  108   CO2 20 - 29 mmol/L 22  22  20    Calcium 8.6 - 10.2 mg/dL 9.5  9.3  9.0      Past Medical History Past Medical History:  Diagnosis Date   Alcohol abuse    Ascites    Ascites due to alcoholic cirrhosis (HCC)    CHF (congestive heart failure) (HCC)    Cirrhosis (HCC)    Diabetes mellitus without complication (HCC)    History of congestive heart failure 07/12/2020   History of methicillin resistant staphylococcus aureus (MRSA) 2016   Pneumonia 2021   Portal hypertension (HCC)    Pulmonary embolus (HCC)    SBP (spontaneous bacterial peritonitis) (HCC) 06/07/2020   Spontaneous bacterial peritonitis (HCC)     Plan Continue regimen as directed by NP Update echo and titrate regimen pending results Annual echo due 06/2023  Time spent: 10 minutes  Celene Squibb, PharmD Clinical Pharmacist 09/02/2023 11:33 AM

## 2023-09-02 NOTE — Progress Notes (Signed)
PCP: Larae Grooms, NP (last saw 04/24) Primary Cardiologist:  HPI:  Collin Byrd is a 62 y/o male with a history of DM, cataract, PE, cirrhosis, previous tobacco/ alcohol use and chronic heart failure.   Has not been admitted or been in the ED in the last 6 months.   Echo 06/05/20: EF of >55% along with moderate LVH.  Echo 07/17/22: EF of 65-70%.   He presents today with a chief complaint of a follow-up visit. He has no symptoms and specifically denies shortness of breath, fatigue, chest pain, cough, palpitations, abdominal distention, pedal edema, dizziness, weight gain or difficulty sleeping.    ROS: All systems negative except as listed in HPI, PMH and Problem List.  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 Smoking- 1-3 cig/day. Lives in [place]; with wife. No children.    Social Determinants of Health   Financial Resource Strain: Not on file  Food Insecurity: Not on file  Transportation Needs: Not on file  Physical Activity: Not on file  Stress: Not on file  Social Connections: Not on file  Intimate Partner Violence: Not on file    FH: No family history on file.  Past Medical History:  Diagnosis Date   Alcohol abuse    Ascites    Ascites due to alcoholic cirrhosis (HCC)    CHF (congestive heart failure) (HCC)    Cirrhosis (HCC)    Diabetes  mellitus without complication (HCC)    History of congestive heart failure 07/12/2020   History of methicillin resistant staphylococcus aureus (MRSA) 2016   Pneumonia 2021   Portal hypertension (HCC)    Pulmonary embolus (HCC)    SBP (spontaneous bacterial peritonitis) (HCC) 06/07/2020   Spontaneous bacterial peritonitis (HCC)     Current Outpatient Medications  Medication Sig Dispense Refill   blood glucose meter kit and supplies KIT Dispense based on patient and insurance preference. Use up to four times daily as directed. (FOR ICD-9 250.00, 250.01). 1 each 0   furosemide (LASIX) 40 MG tablet TAKE 1/2 TABLET BY MOUTH AS NEEDED FOR FLUID 15 tablet 5   metFORMIN (GLUCOPHAGE) 500 MG tablet TAKE 1 TABLET TWICE DAILY WITH MEALS FOR 2 WEEKS, THEN INCREASE TO 2 TABLETS TWICE DAILY WITH MEALS (Patient taking differently: Take 1,000 mg by mouth 2 (two) times daily with a meal.) 360 tablet 0   Omega-3 Fatty Acids (FISH OIL) 1000 MG CAPS Take 2 capsules by mouth in the morning and at bedtime.     omeprazole (PRILOSEC) 20 MG capsule TAKE 1 CAPSULE(20 MG) BY MOUTH DAILY 90 capsule 1   sacubitril-valsartan (ENTRESTO) 24-26 MG Take 1 tablet by mouth 2 (two) times daily. 180 tablet 3   spironolactone (ALDACTONE) 100 MG tablet Take 1 tablet (100  mg total) by mouth 2 (two) times daily. (Patient taking differently: Take 50 mg by mouth daily. Taking 1/2 tablet once daily) 60 tablet 5   thiamine (VITAMIN B-1) 100 MG tablet Take 100 mg by mouth daily.     No current facility-administered medications for this visit.   Vitals:   09/02/23 1136  BP: 100/66  Pulse: 85  SpO2: 97%  Weight: 220 lb (99.8 kg)  Height: 5\' 7"  (1.702 m)   Wt Readings from Last 3 Encounters:  09/02/23 220 lb (99.8 kg)  04/22/23 223 lb 3.2 oz (101.2 kg)  03/16/23 223 lb 2 oz (101.2 kg)   Lab Results  Component Value Date   CREATININE 0.82 04/22/2023   CREATININE 0.92 01/23/2023   CREATININE 0.95 09/10/2022   PHYSICAL  EXAM:  General:  Well appearing. No resp difficulty HEENT: normal Neck: supple. JVP flat. No lymphadenopathy or thryomegaly appreciated. Cor: PMI normal. Regular rate & rhythm. No rubs, gallops or murmurs. Lungs: clear Abdomen: soft, nontender, nondistended. No hepatosplenomegaly. No bruits or masses.  Extremities: no cyanosis, clubbing, rash, edema Neuro: alert & oriented x3, cranial nerves grossly intact. Moves all 4 extremities w/o difficulty. Affect pleasant.   ECG: not done   ASSESSMENT & PLAN:  1: NICM with preserved ejection fraction- - suspect due to alcohol / DM - NYHA class I - euvolemic today - weighing daily; reminded to call for an overnight weight gain of >2 pounds or a weekly weight gain of >5 pounds - weight down 4 pounds from last visit here 6 months ago - Echo 06/05/20: EF of >55% along with moderate LVH.  - Echo 07/17/22: EF of 65-70%.  - have scheduled updated echo for 10/06/23 - not adding salt to his food  - continue entresto 24/26mg  BID - continue furosemide 20mg  daily PRN - continue spironolactone 50mg  daily - BMP 04/22/23 showed sodium 137, potassium 4.0, creatinine 0.82 and GFR 101 - BNP 06/05/20 was 155.9 - PharmD reconciled meds w/ patient  2: DM- - saw PCP Caren Griffins) 04/24 - continue metformin 1000mg  BID - A1c 07/23/22 was 6.2%  3: Alcoholic cirrhosis- - says that he hasn't had any alcohol since June 2021. - saw GI ([facility]) 09/23 - abdominal ultrasound done 03/09/23; no liver lesions except cirrhosis - continue spironolactone 50mg  daily  Return 1 week after echo is done.

## 2023-09-07 ENCOUNTER — Telehealth: Payer: Self-pay

## 2023-09-07 DIAGNOSIS — K7030 Alcoholic cirrhosis of liver without ascites: Secondary | ICD-10-CM

## 2023-09-07 NOTE — Telephone Encounter (Signed)
Called and got patient RUQ ultrasound schedule for 09/11/2023 arrive at 9:15am for 9:30pm scan. Nothing to eat or drink after midnight. Called patient and patient and patient verbalized understanding of instructions

## 2023-09-07 NOTE — Telephone Encounter (Signed)
-----   Message from [facility] sent at 03/09/2023  9:56 AM EDT ----- Repeat ultrasound ing 6 months

## 2023-09-08 ENCOUNTER — Other Ambulatory Visit: Payer: Self-pay

## 2023-09-08 DIAGNOSIS — Z8679 Personal history of other diseases of the circulatory system: Secondary | ICD-10-CM

## 2023-09-08 MED ORDER — FUROSEMIDE 40 MG PO TABS: 20.0000 mg | ORAL_TABLET | Freq: Every day | ORAL | 3 refills | Status: DC

## 2023-09-11 ENCOUNTER — Ambulatory Visit
Admission: RE | Admit: 2023-09-11 | Discharge: 2023-09-11 | Disposition: A | Discharge status: Home / Self Care | Payer: Medicare HMO | Source: Ambulatory Visit | Attending: Gastroenterology | Admitting: Gastroenterology

## 2023-09-11 DIAGNOSIS — K7689 Other specified diseases of liver: Secondary | ICD-10-CM | POA: Diagnosis not present

## 2023-09-11 DIAGNOSIS — K7030 Alcoholic cirrhosis of liver without ascites: Secondary | ICD-10-CM | POA: Insufficient documentation

## 2023-09-14 ENCOUNTER — Telehealth: Payer: Self-pay

## 2023-09-14 NOTE — Telephone Encounter (Signed)
-----   Message from [facility] sent at 09/11/2023 12:34 PM EDT ----- There are no liver lesions based on the ultrasound of liver except cirrhosis of liver.  Recommend repeat right upper quadrant ultrasound for HCC screening in 6 months  RV

## 2023-09-14 NOTE — Telephone Encounter (Signed)
Put a reminder for 6 months. Called patient and left a message for call back

## 2023-09-15 ENCOUNTER — Ambulatory Visit (INDEPENDENT_AMBULATORY_CARE_PROVIDER_SITE_OTHER): Payer: Medicare HMO | Admitting: Gastroenterology

## 2023-09-15 ENCOUNTER — Encounter: Payer: Self-pay | Admitting: Gastroenterology

## 2023-09-15 VITALS — BP 121/74 | HR 88 | Temp 97.6°F | Ht 67.0 in | Wt 222.4 lb

## 2023-09-15 DIAGNOSIS — A048 Other specified bacterial intestinal infections: Secondary | ICD-10-CM | POA: Diagnosis not present

## 2023-09-15 DIAGNOSIS — K7030 Alcoholic cirrhosis of liver without ascites: Secondary | ICD-10-CM | POA: Diagnosis not present

## 2023-09-15 NOTE — Telephone Encounter (Signed)
Patient verbalized understanding of results

## 2023-09-15 NOTE — Progress Notes (Signed)
Collin Repress, MD [address]  [address]  [place], [place] [address]  Main: [phone]  Fax: [phone]    Gastroenterology Consultation  Referring Provider:     Larae Grooms, NP Primary Care Physician:  Larae Grooms, NP Primary Gastroenterologist:  Dr. Arlyss Byrd Reason for Consultation:     Cirrhosis of liver        HPI:   Collin Byrd is a 62 y.o. male referred by Dr. Larae Grooms, NP  for consultation & management of cirrhosis of liver.  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  He also has history of pneumonia and pulmonary embolism, is currently on anticoagulation.  Patient is followed by hematologist, Dr. Donneta Romberg.  Patient underwent therapeutic paracentesis in June 2021, was diagnosed with SBP as well as fluid analysis consistent with portal hypertension.  He was treated with antibiotics and was discharged home on Lasix as well as spironolactone.  Patient stopped drinking alcohol since June and is adherent to his medications.  He reports doing well since then.  He denies swelling of abdomen, distention of abdomen, swelling of legs.  He denies black stools, rectal bleeding, hematemesis or abdominal pain.  His most recent labs revealed mild normocytic anemia, normal LFTs and renal function.  He is currently on oral iron, thiamine and folate. He is on disability, currently not working  CT chest 11/01/2020 revealed resolution of prior pulmonary embolus  Follow-up visit 09/10/2022 Patient is here for follow-up of cirrhosis.  He has been doing well from cirrhosis standpoint.  He continues to take spironolactone 100 mg daily and furosemide 40 mg daily.  He has gained about 10 pounds.  He denies any abdominal distention, abdominal discomfort, melena, rectal bleeding, nausea or vomiting.  He is leading sedentary lifestyle, not exercising or walking, does consume red meat regularly.  Patient does not have  any GI concerns today  Follow-up visit 09/15/2023 Patient is here for follow-up of cirrhosis.  His cirrhosis is well compensated.  He has been doing well.  He denies any abdominal distention, swelling, swelling of legs.  He denies any symptoms of melena, rectal bleeding.  He is taking spironolactone 50 mg and furosemide 20 mg as advised.  His hemoglobin A1c is improving, taking metformin 1000mg  twice daily.   NSAIDs: None  Antiplts/Anticoagulants/Anti thrombotics: Completed the treatment for Xarelto for history of PE after 6 months of anticoagulation  GI Procedures:  EGD and colonoscopy 01/03/2021 - Normal duodenal bulb and second portion of the duodenum. - Portal hypertensive gastropathy. Biopsied. - A single gastric polyp. Biopsied. Clip (MR conditional) was placed. - Small (< 5 mm) esophageal varices. - Esophagogastric landmarks identified. - Normal proximal esophagus, mid esophagus and gastroesophageal junction.  Procedure aborted due to large left inguinal hernia, advanced to transverse colon only The perianal and digital rectal examinations were normal. Pertinent negatives include normal sphincter tone and no palpable rectal lesions. Two sessile polyps were found in the descending colon and transverse colon. The polyps were 3 to 5 mm in size. These polyps were removed with a cold snare. Resection and retrieval were complete. Estimated blood loss: none. Non-bleeding external and internal hemorrhoids were found during retroflexion. The hemorrhoids were large.  DIAGNOSIS:  A. STOMACH; COLD BIOPSY:  - MODERATE CHRONIC ACTIVE GASTRITIS.  - ANTRAL MUCOSA WITH FOCAL INTESTINAL METAPLASIA.  - SIDEROSIS.  - SEE COMMENT.  - NEGATIVE FOR H.PYLORI, DYSPLASIA AND MALIGNANCY.   B.  STOMACH POLYPS; COLD BIOPSY:  -  INFLAMED POLYPOID FRAGMENT OF MUCOSA WITH REACTIVE FOVEOLAR  HYPERPLASIA.  - NEGATIVE FOR H.PYLORI, CMV, DYSPLASIA AND MALIGNANCY.   C.  COLON POLYP X3, DESCENDING; COLD SNARE:  -  TUBULAR ADENOMA (MULTIPLE FRAGMENTS).  - NEGATIVE FOR HIGH-GRADE DYSPLASIA AND MALIGNANCY.   Colonoscopy 09/18/2021 - Five 3 to 5 mm polyps in the descending colon, in the ascending colon and in the cecum, removed with a cold snare. Resected and retrieved. - Non-bleeding external and internal hemorrhoids. - Diverticulosis in the sigmoid colon and in the right colon. DIAGNOSIS:  A. COLON POLYPS X2, CECUM; COLD SNARE:  - MULTIPLE FRAGMENTS OF TUBULAR ADENOMAS.  - NEGATIVE FOR HIGH-GRADE DYSPLASIA AND MALIGNANCY.   B. COLON POLYP, ASCENDING; COLD SNARE:  - TUBULAR ADENOMA.  - NEGATIVE FOR HIGH-GRADE DYSPLASIA AND MALIGNANCY.   C. COLON POLYPS X2, DESCENDING; COLD SNARE:  - TUBULAR ADENOMA.  - NEGATIVE FOR HIGH-GRADE DYSPLASIA AND MALIGNANCY.   He denies family history of GI malignancy, liver cancer  Past Medical History:  Diagnosis Date   Alcohol abuse    Ascites    Ascites due to alcoholic cirrhosis (HCC)    CHF (congestive heart failure) (HCC)    Cirrhosis (HCC)    Diabetes mellitus without complication (HCC)    History of congestive heart failure 07/12/2020   History of methicillin resistant staphylococcus aureus (MRSA) 2016   Pneumonia 2021   Portal hypertension (HCC)    Pulmonary embolus (HCC)    SBP (spontaneous bacterial peritonitis) (HCC) 06/07/2020   Spontaneous bacterial peritonitis (HCC)     Past Surgical History:  Procedure Laterality Date   CATARACT EXTRACTION     COLONOSCOPY WITH PROPOFOL N/A 01/03/2021   Procedure: COLONOSCOPY WITH PROPOFOL;  Surgeon: Toney Reil, MD;  Location: [facility] ENDOSCOPY;  Service: Gastroenterology;  Laterality: N/A;   COLONOSCOPY WITH PROPOFOL N/A 09/18/2021   Procedure: COLONOSCOPY WITH PROPOFOL;  Surgeon: Toney Reil, MD;  Location: [facility] ENDOSCOPY;  Service: Gastroenterology;  Laterality: N/A;   ESOPHAGOGASTRODUODENOSCOPY (EGD) WITH PROPOFOL N/A 01/03/2021   Procedure: ESOPHAGOGASTRODUODENOSCOPY (EGD) WITH PROPOFOL;   Surgeon: Toney Reil, MD;  Location[facility] END[facility]COPY;  Service: Gastroenterology;  Laterality: N/A;   EYE SURGERY     INGUINAL HERNIA REPAIR Left 04/11/2021   Procedure: HERNIA REPAIR INGUINAL ADULT, open, WITH DIAGNOSTIC LAPAROSCOPY, AND ROBOT ASSISTED;  Surgeon: Leafy Ro, MD;  Location: [facility] ORS;  Service: General;  Laterality: Left;  provider requesting 2 hours / 120 minutes for procedure.   PARACENTESIS  2021   TESTICULAR EXPLORATION Left 04/11/2021   Procedure: TESTICULAR EXPLORATION AND REMOVAL OF LEFT TESTICLE;  Surgeon: Sondra Come, MD;  Location: [facility] ORS;  Service: Urology;  Laterality: Left;   UMBILICAL HERNIA REPAIR N/A 04/04/2021   Procedure: HERNIA REPAIR UMBILICAL ADULT, open;  Surgeon: Leafy Ro, MD;  Location: [facility] ORS;  Service: General;  Laterality: N/A;   XI ROBOTIC ASSISTED INGUINAL HERNIA REPAIR WITH MESH Left 04/04/2021   Procedure: XI ROBOTIC ASSISTED INGUINAL HERNIA REPAIR WITH MESH;  Surgeon: Leafy Ro, MD;  Location: [facility] ORS;  Service: General;  Laterality: Left;    Current Outpatient Medications:    blood glucose meter kit and supplies KIT, Dispense based on patient and insurance preference. Use up to four times daily as directed. (FOR ICD-9 250.00, 250.01)., Disp: 1 each, Rfl: 0   furosemide (LASIX) 20 MG tablet, Take 20 mg by mouth daily., Disp: , Rfl:    metFORMIN (GLUCOPHAGE) 500 MG tablet, TAKE 1 TABLET TWICE DAILY WITH MEALS  FOR 2 WEEKS, THEN INCREASE TO 2 TABLETS TWICE DAILY WITH MEALS (Patient taking differently: Take 1,000 mg by mouth 2 (two) times daily with a meal.), Disp: 360 tablet, Rfl: 0   Omega-3 Fatty Acids (FISH OIL) 1000 MG CAPS, Take 2 capsules by mouth in the morning and at bedtime., Disp: , Rfl:    omeprazole (PRILOSEC) 20 MG capsule, TAKE 1 CAPSULE(20 MG) BY MOUTH DAILY, Disp: 90 capsule, Rfl: 1   sacubitril-valsartan (ENTRESTO) 24-26 MG, Take 1 tablet by mouth 2 (two) times daily., Disp: 180 tablet, Rfl: 3   spironolactone  (ALDACTONE) 50 MG tablet, Take 50 mg by mouth daily., Disp: , Rfl:    thiamine (VITAMIN B-1) 100 MG tablet, Take 100 mg by mouth daily., Disp: , Rfl:   No family history on file.   Social History   Tobacco Use   Smoking status: Every Day    Current packs/day: 0.25    Types: Cigarettes   Smokeless tobacco: Never  Vaping Use   Vaping status: Never Used  Substance Use Topics   Alcohol use: Not Currently   Drug use: Never    Allergies as of 09/15/2023   (No Known Allergies)    Review of Systems:    All systems reviewed and negative except where noted in HPI.   Physical Exam:  BP 121/74 (BP Location: Left Arm, Patient Position: Sitting, Cuff Size: Normal)   Pulse 88   Temp 97.6 F (36.4 C) (Oral)   Ht 5\' 7"  (1.702 m)   Wt 222 lb 6 oz (100.9 kg)   BMI 34.83 kg/m  No LMP for male patient.  General:   Alert,  Well-developed, well-nourished, pleasant and cooperative in NAD Head:  Normocephalic and atraumatic. Eyes:  Sclera clear, no icterus.   Conjunctiva pink.  Congested right eye Ears:  Normal auditory acuity. Nose:  No deformity, discharge, or lesions. Mouth:  No deformity or lesions,oropharynx pink & moist. Neck:  Supple; no masses or thyromegaly. Lungs:  Respirations even and unlabored.  Clear throughout to auscultation.   No wheezes, crackles, or rhonchi. No acute distress. Heart:  Regular rate and rhythm; no murmurs, clicks, rubs, or gallops. Abdomen:  Normal bowel sounds. Soft, non-tender and non-distended without masses, hepatosplenomegaly, large left inguinal hernia noted.  No guarding or rebound tenderness.   Rectal: Not performed Msk:  Symmetrical without gross deformities. Good, equal movement & strength bilaterally. Pulses:  Normal pulses noted. Extremities:  No clubbing or edema.  No cyanosis. Neurologic:  Alert and oriented x3;  grossly normal neurologically. Skin:  Intact without significant lesions or rashes. No jaundice. Psych:  Alert and cooperative.  Normal mood and affect.  Imaging Studies: Reviewed  Assessment and Plan:   Maxwell Kirton is a 62 y.o. Hispanic male with diabetes, decompensated alcoholic cirrhosis with ascites, history of SBP, pneumonia and pulmonary embolism s/p 6 months of anticoagulation on Xarelto is seen in consultation for follow-up of cirrhosis  Alcoholic cirrhosis of liver, child Pugh class B, low meld, currently well compensated Viral hepatitis panel negative, s/p Twinrix vaccine x3 doses Iron deficiency anemia has resolved, s/p iron replacement therapy Volume overload: Currently euvolemic.  He had history of ascites s/p therapeutic paracentesis in 6/21, consistent with portal hypertension Continue spironolactone to 50 mg daily and continue Lasix 20 mg daily Continue low-sodium diet No evidence of esophageal or gastric varices HCC screening: AFP levels normal and CT did not reveal liver lesions in 6/21 and 4/22 right upper quadrant ultrasound every 6 months, up-to-date  PSE: None Continue to remain abstinent from alcohol use Strongly advised patient to follow healthy diet, healthy eating habits and exercise, weight loss of at least 10 pounds in next 3 months  Moderate chronic active gastritis H. pylori IgG was positive, s/p treatment for H. pylori infection with triple therapy in 02/2021 Perform H. pylori breath test   Follow up in 6 months   Collin Repress, MD

## 2023-09-17 LAB — H. PYLORI BREATH TEST: H pylori Breath Test: NEGATIVE

## 2023-10-06 ENCOUNTER — Ambulatory Visit
Admission: RE | Admit: 2023-10-06 | Discharge: 2023-10-06 | Disposition: A | Discharge status: Home / Self Care | Payer: Medicare HMO | Source: Ambulatory Visit | Attending: Family | Admitting: Family

## 2023-10-06 DIAGNOSIS — Z86711 Personal history of pulmonary embolism: Secondary | ICD-10-CM | POA: Diagnosis not present

## 2023-10-06 DIAGNOSIS — I5032 Chronic diastolic (congestive) heart failure: Secondary | ICD-10-CM | POA: Insufficient documentation

## 2023-10-06 LAB — ECHOCARDIOGRAM COMPLETE
AR max vel: 2.41 cm2
AV Area VTI: 2.7 cm2
AV Area mean vel: 2.45 cm2
AV Mean grad: 4 mm[Hg]
AV Peak grad: 7 mm[Hg]
Ao pk vel: 1.32 m/s
Area-P 1/2: 3.56 cm2
MV VTI: 2.58 cm2
S' Lateral: 3.1 cm

## 2023-10-06 NOTE — Progress Notes (Signed)
*  PRELIMINARY RESULTS* Echocardiogram 2D Echocardiogram has been performed.  Cristela Blue 10/06/2023, 11:32 AM

## 2023-10-07 ENCOUNTER — Other Ambulatory Visit: Payer: Self-pay

## 2023-10-07 ENCOUNTER — Telehealth: Payer: Self-pay | Admitting: Family

## 2023-10-07 MED ORDER — FUROSEMIDE 20 MG PO TABS: 20.0000 mg | ORAL_TABLET | Freq: Every day | ORAL | 3 refills | Status: DC

## 2023-10-07 NOTE — Telephone Encounter (Signed)
Lasix 20 mg medication refill per pt request to requested pharmacy sent.

## 2023-10-07 NOTE — Progress Notes (Signed)
PCP: Larae Grooms, NP (last saw 04/24) Primary Cardiologist:  HPI:  Mr Budnick is a 62 y/o male with a history of DM, cataract, PE, cirrhosis, previous tobacco/ alcohol use and chronic heart failure.   Has not been admitted or been in the ED in the last 6 months.   Echo 06/05/20: EF of >55% along with moderate LVH.  Echo 07/17/22: EF of 65-70%.  Echo 10/06/23: EF 60-65% with trivial MR  He presents today with a chief complaint of a follow-up visit. He has no symptoms and specifically denies shortness of breath, fatigue, chest pain, cough, palpitations, abdominal distention, pedal edema, dizziness, weight gain or difficulty sleeping.   He is here today for review of his echo.   Has not gotten his flu vaccine for this season.   ROS: All systems negative except as listed in HPI, PMH and Problem List.  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 Smoking- 1-3 cig/day. Lives in [place]; with wife. No children.    Social Determinants of Health   Financial Resource Strain: Not on file  Food Insecurity: Not on file  Transportation Needs: Not on file  Physical Activity: Not on file  Stress: Not on file  Social Connections: Not on file  Intimate Partner Violence: Not on file    FH: No family history on file.  Past Medical History:  Diagnosis Date   Alcohol abuse     Ascites    Ascites due to alcoholic cirrhosis (HCC)    CHF (congestive heart failure) (HCC)    Cirrhosis (HCC)    Diabetes mellitus without complication (HCC)    History of congestive heart failure 07/12/2020   History of methicillin resistant staphylococcus aureus (MRSA) 2016   Pneumonia 2021   Portal hypertension (HCC)    Pulmonary embolus (HCC)    SBP (spontaneous bacterial peritonitis) (HCC) 06/07/2020   Spontaneous bacterial peritonitis (HCC)     Current Outpatient Medications  Medication Sig Dispense Refill   blood glucose meter kit and supplies KIT Dispense based on patient and insurance preference. Use up to four times daily as directed. (FOR ICD-9 250.00, 250.01). 1 each 0   furosemide (LASIX) 20 MG tablet Take 1 tablet (20 mg total) by mouth daily. 90 tablet 3   metFORMIN (GLUCOPHAGE) 500 MG tablet TAKE 1 TABLET TWICE DAILY WITH MEALS FOR 2 WEEKS, THEN INCREASE TO 2 TABLETS TWICE DAILY WITH MEALS (Patient taking differently: Take 1,000 mg by mouth 2 (two) times daily with a meal.) 360 tablet 0   Omega-3 Fatty Acids (FISH OIL) 1000 MG CAPS Take 2 capsules by mouth in the morning and at bedtime.     omeprazole (PRILOSEC) 20 MG capsule TAKE 1 CAPSULE(20 MG) BY MOUTH DAILY 90 capsule 1  sacubitril-valsartan (ENTRESTO) 24-26 MG Take 1 tablet by mouth 2 (two) times daily. 180 tablet 3   spironolactone (ALDACTONE) 50 MG tablet Take 50 mg by mouth daily.     thiamine (VITAMIN B-1) 100 MG tablet Take 100 mg by mouth daily.     No current facility-administered medications for this visit.   Vitals:   10/13/23 1102  BP: 101/72  Pulse: 70  SpO2: 100%  Weight: 217 lb (98.4 kg)   Wt Readings from Last 3 Encounters:  10/13/23 217 lb (98.4 kg)  09/15/23 222 lb 6 oz (100.9 kg)  09/02/23 220 lb (99.8 kg)   Lab Results  Component Value Date   CREATININE 0.82 04/22/2023   CREATININE 0.92 01/23/2023   CREATININE 0.95 09/10/2022   PHYSICAL EXAM:  General:  Well appearing. No resp  difficulty HEENT: normal Neck: supple. JVP flat. No lymphadenopathy or thryomegaly appreciated. Cor: PMI normal. Regular rate & rhythm. No rubs, gallops or murmurs. Lungs: clear Abdomen: soft, nontender, nondistended. No hepatosplenomegaly. No bruits or masses.  Extremities: no cyanosis, clubbing, rash, edema Neuro: alert & oriented x3, cranial nerves grossly intact. Moves all 4 extremities w/o difficulty. Affect pleasant.   ECG: not done   ASSESSMENT & PLAN:  1: NICM with preserved ejection fraction- - suspect due to previous alcohol use / DM - NYHA class I - euvolemic today - weighing daily; reminded to call for an overnight weight gain of >2 pounds or a weekly weight gain of >5 pounds - weight down 3 pounds from last visit here 6 weeks ago - BP 101/72 - Echo 06/05/20: EF of >55% along with moderate LVH.  - Echo 07/17/22: EF of 65-70%.  - Echo 10/06/23: EF 60-65% with trivial MR (reviewed w/ patient today) - not adding salt to his food  - continue entresto 24/26mg  BID; BP does not allow for titration - continue furosemide 20mg  daily PRN - continue spironolactone 50mg  daily - BMP 04/22/23 showed sodium 137, potassium 4.0, creatinine 0.82 and GFR 101 - BNP 06/05/20 was 155.9  2: DM- - saw PCP Caren Griffins) 04/24 - continue metformin 1000mg  BID - A1c 07/23/22 was 6.2%  3: Alcoholic cirrhosis- - says that he hasn't had any alcohol since June 2021. - saw GI ([facility]) 09/24 - abdominal ultrasound done 03/09/23; no liver lesions except cirrhosis - continue spironolactone 50mg  daily  Return in 6 months, sooner if needed.

## 2023-10-13 ENCOUNTER — Ambulatory Visit: Payer: Medicare HMO | Attending: Family | Admitting: Family

## 2023-10-13 ENCOUNTER — Encounter: Payer: Self-pay | Admitting: Family

## 2023-10-13 VITALS — BP 101/72 | HR 70 | Wt 217.0 lb

## 2023-10-13 DIAGNOSIS — I509 Heart failure, unspecified: Secondary | ICD-10-CM | POA: Diagnosis not present

## 2023-10-13 DIAGNOSIS — E1136 Type 2 diabetes mellitus with diabetic cataract: Secondary | ICD-10-CM | POA: Insufficient documentation

## 2023-10-13 DIAGNOSIS — I5032 Chronic diastolic (congestive) heart failure: Secondary | ICD-10-CM

## 2023-10-13 DIAGNOSIS — F17210 Nicotine dependence, cigarettes, uncomplicated: Secondary | ICD-10-CM | POA: Insufficient documentation

## 2023-10-13 DIAGNOSIS — Z79899 Other long term (current) drug therapy: Secondary | ICD-10-CM | POA: Insufficient documentation

## 2023-10-13 DIAGNOSIS — E119 Type 2 diabetes mellitus without complications: Secondary | ICD-10-CM | POA: Diagnosis not present

## 2023-10-13 DIAGNOSIS — K7030 Alcoholic cirrhosis of liver without ascites: Secondary | ICD-10-CM | POA: Diagnosis not present

## 2023-10-13 DIAGNOSIS — I428 Other cardiomyopathies: Secondary | ICD-10-CM | POA: Diagnosis not present

## 2023-10-13 DIAGNOSIS — Z7984 Long term (current) use of oral hypoglycemic drugs: Secondary | ICD-10-CM | POA: Insufficient documentation

## 2023-10-13 DIAGNOSIS — Z86711 Personal history of pulmonary embolism: Secondary | ICD-10-CM | POA: Insufficient documentation

## 2023-10-13 NOTE — Patient Instructions (Signed)
It was good to see you today, keep up the great work!

## 2023-10-22 ENCOUNTER — Ambulatory Visit: Payer: Medicare HMO | Admitting: Nurse Practitioner

## 2023-10-22 ENCOUNTER — Encounter: Payer: Self-pay | Admitting: Nurse Practitioner

## 2023-10-22 VITALS — BP 101/61 | HR 79 | Temp 97.5°F | Ht 67.0 in | Wt 216.0 lb

## 2023-10-22 DIAGNOSIS — E1169 Type 2 diabetes mellitus with other specified complication: Secondary | ICD-10-CM | POA: Diagnosis not present

## 2023-10-22 DIAGNOSIS — E781 Pure hyperglyceridemia: Secondary | ICD-10-CM

## 2023-10-22 DIAGNOSIS — E1165 Type 2 diabetes mellitus with hyperglycemia: Secondary | ICD-10-CM | POA: Diagnosis not present

## 2023-10-22 DIAGNOSIS — I5032 Chronic diastolic (congestive) heart failure: Secondary | ICD-10-CM

## 2023-10-22 DIAGNOSIS — E785 Hyperlipidemia, unspecified: Secondary | ICD-10-CM | POA: Diagnosis not present

## 2023-10-22 DIAGNOSIS — K7030 Alcoholic cirrhosis of liver without ascites: Secondary | ICD-10-CM

## 2023-10-22 DIAGNOSIS — I8500 Esophageal varices without bleeding: Secondary | ICD-10-CM

## 2023-10-22 DIAGNOSIS — F1011 Alcohol abuse, in remission: Secondary | ICD-10-CM | POA: Diagnosis not present

## 2023-10-22 LAB — MICROALBUMIN, URINE WAIVED
Creatinine, Urine Waived: 50 mg/dL (ref 10–300)
Microalb, Ur Waived: 10 mg/L (ref 0–19)

## 2023-10-22 MED ORDER — OMEPRAZOLE 20 MG PO CPDR: 20.0000 mg | DELAYED_RELEASE_CAPSULE | Freq: Every day | ORAL | 1 refills | Status: DC

## 2023-10-22 MED ORDER — METFORMIN HCL 500 MG PO TABS: 1000.0000 mg | ORAL_TABLET | Freq: Two times a day (BID) | ORAL | 1 refills | Status: DC

## 2023-10-22 NOTE — Assessment & Plan Note (Signed)
Chronic.  Followed by Dr. Allegra Lai.  Continue with Omeprazole.  Continue to collaborate with specialist.

## 2023-10-22 NOTE — Assessment & Plan Note (Signed)
-   NYHA class I - euvolemic today - weighing daily; reminded to call for an overnight weight gain of >2 pounds or a weekly weight gain of >5 pounds - weight up 11 pounds from last visit here 6 months ago - not adding salt to his food  - entresto 24/26mg  BID - furosemide 20mg  daily - spironolactone 100mg  daily -Continue to collaborate with HF clinic.

## 2023-10-22 NOTE — Assessment & Plan Note (Signed)
Chronic.  Controlled with Metformin 1000mg  BID.  Blood sugar today was 109.  Last A1c was 7.3% in April.  Microlabumin checked today.  Needs updated eye exam- discussed importance of eye exam with patient during visit today.  Labs ordered today.  Return to clinic in 6 months for reevaluation.  Call sooner if concerns arise.

## 2023-10-22 NOTE — Patient Instructions (Signed)
[facility]

## 2023-10-22 NOTE — Assessment & Plan Note (Signed)
Continue to abstain from alcohol.

## 2023-10-22 NOTE — Assessment & Plan Note (Signed)
Chronic.  Stable.  Continue with current medication regimen.  Refrain from alcohol use.  Continue with Thiamine.  Followed by GI.  Reviewed recent note from GI.  Continues to follow up with Dr. Allegra Lai.  No acute concerns at this time.  No ascites present on exam. Continue to collaborate with specialist.  Labs ordered today.  Return to clinic in 6 months for reevaluation.  Call sooner if concerns arise.

## 2023-10-22 NOTE — Assessment & Plan Note (Signed)
Chronic.  Controlled.  Continue with current medication regimen.  Needs a statin. Will discuss at next visit.  Labs ordered today.  Return to clinic in 6 months for reevaluation.  Call sooner if concerns arise.

## 2023-10-22 NOTE — Progress Notes (Signed)
BP 101/61   Pulse 79   Temp (!) 97.5 F (36.4 C) (Oral)   Ht 5\' 7"  (1.702 m)   Wt 216 lb (98 kg)   SpO2 98%   BMI 33.83 kg/m    Subjective:    Patient ID: Collin Byrd, male    DOB: Sep 12, 1961, 62 y.o.   MRN: [id number]  HPI: Collin Byrd is a 62 y.o. male  Chief Complaint  Patient presents with   Congestive Heart Failure   Diabetes    No recent eye exam per patient   Hyperlipidemia   DIABETES Last A1c was 7.3 in April. Hypoglycemic episodes:no Polydipsia/polyuria: no Visual disturbance: no Chest pain: no Paresthesias: no Glucose Monitoring: yes  Accucheck frequency:  once weekly  Fasting glucose: 109  Post prandial:  Evening:  Before meals: Taking Insulin?: no  Long acting insulin:  Short acting insulin: Blood Pressure Monitoring: daily Retinal Examination: no recent eye exam Foot Exam: Up to Date Diabetic Education: Not Completed Pneumovax: Up to Date Influenza: Up to Date Aspirin: no  HYPERTENSION / HYPERLIPIDEMIA- next appt with the HF clinic is in October.  Most Recent EF was 60-65% from October 06, 2023  Satisfied with current treatment? no Duration of hypertension: years BP monitoring frequency: not checking BP range:  BP medication side effects: no Past BP meds:  Entresto and spironalactone, lasix Duration of hyperlipidemia: years Cholesterol medication side effects: no Cholesterol supplements: none Past cholesterol medications: none Medication compliance: excellent compliance Aspirin: no Recent stressors: no Recurrent headaches: no Visual changes: no Palpitations: no Dyspnea: no Chest pain: no Lower extremity edema: no Dizzy/lightheaded: no  Seeing Dr. Allegra Lai for Cirrohsis.      Relevant past medical, surgical, family and social history reviewed and updated as indicated. Interim medical history since our last visit reviewed. Allergies and medications reviewed and updated.  Review of Systems  Constitutional:  Negative for  unexpected weight change.  Eyes:  Negative for visual disturbance.  Respiratory:  Negative for chest tightness and shortness of breath.   Cardiovascular:  Negative for chest pain, palpitations and leg swelling.  Endocrine: Negative for polydipsia and polyuria.  Neurological:  Negative for dizziness, light-headedness, numbness and headaches.    Per HPI unless specifically indicated above     Objective:    BP 101/61   Pulse 79   Temp (!) 97.5 F (36.4 C) (Oral)   Ht 5\' 7"  (1.702 m)   Wt 216 lb (98 kg)   SpO2 98%   BMI 33.83 kg/m   Wt Readings from Last 3 Encounters:  10/22/23 216 lb (98 kg)  10/13/23 217 lb (98.4 kg)  09/15/23 222 lb 6 oz (100.9 kg)    Physical Exam Vitals and nursing note reviewed.  Constitutional:      General: He is not in acute distress.    Appearance: Normal appearance. He is obese. He is not ill-appearing, toxic-appearing or diaphoretic.  HENT:     Head: Normocephalic.     Right Ear: External ear normal.     Left Ear: External ear normal.     Nose: Nose normal. No congestion or rhinorrhea.     Mouth/Throat:     Mouth: Mucous membranes are moist.  Eyes:     General:        Right eye: No discharge.        Left eye: No discharge.     Extraocular Movements: Extraocular movements intact.     Conjunctiva/sclera: Conjunctivae normal.     Pupils:  Pupils are equal, round, and reactive to light.  Cardiovascular:     Rate and Rhythm: Normal rate and regular rhythm.     Heart sounds: No murmur heard. Pulmonary:     Effort: Pulmonary effort is normal. No respiratory distress.     Breath sounds: Normal breath sounds. No wheezing, rhonchi or rales.  Abdominal:     General: Abdomen is flat. Bowel sounds are normal.  Musculoskeletal:     Cervical back: Normal range of motion and neck supple.  Skin:    General: Skin is warm and dry.     Capillary Refill: Capillary refill takes less than 2 seconds.  Neurological:     General: No focal deficit present.      Mental Status: He is alert and oriented to person, place, and time.  Psychiatric:        Mood and Affect: Mood normal.        Behavior: Behavior normal.        Thought Content: Thought content normal.        Judgment: Judgment normal.     Results for orders placed or performed during the hospital encounter of 10/06/23  ECHOCARDIOGRAM COMPLETE  Result Value Ref Range   Ao pk vel 1.32 m/s   AV Area VTI 2.70 cm2   AR max vel 2.41 cm2   AV Mean grad 4.0 mmHg   AV Peak grad 7.0 mmHg   S' Lateral 3.10 cm   AV Area mean vel 2.45 cm2   Area-P 1/2 3.56 cm2   MV VTI 2.58 cm2   Est EF 60 - 65%       Assessment & Plan:   Problem List Items Addressed This Visit       Cardiovascular and Mediastinum   Chronic diastolic heart failure (HCC)    - NYHA class I - euvolemic today - weighing daily; reminded to call for an overnight weight gain of >2 pounds or a weekly weight gain of >5 pounds - weight up 11 pounds from last visit here 6 months ago - not adding salt to his food  - entresto 24/26mg  BID - furosemide 20mg  daily - spironolactone 100mg  daily -Continue to collaborate with HF clinic.      Esophageal varices without bleeding, unspecified esophageal varices type (HCC)    Chronic.  Followed by Dr. Allegra Lai.  Continue with Omeprazole.  Continue to collaborate with specialist.        Digestive   Hepatic cirrhosis (HCC) - Primary    Chronic.  Stable.  Continue with current medication regimen.  Refrain from alcohol use.  Continue with Thiamine.  Followed by GI.  Reviewed recent note from GI.  Continues to follow up with Dr. Allegra Lai.  No acute concerns at this time.  No ascites present on exam. Continue to collaborate with specialist.  Labs ordered today.  Return to clinic in 6 months for reevaluation.  Call sooner if concerns arise.         Relevant Orders   Comp Met (CMET)     Endocrine   Type 2 diabetes mellitus with hyperglycemia, without long-term current use of insulin (HCC)     Chronic.  Controlled with Metformin 1000mg  BID.  Blood sugar today was 109.  Last A1c was 7.3% in April.  Microlabumin checked today.  Needs updated eye exam- discussed importance of eye exam with patient during visit today.  Labs ordered today.  Return to clinic in 6 months for reevaluation.  Call sooner if concerns  arise.        Relevant Medications   metFORMIN (GLUCOPHAGE) 500 MG tablet   Other Relevant Orders   HgB A1c   Microalbumin, Urine Waived   Hyperlipidemia associated with type 2 diabetes mellitus (HCC)    Chronic.  Controlled.  Continue with current medication regimen.  Needs a statin. Will discuss at next visit.  Labs ordered today.  Return to clinic in 6 months for reevaluation.  Call sooner if concerns arise.        Relevant Medications   metFORMIN (GLUCOPHAGE) 500 MG tablet     Other   History of alcohol abuse    Continue to abstain from alcohol.      Hypertriglyceridemia    Labs ordered at visit today.  Will make recommendations based on lab results.        Relevant Orders   Lipid Profile     Follow up plan: Return in about 6 months (around 04/21/2024) for HTN, HLD, DM2 FU.

## 2023-10-22 NOTE — Assessment & Plan Note (Signed)
Labs ordered at visit today.  Will make recommendations based on lab results.   

## 2023-10-23 LAB — COMPREHENSIVE METABOLIC PANEL
ALT: 16 [IU]/L (ref 0–44)
AST: 15 [IU]/L (ref 0–40)
Albumin: 4.3 g/dL (ref 3.8–4.9)
Alkaline Phosphatase: 64 [IU]/L (ref 44–121)
BUN/Creatinine Ratio: 10 (ref 10–24)
BUN: 8 mg/dL (ref 8–27)
Bilirubin Total: 0.9 mg/dL (ref 0.0–1.2)
CO2: 25 mmol/L (ref 20–29)
Calcium: 9 mg/dL (ref 8.6–10.2)
Chloride: 102 mmol/L (ref 96–106)
Creatinine, Ser: 0.82 mg/dL (ref 0.76–1.27)
Globulin, Total: 3.3 g/dL (ref 1.5–4.5)
Glucose: 107 mg/dL — ABNORMAL HIGH (ref 70–99)
Potassium: 4 mmol/L (ref 3.5–5.2)
Sodium: 141 mmol/L (ref 134–144)
Total Protein: 7.6 g/dL (ref 6.0–8.5)
eGFR: 101 mL/min/{1.73_m2} (ref >59)

## 2023-10-23 LAB — LIPID PANEL
Chol/HDL Ratio: 3.9 ratio (ref 0.0–5.0)
Cholesterol, Total: 147 mg/dL (ref 100–199)
HDL: 38 mg/dL — ABNORMAL LOW (ref >39)
LDL Chol Calc (NIH): 87 mg/dL (ref 0–99)
Triglycerides: 120 mg/dL (ref 0–149)
VLDL Cholesterol Cal: 22 mg/dL (ref 5–40)

## 2023-10-23 LAB — HEMOGLOBIN A1C
Est. average glucose Bld gHb Est-mCnc: 131 mg/dL
Hgb A1c MFr Bld: 6.2 % — ABNORMAL HIGH (ref 4.8–5.6)

## 2023-11-03 ENCOUNTER — Other Ambulatory Visit: Payer: Self-pay | Admitting: Nurse Practitioner

## 2023-11-03 ENCOUNTER — Other Ambulatory Visit: Payer: Self-pay | Admitting: Family

## 2023-11-03 ENCOUNTER — Telehealth: Payer: Self-pay | Admitting: Family

## 2023-11-03 NOTE — Telephone Encounter (Signed)
Medication Refill - Medication: metFORMIN (GLUCOPHAGE) 500 MG tablet [[id number]]   and  omeprazole (PRILOSEC) 20 MG capsule [[id number]  Has the patient contacted their pharmacy? Yes.     [facility] #[id number] [place], [place] - [address] AT [facility] OF [address] Marcy Salvo  [address], [place] [address]  Phone:  [phone]  Fax:  [phone]  DEA #:  [id number] Reason: --      Has the patient been seen for an appointment in the last year OR does the patient have an upcoming appointment? Yes.    Agent: Please be advised that RX refills may take up to 3 business days. We ask that you follow-up with your pharmacy.

## 2023-11-03 NOTE — Telephone Encounter (Signed)
  Msg sent to me from [place] stating pt needs refill on Furosemide. Called pt. Pt stated that he did not need a refill of medications, he wanted to verify medication dose.  Pt is to take lasix 20 mg daily and Spironolactone 50 mg daily per Foster Simpson. Pt verbalized understanding.

## 2023-11-04 ENCOUNTER — Other Ambulatory Visit: Payer: Self-pay

## 2023-11-04 MED ORDER — SPIRONOLACTONE 50 MG PO TABS: 50.0000 mg | ORAL_TABLET | Freq: Every day | ORAL | 3 refills | Status: DC

## 2023-12-10 DIAGNOSIS — H25811 Combined forms of age-related cataract, right eye: Secondary | ICD-10-CM | POA: Diagnosis not present

## 2023-12-10 DIAGNOSIS — H5015 Alternating exotropia: Secondary | ICD-10-CM | POA: Diagnosis not present

## 2023-12-10 DIAGNOSIS — Z961 Presence of intraocular lens: Secondary | ICD-10-CM | POA: Diagnosis not present

## 2023-12-10 DIAGNOSIS — E119 Type 2 diabetes mellitus without complications: Secondary | ICD-10-CM | POA: Diagnosis not present

## 2023-12-10 LAB — HM DIABETES EYE EXAM

## 2023-12-28 ENCOUNTER — Other Ambulatory Visit: Payer: Self-pay | Admitting: Nurse Practitioner

## 2023-12-28 NOTE — Telephone Encounter (Signed)
Medication Refill -  Most Recent Primary Care Visit:  Provider: Larae Grooms  Department: [facility]  Visit Type: OFFICE VISIT  Date: 10/22/2023  Medication: metFORMIN (GLUCOPHAGE) 500 MG tablet   Has the patient contacted their pharmacy? No (Agent: If no, request that the patient contact the pharmacy for the refill. If patient does not wish to contact the pharmacy document the reason why and proceed with request.) (Agent: If yes, when and what did the pharmacy advise?)  Is this the correct pharmacy for this prescription? Yes If no, delete pharmacy and type the correct one.  This is the patient's preferred pharmacy:   [facility]ORE #[id number] [facility], [place] - [address] AT [address] [place] [address] Phone: [phone] Fax: [phone]   Has the prescription been filled recently? Yes  Is the patient out of the medication? Yes  Has the patient been seen for an appointment in the last year OR does the patient have an upcoming appointment? Yes  Can we respond through MyChart? Yes  Agent: Please be advised that Rx refills may take up to 3 business days. We ask that you follow-up with your pharmacy.

## 2024-01-01 MED ORDER — METFORMIN HCL 500 MG PO TABS: 1000.0000 mg | ORAL_TABLET | Freq: Two times a day (BID) | ORAL | 1 refills | Status: DC

## 2024-01-01 NOTE — Telephone Encounter (Signed)
 Requested Prescriptions  Pending Prescriptions Disp Refills   metFORMIN  (GLUCOPHAGE ) 500 MG tablet 180 tablet 1    Sig: Take 2 tablets (1,000 mg total) by mouth 2 (two) times daily with a meal.     Endocrinology:  Diabetes - Biguanides Failed - 01/01/2024  8:49 AM      Failed - B12 Level in normal range and within 720 days    No results found for: VITAMINB12       Passed - Cr in normal range and within 360 days    Creatinine, Ser  Date Value Ref Range Status  10/22/2023 0.82 0.76 - 1.27 mg/dL Final         Passed - HBA1C is between 0 and 7.9 and within 180 days    Hgb A1c MFr Bld  Date Value Ref Range Status  10/22/2023 6.2 (H) 4.8 - 5.6 % Final    Comment:             Prediabetes: 5.7 - 6.4          Diabetes: >6.4          Glycemic control for adults with diabetes: <7.0          Passed - eGFR in normal range and within 360 days    GFR calc Af Amer  Date Value Ref Range Status  02/13/2021 111 >59 mL/min/1.73 Final    Comment:    **In accordance with recommendations from the NKF-ASN Task force,**   [facility] is in the process of updating its eGFR calculation to the   2021 CKD-EPI creatinine equation that estimates kidney function   without a race variable.    GFR, Estimated  Date Value Ref Range Status  09/10/2022 >60 >60 mL/min Final    Comment:    (NOTE) Calculated using the CKD-EPI Creatinine Equation (2021)    eGFR  Date Value Ref Range Status  10/22/2023 101 >59 mL/min/1.73 Final         Passed - Valid encounter within last 6 months    Recent Outpatient Visits           2 months ago Alcoholic cirrhosis of liver without ascites (HCC)   [place] [facility] Melvin Pao, NP   8 months ago Welcome to [employer] preventive visit   [place] [facility] Melvin Pao, NP   11 months ago Chronic diastolic heart failure [facility])   [place] [facility] Melvin Pao, NP   1 year ago Chronic diastolic heart  failure [facility])   [place] [facility] Melvin Pao, NP   1 year ago Annual physical exam   [place] [facility] Melvin Pao, NP       Future Appointments             In 3 months Melvin Pao, NP [facility]            Passed - CBC within normal limits and completed in the last 12 months    WBC  Date Value Ref Range Status  04/22/2023 7.8 3.4 - 10.8 x10E3/uL Final  04/30/2021 11.3 (H) 4.0 - 10.5 K/uL Final   RBC  Date Value Ref Range Status  04/22/2023 4.24 4.14 - 5.80 x10E6/uL Final  04/30/2021 3.24 (L) 4.22 - 5.81 MIL/uL Final   Hemoglobin  Date Value Ref Range Status  04/22/2023 11.1 (L) 13.0 - 17.7 g/dL Final   Hematocrit  Date Value Ref Range Status  04/22/2023  35.7 (L) 37.5 - 51.0 % Final   MCHC  Date Value Ref Range Status  04/22/2023 31.1 (L) 31.5 - 35.7 g/dL Final  94/96/7977 67.2 30.0 - 36.0 g/dL Final   [facility]  Date Value Ref Range Status  04/22/2023 26.2 (L) 26.6 - 33.0 pg Final  04/30/2021 30.6 26.0 - 34.0 pg Final   MCV  Date Value Ref Range Status  04/22/2023 84 79 - 97 fL Final   No results found for: PLTCOUNTKUC, LABPLAT, POCPLA RDW  Date Value Ref Range Status  04/22/2023 14.7 11.6 - 15.4 % Final

## 2024-01-21 ENCOUNTER — Other Ambulatory Visit: Payer: Self-pay | Admitting: Nurse Practitioner

## 2024-01-21 ENCOUNTER — Other Ambulatory Visit: Payer: Self-pay

## 2024-01-21 ENCOUNTER — Telehealth: Payer: Self-pay | Admitting: Family

## 2024-01-21 MED ORDER — FUROSEMIDE 20 MG PO TABS: 20.0000 mg | ORAL_TABLET | Freq: Every day | ORAL | 11 refills | Status: DC

## 2024-01-21 NOTE — Telephone Encounter (Signed)
Medication Refill -  Most Recent Primary Care Visit:  Provider: Larae Grooms  Department: [facility]  Visit Type: OFFICE VISIT  Date: 10/22/2023  Medication: omeprazole (PRILOSEC) 20 MG capsule   Has the patient contacted their pharmacy? No (Agent: If no, request that the patient contact the pharmacy for the refill. If patient does not wish to contact the pharmacy document the reason why and proceed with request.) (Agent: If yes, when and what did the pharmacy advise?)  Is this the correct pharmacy for this prescription? Yes If no, delete pharmacy and type the correct one.  This is the patient's preferred pharmacy:  [facility] #[id number] [facility],  - [address] AT [facility] OF [address] [place] [address] Phone: [phone] Fax: [phone]    Has the prescription been filled recently? Yes  Is the patient out of the medication? Yes  Has the patient been seen for an appointment in the last year OR does the patient have an upcoming appointment? Yes  Can we respond through MyChart? No  Agent: Please be advised that Rx refills may take up to 3 business days. We ask that you follow-up with your pharmacy.

## 2024-01-21 NOTE — Telephone Encounter (Signed)
Refill sent in for Lasix.

## 2024-01-27 ENCOUNTER — Telehealth: Payer: Self-pay

## 2024-01-27 DIAGNOSIS — K7030 Alcoholic cirrhosis of liver without ascites: Secondary | ICD-10-CM

## 2024-01-27 NOTE — Telephone Encounter (Signed)
Got patient follow up schedule for 03/16/2024 with Dr. Allegra Lai at 2:45pm. Order repeat Ultrasound for March last one was 09/11/2023  Got patient schedule for 03/11/2024 [place] medical mall at 8:00am for a 8:30am scan. Nothing to eat or drink after midnight. Printed reminders and sent to patient in the mail. Tried to call patient but voicemail is full

## 2024-01-27 NOTE — Telephone Encounter (Signed)
-----   Message from [facility] sent at 09/14/2023  8:57 AM EDT ----- Recommend repeat right upper quadrant ultrasound for HCC screening in 6 months

## 2024-01-28 NOTE — Telephone Encounter (Signed)
Tried to call patient but voicemail is full unable to leave a message

## 2024-02-01 NOTE — Telephone Encounter (Signed)
Called  patient and patient verbalized understanding of the time of his ultrasound and follow up appointment

## 2024-02-17 ENCOUNTER — Other Ambulatory Visit: Payer: Self-pay | Admitting: Nurse Practitioner

## 2024-02-17 NOTE — Telephone Encounter (Signed)
Copied from CRM [id number]. Topic: Clinical - Medication Refill >> Feb 17, 2024 12:53 PM Antony Haste wrote: Most Recent Primary Care Visit:  Provider: Larae Grooms  Department: [facility]  Visit Type: OFFICE VISIT  Date: 10/22/2023  Medication: metFORMIN (GLUCOPHAGE) 500 MG tablet  Has the patient contacted their pharmacy? No (Agent: If no, request that the patient contact the pharmacy for the refill. If patient does not wish to contact the pharmacy document the reason why and proceed with request.) (Agent: If yes, when and what did the pharmacy advise?)  Is this the correct pharmacy for this prescription? Yes If no, delete pharmacy and type the correct one.  This is the patient's preferred pharmacy:   [facility]ORE #[id number] [facility], [place] - [address] AT [place] [address] OF [address] [place] [address] Phone: [phone] Fax: [phone]   Has the prescription been filled recently? No  Is the patient out of the medication? Yes  Has the patient been seen for an appointment in the last year OR does the patient have an upcoming appointment? Yes  Can we respond through MyChart? No, callback preferred.  Agent: Please be advised that Rx refills may take up to 3 business days. We ask that you follow-up with your pharmacy.

## 2024-02-18 MED ORDER — METFORMIN HCL 500 MG PO TABS: 1000.0000 mg | ORAL_TABLET | Freq: Two times a day (BID) | ORAL | 1 refills | Status: DC

## 2024-02-18 NOTE — Telephone Encounter (Signed)
Requested Prescriptions  Pending Prescriptions Disp Refills   metFORMIN (GLUCOPHAGE) 500 MG tablet 180 tablet 1    Sig: Take 2 tablets (1,000 mg total) by mouth 2 (two) times daily with a meal.     Endocrinology:  Diabetes - Biguanides Failed - 02/18/2024 10:10 AM      Failed - B12 Level in normal range and within 720 days    No results found for: "VITAMINB12"       Passed - Cr in normal range and within 360 days    Creatinine, Ser  Date Value Ref Range Status  10/22/2023 0.82 0.76 - 1.27 mg/dL Final         Passed - HBA1C is between 0 and 7.9 and within 180 days    Hgb A1c MFr Bld  Date Value Ref Range Status  10/22/2023 6.2 (H) 4.8 - 5.6 % Final    Comment:             Prediabetes: 5.7 - 6.4          Diabetes: >6.4          Glycemic control for adults with diabetes: <7.0          Passed - eGFR in normal range and within 360 days    GFR calc Af Amer  Date Value Ref Range Status  02/13/2021 111 >59 mL/min/1.73 Final    Comment:    **In accordance with recommendations from the NKF-ASN Task force,**   [facility] is in the process of updating its eGFR calculation to the   2021 CKD-EPI creatinine equation that estimates kidney function   without a race variable.    GFR, Estimated  Date Value Ref Range Status  09/10/2022 >60 >60 mL/min Final    Comment:    (NOTE) Calculated using the CKD-EPI Creatinine Equation (2021)    eGFR  Date Value Ref Range Status  10/22/2023 101 >59 mL/min/1.73 Final         Passed - Valid encounter within last 6 months    Recent Outpatient Visits           3 months ago Alcoholic cirrhosis of liver without ascites (HCC)   [place] [facility] Larae Grooms, NP   10 months ago Welcome to [employer] preventive visit   [place] [facility] Larae Grooms, NP   1 year ago Chronic diastolic heart failure [facility])   [place] [facility] Larae Grooms, NP   1 year ago Chronic diastolic heart  failure [facility])   [place] [facility] Larae Grooms, NP   2 years ago Annual physical exam   [place] [facility] Larae Grooms, NP       Future Appointments             In 3 weeks Vanga, Loel Dubonnet, MD [facility] [place]   In 2 months Larae Grooms, NP  [facility]            Passed - CBC within normal limits and completed in the last 12 months    WBC  Date Value Ref Range Status  04/22/2023 7.8 3.4 - 10.8 x10E3/uL Final  04/30/2021 11.3 (H) 4.0 - 10.5 K/uL Final   RBC  Date Value Ref Range Status  04/22/2023 4.24 4.14 - 5.80 x10E6/uL Final  04/30/2021 3.24 (L) 4.22 - 5.81 MIL/uL Final   Hemoglobin  Date Value Ref Range Status  04/22/2023 11.1 (L) 13.0 -  17.7 g/dL Final   Hematocrit  Date Value Ref Range Status  04/22/2023 35.7 (L) 37.5 - 51.0 % Final   MCHC  Date Value Ref Range Status  04/22/2023 31.1 (L) 31.5 - 35.7 g/dL Final  11/91/4782 95.6 30.0 - 36.0 g/dL Final   [facility]  Date Value Ref Range Status  04/22/2023 26.2 (L) 26.6 - 33.0 pg Final  04/30/2021 30.6 26.0 - 34.0 pg Final   MCV  Date Value Ref Range Status  04/22/2023 84 79 - 97 fL Final   No results found for: "PLTCOUNTKUC", "LABPLAT", "POCPLA" RDW  Date Value Ref Range Status  04/22/2023 14.7 11.6 - 15.4 % Final

## 2024-03-11 ENCOUNTER — Ambulatory Visit
Admission: RE | Admit: 2024-03-11 | Discharge: 2024-03-11 | Disposition: A | Discharge status: Home / Self Care | Payer: Medicare HMO | Source: Ambulatory Visit | Attending: Gastroenterology | Admitting: Gastroenterology

## 2024-03-11 DIAGNOSIS — K7030 Alcoholic cirrhosis of liver without ascites: Secondary | ICD-10-CM | POA: Insufficient documentation

## 2024-03-15 ENCOUNTER — Telehealth: Payer: Self-pay | Admitting: Family

## 2024-03-15 NOTE — Telephone Encounter (Signed)
 Patient request samples of Entresto, he will be in the area for another appt tomorrow, He stated he is not eligible for asst. Pt can be reached @[phone]

## 2024-03-16 ENCOUNTER — Ambulatory Visit (INDEPENDENT_AMBULATORY_CARE_PROVIDER_SITE_OTHER): Payer: Medicare HMO | Admitting: Gastroenterology

## 2024-03-16 ENCOUNTER — Encounter: Payer: Self-pay | Admitting: Gastroenterology

## 2024-03-16 ENCOUNTER — Other Ambulatory Visit: Payer: Self-pay

## 2024-03-16 VITALS — BP 111/70 | HR 80 | Temp 98.0°F | Ht 67.0 in | Wt 206.5 lb

## 2024-03-16 DIAGNOSIS — Z8619 Personal history of other infectious and parasitic diseases: Secondary | ICD-10-CM | POA: Diagnosis not present

## 2024-03-16 DIAGNOSIS — K2950 Unspecified chronic gastritis without bleeding: Secondary | ICD-10-CM

## 2024-03-16 DIAGNOSIS — F1091 Alcohol use, unspecified, in remission: Secondary | ICD-10-CM

## 2024-03-16 DIAGNOSIS — K7030 Alcoholic cirrhosis of liver without ascites: Secondary | ICD-10-CM

## 2024-03-16 MED ORDER — ENTRESTO 24-26 MG PO TABS
1.0000 | ORAL_TABLET | Freq: Two times a day (BID) | ORAL | 3 refills | Status: AC
  Filled 2024-03-16: qty 180, 90d supply, fill #0
  Filled 2024-07-19: qty 180, 90d supply, fill #1
  Filled 2024-10-18: qty 180, 90d supply, fill #2
  Filled 2025-01-06: qty 180, 90d supply, fill #3

## 2024-03-16 NOTE — Progress Notes (Signed)
 Arlyss Repress, MD [id number]  [address]  [place], [place] [address]  Main: [phone]  Fax: [phone]    Gastroenterology Consultation  Referring Provider:     Larae Grooms, NP Primary Care Physician:  Larae Grooms, NP Primary Gastroenterologist:  Dr. Arlyss Repress Reason for Consultation:     Cirrhosis of liver        HPI:   Collin Byrd is a 63 y.o. male referred by Dr. Larae Grooms, NP  for consultation & management of cirrhosis of liver.  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  He also has history of pneumonia and pulmonary embolism, is currently on anticoagulation.  Patient is followed by hematologist, Dr. Donneta Romberg.  Patient underwent therapeutic paracentesis in June 2021, was diagnosed with SBP as well as fluid analysis consistent with portal hypertension.  He was treated with antibiotics and was discharged home on Lasix as well as spironolactone.  Patient stopped drinking alcohol since June and is adherent to his medications.  He reports doing well since then.  He denies swelling of abdomen, distention of abdomen, swelling of legs.  He denies black stools, rectal bleeding, hematemesis or abdominal pain.  His most recent labs revealed mild normocytic anemia, normal LFTs and renal function.  He is currently on oral iron, thiamine and folate. He is on disability, currently not working  CT chest 11/01/2020 revealed resolution of prior pulmonary embolus  Follow-up visit 09/10/2022 Patient is here for follow-up of cirrhosis.  He has been doing well from cirrhosis standpoint.  He continues to take spironolactone 100 mg daily and furosemide 40 mg daily.  He has gained about 10 pounds.  He denies any abdominal distention, abdominal discomfort, melena, rectal bleeding, nausea or vomiting.  He is leading sedentary lifestyle, not exercising or walking, does consume red meat regularly.  Patient does not have  any GI concerns today  Follow-up visit 09/15/2023 Patient is here for follow-up of cirrhosis.  His cirrhosis is well compensated.  He has been doing well.  He denies any abdominal distention, swelling, swelling of legs.  He denies any symptoms of melena, rectal bleeding.  He is taking spironolactone 50 mg and furosemide 20 mg as advised.  His hemoglobin A1c is improving, taking metformin 1000mg  twice daily.  Follow-up visit 03/16/2024 Collin Byrd is here for follow-up of cirrhosis.  He has been doing very well, lost about 10 pounds since last visit, his hemoglobin A1c has nicely improved from 7.3 to 6.2.  H. pylori breath test was negative and confirmed eradication.  He does not have any concerns today   NSAIDs: None  Antiplts/Anticoagulants/Anti thrombotics: Completed the treatment for Xarelto for history of PE after 6 months of anticoagulation  GI Procedures:  EGD and colonoscopy 01/03/2021 - Normal duodenal bulb and second portion of the duodenum. - Portal hypertensive gastropathy. Biopsied. - A single gastric polyp. Biopsied. Clip (MR conditional) was placed. - Small (< 5 mm) esophageal varices. - Esophagogastric landmarks identified. - Normal proximal esophagus, mid esophagus and gastroesophageal junction.  Procedure aborted due to large left inguinal hernia, advanced to transverse colon only The perianal and digital rectal examinations were normal. Pertinent negatives include normal sphincter tone and no palpable rectal lesions. Two sessile polyps were found in the descending colon and transverse colon. The polyps were 3 to 5 mm in size. These polyps were removed with a cold snare. Resection and retrieval were complete. Estimated blood loss: none. Non-bleeding external and  internal hemorrhoids were found during retroflexion. The hemorrhoids were large.  DIAGNOSIS:  A. STOMACH; COLD BIOPSY:  - MODERATE CHRONIC ACTIVE GASTRITIS.  - ANTRAL MUCOSA WITH FOCAL INTESTINAL METAPLASIA.  -  SIDEROSIS.  - SEE COMMENT.  - NEGATIVE FOR H.PYLORI, DYSPLASIA AND MALIGNANCY.   B.  STOMACH POLYPS; COLD BIOPSY:  - INFLAMED POLYPOID FRAGMENT OF MUCOSA WITH REACTIVE FOVEOLAR  HYPERPLASIA.  - NEGATIVE FOR H.PYLORI, CMV, DYSPLASIA AND MALIGNANCY.   C.  COLON POLYP X3, DESCENDING; COLD SNARE:  - TUBULAR ADENOMA (MULTIPLE FRAGMENTS).  - NEGATIVE FOR HIGH-GRADE DYSPLASIA AND MALIGNANCY.   Colonoscopy 09/18/2021 - Five 3 to 5 mm polyps in the descending colon, in the ascending colon and in the cecum, removed with a cold snare. Resected and retrieved. - Non-bleeding external and internal hemorrhoids. - Diverticulosis in the sigmoid colon and in the right colon. DIAGNOSIS:  A. COLON POLYPS X2, CECUM; COLD SNARE:  - MULTIPLE FRAGMENTS OF TUBULAR ADENOMAS.  - NEGATIVE FOR HIGH-GRADE DYSPLASIA AND MALIGNANCY.   B. COLON POLYP, ASCENDING; COLD SNARE:  - TUBULAR ADENOMA.  - NEGATIVE FOR HIGH-GRADE DYSPLASIA AND MALIGNANCY.   C. COLON POLYPS X2, DESCENDING; COLD SNARE:  - TUBULAR ADENOMA.  - NEGATIVE FOR HIGH-GRADE DYSPLASIA AND MALIGNANCY.   He denies family history of GI malignancy, liver cancer  Past Medical History:  Diagnosis Date   Alcohol abuse    Ascites    Ascites due to alcoholic cirrhosis (HCC)    CHF (congestive heart failure) (HCC)    Cirrhosis (HCC)    Diabetes mellitus without complication (HCC)    History of congestive heart failure 07/12/2020   History of methicillin resistant staphylococcus aureus (MRSA) 2016   Pneumonia 2021   Portal hypertension (HCC)    Pulmonary embolus (HCC)    SBP (spontaneous bacterial peritonitis) (HCC) 06/07/2020   Spontaneous bacterial peritonitis (HCC)     Past Surgical History:  Procedure Laterality Date   CATARACT EXTRACTION     COLONOSCOPY WITH PROPOFOL N/A 01/03/2021   Procedure: COLONOSCOPY WITH PROPOFOL;  Surgeon: Toney Reil, MD;  Location: [facility] ENDOSCOPY;  Service: Gastroenterology;  Laterality: N/A;   COLONOSCOPY  WITH PROPOFOL N/A 09/18/2021   Procedure: COLONOSCOPY WITH PROPOFOL;  Surgeon: Toney Reil, MD;  Location: [facility] ENDOSCOPY;  Service: Gastroenterology;  Laterality: N/A;   ESOPHAGOGASTRODUODENOSCOPY (EGD) WITH PROPOFOL N/A 01/03/2021   Procedure: ESOPHAGOGASTRODUODENOSCOPY (EGD) WITH PROPOFOL;  Surgeon: Toney Reil, MD;  Location: [facility] ENDOSCOPY;  Service: Gastroenterology;  Laterality: N/A;   EYE SURGERY     INGUINAL HERNIA REPAIR Left 04/11/2021   Procedure: HERNIA REPAIR INGUINAL ADULT, open, WITH DIAGNOSTIC LAPAROSCOPY, AND ROBOT ASSISTED;  Surgeon: Leafy Ro, MD;  Location: [facility] ORS;  Service: General;  Laterality: Left;  provider requesting 2 hours / 120 minutes for procedure.   PARACENTESIS  2021   TESTICULAR EXPLORATION Left 04/11/2021   Procedure: TESTICULAR EXPLORATION AND REMOVAL OF LEFT TESTICLE;  Surgeon: Sondra Come, MD;  Location: [facility] ORS;  Service: Urology;  Laterality: Left;   UMBILICAL HERNIA REPAIR N/A 04/04/2021   Procedure: HERNIA REPAIR UMBILICAL ADULT, open;  Surgeon: Leafy Ro, MD;  Location: [facility] ORS;  Service: General;  Laterality: N/A;   XI ROBOTIC ASSISTED INGUINAL HERNIA REPAIR WITH MESH Left 04/04/2021   Procedure: XI ROBOTIC ASSISTED INGUINAL HERNIA REPAIR WITH MESH;  Surgeon: Leafy Ro, MD;  Location: [facility] ORS;  Service: General;  Laterality: Left;    Current Outpatient Medications:    blood glucose meter kit and supplies  KIT, Dispense based on patient and insurance preference. Use up to four times daily as directed. (FOR ICD-9 250.00, 250.01)., Disp: 1 each, Rfl: 0   furosemide (LASIX) 20 MG tablet, Take 1 tablet (20 mg total) by mouth daily., Disp: 30 tablet, Rfl: 11   metFORMIN (GLUCOPHAGE) 500 MG tablet, Take 2 tablets (1,000 mg total) by mouth 2 (two) times daily with a meal., Disp: 180 tablet, Rfl: 1   Omega-3 Fatty Acids (FISH OIL) 1000 MG CAPS, Take 2 capsules by mouth in the morning and at bedtime., Disp: , Rfl:    omeprazole  (PRILOSEC) 20 MG capsule, Take 1 capsule (20 mg total) by mouth daily., Disp: 90 capsule, Rfl: 1   sacubitril-valsartan (ENTRESTO) 24-26 MG, Take 1 tablet by mouth 2 (two) times daily., Disp: 180 tablet, Rfl: 3   spironolactone (ALDACTONE) 50 MG tablet, Take 1 tablet (50 mg total) by mouth daily., Disp: 90 tablet, Rfl: 3   thiamine (VITAMIN B-1) 100 MG tablet, Take 100 mg by mouth daily., Disp: , Rfl:   No family history on file.   Social History   Tobacco Use   Smoking status: Every Day    Current packs/day: 0.25    Types: Cigarettes   Smokeless tobacco: Never  Vaping Use   Vaping status: Never Used  Substance Use Topics   Alcohol use: Not Currently   Drug use: Never    Allergies as of 03/16/2024   (No Known Allergies)    Review of Systems:    All systems reviewed and negative except where noted in HPI.   Physical Exam:  BP 111/70 (BP Location: Left Arm, Patient Position: Sitting, Cuff Size: Normal)   Pulse 80   Temp 98 F (36.7 C) (Oral)   Ht 5\' 7"  (1.702 m)   Wt 206 lb 8 oz (93.7 kg)   BMI 32.34 kg/m  No LMP for male patient.  General:   Alert,  Well-developed, well-nourished, pleasant and cooperative in NAD Head:  Normocephalic and atraumatic. Eyes:  Sclera clear, no icterus.   Conjunctiva pink.  Congested right eye Ears:  Normal auditory acuity. Nose:  No deformity, discharge, or lesions. Mouth:  No deformity or lesions,oropharynx pink & moist. Neck:  Supple; no masses or thyromegaly. Lungs:  Respirations even and unlabored.  Clear throughout to auscultation.   No wheezes, crackles, or rhonchi. No acute distress. Heart:  Regular rate and rhythm; no murmurs, clicks, rubs, or gallops. Abdomen:  Normal bowel sounds. Soft, non-tender and non-distended without masses, hepatosplenomegaly, large left inguinal hernia noted.  No guarding or rebound tenderness.   Rectal: Not performed Msk:  Symmetrical without gross deformities. Good, equal movement & strength  bilaterally. Pulses:  Normal pulses noted. Extremities:  No clubbing or edema.  No cyanosis. Neurologic:  Alert and oriented x3;  grossly normal neurologically. Skin:  Intact without significant lesions or rashes. No jaundice. Psych:  Alert and cooperative. Normal mood and affect.  Imaging Studies: Reviewed  Assessment and Plan:   Collin Byrd is a 63 y.o. Hispanic male with diabetes, decompensated alcoholic cirrhosis with ascites, history of SBP, pneumonia and pulmonary embolism s/p 6 months of anticoagulation on Xarelto is seen in consultation for follow-up of cirrhosis  Alcoholic cirrhosis of liver, child Pugh class B, low meld, currently well compensated Viral hepatitis panel negative, s/p Twinrix vaccine x3 doses Iron deficiency anemia has resolved, s/p iron replacement therapy Volume overload: Currently euvolemic.  He had history of ascites s/p therapeutic paracentesis in 6/21, consistent with portal  hypertension Continue spironolactone to 50 mg daily and continue Lasix 20 mg daily Continue low-sodium diet No evidence of esophageal or gastric varices HCC screening: AFP levels normal and CT did not reveal liver lesions in 6/21 and 4/22 right upper quadrant ultrasound every 6 months, up-to-date PSE: None Continue to remain abstinent from alcohol use Strongly advised patient to follow healthy diet, healthy eating habits and exercise, weight loss of at least 10 pounds in next 3 months  Moderate chronic active gastritis H. pylori IgG was positive, s/p treatment for H. pylori infection with triple therapy in 02/2021 H. pylori breath test in 9/24 confirmed eradication   Follow up annually or sooner if needed   Arlyss Repress, MD

## 2024-03-17 LAB — AFP TUMOR MARKER: AFP, Serum, Tumor Marker: <1.8 ng/mL (ref 0.0–8.4)

## 2024-03-21 ENCOUNTER — Telehealth: Payer: Self-pay

## 2024-03-21 NOTE — Telephone Encounter (Signed)
 Tried to call patient unable to leave a message because voicemail was full

## 2024-03-21 NOTE — Telephone Encounter (Signed)
-----   Message from [facility] sent at 03/21/2024 10:07 AM EDT ----- Cirrhosis of liver only with no focal lesions on the ultrasound Repeat ultrasound liver and serum AFP levels in 6 months for follow-up of liver lesions  RV

## 2024-03-22 NOTE — Telephone Encounter (Signed)
 Tried to call patient but voicemail is full

## 2024-03-23 NOTE — Telephone Encounter (Signed)
 Patient verbalized understanding of results and put a reminder for 6 months

## 2024-04-07 ENCOUNTER — Telehealth: Payer: Self-pay | Admitting: Family

## 2024-04-07 ENCOUNTER — Other Ambulatory Visit: Payer: Self-pay | Admitting: Nurse Practitioner

## 2024-04-07 MED ORDER — OMEPRAZOLE 20 MG PO CPDR: 20.0000 mg | DELAYED_RELEASE_CAPSULE | Freq: Every day | ORAL | 0 refills | Status: DC

## 2024-04-07 NOTE — Telephone Encounter (Signed)
 Requested Prescriptions  Pending Prescriptions Disp Refills   omeprazole (PRILOSEC) 20 MG capsule 90 capsule 0    Sig: Take 1 capsule (20 mg total) by mouth daily.     Gastroenterology: Proton Pump Inhibitors Failed - 04/07/2024  4:11 PM      Failed - Valid encounter within last 12 months    Recent Outpatient Visits   None     Future Appointments             In 1 week Larae Grooms, NP  [facility]

## 2024-04-07 NOTE — Telephone Encounter (Signed)
 Copied from CRM [id number]. Topic: Clinical - Medication Refill >> Apr 07, 2024  8:34 AM Patsy Lager T wrote: Most Recent Primary Care Visit:  Provider: Larae Grooms  Department: [id number]  Visit Type: OFFICE VISIT  Date: 10/22/2023  Medication: omeprazole (PRILOSEC) 20 MG capsule  Has the patient contacted their pharmacy? No  Is this the correct pharmacy for this prescription? Yes If no, delete pharmacy and type the correct one.  This is the patient's preferred pharmacy:  [id number] [place] - [address] AT [facility] [address] [place] [address] Phone: [phone] Fax: [phone]  Has the prescription been filled recently? Yes  Is the patient out of the medication? Yes  Has the patient been seen for an appointment in the last year OR does the patient have an upcoming appointment? Yes  Can we respond through MyChart? No  Agent: Please be advised that Rx refills may take up to 3 business days. We ask that you follow-up with your pharmacy.

## 2024-04-07 NOTE — Telephone Encounter (Signed)
 Pt has available refills on furosemide. Attempted to call patient, unable to reach.

## 2024-04-11 ENCOUNTER — Telehealth: Payer: Self-pay | Admitting: Family

## 2024-04-11 NOTE — Telephone Encounter (Incomplete)
 Called to confirm/remind patient of their appointment at the [facility] on 04/12/24***.   Appointment:   [] Confirmed  [] Left mess   [x] No answer/No voice mail  [] Phone not in service  Patient reminded to bring all medications and/or complete list.  Confirmed patient has transportation. Gave directions, instructed to utilize valet parking.

## 2024-04-11 NOTE — Progress Notes (Unsigned)
 [facility] Note   PCP: Aileen Alexanders, NP (last saw 04/24) Primary Cardiologist:  HPI:  Collin Byrd is a 63 y/o male with a history of DM, cataract, PE, cirrhosis, previous tobacco/ alcohol use and chronic heart failure.   Has not been admitted or been in the ED in the last 6 months.   Echo 06/05/20: EF of >55% along with moderate LVH.  Echo 07/17/22: EF of 65-70%.  Echo 10/06/23: EF 60-65% with trivial Collin  He presents today with a chief complaint of a follow-up visit. He has no symptoms and specifically denies shortness of breath, fatigue, chest pain, cough, palpitations, abdominal distention, pedal edema, dizziness, weight gain or difficulty sleeping.   He is here today for review of his echo.   Has not gotten his flu vaccine for this season.   ROS: All systems negative except as listed in HPI, PMH and Problem List.  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 Smoking- 1-3 cig/day. Lives in [place]; with wife. No children.    Social Drivers of Corporate investment banker Strain: Not on file  Food Insecurity: Not on file  Transportation Needs: Not on file  Physical Activity: Not on file  Stress: Not on file  Social Connections: Not on file  Intimate Partner Violence: Not on file    FH: No family history on file.  Past Medical  History:  Diagnosis Date   Alcohol abuse    Ascites    Ascites due to alcoholic cirrhosis (HCC)    CHF (congestive heart failure) (HCC)    Cirrhosis (HCC)    Diabetes mellitus without complication (HCC)    History of congestive heart failure 07/12/2020   History of methicillin resistant staphylococcus aureus (MRSA) 2016   Pneumonia 2021   Portal hypertension (HCC)    Pulmonary embolus (HCC)    SBP (spontaneous bacterial peritonitis) (HCC) 06/07/2020   Spontaneous bacterial peritonitis (HCC)     Current Outpatient Medications  Medication Sig Dispense Refill   blood glucose meter kit and supplies KIT Dispense based on patient and insurance preference. Use up to four times daily as directed. (FOR ICD-9 250.00, 250.01). 1 each 0   furosemide (LASIX) 20 MG tablet Take 1 tablet (20 mg total) by mouth daily. 30 tablet 11   metFORMIN (GLUCOPHAGE) 500 MG tablet Take 2 tablets (1,000 mg total) by mouth 2 (two) times daily with a meal. 180 tablet 1   Omega-3 Fatty Acids (FISH OIL) 1000 MG CAPS Take 2 capsules by mouth in the morning and at bedtime.     omeprazole (PRILOSEC) 20 MG capsule Take 1 capsule (20 mg total) by mouth daily. 90 capsule 0   sacubitril-valsartan (ENTRESTO) 24-26 MG Take 1 tablet by mouth  2 (two) times daily. 180 tablet 3   spironolactone (ALDACTONE) 50 MG tablet Take 1 tablet (50 mg total) by mouth daily. 90 tablet 3   thiamine (VITAMIN B-1) 100 MG tablet Take 100 mg by mouth daily.     No current facility-administered medications for this visit.   There were no vitals filed for this visit.  Wt Readings from Last 3 Encounters:  03/16/24 206 lb 8 oz (93.7 kg)  10/22/23 216 lb (98 kg)  10/13/23 217 lb (98.4 kg)   Lab Results  Component Value Date   CREATININE 0.82 10/22/2023   CREATININE 0.82 04/22/2023   CREATININE 0.92 01/23/2023   PHYSICAL EXAM:  General:  Well appearing. No resp difficulty HEENT: normal Neck: supple. JVP flat. No lymphadenopathy or  thryomegaly appreciated. Cor: PMI normal. Regular rate & rhythm. No rubs, gallops or murmurs. Lungs: clear Abdomen: soft, nontender, nondistended. No hepatosplenomegaly. No bruits or masses.  Extremities: no cyanosis, clubbing, rash, edema Neuro: alert & oriented x3, cranial nerves grossly intact. Moves all 4 extremities w/o difficulty. Affect pleasant.   ECG: not done   ASSESSMENT & PLAN:  1: NICM with preserved ejection fraction- - suspect due to previous alcohol use / DM - NYHA class I - euvolemic today - weighing daily; reminded to call for an overnight weight gain of >2 pounds or a weekly weight gain of >5 pounds - weight down 3 pounds from last visit here 6 weeks ago - BP 101/72 - Echo 06/05/20: EF of >55% along with moderate LVH.  - Echo 07/17/22: EF of 65-70%.  - Echo 10/06/23: EF 60-65% with trivial Collin (reviewed w/ patient today) - not adding salt to his food  - continue entresto 24/26mg  BID; BP does not allow for titration - continue furosemide 20mg  daily PRN - continue spironolactone 50mg  daily - BMP 04/22/23 showed sodium 137, potassium 4.0, creatinine 0.82 and GFR 101 - BNP 06/05/20 was 155.9  2: DM- - saw PCP Collin Byrd) 04/24 - continue metformin 1000mg  BID - A1c 07/23/22 was 6.2%  3: Alcoholic cirrhosis- - says that he hasn't had any alcohol since June 2021. - saw GI (Collin Byrd) 09/24 - abdominal ultrasound done 03/09/23; no liver lesions except cirrhosis - continue spironolactone 50mg  daily  Return in 6 months, sooner if needed.      Charlette Console, FNP 04/11/24

## 2024-04-12 ENCOUNTER — Telehealth: Payer: Self-pay

## 2024-04-12 ENCOUNTER — Ambulatory Visit: Payer: Medicare HMO | Attending: Family | Admitting: Family

## 2024-04-12 ENCOUNTER — Encounter: Payer: Self-pay | Admitting: Family

## 2024-04-12 ENCOUNTER — Other Ambulatory Visit (HOSPITAL_COMMUNITY): Payer: Self-pay

## 2024-04-12 VITALS — BP 108/72 | HR 71 | Wt 205.0 lb

## 2024-04-12 DIAGNOSIS — Z7984 Long term (current) use of oral hypoglycemic drugs: Secondary | ICD-10-CM | POA: Diagnosis not present

## 2024-04-12 DIAGNOSIS — K7030 Alcoholic cirrhosis of liver without ascites: Secondary | ICD-10-CM

## 2024-04-12 DIAGNOSIS — Z86711 Personal history of pulmonary embolism: Secondary | ICD-10-CM | POA: Diagnosis not present

## 2024-04-12 DIAGNOSIS — E119 Type 2 diabetes mellitus without complications: Secondary | ICD-10-CM

## 2024-04-12 DIAGNOSIS — Z79899 Other long term (current) drug therapy: Secondary | ICD-10-CM | POA: Insufficient documentation

## 2024-04-12 DIAGNOSIS — I428 Other cardiomyopathies: Secondary | ICD-10-CM | POA: Insufficient documentation

## 2024-04-12 DIAGNOSIS — Z87891 Personal history of nicotine dependence: Secondary | ICD-10-CM | POA: Insufficient documentation

## 2024-04-12 DIAGNOSIS — I5032 Chronic diastolic (congestive) heart failure: Secondary | ICD-10-CM | POA: Diagnosis present

## 2024-04-12 MED ORDER — DAPAGLIFLOZIN PROPANEDIOL 10 MG PO TABS: 10.0000 mg | ORAL_TABLET | Freq: Every day | ORAL | 1 refills | Status: DC

## 2024-04-12 NOTE — Telephone Encounter (Signed)
 Advanced Heart Failure Patient Advocate Encounter  Test billing for Collin Byrd and [name] shows copays of $4.80 for 90 days for each medication. No medication assistance needed at this time.  Kennis Peacock, CPhT Rx Patient Advocate Phone: [phone]

## 2024-04-12 NOTE — Patient Instructions (Signed)
 START taking Farxiga 10 MG tablet one time daily.  (30 day free card given today).  Go DOWN to LOWER LEVEL (LL) to have your blood work completed inside of [employer] office.  We will only call you if the results are abnormal or if the provider would like to make medication changes.

## 2024-04-12 NOTE — Progress Notes (Signed)
 [facility] - HE[facility] FAILURE CLINIC - PHARMACIST COUNSELING NOTE  Adherence Assessment  Do you ever forget to take your medication? [] Yes [x] No  Do you ever skip doses due to side effects?  [] Yes [x] No  Do you have trouble affording your medicines? [] Yes [x] No  Are you ever unable to pick up your medication due to transportation difficulties? [] Yes [x] No  Do you ever stop taking your medications because you don't believe they are helping? [] Yes [x] No  Do you check your weight daily?  [x] Yes [] No  Do you check your blood pressure daily?  [] Yes [x] No  Adherence strategy: Keeps meds organized in bag   Barriers to obtaining medications: None reported    Vital signs: HR 71, BP 108/72, weight 205 lbs.  ECHO: Date 10/06/23, EF 60-65% Renal function: Date 10/22/23, GFR > 60  Current Guideline-Directed Medical Therapy/Evidence Based Medicine  ACE/ARB/ARNI: Sacubitril-valsartan 24-26 mg twice daily Target dose: 97/103 mg twice daily  Beta Blocker:  N/A Target dose: N/A  Aldosterone Antagonist: Spironolactone 50 mg daily Target dose: 25-50 mg daily  SGLT2i:  N/A Target dose: N/A  Diuretic: Furosemide 20 mg daily PRN  ASSESSMENT 63 year old male who presents to the HF clinic for a follow-up appointment. PMH is significant for DM, cataract, PE, cirrhosis, previous tobacco/ alcohol use and chronic heart failure. No complaints or concerns for today's visit - says he is feeling well. Spoke to patient about possible addition of an SGLT2i to his regimen which he is open to today.   Recent ED visit or hospitalization (past 6 months): None  COUNSELING POINTS/CLINICAL PEARLS  Dapagliflozin (Goal: 10 mg daily) Inform patients that the most common adverse reactions are genital mycotic infections, nasopharyngitis (dapagliflozin), and urinary tract infections. Inform patients that hypotension may occur and advise them to contact their healthcare provider if they  experience such symptoms. Inform patients that dehydration may increase the risk for hypotension, and to have adequate fluid intake.   PLAN Continue current regimen as directed by NP Will plan to start Farxiga 10 mg daily  Consider eventual titration of Entresto to target dose if/when BP allows  Time spent: 20 minutes   Demetrius Mahler, PharmD Pharmacy Resident  04/14/2024 3:22 PM

## 2024-04-13 LAB — BASIC METABOLIC PANEL WITH GFR
BUN/Creatinine Ratio: 10 (ref 10–24)
BUN: 8 mg/dL (ref 8–27)
CO2: 24 mmol/L (ref 20–29)
Calcium: 9.3 mg/dL (ref 8.6–10.2)
Chloride: 99 mmol/L (ref 96–106)
Creatinine, Ser: 0.77 mg/dL (ref 0.76–1.27)
Glucose: 89 mg/dL (ref 70–99)
Potassium: 4 mmol/L (ref 3.5–5.2)
Sodium: 138 mmol/L (ref 134–144)
eGFR: 102 mL/min/{1.73_m2} (ref >59)

## 2024-04-20 ENCOUNTER — Ambulatory Visit (INDEPENDENT_AMBULATORY_CARE_PROVIDER_SITE_OTHER): Payer: Self-pay | Admitting: Nurse Practitioner

## 2024-04-20 ENCOUNTER — Encounter: Payer: Self-pay | Admitting: Nurse Practitioner

## 2024-04-20 VITALS — BP 107/68 | HR 71 | Temp 97.6°F | Wt 204.2 lb

## 2024-04-20 DIAGNOSIS — E1165 Type 2 diabetes mellitus with hyperglycemia: Secondary | ICD-10-CM | POA: Diagnosis not present

## 2024-04-20 DIAGNOSIS — E785 Hyperlipidemia, unspecified: Secondary | ICD-10-CM

## 2024-04-20 DIAGNOSIS — E781 Pure hyperglyceridemia: Secondary | ICD-10-CM

## 2024-04-20 DIAGNOSIS — I5032 Chronic diastolic (congestive) heart failure: Secondary | ICD-10-CM | POA: Diagnosis not present

## 2024-04-20 DIAGNOSIS — K7030 Alcoholic cirrhosis of liver without ascites: Secondary | ICD-10-CM

## 2024-04-20 DIAGNOSIS — E1169 Type 2 diabetes mellitus with other specified complication: Secondary | ICD-10-CM

## 2024-04-20 MED ORDER — METFORMIN HCL 500 MG PO TABS: 1000.0000 mg | ORAL_TABLET | Freq: Two times a day (BID) | ORAL | 1 refills | Status: DC

## 2024-04-20 MED ORDER — OMEPRAZOLE 20 MG PO CPDR: 20.0000 mg | DELAYED_RELEASE_CAPSULE | Freq: Every day | ORAL | 1 refills | Status: DC

## 2024-04-20 NOTE — Assessment & Plan Note (Signed)
 Chronic.  Controlled with Metformin  1000mg  BID and Farxiga .  Blood sugar today was 109.  Last A1c was 6.2% in April.  Microlabumin checked today.  Needs updated eye exam- discussed importance of eye exam with patient during visit today.  Labs ordered today.  Return to clinic in 6 months for reevaluation.  Call sooner if concerns arise.

## 2024-04-20 NOTE — Assessment & Plan Note (Signed)
 Labs ordered at visit today.  Will make recommendations based on lab results.

## 2024-04-20 NOTE — Progress Notes (Signed)
 BP 107/68   Pulse 71   Temp 97.6 F (36.4 C) (Oral)   Wt 204 lb 3.2 oz (92.6 kg)   SpO2 97%   BMI 31.98 kg/m    Subjective:    Patient ID: Collin Byrd, male    DOB: 1961/10/23, 63 y.o.   MRN: [id number]  HPI: Collin Byrd is a 63 y.o. male  Chief Complaint  Patient presents with   Diabetes   DIABETES Last A1c was 7.3 in April. Hypoglycemic episodes:no Polydipsia/polyuria: no Visual disturbance: no Chest pain: no Paresthesias: no Glucose Monitoring: yes  Accucheck frequency:  once weekly  Fasting glucose: 109  Post prandial:  Evening:  Before meals: Taking Insulin ?: no  Long acting insulin :  Short acting insulin : Blood Pressure Monitoring: daily Retinal Examination: no recent eye exam Foot Exam: Up to Date Diabetic Education: Not Completed Pneumovax: Up to Date Influenza: Up to Date Aspirin : no  HYPERTENSION / HYPERLIPIDEMIA- next appt with the HF clinic is in October.  Most Recent EF was 60-65% from October 06, 2023. Weighs himself daily.  On Entrestro and farxiga  Satisfied with current treatment? no Duration of hypertension: years BP monitoring frequency: not checking BP range:  BP medication side effects: no Past BP meds:  Entresto  and spironalactone, lasix  Duration of hyperlipidemia: years Cholesterol medication side effects: no Cholesterol supplements: none Past cholesterol medications: none Medication compliance: excellent compliance Aspirin : no Recent stressors: no Recurrent headaches: no Visual changes: no Palpitations: no Dyspnea: no Chest pain: no Lower extremity edema: no Dizzy/lightheaded: no  Seeing Dr. Baldomero Bone for Cirrohsis.  - up to date on visits.      Relevant past medical, surgical, family and social history reviewed and updated as indicated. Interim medical history since our last visit reviewed. Allergies and medications reviewed and updated.  Review of Systems  Constitutional:  Negative for unexpected weight change.   Eyes:  Negative for visual disturbance.  Respiratory:  Negative for chest tightness and shortness of breath.   Cardiovascular:  Negative for chest pain, palpitations and leg swelling.  Endocrine: Negative for polydipsia and polyuria.  Neurological:  Negative for dizziness, light-headedness, numbness and headaches.    Per HPI unless specifically indicated above     Objective:    BP 107/68   Pulse 71   Temp 97.6 F (36.4 C) (Oral)   Wt 204 lb 3.2 oz (92.6 kg)   SpO2 97%   BMI 31.98 kg/m   Wt Readings from Last 3 Encounters:  04/20/24 204 lb 3.2 oz (92.6 kg)  04/12/24 205 lb (93 kg)  03/16/24 206 lb 8 oz (93.7 kg)    Physical Exam Vitals and nursing note reviewed.  Constitutional:      General: He is not in acute distress.    Appearance: Normal appearance. He is obese. He is not ill-appearing, toxic-appearing or diaphoretic.  HENT:     Head: Normocephalic.     Right Ear: External ear normal.     Left Ear: External ear normal.     Nose: Nose normal. No congestion or rhinorrhea.     Mouth/Throat:     Mouth: Mucous membranes are moist.  Eyes:     General:        Right eye: No discharge.        Left eye: No discharge.     Extraocular Movements: Extraocular movements intact.     Conjunctiva/sclera: Conjunctivae normal.     Pupils: Pupils are equal, round, and reactive to light.  Cardiovascular:  Rate and Rhythm: Normal rate and regular rhythm.     Heart sounds: No murmur heard. Pulmonary:     Effort: Pulmonary effort is normal. No respiratory distress.     Breath sounds: Normal breath sounds. No wheezing, rhonchi or rales.  Abdominal:     General: Abdomen is flat. Bowel sounds are normal.  Musculoskeletal:     Cervical back: Normal range of motion and neck supple.  Skin:    General: Skin is warm and dry.     Capillary Refill: Capillary refill takes less than 2 seconds.  Neurological:     General: No focal deficit present.     Mental Status: He is alert and  oriented to person, place, and time.  Psychiatric:        Mood and Affect: Mood normal.        Behavior: Behavior normal.        Thought Content: Thought content normal.        Judgment: Judgment normal.     Results for orders placed or performed in visit on 04/12/24  Basic Metabolic Panel (BMET)   Collection Time: 04/12/24 11:52 AM  Result Value Ref Range   Glucose 89 70 - 99 mg/dL   BUN 8 8 - 27 mg/dL   Creatinine, Ser 1.61 0.76 - 1.27 mg/dL   eGFR 096 >04 VW/UJW/1.19   BUN/Creatinine Ratio 10 10 - 24   Sodium 138 134 - 144 mmol/L   Potassium 4.0 3.5 - 5.2 mmol/L   Chloride 99 96 - 106 mmol/L   CO2 24 20 - 29 mmol/L   Calcium  9.3 8.6 - 10.2 mg/dL      Assessment & Plan:   Problem List Items Addressed This Visit       Cardiovascular and Mediastinum   Chronic diastolic heart failure (HCC)   - NYHA class I - euvolemic today - weighing daily; reminded to call for an overnight weight gain of >2 pounds or a weekly weight gain of >5 pounds - weight up 11 pounds from last visit here 6 months ago - not adding salt to his food  - entresto  24/26mg  BID - furosemide  20mg  daily - spironolactone  100mg  daily -Continue to collaborate with HF clinic.        Digestive   Hepatic cirrhosis (HCC) - Primary   Chronic.  Stable.  Continue with current medication regimen.  Refrain from alcohol use.  Continue with Thiamine .  Followed by GI.  Reviewed recent note from GI.  Continues to follow up with Dr. Baldomero Bone.  No acute concerns at this time.  No ascites present on exam. Continue to collaborate with specialist.  Labs ordered today.  Return to clinic in 6 months for reevaluation.  Call sooner if concerns arise.          Endocrine   Type 2 diabetes mellitus with hyperglycemia, without long-term current use of insulin  (HCC)   Chronic.  Controlled with Metformin  1000mg  BID and Farxiga .  Blood sugar today was 109.  Last A1c was 6.2% in April.  Microlabumin checked today.  Needs updated eye  exam- discussed importance of eye exam with patient during visit today.  Labs ordered today.  Return to clinic in 6 months for reevaluation.  Call sooner if concerns arise.       Relevant Medications   metFORMIN  (GLUCOPHAGE ) 500 MG tablet   Other Relevant Orders   Comprehensive metabolic panel with GFR   Hemoglobin A1c   Hyperlipidemia associated with type 2 diabetes mellitus (  HCC)   Relevant Medications   metFORMIN  (GLUCOPHAGE ) 500 MG tablet   Other Relevant Orders   Lipid panel     Other   Hypertriglyceridemia   Labs ordered at visit today.  Will make recommendations based on lab results.       Relevant Orders   Lipid panel     Follow up plan: Return in about 6 months (around 10/20/2024) for Physical and Fasting labs, MWV.

## 2024-04-20 NOTE — Assessment & Plan Note (Signed)
Chronic.  Controlled.  Continue with current medication regimen.  Needs a statin. Will discuss at next visit.  Labs ordered today.  Return to clinic in 6 months for reevaluation.  Call sooner if concerns arise.

## 2024-04-20 NOTE — Assessment & Plan Note (Signed)
-   NYHA class I - euvolemic today - weighing daily; reminded to call for an overnight weight gain of >2 pounds or a weekly weight gain of >5 pounds - weight up 11 pounds from last visit here 6 months ago - not adding salt to his food  - entresto 24/26mg  BID - furosemide 20mg  daily - spironolactone 100mg  daily -Continue to collaborate with HF clinic.

## 2024-04-20 NOTE — Assessment & Plan Note (Signed)
Chronic.  Stable.  Continue with current medication regimen.  Refrain from alcohol use.  Continue with Thiamine.  Followed by GI.  Reviewed recent note from GI.  Continues to follow up with Dr. Allegra Lai.  No acute concerns at this time.  No ascites present on exam. Continue to collaborate with specialist.  Labs ordered today.  Return to clinic in 6 months for reevaluation.  Call sooner if concerns arise.

## 2024-04-21 LAB — LIPID PANEL
Chol/HDL Ratio: 3.8 ratio (ref 0.0–5.0)
Cholesterol, Total: 150 mg/dL (ref 100–199)
HDL: 39 mg/dL — ABNORMAL LOW (ref >39)
LDL Chol Calc (NIH): 94 mg/dL (ref 0–99)
Triglycerides: 90 mg/dL (ref 0–149)
VLDL Cholesterol Cal: 17 mg/dL (ref 5–40)

## 2024-04-21 LAB — COMPREHENSIVE METABOLIC PANEL WITH GFR
ALT: 13 IU/L (ref 0–44)
AST: 14 IU/L (ref 0–40)
Albumin: 4.1 g/dL (ref 3.9–4.9)
Alkaline Phosphatase: 65 IU/L (ref 44–121)
BUN/Creatinine Ratio: 13 (ref 10–24)
BUN: 11 mg/dL (ref 8–27)
Bilirubin Total: 1 mg/dL (ref 0.0–1.2)
CO2: 24 mmol/L (ref 20–29)
Calcium: 8.8 mg/dL (ref 8.6–10.2)
Chloride: 102 mmol/L (ref 96–106)
Creatinine, Ser: 0.83 mg/dL (ref 0.76–1.27)
Globulin, Total: 3.1 g/dL (ref 1.5–4.5)
Glucose: 97 mg/dL (ref 70–99)
Potassium: 4.1 mmol/L (ref 3.5–5.2)
Sodium: 138 mmol/L (ref 134–144)
Total Protein: 7.2 g/dL (ref 6.0–8.5)
eGFR: 100 mL/min/{1.73_m2} (ref >59)

## 2024-04-21 LAB — HEMOGLOBIN A1C
Est. average glucose Bld gHb Est-mCnc: 128 mg/dL
Hgb A1c MFr Bld: 6.1 % — ABNORMAL HIGH (ref 4.8–5.6)

## 2024-05-13 ENCOUNTER — Telehealth: Payer: Self-pay | Admitting: Family

## 2024-05-13 NOTE — Telephone Encounter (Signed)
 Called to confirm/remind patient of their appointment at the [facility] on 05/16/24.   Appointment:   [x] Confirmed  [] Left mess   [] No answer/No voice mail  [] VM Full/unable to leave message  [] Phone not in service  Patient reminded to bring all medications and/or complete list.  Confirmed patient has transportation. Gave directions, instructed to utilize valet parking.

## 2024-05-16 ENCOUNTER — Encounter: Payer: Self-pay | Admitting: Family

## 2024-05-16 ENCOUNTER — Ambulatory Visit: Admitting: Family

## 2024-05-16 ENCOUNTER — Ambulatory Visit: Payer: Self-pay | Admitting: Family

## 2024-05-16 ENCOUNTER — Other Ambulatory Visit
Admission: RE | Admit: 2024-05-16 | Discharge: 2024-05-16 | Disposition: A | Discharge status: Home / Self Care | Source: Ambulatory Visit | Attending: Family | Admitting: Family

## 2024-05-16 VITALS — BP 100/62 | HR 89 | Wt 203.4 lb

## 2024-05-16 DIAGNOSIS — E119 Type 2 diabetes mellitus without complications: Secondary | ICD-10-CM | POA: Diagnosis not present

## 2024-05-16 DIAGNOSIS — I5032 Chronic diastolic (congestive) heart failure: Secondary | ICD-10-CM | POA: Insufficient documentation

## 2024-05-16 DIAGNOSIS — K7030 Alcoholic cirrhosis of liver without ascites: Secondary | ICD-10-CM | POA: Diagnosis not present

## 2024-05-16 LAB — BASIC METABOLIC PANEL WITH GFR
Anion gap: 11 (ref 5–15)
BUN: 9 mg/dL (ref 8–23)
CO2: 24 mmol/L (ref 22–32)
Calcium: 9.2 mg/dL (ref 8.9–10.3)
Chloride: 103 mmol/L (ref 98–111)
Creatinine, Ser: 0.72 mg/dL (ref 0.61–1.24)
GFR, Estimated: >60 mL/min (ref >60)
Glucose, Bld: 83 mg/dL (ref 70–99)
Potassium: 3.5 mmol/L (ref 3.5–5.1)
Sodium: 138 mmol/L (ref 135–145)

## 2024-05-16 MED ORDER — FUROSEMIDE 20 MG PO TABS: 20.0000 mg | ORAL_TABLET | Freq: Every day | ORAL | 11 refills | Status: AC | PRN

## 2024-05-16 NOTE — Progress Notes (Signed)
 [facility] Note   PCP: Aileen Alexanders, NP (last saw 04/25) Primary Cardiologist: none  Chief complaint: HF visit  HPI:  Mr Buelow is a 63 y/o male with a history of DM, cataract, PE, cirrhosis, previous tobacco/ alcohol use and chronic heart failure.   Has not been admitted or been in the ED in the last 6 months.   Echo 06/05/20: EF of >55% along with moderate LVH.  Echo 07/17/22: EF of 65-70%.  Echo 10/06/23: EF 60-65% with trivial MR  Seen in [facility] 04/25 and farxgia 10mg  daily was started.   He presents today with a chief complaint of a follow-up visit. Currently is feeling well. Denies shortness of breath, fatigue, chest pain, palpitations, abdominal distention, pedal edema or dizziness. No issues with taking farxiga  that he's aware of.   ROS: All systems negative except as listed in HPI, PMH and Problem List.  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 Smoking- 1-3 cig/day. Lives in [place]; with wife. No children.    Social Drivers of Corporate investment banker Strain: Not on file  Food Insecurity: Not on file  Transportation Needs: Not on file  Physical Activity: Not on file  Stress: Not on file  Social Connections: Not on file  Intimate Partner Violence: Not on file    FH: No family history on file.  Past Medical History:   Diagnosis Date   Alcohol abuse    Ascites    Ascites due to alcoholic cirrhosis (HCC)    CHF (congestive heart failure) (HCC)    Cirrhosis (HCC)    Diabetes mellitus without complication (HCC)    History of congestive heart failure 07/12/2020   History of methicillin resistant staphylococcus aureus (MRSA) 2016   Pneumonia 2021   Portal hypertension (HCC)    Pulmonary embolus (HCC)    SBP (spontaneous bacterial peritonitis) (HCC) 06/07/2020   Spontaneous bacterial peritonitis (HCC)     Current Outpatient Medications  Medication Sig Dispense Refill   blood glucose meter kit and supplies KIT Dispense based on patient and insurance preference. Use up to four times daily as directed. (FOR ICD-9 250.00, 250.01). 1 each 0   dapagliflozin  propanediol (FARXIGA ) 10 MG TABS tablet Take 1 tablet (10 mg total) by mouth daily before breakfast. 90 tablet 1   furosemide  (LASIX ) 20 MG tablet Take 1 tablet (20 mg total) by mouth daily. 30 tablet 11   metFORMIN  (GLUCOPHAGE ) 500 MG tablet Take 2 tablets (1,000 mg total) by mouth 2 (two) times daily with a meal. 180 tablet 1   Omega-3 Fatty Acids (FISH OIL ) 1000 MG CAPS Take 2 capsules by mouth in the morning and at bedtime.     omeprazole  (PRILOSEC) 20 MG capsule Take 1  capsule (20 mg total) by mouth daily. 90 capsule 1   sacubitril -valsartan  (ENTRESTO ) 24-26 MG Take 1 tablet by mouth 2 (two) times daily. 180 tablet 3   spironolactone  (ALDACTONE ) 50 MG tablet Take 1 tablet (50 mg total) by mouth daily. 90 tablet 3   thiamine  (VITAMIN B-1) 100 MG tablet Take 100 mg by mouth daily.     No current facility-administered medications for this visit.   Vitals:   05/16/24 1131  BP: 100/62  Pulse: 89  SpO2: 97%  Weight: 203 lb 6.4 oz (92.3 kg)   Wt Readings from Last 3 Encounters:  05/16/24 203 lb 6.4 oz (92.3 kg)  04/20/24 204 lb 3.2 oz (92.6 kg)  04/12/24 205 lb (93 kg)   Lab Results  Component Value Date   CREATININE 0.72 05/16/2024   CREATININE  0.83 04/20/2024   CREATININE 0.77 04/12/2024    PHYSICAL EXAM:  General: Well appearing. No resp difficulty HEENT: normal Neck: supple, no JVD Cor: Regular rhythm, rate. No rubs, gallops or murmurs Lungs: clear Abdomen: soft, nontender, nondistended. Extremities: no cyanosis, clubbing, rash, trace pitting edema above sock line Neuro: alert & oriented X 3. Moves all 4 extremities w/o difficulty. Affect pleasant   ECG: not done   ASSESSMENT & PLAN:  1: NICM with preserved ejection fraction- - suspect due to previous alcohol use / DM - NYHA class I - euvolemic today - weighing daily; reminded to call for an overnight weight gain of >2 pounds or a weekly weight gain of >5 pounds - weight down 2 pounds from last visit here 1 month ago - Echo 06/05/20: EF of >55% along with moderate LVH.  - Echo 07/17/22: EF of 65-70%.  - Echo 10/06/23: EF 60-65% with trivial MR (reviewed w/ patient today) - not adding salt to his food  - continue farxiga  10mg  daily - due to BP, change furosemide  to 20mg  daily PRN for weight gain or edema  - continue entresto  24/26mg  BID - continue spironolactone  50mg  daily - BMP 04/20/24 reviewed: sodium 138, potassium 4.1, creatinine 0.83 and GFR 100 - BMET today  - BNP 06/05/20 was 155.9  2: DM- - saw PCP Curt Dover) 04/25 - continue metformin  1000mg  BID - A1c 04/20/24 was 6.1%  3: Alcoholic cirrhosis- - says that he hasn't had any alcohol since June 2021. - saw GI ([facility]) 03/25 - abdominal ultrasound done 03/09/23; no liver lesions except cirrhosis - continue spironolactone  50mg  daily   Return in 1 month to get established with HF MD, sooner if needed.   Charlette Console, FNP 05/16/24

## 2024-05-16 NOTE — Patient Instructions (Addendum)
 Medication Changes:  CHANGE Furosemide  to 1 tab daily AS NEEDED for swelling   Lab Work:  Go over to the [facility]. Go pass the gift shop and have your blood work completed.  We will only call you if the results are abnormal or if the provider would like to make medication changes.   Follow-Up in: Please follow up with the [facility] in 1 month with Dr. Alease Amend.  At the [facility], you and your health needs are our priority. We have a designated team specialized in the treatment of Heart Failure. This Care Team includes your primary Heart Failure Specialized Cardiologist (physician), Advanced Practice Providers (APPs- Physician Assistants and Nurse Practitioners), and Pharmacist who all work together to provide you with the care you need, when you need it.   You may see any of the following providers on your designated Care Team at your next follow up:  Dr. Jules Oar Dr. Peder Bourdon Dr. Alwin Baars Dr. Judyth Nunnery Shawnee Dellen, FNP Bevely Brush, RPH-CPP  Please be sure to bring in all your medications bottles to every appointment.   Need to Contact Us :  If you have any questions or concerns before your next appointment please send us  a message through [place] or call our office at [phone].    TO LEAVE A MESSAGE FOR THE NURSE SELECT OPTION 2, PLEASE LEAVE A MESSAGE INCLUDING: YOUR NAME DATE OF BIRTH CALL BACK NUMBER REASON FOR CALL**this is important as we prioritize the call backs  YOU WILL RECEIVE A CALL BACK THE SAME DAY AS LONG AS YOU CALL BEFORE 4:00 PM

## 2024-05-26 ENCOUNTER — Other Ambulatory Visit: Payer: Self-pay | Admitting: Nurse Practitioner

## 2024-05-26 NOTE — Telephone Encounter (Signed)
 Copied from CRM [id number]. Topic: Clinical - Medication Refill >> May 26, 2024  8:06 AM Jalayah J wrote: Medication: metFORMIN  (GLUCOPHAGE ) 500 MG tablet  Has the patient contacted their pharmacy? Yes (Agent: If no, request that the patient contact the pharmacy for the refill. If patient does not wish to contact the pharmacy document the reason why and proceed with request.) (Agent: If yes, when and what did the pharmacy advise?)  This is the patient's preferred pharmacy:  [id number], [place] - [address] AT [facility] OF [address] [place] [address] Phone: [phone] Fax: [phone]    Is this the correct pharmacy for this prescription? Yes If no, delete pharmacy and type the correct one.   Has the prescription been filled recently? Yes  Is the patient out of the medication? Yes  Has the patient been seen for an appointment in the last year OR does the patient have an upcoming appointment? Yes  Can we respond through MyChart? No  Agent: Please be advised that Rx refills may take up to 3 business days. We ask that you follow-up with your pharmacy.

## 2024-05-27 NOTE — Telephone Encounter (Signed)
 Requested Prescriptions  Refused Prescriptions Disp Refills   metFORMIN  (GLUCOPHAGE ) 500 MG tablet 180 tablet 1    Sig: Take 2 tablets (1,000 mg total) by mouth 2 (two) times daily with a meal.     Endocrinology:  Diabetes - Biguanides Failed - 05/27/2024  5:20 PM      Failed - B12 Level in normal range and within 720 days    No results found for: "VITAMINB12"       Failed - CBC within normal limits and completed in the last 12 months    WBC  Date Value Ref Range Status  04/22/2023 7.8 3.4 - 10.8 x10E3/uL Final  04/30/2021 11.3 (H) 4.0 - 10.5 K/uL Final   RBC  Date Value Ref Range Status  04/22/2023 4.24 4.14 - 5.80 x10E6/uL Final  04/30/2021 3.24 (L) 4.22 - 5.81 MIL/uL Final   Hemoglobin  Date Value Ref Range Status  04/22/2023 11.1 (L) 13.0 - 17.7 g/dL Final   Hematocrit  Date Value Ref Range Status  04/22/2023 35.7 (L) 37.5 - 51.0 % Final   MCHC  Date Value Ref Range Status  04/22/2023 31.1 (L) 31.5 - 35.7 g/dL Final  16/09/9603 54.0 30.0 - 36.0 g/dL Final   [facility]  Date Value Ref Range Status  04/22/2023 26.2 (L) 26.6 - 33.0 pg Final  04/30/2021 30.6 26.0 - 34.0 pg Final   MCV  Date Value Ref Range Status  04/22/2023 84 79 - 97 fL Final   No results found for: "PLTCOUNTKUC", "LABPLAT", "POCPLA" RDW  Date Value Ref Range Status  04/22/2023 14.7 11.6 - 15.4 % Final         Passed - Cr in normal range and within 360 days    Creatinine, Ser  Date Value Ref Range Status  05/16/2024 0.72 0.61 - 1.24 mg/dL Final         Passed - HBA1C is between 0 and 7.9 and within 180 days    Hgb A1c MFr Bld  Date Value Ref Range Status  04/20/2024 6.1 (H) 4.8 - 5.6 % Final    Comment:             Prediabetes: 5.7 - 6.4          Diabetes: >6.4          Glycemic control for adults with diabetes: <7.0          Passed - eGFR in normal range and within 360 days    GFR calc Af Amer  Date Value Ref Range Status  02/13/2021 111 >59 mL/min/1.73 Final    Comment:    **In  accordance with recommendations from the NKF-ASN Task force,**   [facility] is in the process of updating its eGFR calculation to the   2021 CKD-EPI creatinine equation that estimates kidney function   without a race variable.    GFR, Estimated  Date Value Ref Range Status  05/16/2024 >60 >60 mL/min Final    Comment:    (NOTE) Calculated using the CKD-EPI Creatinine Equation (2021)    eGFR  Date Value Ref Range Status  04/20/2024 100 >59 mL/min/1.73 Final         Passed - Valid encounter within last 6 months    Recent Outpatient Visits           1 month ago Alcoholic cirrhosis of liver without ascites [facility])   [facility] Aileen Alexanders, NP

## 2024-06-01 ENCOUNTER — Other Ambulatory Visit: Payer: Self-pay

## 2024-06-01 MED ORDER — FISH OIL 1000 MG PO CAPS
2.0000 | ORAL_CAPSULE | Freq: Two times a day (BID) | ORAL | 5 refills | Status: AC
  Filled 2024-06-01: qty 120, 30d supply, fill #0
  Filled 2024-10-18: qty 100, 25d supply, fill #0
  Filled 2025-01-31: qty 120, 30d supply, fill #1

## 2024-06-01 NOTE — Telephone Encounter (Signed)
 Pt arrived in clinic and asked that he get a refill on his fish oil  pills. Refilled previous order to preferred pharmacy.

## 2024-06-02 ENCOUNTER — Ambulatory Visit

## 2024-06-02 VITALS — Ht 67.0 in | Wt 203.0 lb

## 2024-06-02 DIAGNOSIS — Z0000 Encounter for general adult medical examination without abnormal findings: Secondary | ICD-10-CM

## 2024-06-02 DIAGNOSIS — Z1211 Encounter for screening for malignant neoplasm of colon: Secondary | ICD-10-CM

## 2024-06-02 NOTE — Patient Instructions (Addendum)
 Collin Byrd , Thank you for taking time out of your busy schedule to complete your Annual Wellness Visit with me. I enjoyed our conversation and look forward to speaking with you again next year. I, as well as your care team,  appreciate your ongoing commitment to your health goals. Please review the following plan we discussed and let me know if I can assist you in the future. Your Game plan/ To Do List    Referrals: If you haven't heard from the office you've been referred to, please reach out to them at the phone provided.   Follow up Visits: Next Medicare AWV with our clinical staff: 06/13/25 @ 10:40a   Have you seen your provider in the last 6 months (3 months if uncontrolled diabetes)?  Next Office Visit with your provider: 10/20/24 @ 10a  Clinician Recommendations:  Aim for 30 minutes of exercise or brisk walking, 6-8 glasses of water , and 5 servings of fruits and vegetables each day.        This is a list of the screening recommended for you and due dates:  Health Maintenance  Topic Date Due   Zoster (Shingles) Vaccine (1 of 2) Never done   COVID-19 Vaccine (1 - 2024-25 season) Never done   Colon Cancer Screening  09/18/2024   DTaP/Tdap/Td vaccine (1 - Tdap) 10/21/2024*   Pneumococcal Vaccination (2 of 2 - PCV) 04/20/2025*   Flu Shot  07/29/2024   Hemoglobin A1C  10/20/2024   Yearly kidney health urinalysis for diabetes  10/21/2024   Complete foot exam   10/21/2024   Eye exam for diabetics  12/09/2024   Yearly kidney function blood test for diabetes  05/16/2025   Medicare Annual Wellness Visit  06/02/2025   Hepatitis C Screening  Completed   HIV Screening  Completed   HPV Vaccine  Aged Out   Meningitis B Vaccine  Aged Out  *Topic was postponed. The date shown is not the original due date.    Advanced directives: (In Chart) A copy of your advanced directives are scanned into your chart should your provider ever need it. Advance Care Planning is important because it:  [x]   Makes sure you receive the medical care that is consistent with your values, goals, and preferences  [x]  It provides guidance to your family and loved ones and reduces their decisional burden about whether or not they are making the right decisions based on your wishes.  Follow the link provided in your after visit summary or read over the paperwork we have mailed to you to help you started getting your Advance Directives in place. If you need assistance in completing these, please reach out to us  so that we can help you!  See attachments for Preventive Care and Fall Prevention Tips.

## 2024-06-02 NOTE — Progress Notes (Signed)
 Subjective:   Collin Byrd is a 63 y.o. who presents for a Medicare Wellness preventive visit.  As a reminder, Annual Wellness Visits don't include a physical exam, and some assessments may be limited, especially if this visit is performed virtually. We may recommend an in-person follow-up visit with your provider if needed.  Visit Complete: Virtual I connected with  Collin Byrd on 06/02/24 by a audio enabled telemedicine application and verified that I am speaking with the correct person using two identifiers.  Patient Location: Home  Provider Location: Home Office  I discussed the limitations of evaluation and management by telemedicine. The patient expressed understanding and agreed to proceed.  Vital Signs: Because this visit was a virtual/telehealth visit, some criteria may be missing or patient reported. Any vitals not documented were not able to be obtained and vitals that have been documented are patient reported.    Persons Participating in Visit: Patient.  AWV Questionnaire: No: Patient Medicare AWV questionnaire was not completed prior to this visit.  Cardiac Risk Factors include: advanced age (>62men, >48 women);male gender;diabetes mellitus     Objective:     Today's Vitals   06/02/24 1043 06/02/24 1044  Weight: 203 lb (92.1 kg)   Height: 5\' 7"  (1.702 m)   PainSc:  0-No pain   Body mass index is 31.79 kg/m.     09/18/2021   10:30 AM 04/30/2021    1:19 PM 04/11/2021    9:00 PM 04/07/2021    8:21 PM 04/04/2021    6:17 AM 01/03/2021    8:47 AM 10/23/2020   10:51 AM  Advanced Directives  Does Patient Have a Medical Advance Directive? No No Unable to assess, patient is non-responsive or altered mental status No No No No  Does patient want to make changes to medical advance directive?  No - Patient declined       Would patient like information on creating a medical advance directive? No - Patient declined  No - Patient declined No - Patient declined No -  Patient declined No - Patient declined     Current Medications (verified) Outpatient Encounter Medications as of 06/02/2024  Medication Sig   blood glucose meter kit and supplies KIT Dispense based on patient and insurance preference. Use up to four times daily as directed. (FOR ICD-9 250.00, 250.01).   dapagliflozin  propanediol (FARXIGA ) 10 MG TABS tablet Take 1 tablet (10 mg total) by mouth daily before breakfast.   furosemide  (LASIX ) 20 MG tablet Take 1 tablet (20 mg total) by mouth daily as needed for fluid or edema.   Melatonin 3 MG CAPS Take 5 mg by mouth at bedtime.   metFORMIN  (GLUCOPHAGE ) 500 MG tablet Take 2 tablets (1,000 mg total) by mouth 2 (two) times daily with a meal.   Omega-3 Fatty Acids (FISH OIL ) 1000 MG CAPS Take 2 capsules (2,000 mg total) by mouth in the morning and at bedtime.   omeprazole  (PRILOSEC) 20 MG capsule Take 1 capsule (20 mg total) by mouth daily.   sacubitril -valsartan  (ENTRESTO ) 24-26 MG Take 1 tablet by mouth 2 (two) times daily.   spironolactone  (ALDACTONE ) 50 MG tablet Take 1 tablet (50 mg total) by mouth daily.   thiamine  (VITAMIN B-1) 100 MG tablet Take 100 mg by mouth daily.   No facility-administered encounter medications on file as of 06/02/2024.    Allergies (verified) Patient has no known allergies.   History: Past Medical History:  Diagnosis Date   Alcohol abuse    Ascites  Ascites due to alcoholic cirrhosis (HCC)    CHF (congestive heart failure) (HCC)    Cirrhosis (HCC)    Diabetes mellitus without complication (HCC)    History of congestive heart failure 07/12/2020   History of methicillin resistant staphylococcus aureus (MRSA) 2016   Pneumonia 2021   Portal hypertension (HCC)    Pulmonary embolus (HCC)    SBP (spontaneous bacterial peritonitis) (HCC) 06/07/2020   Spontaneous bacterial peritonitis (HCC)    Past Surgical History:  Procedure Laterality Date   CATARACT EXTRACTION     COLONOSCOPY WITH PROPOFOL  N/A 01/03/2021    Procedure: COLONOSCOPY WITH PROPOFOL ;  Surgeon: Selena Daily, MD;  Location: [facility] ENDOSCOPY;  Service: Gastroenterology;  Laterality: N/A;   COLONOSCOPY WITH PROPOFOL  N/A 09/18/2021   Procedure: COLONOSCOPY WITH PROPOFOL ;  Surgeon: Selena Daily, MD;  Location: [facility] ENDOSCOPY;  Service: Gastroenterology;  Laterality: N/A;   ESOPHAGOGASTRODUODENOSCOPY (EGD) WITH PROPOFOL  N/A 01/03/2021   Procedure: ESOPHAGOGASTRODUODENOSCOPY (EGD) WITH PROPOFOL ;  Surgeon: Selena Daily, MD;  Location: [facility] ENDOSCOPY;  Service: Gastroenterology;  Laterality: N/A;   EYE SURGERY     INGUINAL HERNIA REPAIR Left 04/11/2021   Procedure: HERNIA REPAIR INGUINAL ADULT, open, WITH DIAGNOSTIC LAPAROSCOPY, AND ROBOT ASSISTED;  Surgeon: Alben Alma, MD;  Location: [facility] ORS;  Service: General;  Laterality: Left;  provider requesting 2 hours / 120 minutes for procedure.   PARACENTESIS  2021   TESTICULAR EXPLORATION Left 04/11/2021   Procedure: TESTICULAR EXPLORATION AND REMOVAL OF LEFT TESTICLE;  Surgeon: Lawerence Pressman, MD;  Location: [facility] ORS;  Service: Urology;  Laterality: Left;   UMBILICAL HERNIA REPAIR N/A 04/04/2021   Procedure: HERNIA REPAIR UMBILICAL ADULT, open;  Surgeon: Alben Alma, MD;  Location: [facility] ORS;  Service: General;  Laterality: N/A;   XI ROBOTIC ASSISTED INGUINAL HERNIA REPAIR WITH MESH Left 04/04/2021   Procedure: XI ROBOTIC ASSISTED INGUINAL HERNIA REPAIR WITH MESH;  Surgeon: Alben Alma, MD;  Location: [facility] ORS;  Service: General;  Laterality: Left;   History reviewed. No pertinent family history. Social History   Socioeconomic History   Marital status: Media planner    Spouse name: Not on file   Number of children: Not on file   Years of education: Not on file   Highest education level: Not on file  Occupational History   Not on file  Tobacco Use   Smoking status: Every Day    Current packs/day: 0.25    Types: Cigarettes   Smokeless tobacco: Never  Vaping Use    Vaping status: Never Used  Substance and Sexual Activity   Alcohol use: Not Currently   Drug use: Never   Sexual activity: Not Currently  Other Topics Concern   Not on file  Social History Narrative   No working now; used to work housekeeping; 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 Smoking- 1-3 cig/day. Lives in [place]; with wife. No children.    Social Drivers of Corporate investment banker Strain: Low Risk  (06/02/2024)   Overall Financial Resource Strain (CARDIA)    Difficulty of Paying Living Expenses: Not hard at all  Food Insecurity: No Food Insecurity (06/02/2024)   Hunger Vital Sign    Worried About Running Out of Food in the Last Year: Never true    Ran Out of Food in the Last Year: Never true  Transportation Needs: No Transportation Needs (06/02/2024)   PRAPARE - Administrator, Civil Service (Medical): No    Lack of Transportation (Non-Medical): No  Physical Activity: Sufficiently Active (06/02/2024)   Exercise Vital Sign    Days of Exercise per Week: 3 days    Minutes of Exercise per Session: 60 min  Stress: No Stress Concern Present (06/02/2024)   [employer] of Occupational Health - Occupational Stress Questionnaire    Feeling of Stress : Not at all  Social Connections: Moderately Integrated (06/02/2024)   Social Connection and Isolation Panel [NHANES]    Frequency of Communication with Friends and Family: More than three times a week    Frequency of Social Gatherings with Friends and Family: More than three times a week    Attends Religious Services: More than 4 times per year    Active Member of [employer] or Organizations: Yes    Attends Engineer, structural: More than 4 times per year    Marital Status: Never married    Tobacco Counseling Ready to quit: No Counseling given: Yes    Clinical Intake:  Pre-visit preparation completed: Yes  Pain : No/denies pain Pain Score: 0-No pain     BMI - recorded: 31.79 Nutritional Status: BMI > 30   Obese Nutritional Risks: None Diabetes: Yes CBG done?: No Did pt. bring in CBG monitor from home?: No  Lab Results  Component Value Date   HGBA1C 6.1 (H) 04/20/2024   HGBA1C 6.2 (H) 10/22/2023   HGBA1C 7.3 (H) 04/22/2023     How often do you need to have someone help you when you read instructions, pamphlets, or other written materials from your doctor or pharmacy?: 1 - Never  Interpreter Needed?: No  Information entered by :: Farris Hong LPN   Activities of Daily Living     06/02/2024   10:50 AM  In your present state of health, do you have any difficulty performing the following activities:  Hearing? 0  Vision? 0  Difficulty concentrating or making decisions? 0  Walking or climbing stairs? 0  Dressing or bathing? 0  Doing errands, shopping? 0  Preparing Food and eating ? N  Using the Toilet? N  In the past six months, have you accidently leaked urine? N  Do you have problems with loss of bowel control? N  Managing your Medications? N  Managing your Finances? N  Housekeeping or managing your Housekeeping? N    Patient Care Team: Aileen Alexanders, NP as PCP - General (Nurse Practitioner)  I have updated your Care Teams any recent Medical Services you may have received from other providers in the past year.     Assessment:    This is a routine wellness examination for Collin Byrd.  Hearing/Vision screen Hearing Screening - Comments:: Denies hearing difficulties   Vision Screening - Comments:: Wears rx glasses - up to date with routine eye exams with   [facility]   Goals Addressed               This Visit's Progress     Increase physical activity (pt-stated)        Remain active.       Depression Screen     06/02/2024   10:49 AM 04/20/2024   10:38 AM 10/22/2023   10:44 AM 01/23/2023    9:19 AM 08/26/2022   12:32 PM 07/17/2022    1:35 PM 06/11/2022   11:52 AM  PHQ 2/9 Scores  PHQ - 2 Score 0 2 0 0 1 1 1   PHQ- 9 Score 0 3 0 0       Fall Risk  06/02/2024   10:51 AM 04/20/2024   10:38 AM 10/22/2023   10:44 AM 04/22/2023    1:14 PM 01/23/2023    9:19 AM  Fall Risk   Falls in the past year? 0 0 0 0 0  Number falls in past yr: 0 0 0 0 0  Injury with Fall? 0 0 0 0 0  Risk for fall due to : No Fall Risks No Fall Risks No Fall Risks No Fall Risks No Fall Risks  Follow up Falls evaluation completed Falls evaluation completed Falls evaluation completed Falls evaluation completed Falls evaluation completed    MEDICARE RISK AT HOME:  Medicare Risk at Home Any stairs in or around the home?: Yes If so, are there any without handrails?: No Home free of loose throw rugs in walkways, pet beds, electrical cords, etc?: Yes Adequate lighting in your home to reduce risk of falls?: Yes Life alert?: No Use of a cane, walker or w/c?: No Grab bars in the bathroom?: Yes Shower chair or bench in shower?: No Elevated toilet seat or a handicapped toilet?: No  TIMED UP AND GO:  Was the test performed?  No  Cognitive Function: 6CIT completed        06/02/2024   10:53 AM 04/22/2023    1:16 PM  6CIT Screen  What Year? 0 points 0 points  What month? 0 points 0 points  What time? 0 points 3 points  Count back from 20 0 points 0 points  Months in reverse 0 points 0 points  Repeat phrase 0 points 8 points  Total Score 0 points 11 points    Immunizations Immunization History  Administered Date(s) Administered   Hep A / Hep B 02/26/2021, 03/27/2021, 08/29/2021   Pneumococcal Polysaccharide-23 10/21/2021    Screening Tests Health Maintenance  Topic Date Due   Zoster Vaccines- Shingrix (1 of 2) Never done   COVID-19 Vaccine (1 - 2024-25 season) Never done   Colonoscopy  09/18/2024   DTaP/Tdap/Td (1 - Tdap) 10/21/2024 (Originally 03/27/1980)   Pneumococcal Vaccine 23-59 Years old (2 of 2 - PCV) 04/20/2025 (Originally 10/21/2022)   INFLUENZA VACCINE  07/29/2024   HEMOGLOBIN A1C  10/20/2024   Diabetic kidney evaluation - Urine ACR   10/21/2024   FOOT EXAM  10/21/2024   OPHTHALMOLOGY EXAM  12/09/2024   Diabetic kidney evaluation - eGFR measurement  05/16/2025   Medicare Annual Wellness (AWV)  06/02/2025   Hepatitis C Screening  Completed   HIV Screening  Completed   HPV VACCINES  Aged Out   Meningococcal B Vaccine  Aged Out    Health Maintenance  Health Maintenance Due  Topic Date Due   Zoster Vaccines- Shingrix (1 of 2) Never done   COVID-19 Vaccine (1 - 2024-25 season) Never done   Colonoscopy  09/18/2024   Health Maintenance Items Addressed: Referral sent to GI for colonoscopy  Additional Screening:  Vision Screening: Recommended annual ophthalmology exams for early detection of glaucoma and other disorders of the eye. Would you like a referral to an eye doctor? No    Dental Screening: Recommended annual dental exams for proper oral hygiene  Community Resource Referral / Chronic Care Management: CRR required this visit?  No   CCM required this visit?  No   Plan:    I have personally reviewed and noted the following in the patient's chart:   Medical and social history Use of alcohol, tobacco or illicit drugs  Current medications and supplements including opioid prescriptions. Patient is  not currently taking opioid prescriptions. Functional ability and status Nutritional status Physical activity Advanced directives List of other physicians Hospitalizations, surgeries, and ER visits in previous 12 months Vitals Screenings to include cognitive, depression, and falls Referrals and appointments  In addition, I have reviewed and discussed with patient certain preventive protocols, quality metrics, and best practice recommendations. A written personalized care plan for preventive services as well as general preventive health recommendations were provided to patient.   Dewayne Ford, LPN   01/03/1095   After Visit Summary: (MyChart) Due to this being a telephonic visit, the after visit summary  with patients personalized plan was offered to patient via MyChart   Notes: Nothing significant to report at this time.

## 2024-06-06 ENCOUNTER — Telehealth: Payer: Self-pay | Admitting: Cardiology

## 2024-06-06 NOTE — Telephone Encounter (Signed)
 Called to confirm/remind patient of their appointment at the [facility] on 06/07/24.   Appointment:   [x] Confirmed  [] Left mess   [] No answer/No voice mail  [] VM Full/unable to leave message  [] Phone not in service  Patient reminded to bring all medications and/or complete list.  Confirmed patient has transportation. Gave directions, instructed to utilize valet parking.

## 2024-06-07 ENCOUNTER — Ambulatory Visit: Attending: Cardiology | Admitting: Cardiology

## 2024-06-07 ENCOUNTER — Encounter: Payer: Self-pay | Admitting: Cardiology

## 2024-06-07 VITALS — BP 106/67 | HR 90 | Wt 207.1 lb

## 2024-06-07 DIAGNOSIS — I5032 Chronic diastolic (congestive) heart failure: Secondary | ICD-10-CM

## 2024-06-07 DIAGNOSIS — K7030 Alcoholic cirrhosis of liver without ascites: Secondary | ICD-10-CM | POA: Diagnosis not present

## 2024-06-07 DIAGNOSIS — Z008 Encounter for other general examination: Secondary | ICD-10-CM | POA: Insufficient documentation

## 2024-06-07 NOTE — Patient Instructions (Signed)
 No changes today!  Follow-Up in: Please follow up with the [facility] Failure Clinic in 1 year with Shawnee Dellen, FNP.  At the [facility], you and your health needs are our priority. We have a designated team specialized in the treatment of Heart Failure. This Care Team includes your primary Heart Failure Specialized Cardiologist (physician), Advanced Practice Providers (APPs- Physician Assistants and Nurse Practitioners), and Pharmacist who all work together to provide you with the care you need, when you need it.   You may see any of the following providers on your designated Care Team at your next follow up:  Dr. Jules Oar Dr. Peder Bourdon Dr. Alwin Baars Dr. Judyth Nunnery Shawnee Dellen, FNP Bevely Brush, RPH-CPP  Please be sure to bring in all your medications bottles to every appointment.   Need to Contact Us :  If you have any questions or concerns before your next appointment please send us  a message through [facility] or call our office at [phone].    TO LEAVE A MESSAGE FOR THE NURSE SELECT OPTION 2, PLEASE LEAVE A MESSAGE INCLUDING: YOUR NAME DATE OF BIRTH CALL BACK NUMBER REASON FOR CALL**this is important as we prioritize the call backs  YOU WILL RECEIVE A CALL BACK THE SAME DAY AS LONG AS YOU CALL BEFORE 4:00 PM

## 2024-06-11 NOTE — Progress Notes (Unsigned)
   ADVANCED [facility] FOLLOW UP CLINIC NOTE  Referring Physician: Aileen Alexanders, NP  Primary Care: Aileen Alexanders, NP Primary Cardiologist:  HPI: Collin Byrd is a 63 y.o. male with a history of DM, cirrhosis, PE, and chronic HFpEF who presents for follow up of chronic HFpEF.     Echo 06/05/20: EF of >55% along with moderate LVH.  Echo 07/17/22: EF of 65-70%.  Echo 10/06/23: EF 60-65% with trivial MR     SUBJECTIVE:  Overall doing well, no issues with swelling, chest pain, orthopnea, PND. Taking medications as prescibed, his BP is normal and well controlled. No syncope or lightheadedness, Reassurance provided for normal cardiac function  PMH, current medications, allergies, social history, and family history reviewed in epic.  PHYSICAL EXAM: Vitals:   06/07/24 1150  BP: 106/67  Pulse: 90  SpO2: 97%   GENERAL: Well nourished and in no apparent distress at rest.  PULM:  Normal work of breathing, clear to auscultation bilaterally. Respirations are unlabored.  CARDIAC:  JVP: flat         Normal rate with regular rhythm. No murmurs, rubs or gallops.  No edema. Warm and well perfused extremities. ABDOMEN: Soft, non-tender, non-distended. NEUROLOGIC: Patient is oriented x3 with no focal or lateralizing neurologic deficits.   ASSESSMENT & PLAN:  Chronic diastolic HF: Stable NYHA class I symptoms, euvolemic. No evidence of pulmonary hypertension on echo, suspect majority of volume symptoms related to known cirrhosis rather than HF. He is on good medical therapy, but would have a very low threshold to stop entresto  given hemodynamics. - Continue entresto  24/26mg  BID for now - Continue spironolactone  50mg  daily, lasix  20mg  prn, euvolemic - Continue farxiga  10mg  daily - No BB, reasonable with preserved EF. Can consider nonselective from a variceal risk standpoint if need be  Diabetes:  - Continue metformin   1000mg  BID, farxiga  - Well controlled  Cirrhosis: EtOH  related, has since quit. No recent decompensation episodes. - Continue follow up and screening with GI - Spiro as above  Follow up in 6 months  Arta Lark, MD Advanced Heart Failure Mechanical Circulatory Support 06/11/24

## 2024-06-15 ENCOUNTER — Other Ambulatory Visit: Payer: Self-pay

## 2024-07-04 ENCOUNTER — Other Ambulatory Visit: Payer: Self-pay | Admitting: Nurse Practitioner

## 2024-07-07 NOTE — Telephone Encounter (Signed)
 Too soon for refill, refilled 04/20/24 for 90 and 1 RF.  Requested Prescriptions  Pending Prescriptions Disp Refills   omeprazole  (PRILOSEC) 20 MG capsule [Pharmacy Med Name: OMEPRAZOLE  20MG  CAPSULES] 90 capsule 1    Sig: TAKE 1 CAPSULE(20 MG) BY MOUTH DAILY     Gastroenterology: Proton Pump Inhibitors Passed - 07/07/2024  9:14 AM      Passed - Valid encounter within last 12 months    Recent Outpatient Visits           2 months ago Alcoholic cirrhosis of liver without ascites [facility])   [place] [facility] Melvin Pao, NP

## 2024-07-19 ENCOUNTER — Other Ambulatory Visit: Payer: Self-pay

## 2024-08-03 ENCOUNTER — Telehealth: Payer: Self-pay | Admitting: Nurse Practitioner

## 2024-08-03 NOTE — Telephone Encounter (Unsigned)
 Copied from CRM #[id number]. Topic: Clinical - Medication Refill >> Aug 03, 2024 11:16 AM Lavanda D wrote: Medication: omeprazole  (PRILOSEC) 20 MG capsule  Has the patient contacted their pharmacy? Yes (Agent: If no, request that the patient contact the pharmacy for the refill. If patient does not wish to contact the pharmacy document the reason why and proceed with request.) (Agent: If yes, when and what did the pharmacy advise?)  This is the patient's preferred pharmacy:  [id number],  - [address] AT [facility] [address] [place] [address] Phone: [phone] Fax: [phone]  Is this the correct pharmacy for this prescription? Yes If no, delete pharmacy and type the correct one.   Has the prescription been filled recently? No  Is the patient out of the medication? Yes  Has the patient been seen for an appointment in the last year OR does the patient have an upcoming appointment? Yes  Can we respond through MyChart? No  Agent: Please be advised that Rx refills may take up to 3 business days. We ask that you follow-up with your pharmacy.

## 2024-08-05 MED ORDER — OMEPRAZOLE 20 MG PO CPDR: 20.0000 mg | DELAYED_RELEASE_CAPSULE | Freq: Every day | ORAL | 1 refills | Status: DC

## 2024-08-05 NOTE — Telephone Encounter (Signed)
 Requested Prescriptions  Pending Prescriptions Disp Refills   omeprazole  (PRILOSEC) 20 MG capsule 90 capsule 1    Sig: Take 1 capsule (20 mg total) by mouth daily.     Gastroenterology: Proton Pump Inhibitors Passed - 08/05/2024 10:41 AM      Passed - Valid encounter within last 12 months    Recent Outpatient Visits           3 months ago Alcoholic cirrhosis of liver without ascites [facility])    [facility] Melvin Pao, NP

## 2024-08-10 ENCOUNTER — Ambulatory Visit: Admitting: Anesthesiology

## 2024-08-10 ENCOUNTER — Encounter
Admission: RE | Disposition: A | Discharge status: Home / Self Care | Payer: Self-pay | Source: Home / Self Care | Attending: Gastroenterology

## 2024-08-10 ENCOUNTER — Encounter: Payer: Self-pay | Admitting: Gastroenterology

## 2024-08-10 ENCOUNTER — Ambulatory Visit
Admission: RE | Admit: 2024-08-10 | Discharge: 2024-08-10 | Disposition: A | Discharge status: Home / Self Care | Attending: Gastroenterology | Admitting: Gastroenterology

## 2024-08-10 DIAGNOSIS — Z7984 Long term (current) use of oral hypoglycemic drugs: Secondary | ICD-10-CM | POA: Diagnosis not present

## 2024-08-10 DIAGNOSIS — K649 Unspecified hemorrhoids: Secondary | ICD-10-CM | POA: Diagnosis not present

## 2024-08-10 DIAGNOSIS — D124 Benign neoplasm of descending colon: Secondary | ICD-10-CM | POA: Diagnosis not present

## 2024-08-10 DIAGNOSIS — K644 Residual hemorrhoidal skin tags: Secondary | ICD-10-CM | POA: Diagnosis not present

## 2024-08-10 DIAGNOSIS — E785 Hyperlipidemia, unspecified: Secondary | ICD-10-CM | POA: Diagnosis not present

## 2024-08-10 DIAGNOSIS — F17210 Nicotine dependence, cigarettes, uncomplicated: Secondary | ICD-10-CM | POA: Diagnosis not present

## 2024-08-10 DIAGNOSIS — K7031 Alcoholic cirrhosis of liver with ascites: Secondary | ICD-10-CM | POA: Diagnosis not present

## 2024-08-10 DIAGNOSIS — Z09 Encounter for follow-up examination after completed treatment for conditions other than malignant neoplasm: Secondary | ICD-10-CM | POA: Diagnosis not present

## 2024-08-10 DIAGNOSIS — K5730 Diverticulosis of large intestine without perforation or abscess without bleeding: Secondary | ICD-10-CM | POA: Insufficient documentation

## 2024-08-10 DIAGNOSIS — E119 Type 2 diabetes mellitus without complications: Secondary | ICD-10-CM | POA: Insufficient documentation

## 2024-08-10 DIAGNOSIS — I509 Heart failure, unspecified: Secondary | ICD-10-CM | POA: Diagnosis not present

## 2024-08-10 DIAGNOSIS — K648 Other hemorrhoids: Secondary | ICD-10-CM | POA: Insufficient documentation

## 2024-08-10 DIAGNOSIS — Z860101 Personal history of adenomatous and serrated colon polyps: Secondary | ICD-10-CM | POA: Diagnosis not present

## 2024-08-10 DIAGNOSIS — Z1211 Encounter for screening for malignant neoplasm of colon: Secondary | ICD-10-CM | POA: Diagnosis not present

## 2024-08-10 DIAGNOSIS — K635 Polyp of colon: Secondary | ICD-10-CM | POA: Diagnosis not present

## 2024-08-10 HISTORY — PX: COLONOSCOPY: SHX5424

## 2024-08-10 HISTORY — PX: POLYPECTOMY: SHX149

## 2024-08-10 LAB — GLUCOSE, CAPILLARY: Glucose-Capillary: 116 mg/dL — ABNORMAL HIGH (ref 70–99)

## 2024-08-10 SURGERY — COLONOSCOPY
Anesthesia: General

## 2024-08-10 MED ORDER — SODIUM CHLORIDE 0.9 % IV SOLN
INTRAVENOUS | Status: DC
  Administered 2024-08-10 (×2): 500 mL via INTRAVENOUS

## 2024-08-10 MED ORDER — FENTANYL CITRATE (PF) 100 MCG/2ML IJ SOLN
INTRAMUSCULAR | Status: DC | PRN
  Administered 2024-08-10 (×4): 50 ug via INTRAVENOUS

## 2024-08-10 MED ORDER — MIDAZOLAM HCL 2 MG/2ML IJ SOLN
INTRAMUSCULAR | Status: DC | PRN
Start: 2024-08-10 — End: 2024-08-10
  Administered 2024-08-10 (×2): 2 mg via INTRAVENOUS

## 2024-08-10 MED ORDER — MIDAZOLAM HCL 2 MG/2ML IJ SOLN
INTRAMUSCULAR | Status: AC
  Filled 2024-08-10: qty 2

## 2024-08-10 MED ORDER — PHENYLEPHRINE 80 MCG/ML (10ML) SYRINGE FOR IV PUSH (FOR BLOOD PRESSURE SUPPORT)
PREFILLED_SYRINGE | INTRAVENOUS | Status: DC | PRN
  Administered 2024-08-10 (×4): 120 ug via INTRAVENOUS

## 2024-08-10 MED ORDER — STERILE WATER FOR IRRIGATION IR SOLN
Status: DC | PRN
  Administered 2024-08-10 (×2): 60 mL

## 2024-08-10 MED ORDER — FENTANYL CITRATE (PF) 100 MCG/2ML IJ SOLN
INTRAMUSCULAR | Status: AC
  Filled 2024-08-10: qty 2

## 2024-08-10 MED ORDER — PROPOFOL 10 MG/ML IV BOLUS
INTRAVENOUS | Status: DC | PRN
  Administered 2024-08-10 (×2): 80 mg via INTRAVENOUS

## 2024-08-10 MED ORDER — PROPOFOL 500 MG/50ML IV EMUL
INTRAVENOUS | Status: DC | PRN
  Administered 2024-08-10 (×2): 100 ug/kg/min via INTRAVENOUS

## 2024-08-10 MED ORDER — GLYCOPYRROLATE 0.2 MG/ML IJ SOLN
INTRAMUSCULAR | Status: DC | PRN
  Administered 2024-08-10 (×2): .2 mg via INTRAVENOUS

## 2024-08-10 NOTE — Transfer of Care (Signed)
 Immediate Anesthesia Transfer of Care Note  Patient: Collin Byrd  Procedure(s) Performed: COLONOSCOPY POLYPECTOMY, INTESTINE  Patient Location: PACU  Anesthesia Type:General  Level of Consciousness: awake, alert , and oriented  Airway & Oxygen Therapy: Patient Spontanous Breathing and Patient connected to nasal cannula oxygen  Post-op Assessment: Report given to RN and Patient moving all extremities  Post vital signs: Reviewed and stable  Last Vitals:  Vitals Value Taken Time  BP 84/57 08/10/24 09:55  Temp    Pulse 81 08/10/24 09:57  Resp 15 08/10/24 09:57  SpO2 100 % 08/10/24 09:57  Vitals shown include unfiled device data.  Last Pain:  Vitals:   08/10/24 0836  TempSrc: Temporal  PainSc: 0-No pain         Complications: No notable events documented.

## 2024-08-10 NOTE — H&P (Signed)
 [name], MD [facility], DHIP [address]  [place], [place] [address]  Main: [phone] Fax:  [phone] Pager: [phone]   Primary Care Physician:  Melvin Pao, NP Primary Gastroenterologist:  Dr. [name]  Pre-Procedure History & Physical: HPI:  Collin Byrd is a 63 y.o. male is here for an colonoscopy.   Past Medical History:  Diagnosis Date   Alcohol abuse    Ascites    Ascites due to alcoholic cirrhosis (HCC)    CHF (congestive heart failure) (HCC)    Cirrhosis (HCC)    Diabetes mellitus without complication (HCC)    History of congestive heart failure 07/12/2020   History of methicillin resistant staphylococcus aureus (MRSA) 2016   Pneumonia 2021   Portal hypertension (HCC)    Pulmonary embolus (HCC)    SBP (spontaneous bacterial peritonitis) (HCC) 06/07/2020   Spontaneous bacterial peritonitis (HCC)     Past Surgical History:  Procedure Laterality Date   CATARACT EXTRACTION     COLONOSCOPY WITH PROPOFOL  N/A 01/03/2021   Procedure: COLONOSCOPY WITH PROPOFOL ;  Surgeon: Brooklyn Corinn Skiff, MD;  Location: [facility] ENDOSCOPY;  Service: Gastroenterology;  Laterality: N/A;   COLONOSCOPY WITH PROPOFOL  N/A 09/18/2021   Procedure: COLONOSCOPY WITH PROPOFOL ;  Surgeon: Brooklyn Corinn Skiff, MD;  Location: [facility] ENDOSCOPY;  Service: Gastroenterology;  Laterality: N/A;   ESOPHAGOGASTRODUODENOSCOPY (EGD) WITH PROPOFOL  N/A 01/03/2021   Procedure: ESOPHAGOGASTRODUODENOSCOPY (EGD) WITH PROPOFOL ;  Surgeon: Brooklyn Corinn Skiff, MD;  Location: [facility] ENDOSCOPY;  Service: Gastroenterology;  Laterality: N/A;   EYE SURGERY     HERNIA REPAIR     INGUINAL HERNIA REPAIR Left 04/11/2021   Procedure: HERNIA REPAIR INGUINAL ADULT, open, WITH DIAGNOSTIC LAPAROSCOPY, AND ROBOT ASSISTED;  Surgeon: Jordis Laneta FALCON, MD;  Location: [facility] ORS;  Service: General;  Laterality: Left;  provider requesting 2 hours / 120 minutes for procedure.    PARACENTESIS  2021   TESTICULAR EXPLORATION Left 04/11/2021   Procedure: TESTICULAR EXPLORATION AND REMOVAL OF LEFT TESTICLE;  Surgeon: Francisca Redell BROCKS, MD;  Location: [facility] ORS;  Service: Urology;  Laterality: Left;   UMBILICAL HERNIA REPAIR N/A 04/04/2021   Procedure: HERNIA REPAIR UMBILICAL ADULT, open;  Surgeon: Jordis Laneta FALCON, MD;  Location: [facility] ORS;  Service: General;  Laterality: N/A;   XI ROBOTIC ASSISTED INGUINAL HERNIA REPAIR WITH MESH Left 04/04/2021   Procedure: XI ROBOTIC ASSISTED INGUINAL HERNIA REPAIR WITH MESH;  Surgeon: Jordis Laneta FALCON, MD;  Location: [facility] ORS;  Service: General;  Laterality: Left;    Prior to Admission medications   Medication Sig Start Date End Date Taking? Authorizing Provider  blood glucose meter kit and supplies KIT Dispense based on patient and insurance preference. Use up to four times daily as directed. (FOR ICD-9 250.00, 250.01). 07/12/20  Yes Iloabachie, Chioma E, NP  dapagliflozin  propanediol (FARXIGA ) 10 MG TABS tablet Take 1 tablet (10 mg total) by mouth daily before breakfast. 04/12/24  Yes Hackney, Tina A, FNP  furosemide  (LASIX ) 20 MG tablet Take 1 tablet (20 mg total) by mouth daily as needed for fluid or edema. 05/16/24  Yes Hackney, Tina A, FNP  Melatonin 3 MG CAPS Take 5 mg by mouth at bedtime.   Yes [provider]  metFORMIN  (GLUCOPHAGE ) 500 MG tablet Take 2 tablets (1,000 mg total) by mouth 2 (two) times daily with a meal. 04/20/24  Yes Melvin Pao, NP  Omega-3 Fatty Acids  (FISH OIL ) 1000 MG CAPS Take 2 capsules (2,000 mg total) by mouth in the morning  and at bedtime. 06/01/24  Yes Hackney, Tina A, FNP  omeprazole  (PRILOSEC) 20 MG capsule Take 1 capsule (20 mg total) by mouth daily. 08/05/24  Yes Melvin Pao, NP  sacubitril -valsartan  (ENTRESTO ) 24-26 MG Take 1 tablet by mouth 2 (two) times daily. 03/16/24  Yes Hackney, Ellouise A, FNP  spironolactone  (ALDACTONE ) 50 MG tablet Take 1 tablet (50 mg total) by mouth daily. 11/04/23  Yes  Hackney, Ellouise A, FNP  thiamine  (VITAMIN B-1) 100 MG tablet Take 100 mg by mouth daily.   Yes [provider]    Allergies as of 07/26/2024   (No Known Allergies)    History reviewed. No pertinent family history.  Social History   Socioeconomic History   Marital status: Media planner    Spouse name: Not on file   Number of children: Not on file   Years of education: Not on file   Highest education level: Not on file  Occupational History   Not on file  Tobacco Use   Smoking status: Every Day    Current packs/day: 0.25    Types: Cigarettes   Smokeless tobacco: Never  Vaping Use   Vaping status: Never Used  Substance and Sexual Activity   Alcohol use: Not Currently   Drug use: Never   Sexual activity: Not Currently  Other Topics Concern   Not on file  Social History Narrative   No working now; used to work housekeeping; 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 Smoking- 1-3 cig/day. Lives in [place]; with wife. No children.    Social Drivers of Health   Financial Resource Strain: Patient Declined (07/25/2024)   Received from [facility]   Overall Financial Resource Strain (CARDIA)    Difficulty of Paying Living Expenses: Patient declined  Food Insecurity: Patient Declined (07/25/2024)   Received from [facility]   Hunger Vital Sign    Within the past 12 months, you worried that your food would run out before you got the money to buy more.: Patient declined    Within the past 12 months, the food you bought just didn't last and you didn't have money to get more.: Patient declined  Transportation Needs: Patient Declined (07/25/2024)   Received from [facility] - Transportation    In the past 12 months, has lack of transportation kept you from medical appointments or from getting medications?: Patient declined    Lack of Transportation (Non-Medical): Patient declined  Physical Activity: Sufficiently Active  (06/02/2024)   Exercise Vital Sign    Days of Exercise per Week: 3 days    Minutes of Exercise per Session: 60 min  Stress: No Stress Concern Present (06/02/2024)   [employer] of Occupational Health - Occupational Stress Questionnaire    Feeling of Stress : Not at all  Social Connections: Moderately Integrated (06/02/2024)   Social Connection and Isolation Panel    Frequency of Communication with Friends and Family: More than three times a week    Frequency of Social Gatherings with Friends and Family: More than three times a week    Attends Religious Services: More than 4 times per year    Active Member of [employer] or Organizations: Yes    Attends Banker Meetings: More than 4 times per year    Marital Status: Never married  Intimate Partner Violence: Not At Risk (06/02/2024)   Humiliation, Afraid, Rape, and Kick questionnaire    Fear of Current or Ex-Partner: No    Emotionally  Abused: No    Physically Abused: No    Sexually Abused: No    Review of Systems: See HPI, otherwise negative ROS  Physical Exam: BP 103/67   Pulse (!) 41   Temp (!) 96.6 F (35.9 C) (Temporal)   Resp 18   Ht 5' 7 (1.702 m)   Wt 90.3 kg   SpO2 100%   BMI 31.17 kg/m  General:   Alert,  pleasant and cooperative in NAD Head:  Normocephalic and atraumatic. Neck:  Supple; no masses or thyromegaly. Lungs:  Clear throughout to auscultation.    Heart:  Regular rate and rhythm. Abdomen:  Soft, nontender and nondistended. Normal bowel sounds, without guarding, and without rebound.   Neurologic:  Alert and  oriented x4;  grossly normal neurologically.  Impression/Plan: Collin Byrd is here for an colonoscopy to be performed for Personal history of multiple adenomatous colon polyps   Risks, benefits, limitations, and alternatives regarding  colonoscopy have been reviewed with the patient.  Questions have been answered.  All parties agreeable.   Corinn Brooklyn, MD  08/10/2024, 9:20 AM

## 2024-08-10 NOTE — Anesthesia Preprocedure Evaluation (Signed)
 Anesthesia Evaluation  Patient identified by MRN, date of birth, ID band Patient awake    Reviewed: Allergy & Precautions, NPO status , Patient's Chart, lab work & pertinent test results  History of Anesthesia Complications Negative for: history of anesthetic complications  Airway Mallampati: III  TM Distance: >3 FB Neck ROM: full    Dental  (+) Chipped   Pulmonary neg pulmonary ROS, Current Smoker   Pulmonary exam normal        Cardiovascular +CHF  Normal cardiovascular exam  Echo  IMPRESSIONS     1. Left ventricular ejection fraction, by estimation, is 60 to 65%. The  left ventricle has normal function. The left ventricle has no regional  wall motion abnormalities. Left ventricular diastolic parameters were  normal.   2. Right ventricular systolic function is normal. The right ventricular  size is normal. Tricuspid regurgitation signal is inadequate for assessing  PA pressure.   3. The mitral valve is grossly normal. Trivial mitral valve  regurgitation. No evidence of mitral stenosis.   4. The aortic valve is tricuspid. Aortic valve regurgitation is not  visualized. No aortic stenosis is present.     Neuro/Psych negative neurological ROS  negative psych ROS   GI/Hepatic negative GI ROS,,,(+) Cirrhosis         Endo/Other  negative endocrine ROSdiabetes    Renal/GU negative Renal ROS  negative genitourinary   Musculoskeletal   Abdominal   Peds  Hematology negative hematology ROS (+)   Anesthesia Other Findings Past Medical History: No date: Alcohol abuse No date: Ascites No date: Ascites due to alcoholic cirrhosis (HCC) No date: CHF (congestive heart failure) (HCC) No date: Cirrhosis (HCC) No date: Diabetes mellitus without complication (HCC) 07/12/2020: History of congestive heart failure 2016: History of methicillin resistant staphylococcus aureus (MRSA) 2021: Pneumonia No date: Portal  hypertension (HCC) No date: Pulmonary embolus (HCC) 06/07/2020: SBP (spontaneous bacterial peritonitis) (HCC) No date: Spontaneous bacterial peritonitis (HCC)  Past Surgical History: No date: CATARACT EXTRACTION 01/03/2021: COLONOSCOPY WITH PROPOFOL ; N/A     Comment:  Procedure: COLONOSCOPY WITH PROPOFOL ;  Surgeon: Unk Corinn Skiff, MD;  Location: [facility] ENDOSCOPY;  Service:               Gastroenterology;  Laterality: N/A; 09/18/2021: COLONOSCOPY WITH PROPOFOL ; N/A     Comment:  Procedure: COLONOSCOPY WITH PROPOFOL ;  Surgeon: Unk Corinn Skiff, MD;  Location: [facility] ENDOSCOPY;  Service:               Gastroenterology;  Laterality: N/A; 01/03/2021: ESOPHAGOGASTRODUODENOSCOPY (EGD) WITH PROPOFOL ; N/A     Comment:  Procedure: ESOPHAGOGASTRODUODENOSCOPY (EGD) WITH               PROPOFOL ;  Surgeon: Unk Corinn Skiff, MD;  Location:               [facility] ENDOSCOPY;  Service: Gastroenterology;  Laterality:               N/A; No date: EYE SURGERY No date: HERNIA REPAIR 04/11/2021: INGUINAL HERNIA REPAIR; Left     Comment:  Procedure: HERNIA REPAIR INGUINAL ADULT, open, WITH               DIAGNOSTIC LAPAROSCOPY, AND ROBOT ASSISTED;  Surgeon:               Jordis Laneta FALCON, MD;  Location: [facility] ORS;  Service:               General;  Laterality: Left;  provider requesting 2 hours               / 120 minutes for procedure. 2021: PARACENTESIS 04/11/2021: TESTICULAR EXPLORATION; Left     Comment:  Procedure: TESTICULAR EXPLORATION AND REMOVAL OF LEFT               TESTICLE;  Surgeon: Francisca Redell BROCKS, MD;  Location: [facility]              ORS;  Service: Urology;  Laterality: Left; 04/04/2021: UMBILICAL HERNIA REPAIR; N/A     Comment:  Procedure: HERNIA REPAIR UMBILICAL ADULT, open;                Surgeon: Jordis Laneta FALCON, MD;  Location: [facility] ORS;                Service: General;  Laterality: N/A; 04/04/2021: XI ROBOTIC ASSISTED INGUINAL HERNIA REPAIR WITH MESH; Left      Comment:  Procedure: XI ROBOTIC ASSISTED INGUINAL HERNIA REPAIR               WITH MESH;  Surgeon: Jordis Laneta FALCON, MD;  Location: [facility]               ORS;  Service: General;  Laterality: Left;  BMI    Body Mass Index: 31.17 kg/m      Reproductive/Obstetrics negative OB ROS                              Anesthesia Physical Anesthesia Plan  ASA: 3  Anesthesia Plan: General   Post-op Pain Management: Minimal or no pain anticipated   Induction: Intravenous  PONV Risk Score and Plan: 1 and Propofol  infusion and TIVA  Airway Management Planned: Natural Airway and Nasal Cannula  Additional Equipment:   Intra-op Plan:   Post-operative Plan:   Informed Consent: I have reviewed the patients History and Physical, chart, labs and discussed the procedure including the risks, benefits and alternatives for the proposed anesthesia with the patient or authorized representative who has indicated his/her understanding and acceptance.     Dental Advisory Given  Plan Discussed with: Anesthesiologist, CRNA and Surgeon  Anesthesia Plan Comments: (Patient consented for risks of anesthesia including but not limited to:  - adverse reactions to medications - risk of airway placement if required - damage to eyes, teeth, lips or other oral mucosa - nerve damage due to positioning  - sore throat or hoarseness - Damage to heart, brain, nerves, lungs, other parts of body or loss of life  Patient voiced understanding and assent.)         Anesthesia Quick Evaluation

## 2024-08-10 NOTE — Op Note (Signed)
 [facility] Patient Name: Collin Byrd Procedure Date: 08/10/2024 9:18 AM MRN: [id number] Account #: [id number] Date of Birth: Oct 30, 1961 Admit Type: Outpatient Age: 63 Room: [facility] ENDO ROOM 3 Gender: Male Note Status: Finalized Instrument Name: Colon Scope [id number] Procedure:             Colonoscopy Indications:           Surveillance: Personal history of adenomatous polyps                         on last colonoscopy 3 years ago, Last colonoscopy:                         September 2022 Providers:             Corinn Jess Brooklyn MD, MD Referring MD:          Darice Petty (Referring MD) Medicines:             General Anesthesia Complications:         No immediate complications. Estimated blood loss: None. Procedure:             Pre-Anesthesia Assessment:                        - Prior to the procedure, a History and Physical was                         performed, and patient medications and allergies were                         reviewed. The patient is competent. The risks and                         benefits of the procedure and the sedation options and                         risks were discussed with the patient. All questions                         were answered and informed consent was obtained.                         Patient identification and proposed procedure were                         verified by the physician, the nurse, the                         anesthesiologist, the anesthetist and the technician                         in the pre-procedure area in the procedure room in the                         endoscopy suite. Mental Status Examination: alert and                         oriented. Airway Examination: normal oropharyngeal  airway and neck mobility. Respiratory Examination:                         clear to auscultation. CV Examination: normal.                         Prophylactic Antibiotics: The  patient does not require                         prophylactic antibiotics. Prior Anticoagulants: The                         patient has taken no anticoagulant or antiplatelet                         agents. ASA Grade Assessment: III - A patient with                         severe systemic disease. After reviewing the risks and                         benefits, the patient was deemed in satisfactory                         condition to undergo the procedure. The anesthesia                         plan was to use general anesthesia. Immediately prior                         to administration of medications, the patient was                         re-assessed for adequacy to receive sedatives. The                         heart rate, respiratory rate, oxygen saturations,                         blood pressure, adequacy of pulmonary ventilation, and                         response to care were monitored throughout the                         procedure. The physical status of the patient was                         re-assessed after the procedure.                        After obtaining informed consent, the colonoscope was                         passed under direct vision. Throughout the procedure,                         the patient's blood pressure, pulse, and oxygen  saturations were monitored continuously. The                         Colonoscope was introduced through the anus and                         advanced to the the cecum, identified by appendiceal                         orifice and ileocecal valve. The colonoscopy was                         performed with moderate difficulty due to significant                         looping and the patient's body habitus. Successful                         completion of the procedure was aided by applying                         abdominal pressure. The patient tolerated the                         procedure well. The  quality of the bowel preparation                         was evaluated using the BBPS [facility] Bowel Preparation                         Scale) with scores of: Right Colon = 3, Transverse                         Colon = 3 and Left Colon = 3 (entire mucosa seen well                         with no residual staining, small fragments of stool or                         opaque liquid). The total BBPS score equals 9. The                         ileocecal valve, appendiceal orifice, and rectum were                         photographed. Findings:      The perianal and digital rectal examinations were normal. Pertinent       negatives include normal sphincter tone and no palpable rectal lesions.      A 5 mm polyp was found in the descending colon. The polyp was sessile.       The polyp was removed with a cold snare. Resection and retrieval were       complete. Estimated blood loss was minimal.      A few small-mouthed diverticula were found in the sigmoid colon,       ascending colon and cecum.      Non-bleeding external and internal hemorrhoids were found during  retroflexion. The hemorrhoids were large. Impression:            - One 5 mm polyp in the descending colon, removed with                         a cold snare. Resected and retrieved.                        - Diverticulosis in the sigmoid colon, in the                         ascending colon and in the cecum.                        - Non-bleeding external and internal hemorrhoids. Recommendation:        - Discharge patient to home (with escort).                        - Resume previous diet today.                        - Continue present medications.                        - Await pathology results.                        - Repeat colonoscopy in 5 years for surveillance. Procedure Code(s):     --- Professional ---                        [phone], Colonoscopy, flexible; with removal of                         tumor(s), polyp(s), or  other lesion(s) by snare                         technique Diagnosis Code(s):     --- Professional ---                        Z86.010, Personal history of colonic polyps                        D12.4, Benign neoplasm of descending colon                        K64.8, Other hemorrhoids                        K57.30, Diverticulosis of large intestine without                         perforation or abscess without bleeding CPT copyright 2022 American Medical Association. All rights reserved. The codes documented in this report are preliminary and upon coder review may  be revised to meet current compliance requirements. Dr. Corinn Brooklyn Corinn Jess Brooklyn MD, MD 08/10/2024 9:50:56 AM This report has been signed electronically. Number of Addenda: 0 Note Initiated On: 08/10/2024 9:18 AM Scope Withdrawal Time: 0 hours 11 minutes 10 seconds  Total Procedure Duration: 0 hours 15 minutes 22 seconds  Estimated  Blood Loss:  Estimated blood loss was minimal.      [facility]

## 2024-08-10 NOTE — Anesthesia Postprocedure Evaluation (Signed)
 Anesthesia Post Note  Patient: Collin Byrd  Procedure(s) Performed: COLONOSCOPY POLYPECTOMY, INTESTINE  Patient location during evaluation: Endoscopy Anesthesia Type: General Level of consciousness: awake and alert Pain management: pain level controlled Vital Signs Assessment: post-procedure vital signs reviewed and stable Respiratory status: spontaneous breathing, nonlabored ventilation, respiratory function stable and patient connected to nasal cannula oxygen Cardiovascular status: blood pressure returned to baseline and stable Postop Assessment: no apparent nausea or vomiting Anesthetic complications: no   No notable events documented.   Last Vitals:  Vitals:   08/10/24 0954 08/10/24 1007  BP: (!) 84/57 96/68  Pulse: 71   Resp: 10 15  Temp:    SpO2: 97% 98%    Last Pain:  Vitals:   08/10/24 1007  TempSrc:   PainSc: 0-No pain                 [name]

## 2024-08-11 LAB — SURGICAL PATHOLOGY

## 2024-08-15 ENCOUNTER — Ambulatory Visit: Payer: Self-pay | Admitting: Gastroenterology

## 2024-09-02 DIAGNOSIS — E119 Type 2 diabetes mellitus without complications: Secondary | ICD-10-CM | POA: Diagnosis not present

## 2024-09-06 ENCOUNTER — Other Ambulatory Visit: Payer: Self-pay | Admitting: Nurse Practitioner

## 2024-09-06 NOTE — Telephone Encounter (Unsigned)
 Copied from CRM #[id number]. Topic: Clinical - Medication Refill >> Sep 06, 2024 11:44 AM Collin Byrd wrote: Medication: metFORMIN  (GLUCOPHAGE ) 500 MG tablet  Has the patient contacted their pharmacy? Yes (Agent: If no, request that the patient contact the pharmacy for the refill. If patient does not wish to contact the pharmacy document the reason why and proceed with request.) (Agent: If yes, when and what did the pharmacy advise?)Pharmacy needs order to refill  This is the patient's preferred pharmacy:  [id number] [place] - [address] AT [facility] [address] [facility] [place] [address] Phone: [phone] Fax: [phone]  Is this the correct pharmacy for this prescription? Yes If no, delete pharmacy and type the correct one.   Has the prescription been filled recently? No  Is the patient out of the medication? No  Has the patient been seen for an appointment in the last year OR does the patient have an upcoming appointment? Yes  Can we respond through MyChart? No  Agent: Please be advised that Rx refills may take up to 3 business days. We ask that you follow-up with your pharmacy.

## 2024-09-07 MED ORDER — METFORMIN HCL 500 MG PO TABS: 1000.0000 mg | ORAL_TABLET | Freq: Two times a day (BID) | ORAL | 0 refills | Status: DC

## 2024-09-07 NOTE — Telephone Encounter (Signed)
 Requested Prescriptions  Pending Prescriptions Disp Refills   metFORMIN  (GLUCOPHAGE ) 500 MG tablet 180 tablet 0    Sig: Take 2 tablets (1,000 mg total) by mouth 2 (two) times daily with a meal.     Endocrinology:  Diabetes - Biguanides Failed - 09/07/2024  3:22 PM      Failed - B12 Level in normal range and within 720 days    No results found for: VITAMINB12       Failed - CBC within normal limits and completed in the last 12 months    WBC  Date Value Ref Range Status  04/22/2023 7.8 3.4 - 10.8 x10E3/uL Final  04/30/2021 11.3 (H) 4.0 - 10.5 K/uL Final   RBC  Date Value Ref Range Status  04/22/2023 4.24 4.14 - 5.80 x10E6/uL Final  04/30/2021 3.24 (L) 4.22 - 5.81 MIL/uL Final   Hemoglobin  Date Value Ref Range Status  04/22/2023 11.1 (L) 13.0 - 17.7 g/dL Final   Hematocrit  Date Value Ref Range Status  04/22/2023 35.7 (L) 37.5 - 51.0 % Final   MCHC  Date Value Ref Range Status  04/22/2023 31.1 (L) 31.5 - 35.7 g/dL Final  94/96/7977 67.2 30.0 - 36.0 g/dL Final   [facility]  Date Value Ref Range Status  04/22/2023 26.2 (L) 26.6 - 33.0 pg Final  04/30/2021 30.6 26.0 - 34.0 pg Final   MCV  Date Value Ref Range Status  04/22/2023 84 79 - 97 fL Final   No results found for: PLTCOUNTKUC, LABPLAT, POCPLA RDW  Date Value Ref Range Status  04/22/2023 14.7 11.6 - 15.4 % Final         Passed - Cr in normal range and within 360 days    Creatinine, Ser  Date Value Ref Range Status  05/16/2024 0.72 0.61 - 1.24 mg/dL Final         Passed - HBA1C is between 0 and 7.9 and within 180 days    Hgb A1c MFr Bld  Date Value Ref Range Status  04/20/2024 6.1 (H) 4.8 - 5.6 % Final    Comment:             Prediabetes: 5.7 - 6.4          Diabetes: >6.4          Glycemic control for adults with diabetes: <7.0          Passed - eGFR in normal range and within 360 days    GFR calc Af Amer  Date Value Ref Range Status  02/13/2021 111 >59 mL/min/1.73 Final    Comment:    **In  accordance with recommendations from the NKF-ASN Task force,**   [facility] is in the process of updating its eGFR calculation to the   2021 CKD-EPI creatinine equation that estimates kidney function   without a race variable.    GFR, Estimated  Date Value Ref Range Status  05/16/2024 >60 >60 mL/min Final    Comment:    (NOTE) Calculated using the CKD-EPI Creatinine Equation (2021)    eGFR  Date Value Ref Range Status  04/20/2024 100 >59 mL/min/1.73 Final         Passed - Valid encounter within last 6 months    Recent Outpatient Visits           4 months ago Alcoholic cirrhosis of liver without ascites [facility])   [facility] Melvin Pao, NP

## 2024-09-08 ENCOUNTER — Other Ambulatory Visit (HOSPITAL_COMMUNITY): Payer: Self-pay

## 2024-09-08 ENCOUNTER — Telehealth: Payer: Self-pay

## 2024-09-08 ENCOUNTER — Other Ambulatory Visit: Payer: Self-pay

## 2024-09-08 MED ORDER — DAPAGLIFLOZIN PROPANEDIOL 10 MG PO TABS: 10.0000 mg | ORAL_TABLET | Freq: Every day | ORAL | 1 refills | Status: AC

## 2024-09-08 NOTE — Telephone Encounter (Signed)
 Medication Samples have been provided to the patient.  Drug name: farxiga        Strength: 10mg          Qty: 4 boxes  LOT: [id number]  Exp.Date: 10/28/2026  Dosing instructions: Take 1 tab daily before breakfast  The patient has been instructed regarding the correct time, dose, and frequency of taking this medication, including desired effects and most common side effects.   Collin Byrd 1:25 PM 09/08/2024   Pt arrived to clinic requesting a coupon for farxiga . Pt states that he had a coupon in the past. Gave pt samples and will notify pharmD for further assistance.

## 2024-10-04 NOTE — Progress Notes (Signed)
 Collin Byrd                                          MRN: [id number]   10/04/2024   The VBCI Quality Team Specialist reviewed this patient medical record for the purposes of chart review for care gap closure. The following were reviewed: chart review for care gap closure-kidney health evaluation for diabetes:eGFR  and uACR.    VBCI Quality Team

## 2024-10-18 ENCOUNTER — Other Ambulatory Visit: Payer: Self-pay

## 2024-10-20 ENCOUNTER — Ambulatory Visit: Admitting: Nurse Practitioner

## 2024-10-20 ENCOUNTER — Encounter: Payer: Self-pay | Admitting: Nurse Practitioner

## 2024-10-20 VITALS — BP 104/63 | HR 65 | Temp 98.1°F | Ht 67.0 in | Wt 204.4 lb

## 2024-10-20 DIAGNOSIS — E1165 Type 2 diabetes mellitus with hyperglycemia: Secondary | ICD-10-CM | POA: Diagnosis not present

## 2024-10-20 DIAGNOSIS — E1169 Type 2 diabetes mellitus with other specified complication: Secondary | ICD-10-CM | POA: Diagnosis not present

## 2024-10-20 DIAGNOSIS — K7030 Alcoholic cirrhosis of liver without ascites: Secondary | ICD-10-CM | POA: Diagnosis not present

## 2024-10-20 DIAGNOSIS — I5032 Chronic diastolic (congestive) heart failure: Secondary | ICD-10-CM

## 2024-10-20 DIAGNOSIS — Z0000 Encounter for general adult medical examination without abnormal findings: Secondary | ICD-10-CM

## 2024-10-20 DIAGNOSIS — E785 Hyperlipidemia, unspecified: Secondary | ICD-10-CM

## 2024-10-20 DIAGNOSIS — E781 Pure hyperglyceridemia: Secondary | ICD-10-CM

## 2024-10-20 LAB — MICROALBUMIN, URINE WAIVED
Creatinine, Urine Waived: 10 mg/dL (ref 10–300)
Microalb, Ur Waived: 10 mg/L (ref 0–19)

## 2024-10-20 MED ORDER — METFORMIN HCL 500 MG PO TABS: 1000.0000 mg | ORAL_TABLET | Freq: Two times a day (BID) | ORAL | 1 refills | Status: AC

## 2024-10-20 MED ORDER — OMEPRAZOLE 20 MG PO CPDR: 20.0000 mg | DELAYED_RELEASE_CAPSULE | Freq: Every day | ORAL | 1 refills | Status: AC

## 2024-10-20 NOTE — Assessment & Plan Note (Signed)
 Chronic.  Controlled with Metformin  1000mg  BID and Farxiga .  Blood sugar today was 120.  Last A1c was 6.1% in April.  Microlabumin checked today.  Eye exam up to date.  Labs ordered today.  Return to clinic in 6 months for reevaluation.  Call sooner if concerns arise.

## 2024-10-20 NOTE — Assessment & Plan Note (Signed)
 Labs ordered at visit today.  Will make recommendations based on lab results.

## 2024-10-20 NOTE — Addendum Note (Signed)
 Addended by: MELVIN PAO on: 10/20/2024 10:51 AM   Modules accepted: Orders

## 2024-10-20 NOTE — Assessment & Plan Note (Signed)
Chronic.  Controlled.  Continue with current medication regimen.  Needs a statin. Will discuss at next visit.  Labs ordered today.  Return to clinic in 6 months for reevaluation.  Call sooner if concerns arise.

## 2024-10-20 NOTE — Assessment & Plan Note (Addendum)
-   NYHA class I - euvolemic today - weighing daily (202lb); reminded to call for an overnight weight gain of >2 pounds or a weekly weight gain of >5 pounds -Last Echo in 2024 showed EF of 60-65% with mild TR - not adding salt to his food  - entresto  24/26mg  BID - furosemide  20mg  daily - spironolactone  100mg  daily -Continue to collaborate with HF clinic. -Reviewed recent note.

## 2024-10-20 NOTE — Assessment & Plan Note (Signed)
 Chronic.  Stable.  Continue with current medication regimen.  Refrain from alcohol use.  Continue with Thiamine .  Followed by GI.  Reviewed recent note from GI.  Continues to follow up with Dr. Unk.  No acute concerns at this time.  No ascites present on exam. Continue to collaborate with specialist.  Labs ordered today.  Colonoscopy done in August 2025.  Reviewed recent note.  Return to clinic in 6 months for reevaluation.  Call sooner if concerns arise.

## 2024-10-20 NOTE — Progress Notes (Signed)
 BP 104/63   Pulse 65   Temp 98.1 F (36.7 C) (Oral)   Ht 5' 7 (1.702 m)   Wt 204 lb 6.4 oz (92.7 kg)   SpO2 100%   BMI 32.01 kg/m    Subjective:    Patient ID: Collin Byrd, male    DOB: [birth year], 63 y.o.   MRN: [id number]  HPI: Jerryl Holzhauer is a 63 y.o. male presenting on 10/20/2024 for comprehensive medical examination. Current medical complaints include:none  He currently lives with: Interim Problems from his last visit: no  DIABETES Last A1c was 6.1 in April.  Metformin  1000mg  BID. Hypoglycemic episodes:no Polydipsia/polyuria: no Visual disturbance: no Chest pain: no Paresthesias: no Glucose Monitoring: yes  Accucheck frequency: once weekly  Fasting glucose: 120  Post prandial:  Evening:  Before meals: Taking Insulin ?: no  Long acting insulin :  Short acting insulin : Blood Pressure Monitoring: daily Retinal Examination: up to date Foot Exam: Up to Date Diabetic Education: Not Completed Pneumovax: Up to Date Influenza: Up to Date Aspirin : no  HYPERTENSION / HYPERLIPIDEMIA- next appt with the HF clinic is in October.  Most Recent EF was 60-65% from October 06, 2023. Weighs himself daily-202lb  On Entrestro and farxiga  Satisfied with current treatment? no Duration of hypertension: years BP monitoring frequency: not checking BP range:  BP medication side effects: no Past BP meds: Entresto  and spironalactone, lasix  Duration of hyperlipidemia: years Cholesterol medication side effects: no Cholesterol supplements: none Past cholesterol medications: none Medication compliance: excellent compliance Aspirin : no Recent stressors: no Recurrent headaches: no Visual changes: no Palpitations: no Dyspnea: no Chest pain: no Lower extremity edema: no Dizzy/lightheaded: no  Seeing Dr. Unk for Cirrohsis.  - up to date on visits.  Taking Thiamine .  Recently had Colonoscopy in August 2025.  Depression Screen done today and results listed below:      10/20/2024   10:08 AM 06/02/2024   10:49 AM 04/20/2024   10:38 AM 10/22/2023   10:44 AM 01/23/2023    9:19 AM  Depression screen PHQ 2/9  Decreased Interest 0 0 2 0 0  Down, Depressed, Hopeless 0 0 0 0 0  PHQ - 2 Score 0 0 2 0 0  Altered sleeping 0 0 0 0 0  Tired, decreased energy 0 0 1 0 0  Change in appetite 0 0 0 0 0  Feeling bad or failure about yourself  0 0 0 0 0  Trouble concentrating 0 0 0 0 0  Moving slowly or fidgety/restless 0 0 0 0 0  Suicidal thoughts 0 0 0 0 0  PHQ-9 Score 0 0 3 0 0  Difficult doing work/chores Not difficult at all   Not difficult at all Not difficult at all    The patient does not have a history of falls. I did complete a risk assessment for falls. A plan of care for falls was documented.   Past Medical History:  Past Medical History:  Diagnosis Date   Alcohol abuse    Ascites    Ascites due to alcoholic cirrhosis (HCC)    CHF (congestive heart failure) (HCC)    Cirrhosis (HCC)    Diabetes mellitus without complication (HCC)    History of congestive heart failure 07/12/2020   History of methicillin resistant staphylococcus aureus (MRSA) 2016   Pneumonia 2021   Portal hypertension (HCC)    Pulmonary embolus (HCC)    SBP (spontaneous bacterial peritonitis) (HCC) 06/07/2020   Spontaneous bacterial peritonitis [facility])     Surgical  History:  Past Surgical History:  Procedure Laterality Date   CATARACT EXTRACTION     COLONOSCOPY N/A 08/10/2024   Procedure: COLONOSCOPY;  Surgeon: Unk Corinn Skiff, MD;  Location: [facility] ENDOSCOPY;  Service: Gastroenterology;  Laterality: N/A;  DM   COLONOSCOPY WITH PROPOFOL  N/A 01/03/2021   Procedure: COLONOSCOPY WITH PROPOFOL ;  Surgeon: Unk Corinn Skiff, MD;  Location: [facility] ENDOSCOPY;  Service: Gastroenterology;  Laterality: N/A;   COLONOSCOPY WITH PROPOFOL  N/A 09/18/2021   Procedure: COLONOSCOPY WITH PROPOFOL ;  Surgeon: Unk Corinn Skiff, MD;  Location: [facility] ENDOSCOPY;  Service: Gastroenterology;  Laterality:  N/A;   ESOPHAGOGASTRODUODENOSCOPY (EGD) WITH PROPOFOL  N/A 01/03/2021   Procedure: ESOPHAGOGASTRODUODENOSCOPY (EGD) WITH PROPOFOL ;  Surgeon: Unk Corinn Skiff, MD;  Location: [facility] ENDOSCOPY;  Service: Gastroenterology;  Laterality: N/A;   EYE SURGERY     HERNIA REPAIR     INGUINAL HERNIA REPAIR Left 04/11/2021   Procedure: HERNIA REPAIR INGUINAL ADULT, open, WITH DIAGNOSTIC LAPAROSCOPY, AND ROBOT ASSISTED;  Surgeon: Jordis Laneta FALCON, MD;  Location: [facility] ORS;  Service: General;  Laterality: Left;  provider requesting 2 hours / 120 minutes for procedure.   PARACENTESIS  2021   POLYPECTOMY  08/10/2024   Procedure: POLYPECTOMY, INTESTINE;  Surgeon: Unk Corinn Skiff, MD;  Location: [facility] ENDOSCOPY;  Service: Gastroenterology;;   TESTICULAR EXPLORATION Left 04/11/2021   Procedure: TESTICULAR EXPLORATION AND REMOVAL OF LEFT TESTICLE;  Surgeon: Francisca Redell BROCKS, MD;  Location: [facility] ORS;  Service: Urology;  Laterality: Left;   UMBILICAL HERNIA REPAIR N/A 04/04/2021   Procedure: HERNIA REPAIR UMBILICAL ADULT, open;  Surgeon: Jordis Laneta FALCON, MD;  Location: [facility] ORS;  Service: General;  Laterality: N/A;   XI ROBOTIC ASSISTED INGUINAL HERNIA REPAIR WITH MESH Left 04/04/2021   Procedure: XI ROBOTIC ASSISTED INGUINAL HERNIA REPAIR WITH MESH;  Surgeon: Jordis Laneta FALCON, MD;  Location: [facility] ORS;  Service: General;  Laterality: Left;    Medications:  Current Outpatient Medications on File Prior to Visit  Medication Sig   blood glucose meter kit and supplies KIT Dispense based on patient and insurance preference. Use up to four times daily as directed. (FOR ICD-9 250.00, 250.01).   dapagliflozin  propanediol (FARXIGA ) 10 MG TABS tablet Take 1 tablet (10 mg total) by mouth daily before breakfast.   furosemide  (LASIX ) 20 MG tablet Take 1 tablet (20 mg total) by mouth daily as needed for fluid or edema.   melatonin 5 MG TABS Take 5 mg by mouth.   metFORMIN  (GLUCOPHAGE ) 500 MG tablet Take 2 tablets (1,000 mg total)  by mouth 2 (two) times daily with a meal.   Omega-3 Fatty Acids  (FISH OIL ) 1000 MG CAPS Take 2 capsules (2,000 mg total) by mouth in the morning and at bedtime.   omeprazole  (PRILOSEC) 20 MG capsule Take 1 capsule (20 mg total) by mouth daily.   sacubitril -valsartan  (ENTRESTO ) 24-26 MG Take 1 tablet by mouth 2 (two) times daily.   spironolactone  (ALDACTONE ) 50 MG tablet Take 1 tablet (50 mg total) by mouth daily.   thiamine  (VITAMIN B-1) 100 MG tablet Take 100 mg by mouth daily.   No current facility-administered medications on file prior to visit.    Allergies:  No Known Allergies  Social History:  Social History   Socioeconomic History   Marital status: Media planner    Spouse name: Not on file   Number of children: Not on file   Years of education: Not on file   Highest education level: Not on file  Occupational History   Not on  file  Tobacco Use   Smoking status: Every Day    Current packs/day: 0.25    Types: Cigarettes   Smokeless tobacco: Never  Vaping Use   Vaping status: Never Used  Substance and Sexual Activity   Alcohol use: Not Currently   Drug use: Never   Sexual activity: Not Currently  Other Topics Concern   Not on file  Social History Narrative   No working now; used to work housekeeping; 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 Smoking- 1-3 cig/day. Lives in [place]; with wife. No children.    Social Drivers of Health   Financial Resource Strain: Patient Declined (07/25/2024)   Received from [facility]   Overall Financial Resource Strain (CARDIA)    Difficulty of Paying Living Expenses: Patient declined  Food Insecurity: Patient Declined (07/25/2024)   Received from [facility]   Hunger Vital Sign    Within the past 12 months, you worried that your food would run out before you got the money to buy more.: Patient declined    Within the past 12 months, the food you bought just didn't last and you didn't have money to get  more.: Patient declined  Transportation Needs: Patient Declined (07/25/2024)   Received from [facility] - Transportation    In the past 12 months, has lack of transportation kept you from medical appointments or from getting medications?: Patient declined    Lack of Transportation (Non-Medical): Patient declined  Physical Activity: Sufficiently Active (06/02/2024)   Exercise Vital Sign    Days of Exercise per Week: 3 days    Minutes of Exercise per Session: 60 min  Stress: No Stress Concern Present (06/02/2024)   [employer] of Occupational Health - Occupational Stress Questionnaire    Feeling of Stress : Not at all  Social Connections: Moderately Integrated (06/02/2024)   Social Connection and Isolation Panel    Frequency of Communication with Friends and Family: More than three times a week    Frequency of Social Gatherings with Friends and Family: More than three times a week    Attends Religious Services: More than 4 times per year    Active Member of [employer] or Organizations: Yes    Attends Banker Meetings: More than 4 times per year    Marital Status: Never married  Intimate Partner Violence: Not At Risk (06/02/2024)   Humiliation, Afraid, Rape, and Kick questionnaire    Fear of Current or Ex-Partner: No    Emotionally Abused: No    Physically Abused: No    Sexually Abused: No   Social History   Tobacco Use  Smoking Status Every Day   Current packs/day: 0.25   Types: Cigarettes  Smokeless Tobacco Never   Social History   Substance and Sexual Activity  Alcohol Use Not Currently    Family History:  History reviewed. No pertinent family history.  Past medical history, surgical history, medications, allergies, family history and social history reviewed with patient today and changes made to appropriate areas of the chart.   Review of Systems  HENT:         Denies vision changes.  Eyes:  Negative for blurred vision and double  vision.  Respiratory:  Negative for shortness of breath.   Cardiovascular:  Negative for chest pain, palpitations and leg swelling.  Neurological:  Negative for dizziness, tingling and headaches.  Endo/Heme/Allergies:  Negative for polydipsia.       Denies Polyuria  All other ROS negative except what is listed above and in the HPI.      Objective:    BP 104/63   Pulse 65   Temp 98.1 F (36.7 C) (Oral)   Ht 5' 7 (1.702 m)   Wt 204 lb 6.4 oz (92.7 kg)   SpO2 100%   BMI 32.01 kg/m   Wt Readings from Last 3 Encounters:  10/20/24 204 lb 6.4 oz (92.7 kg)  08/10/24 199 lb (90.3 kg)  06/07/24 207 lb 2 oz (94 kg)    Physical Exam Vitals and nursing note reviewed.  Constitutional:      General: He is not in acute distress.    Appearance: Normal appearance. He is not ill-appearing, toxic-appearing or diaphoretic.  HENT:     Head: Normocephalic.     Right Ear: Tympanic membrane, ear canal and external ear normal.     Left Ear: Tympanic membrane, ear canal and external ear normal.     Nose: Nose normal. No congestion or rhinorrhea.     Mouth/Throat:     Mouth: Mucous membranes are moist.  Eyes:     General:        Right eye: No discharge.        Left eye: No discharge.     Extraocular Movements: Extraocular movements intact.     Conjunctiva/sclera: Conjunctivae normal.     Pupils: Pupils are equal, round, and reactive to light.  Cardiovascular:     Rate and Rhythm: Normal rate and regular rhythm.     Heart sounds: No murmur heard. Pulmonary:     Effort: Pulmonary effort is normal. No respiratory distress.     Breath sounds: Normal breath sounds. No wheezing, rhonchi or rales.  Abdominal:     General: Abdomen is flat. Bowel sounds are normal. There is no distension.     Palpations: Abdomen is soft.     Tenderness: There is no abdominal tenderness. There is no guarding.  Musculoskeletal:     Cervical back: Normal range of motion and neck supple.  Skin:    General: Skin is  warm and dry.     Capillary Refill: Capillary refill takes less than 2 seconds.  Neurological:     General: No focal deficit present.     Mental Status: He is alert and oriented to person, place, and time.     Cranial Nerves: No cranial nerve deficit.     Motor: No weakness.     Deep Tendon Reflexes: Reflexes normal.  Psychiatric:        Mood and Affect: Mood normal.        Behavior: Behavior normal.        Thought Content: Thought content normal.        Judgment: Judgment normal.     Results for orders placed or performed during the hospital encounter of 08/10/24  Surgical pathology   Collection Time: 08/10/24 12:00 AM  Result Value Ref Range   SURGICAL PATHOLOGY      SURGICAL PATHOLOGY [facility] [address] [place], [place] [address] Telephone [phone] or [phone] Fax [phone]  REPORT OF SURGICAL PATHOLOGY   Accession #: [id number] Patient Name: Hon, Rowin Visit # : [id number]  MRN: [id number] Physician: Unk Cotton DOB/Age [birth year] (Age: 79) Gender: M Collected Date: 08/10/2024 Received Date: 08/10/2024  FINAL DIAGNOSIS       1. Descending Colon Polyp, cold snare :       - TUBULAR ADENOMA      -  NEGATIVE FOR HIGH-GRADE DYSPLASIA OR MALIGNANCY       DATE SIGNED OUT: 08/11/2024 ELECTRONIC SIGNATURE : Rebbecca Md, Nilesh, Pathologist, Electronic Signature  MICROSCOPIC DESCRIPTION  CASE COMMENTS STAINS USED IN DIAGNOSIS: H&E    CLINICAL HISTORY  SPECIMEN(S) OBTAINED 1. Descending Colon Polyp, Cold Snare  SPECIMEN COMMENTS: SPECIMEN CLINICAL INFORMATION: 1. Colon polyp, diverticulosis, hemorrhoids    Gross Description 1. Received in formalin ar e tan, soft tissue fragments that are submitted in toto.  Number:  three,  Size:  1.1 cm, 1 block. mb 08-10-24        Report signed out from the following location(s) [place]. [facility] [address] [place], [place] [address]  CLIA #: [id number]  [facility] [address] [place], [place] [address] CLIA #: [id number]   Glucose, capillary   Collection Time: 08/10/24  8:53 AM  Result Value Ref Range   Glucose-Capillary 116 (H) 70 - 99 mg/dL      Assessment & Plan:   Problem List Items Addressed This Visit       Cardiovascular and Mediastinum   Chronic diastolic heart failure (HCC)   - NYHA class I - euvolemic today - weighing daily (202lb); reminded to call for an overnight weight gain of >2 pounds or a weekly weight gain of >5 pounds -Last Echo in 2024 showed EF of 60-65% with mild TR - not adding salt to his food  - entresto  24/26mg  BID - furosemide  20mg  daily - spironolactone  100mg  daily -Continue to collaborate with HF clinic. -Reviewed recent note.        Digestive   Hepatic cirrhosis (HCC)   Chronic.  Stable.  Continue with current medication regimen.  Refrain from alcohol use.  Continue with Thiamine .  Followed by GI.  Reviewed recent note from GI.  Continues to follow up with Dr. Unk.  No acute concerns at this time.  No ascites present on exam. Continue to collaborate with specialist.  Labs ordered today.  Colonoscopy done in August 2025.  Reviewed recent note.  Return to clinic in 6 months for reevaluation.  Call sooner if concerns arise.          Endocrine   Type 2 diabetes mellitus with hyperglycemia, without long-term current use of insulin  (HCC)   Chronic.  Controlled with Metformin  1000mg  BID and Farxiga .  Blood sugar today was 120.  Last A1c was 6.1% in April.  Microlabumin checked today.  Eye exam up to date.  Labs ordered today.  Return to clinic in 6 months for reevaluation.  Call sooner if concerns arise.       Relevant Orders   Microalbumin, Urine Waived   HgB A1c   Hyperlipidemia associated with type 2 diabetes mellitus (HCC)   Chronic.  Controlled.  Continue with current medication regimen.  Needs a statin. Will discuss at next visit.  Labs ordered  today.  Return to clinic in 6 months for reevaluation.  Call sooner if concerns arise.       Relevant Orders   Lipid panel     Other   Hypertriglyceridemia   Labs ordered at visit today.  Will make recommendations based on lab results.       Other Visit Diagnoses       Annual physical exam    -  Primary   Health maintenance reviewed during visit today.  Labs ordered.  Vaccines reviewed.  Colonoscopy up to date.   Relevant Orders   TSH   PSA  Lipid panel   CBC with Differential/Platelet        Discussed aspirin  prophylaxis for myocardial infarction prevention and decision was it was not indicated  LABORATORY TESTING:  Health maintenance labs ordered today as discussed above.     IMMUNIZATIONS:   - Tdap: Tetanus vaccination status reviewed: Refused. - Influenza: Refused - Pneumovax: Refused - Prevnar: Refused - COVID: Refused - HPV: Not applicable - Shingrix vaccine: Refused  SCREENING: - Colonoscopy: Up to date  Discussed with patient purpose of the colonoscopy is to detect colon cancer at curable precancerous or early stages   - AAA Screening: Not applicable  -Hearing Test: Not applicable  -Spirometry: Not applicable   PATIENT COUNSELING:    Sexuality: Discussed sexually transmitted diseases, partner selection, use of condoms, avoidance of unintended pregnancy  and contraceptive alternatives.   Advised to avoid cigarette smoking.  I discussed with the patient that most people either abstain from alcohol or drink within safe limits (<=14/week and <=4 drinks/occasion for males, <=7/weeks and <= 3 drinks/occasion for females) and that the risk for alcohol disorders and other health effects rises proportionally with the number of drinks per week and how often a drinker exceeds daily limits.  Discussed cessation/primary prevention of drug use and availability of treatment for abuse.   Diet: Encouraged to adjust caloric intake to maintain  or achieve ideal body  weight, to reduce intake of dietary saturated fat and total fat, to limit sodium intake by avoiding high sodium foods and not adding table salt, and to maintain adequate dietary potassium and calcium  preferably from fresh fruits, vegetables, and low-fat dairy products.    stressed the importance of regular exercise  Injury prevention: Discussed safety belts, safety helmets, smoke detector, smoking near bedding or upholstery.   Dental health: Discussed importance of regular tooth brushing, flossing, and dental visits.   Follow up plan: NEXT PREVENTATIVE PHYSICAL DUE IN 1 YEAR. Return in about 6 months (around 04/20/2025) for HTN, HLD, DM2 FU.

## 2024-10-21 ENCOUNTER — Ambulatory Visit: Payer: Self-pay | Admitting: Nurse Practitioner

## 2024-10-21 DIAGNOSIS — D649 Anemia, unspecified: Secondary | ICD-10-CM

## 2024-10-21 LAB — LIPID PANEL
Chol/HDL Ratio: 3.1 ratio (ref 0.0–5.0)
Cholesterol, Total: 151 mg/dL (ref 100–199)
HDL: 48 mg/dL
LDL Chol Calc (NIH): 87 mg/dL (ref 0–99)
Triglycerides: 85 mg/dL (ref 0–149)
VLDL Cholesterol Cal: 16 mg/dL (ref 5–40)

## 2024-10-21 LAB — CBC WITH DIFFERENTIAL/PLATELET
Basophils Absolute: 0 10*3/uL (ref 0.0–0.2)
Basos: 1 %
EOS (ABSOLUTE): 0.1 10*3/uL (ref 0.0–0.4)
Eos: 1 %
Hematocrit: 34.1 % — ABNORMAL LOW (ref 37.5–51.0)
Hemoglobin: 9.8 g/dL — ABNORMAL LOW (ref 13.0–17.7)
Immature Grans (Abs): 0 10*3/uL (ref 0.0–0.1)
Immature Granulocytes: 0 %
Lymphocytes Absolute: 1 10*3/uL (ref 0.7–3.1)
Lymphs: 17 %
MCH: 22.6 pg — ABNORMAL LOW (ref 26.6–33.0)
MCHC: 28.7 g/dL — ABNORMAL LOW (ref 31.5–35.7)
MCV: 79 fL (ref 79–97)
Monocytes Absolute: 0.5 10*3/uL (ref 0.1–0.9)
Monocytes: 8 %
Neutrophils Absolute: 4.4 10*3/uL (ref 1.4–7.0)
Neutrophils: 72 %
Platelets: 265 10*3/uL (ref 150–450)
RBC: 4.34 x10E6/uL (ref 4.14–5.80)
RDW: 17 % — ABNORMAL HIGH (ref 11.6–15.4)
WBC: 6.1 10*3/uL (ref 3.4–10.8)

## 2024-10-21 LAB — PSA: Prostate Specific Ag, Serum: 1.6 ng/mL (ref 0.0–4.0)

## 2024-10-21 LAB — HEMOGLOBIN A1C
Est. average glucose Bld gHb Est-mCnc: 126 mg/dL
Hgb A1c MFr Bld: 6 % — ABNORMAL HIGH (ref 4.8–5.6)

## 2024-10-21 LAB — TSH: TSH: 2.58 u[IU]/mL (ref 0.450–4.500)

## 2024-10-25 ENCOUNTER — Other Ambulatory Visit

## 2024-10-25 DIAGNOSIS — D649 Anemia, unspecified: Secondary | ICD-10-CM

## 2024-10-26 ENCOUNTER — Ambulatory Visit: Payer: Self-pay | Admitting: Nurse Practitioner

## 2024-10-26 DIAGNOSIS — D508 Other iron deficiency anemias: Secondary | ICD-10-CM

## 2024-10-26 LAB — ANEMIA PROFILE B
Basophils Absolute: 0 x10E3/uL (ref 0.0–0.2)
Basos: 1 %
EOS (ABSOLUTE): 0.1 x10E3/uL (ref 0.0–0.4)
Eos: 1 %
Ferritin: 7 ng/mL — ABNORMAL LOW (ref 30–400)
Folate: 9 ng/mL (ref >3.0)
Hematocrit: 33.7 % — ABNORMAL LOW (ref 37.5–51.0)
Hemoglobin: 9.7 g/dL — ABNORMAL LOW (ref 13.0–17.7)
Immature Grans (Abs): 0 x10E3/uL (ref 0.0–0.1)
Immature Granulocytes: 0 %
Iron Saturation: 5 % — CL (ref 15–55)
Iron: 23 ug/dL — ABNORMAL LOW (ref 38–169)
Lymphocytes Absolute: 1.2 x10E3/uL (ref 0.7–3.1)
Lymphs: 20 %
MCH: 22.5 pg — ABNORMAL LOW (ref 26.6–33.0)
MCHC: 28.8 g/dL — ABNORMAL LOW (ref 31.5–35.7)
MCV: 78 fL — ABNORMAL LOW (ref 79–97)
Monocytes Absolute: 0.6 x10E3/uL (ref 0.1–0.9)
Monocytes: 10 %
Neutrophils Absolute: 4.3 x10E3/uL (ref 1.4–7.0)
Neutrophils: 67 %
Platelets: 264 x10E3/uL (ref 150–450)
RBC: 4.32 x10E6/uL (ref 4.14–5.80)
RDW: 16.6 % — ABNORMAL HIGH (ref 11.6–15.4)
Retic Ct Pct: 1.5 % (ref 0.6–2.6)
Total Iron Binding Capacity: 441 ug/dL (ref 250–450)
UIBC: 418 ug/dL — ABNORMAL HIGH (ref 111–343)
Vitamin B-12: 348 pg/mL (ref 232–1245)
WBC: 6.3 x10E3/uL (ref 3.4–10.8)

## 2024-11-21 ENCOUNTER — Other Ambulatory Visit: Payer: Self-pay | Admitting: Gerontology

## 2024-11-21 ENCOUNTER — Other Ambulatory Visit: Payer: Self-pay

## 2024-11-21 ENCOUNTER — Telehealth: Payer: Self-pay | Admitting: Family

## 2024-11-21 ENCOUNTER — Other Ambulatory Visit: Payer: Self-pay | Admitting: Nurse Practitioner

## 2024-11-21 NOTE — Telephone Encounter (Unsigned)
 Copied from CRM #[id number]. Topic: Clinical - Medication Refill >> Nov 21, 2024 11:07 AM Hadassah PARAS wrote: Medication: spironolactone  (ALDACTONE ) 50 MG tablet   Has the patient contacted their pharmacy? Yes (Agent: If no, request that the patient contact the pharmacy for the refill. If patient does not wish to contact the pharmacy document the reason why and proceed with request.) (Agent: If yes, when and what did the pharmacy advise?)  This is the patient's preferred pharmacy:  [id number] [place] - [address] AT [facility] OF [address] [place] [address] Phone: [phone] Fax: [phone]  [facility] [address] [place] - [address] [place] [address] Phone: [phone] Fax: [phone]    Is this the correct pharmacy for this prescription? Yes If no, delete pharmacy and type the correct one.   Has the prescription been filled recently? No  Is the patient out of the medication? Yes  Has the patient been seen for an appointment in the last year OR does the patient have an upcoming appointment? Yes  Can we respond through MyChart? No  Agent: Please be advised that Rx refills may take up to 3 business days. We ask that you follow-up with your pharmacy.

## 2024-11-21 NOTE — Telephone Encounter (Signed)
 Called pt to make aware that he has refills of furosemide  at [facility] in [place]. He needs to reach out to them for the refill. Pt didn't answer. Voicemail full.

## 2024-11-22 ENCOUNTER — Other Ambulatory Visit: Payer: Self-pay | Admitting: Family

## 2024-11-22 ENCOUNTER — Other Ambulatory Visit: Payer: Self-pay | Admitting: Gerontology

## 2024-11-22 ENCOUNTER — Other Ambulatory Visit: Payer: Self-pay

## 2024-11-22 NOTE — Telephone Encounter (Signed)
 Requested medications are due for refill today.  yes  Requested medications are on the active medications list.  yes  Last refill. 11/04/2023 #90 3 rf  Future visit scheduled.   yes  Notes to clinic.  Rx signed by another provider.    Requested Prescriptions  Pending Prescriptions Disp Refills   spironolactone  (ALDACTONE ) 50 MG tablet 90 tablet 3    Sig: Take 1 tablet (50 mg total) by mouth daily.     Cardiovascular: Diuretics - Aldosterone Antagonist Failed - 11/22/2024 12:37 PM      Failed - Cr in normal range and within 180 days    Creatinine, Ser  Date Value Ref Range Status  05/16/2024 0.72 0.61 - 1.24 mg/dL Final         Failed - K in normal range and within 180 days    Potassium  Date Value Ref Range Status  05/16/2024 3.5 3.5 - 5.1 mmol/L Final         Failed - Na in normal range and within 180 days    Sodium  Date Value Ref Range Status  05/16/2024 138 135 - 145 mmol/L Final  04/20/2024 138 134 - 144 mmol/L Final         Failed - eGFR is 30 or above and within 180 days    GFR calc Af Amer  Date Value Ref Range Status  02/13/2021 111 >59 mL/min/1.73 Final    Comment:    **In accordance with recommendations from the NKF-ASN Task force,**   [facility] is in the process of updating its eGFR calculation to the   2021 CKD-EPI creatinine equation that estimates kidney function   without a race variable.    GFR, Estimated  Date Value Ref Range Status  05/16/2024 >60 >60 mL/min Final    Comment:    (NOTE) Calculated using the CKD-EPI Creatinine Equation (2021)    eGFR  Date Value Ref Range Status  04/20/2024 100 >59 mL/min/1.73 Final         Passed - Last BP in normal range    BP Readings from Last 1 Encounters:  10/20/24 104/63         Passed - Valid encounter within last 6 months    Recent Outpatient Visits           1 month ago Annual physical exam   [place] [facility] Melvin Pao, NP   7 months ago Alcoholic cirrhosis of  liver without ascites [facility])   [place] [facility] Melvin Pao, NP

## 2024-11-23 ENCOUNTER — Other Ambulatory Visit: Payer: Self-pay

## 2024-11-25 ENCOUNTER — Other Ambulatory Visit: Payer: Self-pay

## 2025-01-06 ENCOUNTER — Other Ambulatory Visit: Payer: Self-pay

## 2025-01-30 ENCOUNTER — Other Ambulatory Visit: Payer: Self-pay | Admitting: Nurse Practitioner

## 2025-01-31 ENCOUNTER — Other Ambulatory Visit: Payer: Self-pay

## 2025-02-02 ENCOUNTER — Other Ambulatory Visit: Payer: Self-pay

## 2025-02-03 ENCOUNTER — Other Ambulatory Visit: Payer: Self-pay | Admitting: Nurse Practitioner

## 2025-02-03 NOTE — Telephone Encounter (Addendum)
 [facility] called and spoke to Ayana, Rx tech about the refill(s) metformin  and omeprazole  requested. Advised it was sent on 10/20/24 #180/1 refill(s). She states that omeprazole  ready for p/u, metformin  will be filled and ready for p/u today. Will call and notify pt.   Called pt, LVM about rxs  Requested Prescriptions  Pending Prescriptions Disp Refills   omeprazole  (PRILOSEC) 20 MG capsule 90 capsule 1    Sig: Take 1 capsule (20 mg total) by mouth daily.     Gastroenterology: Proton Pump Inhibitors Passed - 02/03/2025 12:00 PM      Passed - Valid encounter within last 12 months    Recent Outpatient Visits           3 months ago Annual physical exam   [place] [facility] Melvin Pao, NP   9 months ago Alcoholic cirrhosis of liver without ascites (HCC)   [place] [facility] Melvin Pao, NP               metFORMIN  (GLUCOPHAGE ) 500 MG tablet 180 tablet 1    Sig: Take 2 tablets (1,000 mg total) by mouth 2 (two) times daily with a meal.     Endocrinology:  Diabetes - Biguanides Passed - 02/03/2025 12:00 PM      Passed - Cr in normal range and within 360 days    Creatinine, Ser  Date Value Ref Range Status  05/16/2024 0.72 0.61 - 1.24 mg/dL Final         Passed - HBA1C is between 0 and 7.9 and within 180 days    Hgb A1c MFr Bld  Date Value Ref Range Status  10/20/2024 6.0 (H) 4.8 - 5.6 % Final    Comment:             Prediabetes: 5.7 - 6.4          Diabetes: >6.4          Glycemic control for adults with diabetes: <7.0          Passed - eGFR in normal range and within 360 days    GFR calc Af Amer  Date Value Ref Range Status  02/13/2021 111 >59 mL/min/1.73 Final    Comment:    **In accordance with recommendations from the NKF-ASN Task force,**   [facility] is in the process of updating its eGFR calculation to the   2021 CKD-EPI creatinine equation that estimates kidney function   without a race variable.    GFR,  Estimated  Date Value Ref Range Status  05/16/2024 >60 >60 mL/min Final    Comment:    (NOTE) Calculated using the CKD-EPI Creatinine Equation (2021)    eGFR  Date Value Ref Range Status  04/20/2024 100 >59 mL/min/1.73 Final         Passed - B12 Level in normal range and within 720 days    Vitamin B-12  Date Value Ref Range Status  10/25/2024 348 232 - 1,245 pg/mL Final         Passed - Valid encounter within last 6 months    Recent Outpatient Visits           3 months ago Annual physical exam   [place] [facility] Melvin Pao, NP   9 months ago Alcoholic cirrhosis of liver without ascites [facility])   [place] [facility] Melvin Pao, NP              Passed - CBC within normal  limits and completed in the last 12 months    WBC  Date Value Ref Range Status  10/25/2024 6.3 3.4 - 10.8 x10E3/uL Final  04/30/2021 11.3 (H) 4.0 - 10.5 K/uL Final   RBC  Date Value Ref Range Status  10/25/2024 4.32 4.14 - 5.80 x10E6/uL Final  04/30/2021 3.24 (L) 4.22 - 5.81 MIL/uL Final   Hemoglobin  Date Value Ref Range Status  10/25/2024 9.7 (L) 13.0 - 17.7 g/dL Final   Hematocrit  Date Value Ref Range Status  10/25/2024 33.7 (L) 37.5 - 51.0 % Final   MCHC  Date Value Ref Range Status  10/25/2024 28.8 (L) 31.5 - 35.7 g/dL Final  94/96/7977 67.2 30.0 - 36.0 g/dL Final   [facility]  Date Value Ref Range Status  10/25/2024 22.5 (L) 26.6 - 33.0 pg Final  04/30/2021 30.6 26.0 - 34.0 pg Final   MCV  Date Value Ref Range Status  10/25/2024 78 (L) 79 - 97 fL Final   No results found for: PLTCOUNTKUC, LABPLAT, POCPLA RDW  Date Value Ref Range Status  10/25/2024 16.6 (H) 11.6 - 15.4 % Final         Refused Prescriptions Disp Refills   metFORMIN  (GLUCOPHAGE ) 500 MG tablet 180 tablet 1    Sig: Take 2 tablets (1,000 mg total) by mouth 2 (two) times daily with a meal.     Endocrinology:  Diabetes - Biguanides Passed - 02/03/2025 12:00 PM       Passed - Cr in normal range and within 360 days    Creatinine, Ser  Date Value Ref Range Status  05/16/2024 0.72 0.61 - 1.24 mg/dL Final         Passed - HBA1C is between 0 and 7.9 and within 180 days    Hgb A1c MFr Bld  Date Value Ref Range Status  10/20/2024 6.0 (H) 4.8 - 5.6 % Final    Comment:             Prediabetes: 5.7 - 6.4          Diabetes: >6.4          Glycemic control for adults with diabetes: <7.0          Passed - eGFR in normal range and within 360 days    GFR calc Af Amer  Date Value Ref Range Status  02/13/2021 111 >59 mL/min/1.73 Final    Comment:    **In accordance with recommendations from the NKF-ASN Task force,**   [facility] is in the process of updating its eGFR calculation to the   2021 CKD-EPI creatinine equation that estimates kidney function   without a race variable.    GFR, Estimated  Date Value Ref Range Status  05/16/2024 >60 >60 mL/min Final    Comment:    (NOTE) Calculated using the CKD-EPI Creatinine Equation (2021)    eGFR  Date Value Ref Range Status  04/20/2024 100 >59 mL/min/1.73 Final         Passed - B12 Level in normal range and within 720 days    Vitamin B-12  Date Value Ref Range Status  10/25/2024 348 232 - 1,245 pg/mL Final         Passed - Valid encounter within last 6 months    Recent Outpatient Visits           3 months ago Annual physical exam   [place] [facility] Melvin Pao, NP   9 months ago Alcoholic cirrhosis of liver without ascites (  HCC)   [place] [facility] Melvin Pao, NP              Passed - CBC within normal limits and completed in the last 12 months    WBC  Date Value Ref Range Status  10/25/2024 6.3 3.4 - 10.8 x10E3/uL Final  04/30/2021 11.3 (H) 4.0 - 10.5 K/uL Final   RBC  Date Value Ref Range Status  10/25/2024 4.32 4.14 - 5.80 x10E6/uL Final  04/30/2021 3.24 (L) 4.22 - 5.81 MIL/uL Final   Hemoglobin  Date Value Ref Range Status   10/25/2024 9.7 (L) 13.0 - 17.7 g/dL Final   Hematocrit  Date Value Ref Range Status  10/25/2024 33.7 (L) 37.5 - 51.0 % Final   MCHC  Date Value Ref Range Status  10/25/2024 28.8 (L) 31.5 - 35.7 g/dL Final  94/96/7977 67.2 30.0 - 36.0 g/dL Final   [facility]  Date Value Ref Range Status  10/25/2024 22.5 (L) 26.6 - 33.0 pg Final  04/30/2021 30.6 26.0 - 34.0 pg Final   MCV  Date Value Ref Range Status  10/25/2024 78 (L) 79 - 97 fL Final   No results found for: PLTCOUNTKUC, LABPLAT, POCPLA RDW  Date Value Ref Range Status  10/25/2024 16.6 (H) 11.6 - 15.4 % Final          omeprazole  (PRILOSEC) 20 MG capsule 90 capsule 1    Sig: Take 1 capsule (20 mg total) by mouth daily.     Gastroenterology: Proton Pump Inhibitors Passed - 02/03/2025 12:00 PM      Passed - Valid encounter within last 12 months    Recent Outpatient Visits           3 months ago Annual physical exam   [place] [facility] Melvin Pao, NP   9 months ago Alcoholic cirrhosis of liver without ascites [facility])    [facility] Melvin Pao, NP

## 2025-02-03 NOTE — Telephone Encounter (Unsigned)
 Copied from CRM #[id number]. Topic: Clinical - Medication Question >> Feb 03, 2025 10:36 AM Treva T wrote: Reason for CRM: Pt calling checking status of medication refill requested on 01/30/25 for medications,  Omeprazole  20 mg Metformin  500 mg  Per chart review, Request refused: Patient has requested refill too soon (both meds reordered Metfomin 180/1 RF  Omeprazole  10/20/24 90 1 RF))  [facility] #[id number] - [place], [place] - [address] [address] AT [facility] OF [address] [place] [address] Phone: [phone] Fax: [phone]  Pt informed of above, advised to contact pharmacy as refills are on both medications that were written for 90 day supply. Pt verbalized understanding, will contact pharmacy.  Sending CRM to CAL for visibility.

## 2025-02-03 NOTE — Addendum Note (Signed)
 Addended by: Anchor Dwan M on: 02/03/2025 12:25 PM   Modules accepted: Orders

## 2025-04-18 ENCOUNTER — Ambulatory Visit: Admitting: Nurse Practitioner

## 2025-06-07 ENCOUNTER — Encounter: Admitting: Family

## 2025-06-13 ENCOUNTER — Ambulatory Visit
